# Patient Record
Sex: Male | Born: 1945
Health system: Southern US, Community
[De-identification: ages and names within clinical notes are randomized; demographics above are authoritative.]

## PROBLEM LIST (undated history)

## (undated) DIAGNOSIS — K589 Irritable bowel syndrome without diarrhea: Secondary | ICD-10-CM

## (undated) DIAGNOSIS — I10 Essential (primary) hypertension: Secondary | ICD-10-CM

## (undated) DIAGNOSIS — I2510 Atherosclerotic heart disease of native coronary artery without angina pectoris: Secondary | ICD-10-CM

## (undated) DIAGNOSIS — D509 Iron deficiency anemia, unspecified: Secondary | ICD-10-CM

## (undated) DIAGNOSIS — E079 Disorder of thyroid, unspecified: Secondary | ICD-10-CM

## (undated) DIAGNOSIS — K219 Gastro-esophageal reflux disease without esophagitis: Secondary | ICD-10-CM

## (undated) DIAGNOSIS — Z8612 Personal history of poliomyelitis: Secondary | ICD-10-CM

## (undated) DIAGNOSIS — M1990 Unspecified osteoarthritis, unspecified site: Secondary | ICD-10-CM

## (undated) DIAGNOSIS — E119 Type 2 diabetes mellitus without complications: Secondary | ICD-10-CM

## (undated) DIAGNOSIS — N289 Disorder of kidney and ureter, unspecified: Secondary | ICD-10-CM

## (undated) DIAGNOSIS — I219 Acute myocardial infarction, unspecified: Secondary | ICD-10-CM

## (undated) HISTORY — PX: HYDROCELE EXCISION / REPAIR: SUR1145

## (undated) HISTORY — PX: TONSILLECTOMY: SUR1361

## (undated) HISTORY — PX: CYSTOSCOPY WITH INSERTION OF UROLIFT: SHX6678

## (undated) HISTORY — DX: Iron deficiency anemia, unspecified: D50.9

## (undated) HISTORY — PX: JOINT REPLACEMENT: SHX530

## (undated) HISTORY — PX: BACK SURGERY: SHX140

## (undated) HISTORY — PX: HERNIA REPAIR: SHX51

---

## 1965-10-07 HISTORY — PX: BACK SURGERY: SHX140

## 2002-10-07 HISTORY — PX: OTHER SURGICAL HISTORY: SHX169

## 2005-10-04 HISTORY — PX: BACK SURGERY: SHX140

## 2006-10-07 HISTORY — PX: CORONARY ARTERY BYPASS GRAFT: SHX141

## 2007-11-12 ENCOUNTER — Encounter: Payer: Self-pay | Admitting: Cardiovascular Disease

## 2007-12-06 ENCOUNTER — Encounter: Payer: Self-pay | Admitting: Cardiovascular Disease

## 2008-01-06 ENCOUNTER — Encounter: Payer: Self-pay | Admitting: Cardiovascular Disease

## 2008-02-05 ENCOUNTER — Encounter: Payer: Self-pay | Admitting: Cardiovascular Disease

## 2010-04-07 ENCOUNTER — Ambulatory Visit: Payer: Self-pay | Admitting: Physician Assistant

## 2010-08-04 ENCOUNTER — Ambulatory Visit: Payer: Self-pay

## 2011-04-08 ENCOUNTER — Ambulatory Visit: Payer: Self-pay | Admitting: Family Medicine

## 2011-05-28 DIAGNOSIS — M545 Low back pain, unspecified: Secondary | ICD-10-CM | POA: Insufficient documentation

## 2011-05-28 DIAGNOSIS — E119 Type 2 diabetes mellitus without complications: Secondary | ICD-10-CM | POA: Insufficient documentation

## 2011-05-28 DIAGNOSIS — M1990 Unspecified osteoarthritis, unspecified site: Secondary | ICD-10-CM | POA: Insufficient documentation

## 2011-05-28 DIAGNOSIS — R918 Other nonspecific abnormal finding of lung field: Secondary | ICD-10-CM | POA: Insufficient documentation

## 2011-05-28 DIAGNOSIS — E1159 Type 2 diabetes mellitus with other circulatory complications: Secondary | ICD-10-CM | POA: Insufficient documentation

## 2011-05-28 DIAGNOSIS — E785 Hyperlipidemia, unspecified: Secondary | ICD-10-CM | POA: Insufficient documentation

## 2011-05-28 DIAGNOSIS — G5603 Carpal tunnel syndrome, bilateral upper limbs: Secondary | ICD-10-CM | POA: Insufficient documentation

## 2011-05-28 DIAGNOSIS — E039 Hypothyroidism, unspecified: Secondary | ICD-10-CM | POA: Insufficient documentation

## 2011-05-28 DIAGNOSIS — I10 Essential (primary) hypertension: Secondary | ICD-10-CM | POA: Insufficient documentation

## 2011-05-28 DIAGNOSIS — I2510 Atherosclerotic heart disease of native coronary artery without angina pectoris: Secondary | ICD-10-CM | POA: Insufficient documentation

## 2012-02-11 DIAGNOSIS — N529 Male erectile dysfunction, unspecified: Secondary | ICD-10-CM | POA: Insufficient documentation

## 2012-02-11 DIAGNOSIS — N400 Enlarged prostate without lower urinary tract symptoms: Secondary | ICD-10-CM | POA: Insufficient documentation

## 2012-02-24 DIAGNOSIS — M19049 Primary osteoarthritis, unspecified hand: Secondary | ICD-10-CM | POA: Insufficient documentation

## 2012-07-29 DIAGNOSIS — M47812 Spondylosis without myelopathy or radiculopathy, cervical region: Secondary | ICD-10-CM | POA: Insufficient documentation

## 2012-08-12 DIAGNOSIS — Z96659 Presence of unspecified artificial knee joint: Secondary | ICD-10-CM | POA: Insufficient documentation

## 2012-08-12 DIAGNOSIS — M1710 Unilateral primary osteoarthritis, unspecified knee: Secondary | ICD-10-CM | POA: Insufficient documentation

## 2013-01-29 ENCOUNTER — Ambulatory Visit: Payer: Self-pay | Admitting: Family Medicine

## 2013-04-30 ENCOUNTER — Ambulatory Visit: Payer: Self-pay | Admitting: Emergency Medicine

## 2013-06-16 DIAGNOSIS — K4090 Unilateral inguinal hernia, without obstruction or gangrene, not specified as recurrent: Secondary | ICD-10-CM | POA: Insufficient documentation

## 2013-07-16 DIAGNOSIS — E66811 Obesity, class 1: Secondary | ICD-10-CM | POA: Insufficient documentation

## 2013-07-16 DIAGNOSIS — E669 Obesity, unspecified: Secondary | ICD-10-CM | POA: Insufficient documentation

## 2013-07-16 DIAGNOSIS — E6609 Other obesity due to excess calories: Secondary | ICD-10-CM | POA: Insufficient documentation

## 2013-08-19 ENCOUNTER — Ambulatory Visit: Payer: Self-pay

## 2013-10-11 DIAGNOSIS — F4321 Adjustment disorder with depressed mood: Secondary | ICD-10-CM | POA: Diagnosis not present

## 2013-10-13 DIAGNOSIS — M79609 Pain in unspecified limb: Secondary | ICD-10-CM | POA: Diagnosis not present

## 2013-10-13 DIAGNOSIS — L03039 Cellulitis of unspecified toe: Secondary | ICD-10-CM | POA: Diagnosis not present

## 2013-10-18 DIAGNOSIS — F4321 Adjustment disorder with depressed mood: Secondary | ICD-10-CM | POA: Diagnosis not present

## 2013-10-27 DIAGNOSIS — L03039 Cellulitis of unspecified toe: Secondary | ICD-10-CM | POA: Diagnosis not present

## 2013-11-02 DIAGNOSIS — F4321 Adjustment disorder with depressed mood: Secondary | ICD-10-CM | POA: Diagnosis not present

## 2013-11-11 DIAGNOSIS — F4321 Adjustment disorder with depressed mood: Secondary | ICD-10-CM | POA: Diagnosis not present

## 2013-11-19 DIAGNOSIS — I10 Essential (primary) hypertension: Secondary | ICD-10-CM | POA: Diagnosis not present

## 2013-11-19 DIAGNOSIS — I2510 Atherosclerotic heart disease of native coronary artery without angina pectoris: Secondary | ICD-10-CM | POA: Diagnosis not present

## 2013-11-29 DIAGNOSIS — F4321 Adjustment disorder with depressed mood: Secondary | ICD-10-CM | POA: Diagnosis not present

## 2013-12-06 DIAGNOSIS — F4321 Adjustment disorder with depressed mood: Secondary | ICD-10-CM | POA: Diagnosis not present

## 2013-12-08 DIAGNOSIS — E119 Type 2 diabetes mellitus without complications: Secondary | ICD-10-CM | POA: Diagnosis not present

## 2013-12-08 DIAGNOSIS — K594 Anal spasm: Secondary | ICD-10-CM | POA: Diagnosis not present

## 2013-12-08 DIAGNOSIS — E039 Hypothyroidism, unspecified: Secondary | ICD-10-CM | POA: Diagnosis not present

## 2013-12-09 DIAGNOSIS — M7710 Lateral epicondylitis, unspecified elbow: Secondary | ICD-10-CM | POA: Diagnosis not present

## 2013-12-13 DIAGNOSIS — F4321 Adjustment disorder with depressed mood: Secondary | ICD-10-CM | POA: Diagnosis not present

## 2013-12-14 DIAGNOSIS — M25529 Pain in unspecified elbow: Secondary | ICD-10-CM | POA: Diagnosis not present

## 2013-12-16 DIAGNOSIS — F4321 Adjustment disorder with depressed mood: Secondary | ICD-10-CM | POA: Diagnosis not present

## 2013-12-20 DIAGNOSIS — M25529 Pain in unspecified elbow: Secondary | ICD-10-CM | POA: Diagnosis not present

## 2013-12-20 DIAGNOSIS — F4321 Adjustment disorder with depressed mood: Secondary | ICD-10-CM | POA: Diagnosis not present

## 2013-12-26 ENCOUNTER — Emergency Department: Payer: Self-pay | Admitting: Emergency Medicine

## 2013-12-26 ENCOUNTER — Ambulatory Visit: Payer: Self-pay

## 2013-12-26 DIAGNOSIS — E119 Type 2 diabetes mellitus without complications: Secondary | ICD-10-CM | POA: Diagnosis not present

## 2013-12-26 DIAGNOSIS — S0990XA Unspecified injury of head, initial encounter: Secondary | ICD-10-CM | POA: Diagnosis not present

## 2013-12-26 DIAGNOSIS — S06330A Contusion and laceration of cerebrum, unspecified, without loss of consciousness, initial encounter: Secondary | ICD-10-CM | POA: Diagnosis not present

## 2013-12-26 DIAGNOSIS — I10 Essential (primary) hypertension: Secondary | ICD-10-CM | POA: Diagnosis not present

## 2013-12-26 DIAGNOSIS — Z79899 Other long term (current) drug therapy: Secondary | ICD-10-CM | POA: Diagnosis not present

## 2013-12-26 DIAGNOSIS — J069 Acute upper respiratory infection, unspecified: Secondary | ICD-10-CM | POA: Diagnosis not present

## 2013-12-29 DIAGNOSIS — R141 Gas pain: Secondary | ICD-10-CM | POA: Diagnosis not present

## 2013-12-29 DIAGNOSIS — K594 Anal spasm: Secondary | ICD-10-CM | POA: Diagnosis not present

## 2013-12-29 DIAGNOSIS — R143 Flatulence: Secondary | ICD-10-CM | POA: Diagnosis not present

## 2013-12-29 DIAGNOSIS — F4321 Adjustment disorder with depressed mood: Secondary | ICD-10-CM | POA: Diagnosis not present

## 2014-01-03 DIAGNOSIS — F4321 Adjustment disorder with depressed mood: Secondary | ICD-10-CM | POA: Diagnosis not present

## 2014-01-06 DIAGNOSIS — R7309 Other abnormal glucose: Secondary | ICD-10-CM | POA: Diagnosis not present

## 2014-01-06 DIAGNOSIS — E039 Hypothyroidism, unspecified: Secondary | ICD-10-CM | POA: Diagnosis not present

## 2014-01-06 DIAGNOSIS — E119 Type 2 diabetes mellitus without complications: Secondary | ICD-10-CM | POA: Diagnosis not present

## 2014-01-13 DIAGNOSIS — F4321 Adjustment disorder with depressed mood: Secondary | ICD-10-CM | POA: Diagnosis not present

## 2014-01-17 DIAGNOSIS — F4321 Adjustment disorder with depressed mood: Secondary | ICD-10-CM | POA: Diagnosis not present

## 2014-01-24 DIAGNOSIS — F4321 Adjustment disorder with depressed mood: Secondary | ICD-10-CM | POA: Diagnosis not present

## 2014-01-28 DIAGNOSIS — R109 Unspecified abdominal pain: Secondary | ICD-10-CM | POA: Diagnosis not present

## 2014-01-28 DIAGNOSIS — B351 Tinea unguium: Secondary | ICD-10-CM | POA: Diagnosis not present

## 2014-01-31 DIAGNOSIS — F4321 Adjustment disorder with depressed mood: Secondary | ICD-10-CM | POA: Diagnosis not present

## 2014-02-08 DIAGNOSIS — R141 Gas pain: Secondary | ICD-10-CM | POA: Diagnosis not present

## 2014-02-08 DIAGNOSIS — R142 Eructation: Secondary | ICD-10-CM | POA: Diagnosis not present

## 2014-02-14 DIAGNOSIS — R141 Gas pain: Secondary | ICD-10-CM | POA: Diagnosis not present

## 2014-02-14 DIAGNOSIS — F4321 Adjustment disorder with depressed mood: Secondary | ICD-10-CM | POA: Diagnosis not present

## 2014-02-15 DIAGNOSIS — E119 Type 2 diabetes mellitus without complications: Secondary | ICD-10-CM | POA: Diagnosis not present

## 2014-02-15 DIAGNOSIS — H2510 Age-related nuclear cataract, unspecified eye: Secondary | ICD-10-CM | POA: Diagnosis not present

## 2014-02-21 DIAGNOSIS — F4321 Adjustment disorder with depressed mood: Secondary | ICD-10-CM | POA: Diagnosis not present

## 2014-02-23 DIAGNOSIS — A048 Other specified bacterial intestinal infections: Secondary | ICD-10-CM | POA: Diagnosis not present

## 2014-02-23 DIAGNOSIS — H0289 Other specified disorders of eyelid: Secondary | ICD-10-CM | POA: Diagnosis not present

## 2014-02-23 DIAGNOSIS — E119 Type 2 diabetes mellitus without complications: Secondary | ICD-10-CM | POA: Diagnosis not present

## 2014-03-07 DIAGNOSIS — F4321 Adjustment disorder with depressed mood: Secondary | ICD-10-CM | POA: Diagnosis not present

## 2014-03-14 DIAGNOSIS — F4321 Adjustment disorder with depressed mood: Secondary | ICD-10-CM | POA: Diagnosis not present

## 2014-03-18 DIAGNOSIS — B351 Tinea unguium: Secondary | ICD-10-CM | POA: Diagnosis not present

## 2014-03-18 DIAGNOSIS — L218 Other seborrheic dermatitis: Secondary | ICD-10-CM | POA: Diagnosis not present

## 2014-03-18 DIAGNOSIS — E119 Type 2 diabetes mellitus without complications: Secondary | ICD-10-CM | POA: Diagnosis not present

## 2014-03-24 DIAGNOSIS — F4321 Adjustment disorder with depressed mood: Secondary | ICD-10-CM | POA: Diagnosis not present

## 2014-03-28 DIAGNOSIS — F4321 Adjustment disorder with depressed mood: Secondary | ICD-10-CM | POA: Diagnosis not present

## 2014-04-04 DIAGNOSIS — F4321 Adjustment disorder with depressed mood: Secondary | ICD-10-CM | POA: Diagnosis not present

## 2014-04-11 DIAGNOSIS — F4321 Adjustment disorder with depressed mood: Secondary | ICD-10-CM | POA: Diagnosis not present

## 2014-04-19 DIAGNOSIS — M545 Low back pain, unspecified: Secondary | ICD-10-CM | POA: Diagnosis not present

## 2014-04-19 DIAGNOSIS — R52 Pain, unspecified: Secondary | ICD-10-CM | POA: Diagnosis not present

## 2014-04-19 DIAGNOSIS — M546 Pain in thoracic spine: Secondary | ICD-10-CM | POA: Diagnosis not present

## 2014-04-19 DIAGNOSIS — M4120 Other idiopathic scoliosis, site unspecified: Secondary | ICD-10-CM | POA: Diagnosis not present

## 2014-04-19 DIAGNOSIS — M47812 Spondylosis without myelopathy or radiculopathy, cervical region: Secondary | ICD-10-CM | POA: Diagnosis not present

## 2014-04-19 DIAGNOSIS — M542 Cervicalgia: Secondary | ICD-10-CM | POA: Diagnosis not present

## 2014-04-25 DIAGNOSIS — F4321 Adjustment disorder with depressed mood: Secondary | ICD-10-CM | POA: Diagnosis not present

## 2014-04-29 ENCOUNTER — Ambulatory Visit: Payer: Self-pay | Admitting: Physician Assistant

## 2014-04-29 DIAGNOSIS — M1990 Unspecified osteoarthritis, unspecified site: Secondary | ICD-10-CM | POA: Diagnosis not present

## 2014-04-29 DIAGNOSIS — I2510 Atherosclerotic heart disease of native coronary artery without angina pectoris: Secondary | ICD-10-CM | POA: Diagnosis not present

## 2014-04-29 DIAGNOSIS — M7989 Other specified soft tissue disorders: Secondary | ICD-10-CM | POA: Diagnosis not present

## 2014-04-29 DIAGNOSIS — E039 Hypothyroidism, unspecified: Secondary | ICD-10-CM | POA: Diagnosis not present

## 2014-04-29 DIAGNOSIS — E669 Obesity, unspecified: Secondary | ICD-10-CM | POA: Diagnosis not present

## 2014-04-29 DIAGNOSIS — Z79899 Other long term (current) drug therapy: Secondary | ICD-10-CM | POA: Diagnosis not present

## 2014-04-29 DIAGNOSIS — E785 Hyperlipidemia, unspecified: Secondary | ICD-10-CM | POA: Diagnosis not present

## 2014-04-29 DIAGNOSIS — R609 Edema, unspecified: Secondary | ICD-10-CM | POA: Diagnosis not present

## 2014-04-29 DIAGNOSIS — D649 Anemia, unspecified: Secondary | ICD-10-CM | POA: Diagnosis not present

## 2014-04-29 DIAGNOSIS — R0602 Shortness of breath: Secondary | ICD-10-CM | POA: Diagnosis not present

## 2014-04-29 LAB — CBC WITH DIFFERENTIAL/PLATELET
Basophil #: 0.1 10*3/uL (ref 0.0–0.1)
Basophil %: 1.1 %
Eosinophil #: 0.4 10*3/uL (ref 0.0–0.7)
Eosinophil %: 8.1 %
HCT: 34.2 % — ABNORMAL LOW (ref 40.0–52.0)
HGB: 11.2 g/dL — AB (ref 13.0–18.0)
Lymphocyte #: 1.3 10*3/uL (ref 1.0–3.6)
Lymphocyte %: 25.5 %
MCH: 26.9 pg (ref 26.0–34.0)
MCHC: 32.7 g/dL (ref 32.0–36.0)
MCV: 82 fL (ref 80–100)
MONOS PCT: 7.2 %
Monocyte #: 0.4 x10 3/mm (ref 0.2–1.0)
Neutrophil #: 3.1 10*3/uL (ref 1.4–6.5)
Neutrophil %: 58.1 %
Platelet: 209 10*3/uL (ref 150–440)
RBC: 4.15 10*6/uL — ABNORMAL LOW (ref 4.40–5.90)
RDW: 16.4 % — AB (ref 11.5–14.5)
WBC: 5.3 10*3/uL (ref 3.8–10.6)

## 2014-04-29 LAB — BASIC METABOLIC PANEL
ANION GAP: 9 (ref 7–16)
BUN: 16 mg/dL (ref 7–18)
CALCIUM: 9.2 mg/dL (ref 8.5–10.1)
CO2: 27 mmol/L (ref 21–32)
CREATININE: 1.14 mg/dL (ref 0.60–1.30)
Chloride: 103 mmol/L (ref 98–107)
EGFR (African American): >60
EGFR (Non-African Amer.): >60
Glucose: 111 mg/dL — ABNORMAL HIGH (ref 65–99)
Osmolality: 279 (ref 275–301)
Potassium: 4.2 mmol/L (ref 3.5–5.1)
SODIUM: 139 mmol/L (ref 136–145)

## 2014-04-29 LAB — URINALYSIS, COMPLETE
Bacteria: NEGATIVE
Bilirubin,UR: NEGATIVE
Blood: NEGATIVE
Glucose,UR: NEGATIVE mg/dL (ref 0–75)
KETONE: NEGATIVE
NITRITE: NEGATIVE
Ph: 8 (ref 4.5–8.0)
Protein: NEGATIVE
SPECIFIC GRAVITY: 1.015 (ref 1.003–1.030)

## 2014-04-29 LAB — T4, FREE: Free Thyroxine: 0.91 ng/dL (ref 0.76–1.46)

## 2014-04-29 LAB — PRO B NATRIURETIC PEPTIDE: B-TYPE NATIURETIC PEPTID: 159 pg/mL — AB (ref 0–125)

## 2014-04-29 LAB — TSH: Thyroid Stimulating Horm: 4.84 u[IU]/mL — ABNORMAL HIGH

## 2014-05-02 DIAGNOSIS — M549 Dorsalgia, unspecified: Secondary | ICD-10-CM | POA: Diagnosis not present

## 2014-05-02 DIAGNOSIS — F4321 Adjustment disorder with depressed mood: Secondary | ICD-10-CM | POA: Diagnosis not present

## 2014-05-02 DIAGNOSIS — M47812 Spondylosis without myelopathy or radiculopathy, cervical region: Secondary | ICD-10-CM | POA: Diagnosis not present

## 2014-05-04 DIAGNOSIS — Z23 Encounter for immunization: Secondary | ICD-10-CM | POA: Diagnosis not present

## 2014-05-04 DIAGNOSIS — E039 Hypothyroidism, unspecified: Secondary | ICD-10-CM | POA: Diagnosis not present

## 2014-05-04 DIAGNOSIS — R609 Edema, unspecified: Secondary | ICD-10-CM | POA: Diagnosis not present

## 2014-05-04 DIAGNOSIS — E119 Type 2 diabetes mellitus without complications: Secondary | ICD-10-CM | POA: Diagnosis not present

## 2014-05-04 DIAGNOSIS — T148XXA Other injury of unspecified body region, initial encounter: Secondary | ICD-10-CM | POA: Diagnosis not present

## 2014-05-09 DIAGNOSIS — F4321 Adjustment disorder with depressed mood: Secondary | ICD-10-CM | POA: Diagnosis not present

## 2014-05-11 DIAGNOSIS — M4712 Other spondylosis with myelopathy, cervical region: Secondary | ICD-10-CM | POA: Diagnosis not present

## 2014-05-16 DIAGNOSIS — F4321 Adjustment disorder with depressed mood: Secondary | ICD-10-CM | POA: Diagnosis not present

## 2014-05-23 DIAGNOSIS — I2510 Atherosclerotic heart disease of native coronary artery without angina pectoris: Secondary | ICD-10-CM | POA: Diagnosis not present

## 2014-05-23 DIAGNOSIS — I10 Essential (primary) hypertension: Secondary | ICD-10-CM | POA: Diagnosis not present

## 2014-05-23 DIAGNOSIS — F4321 Adjustment disorder with depressed mood: Secondary | ICD-10-CM | POA: Diagnosis not present

## 2014-05-30 DIAGNOSIS — F4321 Adjustment disorder with depressed mood: Secondary | ICD-10-CM | POA: Diagnosis not present

## 2014-06-06 DIAGNOSIS — F4321 Adjustment disorder with depressed mood: Secondary | ICD-10-CM | POA: Diagnosis not present

## 2014-06-20 DIAGNOSIS — F4321 Adjustment disorder with depressed mood: Secondary | ICD-10-CM | POA: Diagnosis not present

## 2014-06-22 DIAGNOSIS — M79609 Pain in unspecified limb: Secondary | ICD-10-CM | POA: Diagnosis not present

## 2014-06-22 DIAGNOSIS — E119 Type 2 diabetes mellitus without complications: Secondary | ICD-10-CM | POA: Diagnosis not present

## 2014-06-22 DIAGNOSIS — L03039 Cellulitis of unspecified toe: Secondary | ICD-10-CM | POA: Diagnosis not present

## 2014-06-22 DIAGNOSIS — B351 Tinea unguium: Secondary | ICD-10-CM | POA: Diagnosis not present

## 2014-06-29 DIAGNOSIS — K648 Other hemorrhoids: Secondary | ICD-10-CM | POA: Diagnosis not present

## 2014-06-29 DIAGNOSIS — Z1211 Encounter for screening for malignant neoplasm of colon: Secondary | ICD-10-CM | POA: Diagnosis not present

## 2014-06-29 DIAGNOSIS — Z79899 Other long term (current) drug therapy: Secondary | ICD-10-CM | POA: Diagnosis not present

## 2014-06-29 DIAGNOSIS — D126 Benign neoplasm of colon, unspecified: Secondary | ICD-10-CM | POA: Diagnosis not present

## 2014-06-29 DIAGNOSIS — K5730 Diverticulosis of large intestine without perforation or abscess without bleeding: Secondary | ICD-10-CM | POA: Diagnosis not present

## 2014-06-29 DIAGNOSIS — I10 Essential (primary) hypertension: Secondary | ICD-10-CM | POA: Diagnosis not present

## 2014-06-29 DIAGNOSIS — K6289 Other specified diseases of anus and rectum: Secondary | ICD-10-CM | POA: Diagnosis not present

## 2014-06-29 DIAGNOSIS — K594 Anal spasm: Secondary | ICD-10-CM | POA: Diagnosis not present

## 2014-06-29 DIAGNOSIS — E119 Type 2 diabetes mellitus without complications: Secondary | ICD-10-CM | POA: Diagnosis not present

## 2014-06-30 DIAGNOSIS — F4321 Adjustment disorder with depressed mood: Secondary | ICD-10-CM | POA: Diagnosis not present

## 2014-07-18 DIAGNOSIS — F4321 Adjustment disorder with depressed mood: Secondary | ICD-10-CM | POA: Diagnosis not present

## 2014-07-25 DIAGNOSIS — F4321 Adjustment disorder with depressed mood: Secondary | ICD-10-CM | POA: Diagnosis not present

## 2014-08-01 DIAGNOSIS — F4321 Adjustment disorder with depressed mood: Secondary | ICD-10-CM | POA: Diagnosis not present

## 2014-08-05 DIAGNOSIS — M542 Cervicalgia: Secondary | ICD-10-CM | POA: Diagnosis not present

## 2014-08-08 DIAGNOSIS — I10 Essential (primary) hypertension: Secondary | ICD-10-CM | POA: Diagnosis not present

## 2014-08-08 DIAGNOSIS — F4321 Adjustment disorder with depressed mood: Secondary | ICD-10-CM | POA: Diagnosis not present

## 2014-08-08 DIAGNOSIS — B009 Herpesviral infection, unspecified: Secondary | ICD-10-CM | POA: Diagnosis not present

## 2014-08-08 DIAGNOSIS — Z23 Encounter for immunization: Secondary | ICD-10-CM | POA: Diagnosis not present

## 2014-08-15 DIAGNOSIS — F4321 Adjustment disorder with depressed mood: Secondary | ICD-10-CM | POA: Diagnosis not present

## 2014-08-15 DIAGNOSIS — M47816 Spondylosis without myelopathy or radiculopathy, lumbar region: Secondary | ICD-10-CM | POA: Diagnosis not present

## 2014-08-15 DIAGNOSIS — M47812 Spondylosis without myelopathy or radiculopathy, cervical region: Secondary | ICD-10-CM | POA: Diagnosis not present

## 2014-08-17 DIAGNOSIS — E031 Congenital hypothyroidism without goiter: Secondary | ICD-10-CM | POA: Diagnosis not present

## 2014-08-17 DIAGNOSIS — E1165 Type 2 diabetes mellitus with hyperglycemia: Secondary | ICD-10-CM | POA: Diagnosis not present

## 2014-08-17 DIAGNOSIS — Z794 Long term (current) use of insulin: Secondary | ICD-10-CM | POA: Diagnosis not present

## 2014-08-17 DIAGNOSIS — Z79899 Other long term (current) drug therapy: Secondary | ICD-10-CM | POA: Diagnosis not present

## 2014-08-17 DIAGNOSIS — I10 Essential (primary) hypertension: Secondary | ICD-10-CM | POA: Diagnosis not present

## 2014-08-17 DIAGNOSIS — E119 Type 2 diabetes mellitus without complications: Secondary | ICD-10-CM | POA: Diagnosis not present

## 2014-08-17 DIAGNOSIS — E039 Hypothyroidism, unspecified: Secondary | ICD-10-CM | POA: Diagnosis not present

## 2014-08-17 DIAGNOSIS — E785 Hyperlipidemia, unspecified: Secondary | ICD-10-CM | POA: Diagnosis not present

## 2014-08-18 DIAGNOSIS — E7849 Other hyperlipidemia: Secondary | ICD-10-CM | POA: Insufficient documentation

## 2014-08-22 DIAGNOSIS — F4321 Adjustment disorder with depressed mood: Secondary | ICD-10-CM | POA: Diagnosis not present

## 2014-08-29 DIAGNOSIS — F4321 Adjustment disorder with depressed mood: Secondary | ICD-10-CM | POA: Diagnosis not present

## 2014-08-31 DIAGNOSIS — M542 Cervicalgia: Secondary | ICD-10-CM | POA: Diagnosis not present

## 2014-09-05 DIAGNOSIS — F4321 Adjustment disorder with depressed mood: Secondary | ICD-10-CM | POA: Diagnosis not present

## 2014-09-12 DIAGNOSIS — F4321 Adjustment disorder with depressed mood: Secondary | ICD-10-CM | POA: Diagnosis not present

## 2014-09-14 DIAGNOSIS — M542 Cervicalgia: Secondary | ICD-10-CM | POA: Diagnosis not present

## 2014-09-26 DIAGNOSIS — F4321 Adjustment disorder with depressed mood: Secondary | ICD-10-CM | POA: Diagnosis not present

## 2014-09-28 DIAGNOSIS — M542 Cervicalgia: Secondary | ICD-10-CM | POA: Diagnosis not present

## 2014-10-03 DIAGNOSIS — F4321 Adjustment disorder with depressed mood: Secondary | ICD-10-CM | POA: Diagnosis not present

## 2014-10-10 DIAGNOSIS — F4321 Adjustment disorder with depressed mood: Secondary | ICD-10-CM | POA: Diagnosis not present

## 2014-10-17 DIAGNOSIS — F4321 Adjustment disorder with depressed mood: Secondary | ICD-10-CM | POA: Diagnosis not present

## 2014-11-03 DIAGNOSIS — F4321 Adjustment disorder with depressed mood: Secondary | ICD-10-CM | POA: Diagnosis not present

## 2014-11-09 DIAGNOSIS — F4321 Adjustment disorder with depressed mood: Secondary | ICD-10-CM | POA: Diagnosis not present

## 2014-11-14 IMAGING — CT CT HEAD WITHOUT CONTRAST
2 series · 13 of 30 positions shown, 15 images · non-contrast
Comparison: None.

CLINICAL DATA: Fall

EXAM:
CT HEAD WITHOUT CONTRAST
TECHNIQUE: Contiguous axial images were obtained from the base of the skull
through the vertex without intravenous contrast.

[Series 2: head wo · axial · 0.43mm/px · z∈[-121,-27]mm · 5 of 33 slices shown, 7 images]
[im 6/33  brain]
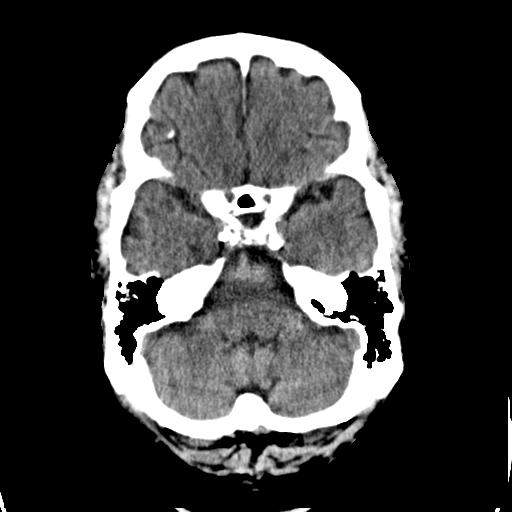
[im 6/33  bone]
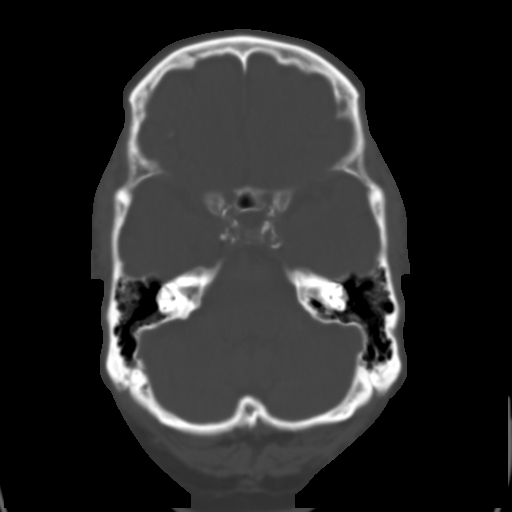
[im 11/33  brain]
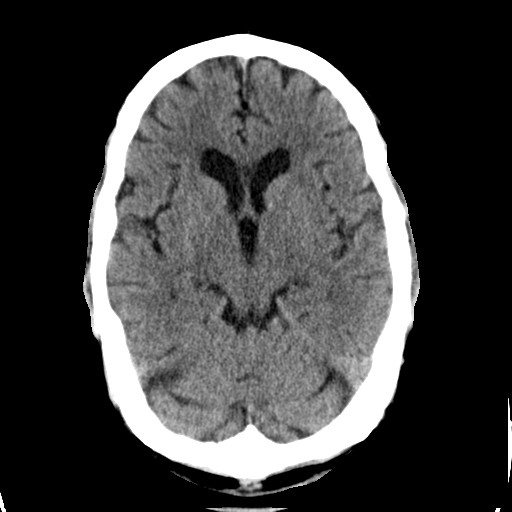
[im 17/33  brain]
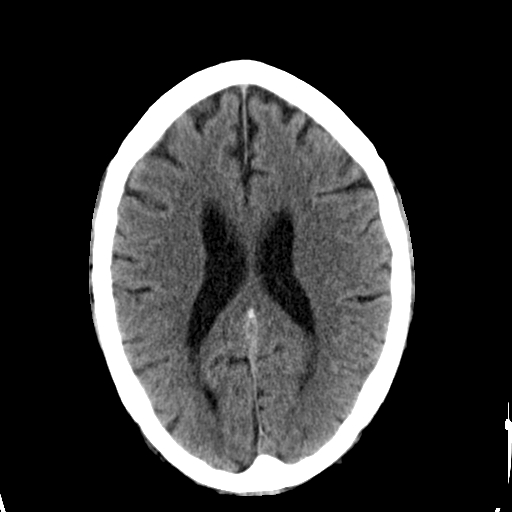
[im 22/33  brain]
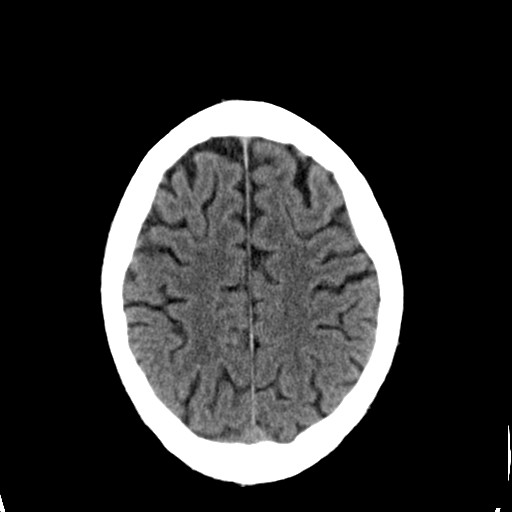
[im 27/33  brain]
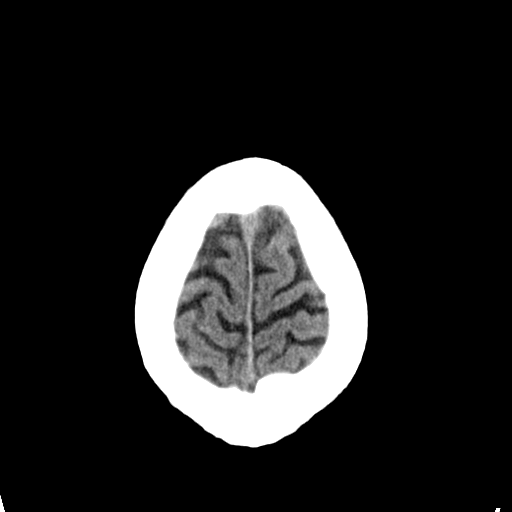
[im 27/33  bone]
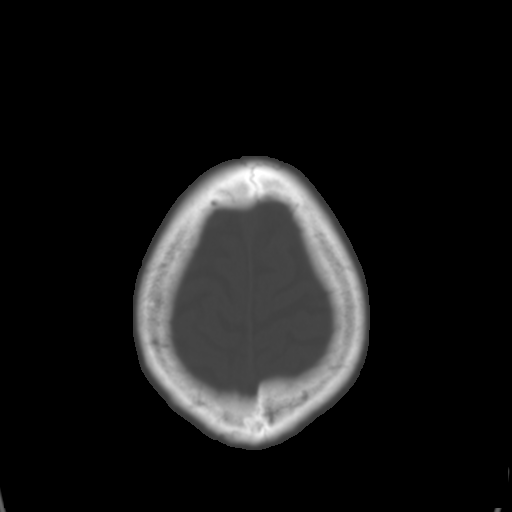

[Series 3: head bone · axial · 0.43mm/px · z∈[-131,-9]mm · 8 of 102 slices shown]
[im 10/102  bone]
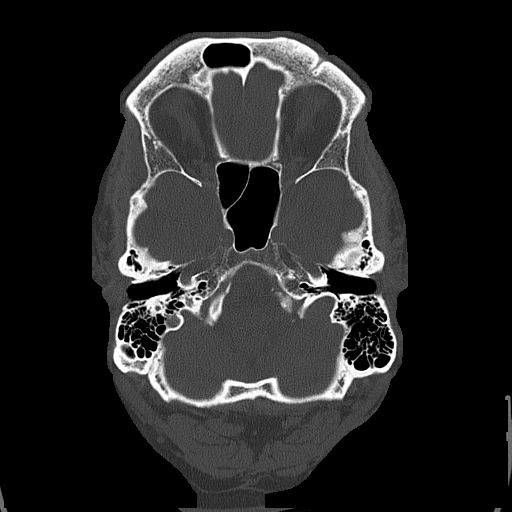
[im 19/102  bone]
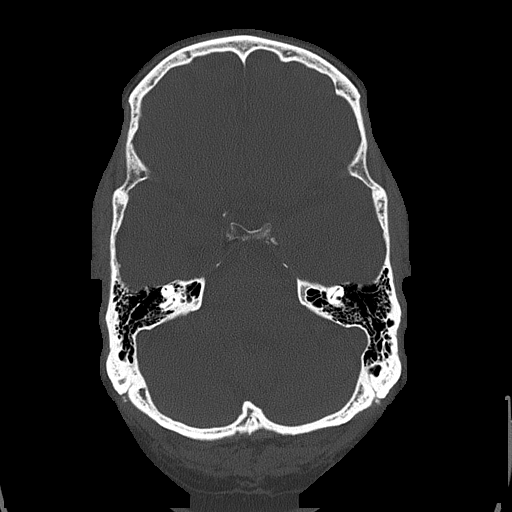
[im 33/102  bone]
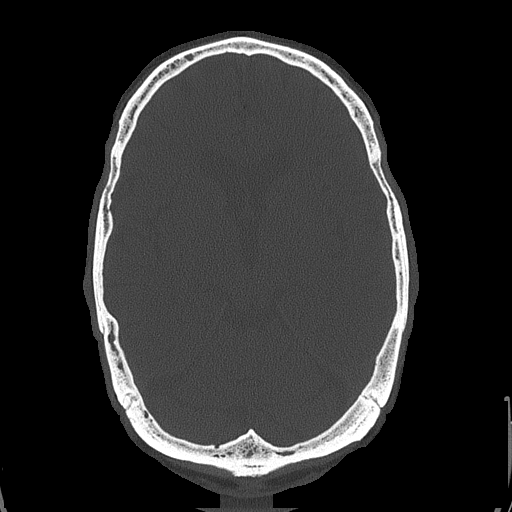
[im 46/102  bone]
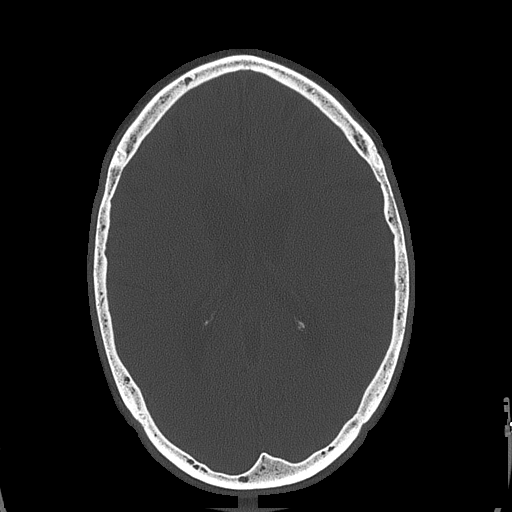
[im 56/102  bone]
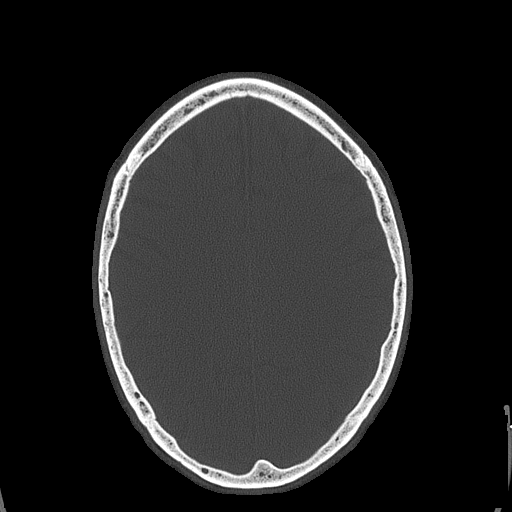
[im 69/102  bone]
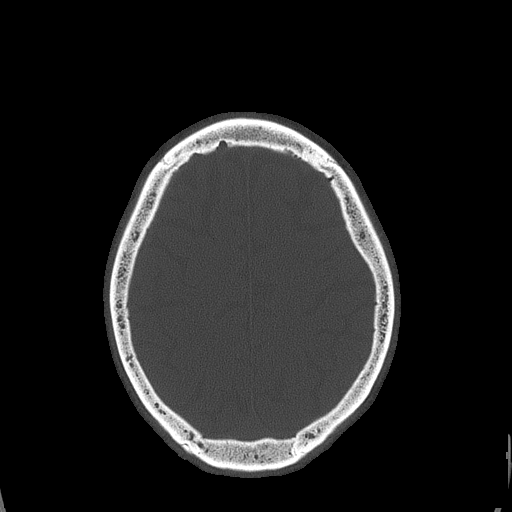
[im 83/102  bone]
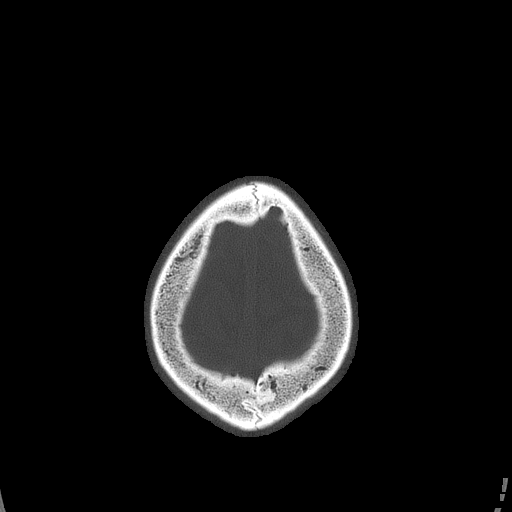
[im 92/102  bone]
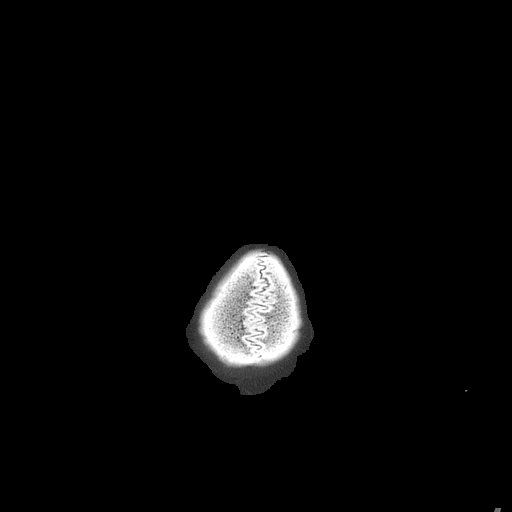

[13 of 30 positions shown; findings below may reference images not displayed]

FINDINGS: Global atrophy appropriate for age. Mild chronic ischemic changes in
the periventricular white matter and external capsules. No mass
effect, midline shift, or acute intracranial hemorrhage. Cranium is
intact. Mucosal thickening in the ethmoid air cells is noted.
IMPRESSION: No acute intracranial pathology.  Chronic changes are noted.

## 2014-11-16 DIAGNOSIS — F4321 Adjustment disorder with depressed mood: Secondary | ICD-10-CM | POA: Diagnosis not present

## 2014-11-21 DIAGNOSIS — Z87891 Personal history of nicotine dependence: Secondary | ICD-10-CM | POA: Diagnosis not present

## 2014-11-21 DIAGNOSIS — E1165 Type 2 diabetes mellitus with hyperglycemia: Secondary | ICD-10-CM | POA: Diagnosis not present

## 2014-11-21 DIAGNOSIS — Z79899 Other long term (current) drug therapy: Secondary | ICD-10-CM | POA: Diagnosis not present

## 2014-11-21 DIAGNOSIS — Z7982 Long term (current) use of aspirin: Secondary | ICD-10-CM | POA: Diagnosis not present

## 2014-11-23 DIAGNOSIS — E785 Hyperlipidemia, unspecified: Secondary | ICD-10-CM | POA: Diagnosis not present

## 2014-11-23 DIAGNOSIS — I2510 Atherosclerotic heart disease of native coronary artery without angina pectoris: Secondary | ICD-10-CM | POA: Diagnosis not present

## 2014-11-23 DIAGNOSIS — R5383 Other fatigue: Secondary | ICD-10-CM | POA: Diagnosis not present

## 2014-11-30 DIAGNOSIS — R05 Cough: Secondary | ICD-10-CM | POA: Diagnosis not present

## 2014-12-01 DIAGNOSIS — R1031 Right lower quadrant pain: Secondary | ICD-10-CM | POA: Diagnosis not present

## 2014-12-01 DIAGNOSIS — K6389 Other specified diseases of intestine: Secondary | ICD-10-CM | POA: Diagnosis not present

## 2014-12-01 DIAGNOSIS — R140 Abdominal distension (gaseous): Secondary | ICD-10-CM | POA: Diagnosis not present

## 2014-12-05 DIAGNOSIS — F4321 Adjustment disorder with depressed mood: Secondary | ICD-10-CM | POA: Diagnosis not present

## 2014-12-12 DIAGNOSIS — F4321 Adjustment disorder with depressed mood: Secondary | ICD-10-CM | POA: Diagnosis not present

## 2014-12-16 DIAGNOSIS — R140 Abdominal distension (gaseous): Secondary | ICD-10-CM | POA: Diagnosis not present

## 2014-12-16 DIAGNOSIS — K8020 Calculus of gallbladder without cholecystitis without obstruction: Secondary | ICD-10-CM | POA: Diagnosis not present

## 2014-12-16 DIAGNOSIS — R1031 Right lower quadrant pain: Secondary | ICD-10-CM | POA: Diagnosis not present

## 2014-12-19 DIAGNOSIS — F4321 Adjustment disorder with depressed mood: Secondary | ICD-10-CM | POA: Diagnosis not present

## 2014-12-30 DIAGNOSIS — F4321 Adjustment disorder with depressed mood: Secondary | ICD-10-CM | POA: Diagnosis not present

## 2015-01-02 DIAGNOSIS — F4321 Adjustment disorder with depressed mood: Secondary | ICD-10-CM | POA: Diagnosis not present

## 2015-01-05 ENCOUNTER — Ambulatory Visit
Admit: 2015-01-05 | Disposition: A | Discharge status: Home / Self Care | Payer: Self-pay | Attending: Family Medicine | Admitting: Family Medicine

## 2015-01-05 DIAGNOSIS — E780 Pure hypercholesterolemia: Secondary | ICD-10-CM | POA: Diagnosis not present

## 2015-01-05 DIAGNOSIS — M19042 Primary osteoarthritis, left hand: Secondary | ICD-10-CM | POA: Diagnosis not present

## 2015-01-05 DIAGNOSIS — E119 Type 2 diabetes mellitus without complications: Secondary | ICD-10-CM | POA: Diagnosis not present

## 2015-01-05 DIAGNOSIS — W208XXA Other cause of strike by thrown, projected or falling object, initial encounter: Secondary | ICD-10-CM | POA: Diagnosis not present

## 2015-01-05 DIAGNOSIS — Z951 Presence of aortocoronary bypass graft: Secondary | ICD-10-CM | POA: Diagnosis not present

## 2015-01-05 DIAGNOSIS — S3993XA Unspecified injury of pelvis, initial encounter: Secondary | ICD-10-CM | POA: Diagnosis not present

## 2015-01-05 DIAGNOSIS — Z87891 Personal history of nicotine dependence: Secondary | ICD-10-CM | POA: Diagnosis not present

## 2015-01-05 DIAGNOSIS — S300XXA Contusion of lower back and pelvis, initial encounter: Secondary | ICD-10-CM | POA: Diagnosis not present

## 2015-01-05 DIAGNOSIS — R079 Chest pain, unspecified: Secondary | ICD-10-CM | POA: Diagnosis not present

## 2015-01-05 DIAGNOSIS — Z794 Long term (current) use of insulin: Secondary | ICD-10-CM | POA: Diagnosis not present

## 2015-01-05 DIAGNOSIS — K219 Gastro-esophageal reflux disease without esophagitis: Secondary | ICD-10-CM | POA: Diagnosis not present

## 2015-01-05 DIAGNOSIS — M19041 Primary osteoarthritis, right hand: Secondary | ICD-10-CM | POA: Diagnosis not present

## 2015-01-05 DIAGNOSIS — Z981 Arthrodesis status: Secondary | ICD-10-CM | POA: Diagnosis not present

## 2015-01-05 DIAGNOSIS — G629 Polyneuropathy, unspecified: Secondary | ICD-10-CM | POA: Diagnosis not present

## 2015-01-05 DIAGNOSIS — S60929A Unspecified superficial injury of unspecified hand, initial encounter: Secondary | ICD-10-CM | POA: Diagnosis not present

## 2015-01-05 DIAGNOSIS — S20409A Unspecified superficial injuries of unspecified back wall of thorax, initial encounter: Secondary | ICD-10-CM | POA: Diagnosis not present

## 2015-01-05 DIAGNOSIS — Z79899 Other long term (current) drug therapy: Secondary | ICD-10-CM | POA: Diagnosis not present

## 2015-01-05 DIAGNOSIS — E079 Disorder of thyroid, unspecified: Secondary | ICD-10-CM | POA: Diagnosis not present

## 2015-01-05 DIAGNOSIS — S6992XA Unspecified injury of left wrist, hand and finger(s), initial encounter: Secondary | ICD-10-CM | POA: Diagnosis not present

## 2015-01-05 DIAGNOSIS — W19XXXA Unspecified fall, initial encounter: Secondary | ICD-10-CM | POA: Diagnosis not present

## 2015-01-05 DIAGNOSIS — S3992XA Unspecified injury of lower back, initial encounter: Secondary | ICD-10-CM | POA: Diagnosis not present

## 2015-01-05 DIAGNOSIS — M545 Low back pain: Secondary | ICD-10-CM | POA: Diagnosis not present

## 2015-01-05 DIAGNOSIS — S6991XA Unspecified injury of right wrist, hand and finger(s), initial encounter: Secondary | ICD-10-CM | POA: Diagnosis not present

## 2015-01-20 DIAGNOSIS — F4321 Adjustment disorder with depressed mood: Secondary | ICD-10-CM | POA: Diagnosis not present

## 2015-01-27 DIAGNOSIS — F4321 Adjustment disorder with depressed mood: Secondary | ICD-10-CM | POA: Diagnosis not present

## 2015-02-03 DIAGNOSIS — F4321 Adjustment disorder with depressed mood: Secondary | ICD-10-CM | POA: Diagnosis not present

## 2015-02-14 DIAGNOSIS — Z87448 Personal history of other diseases of urinary system: Secondary | ICD-10-CM | POA: Insufficient documentation

## 2015-02-14 DIAGNOSIS — N183 Chronic kidney disease, stage 3 (moderate): Secondary | ICD-10-CM | POA: Diagnosis not present

## 2015-02-14 DIAGNOSIS — F4321 Adjustment disorder with depressed mood: Secondary | ICD-10-CM | POA: Diagnosis not present

## 2015-02-14 DIAGNOSIS — I10 Essential (primary) hypertension: Secondary | ICD-10-CM | POA: Diagnosis not present

## 2015-02-14 DIAGNOSIS — M25552 Pain in left hip: Secondary | ICD-10-CM | POA: Diagnosis not present

## 2015-02-14 DIAGNOSIS — E039 Hypothyroidism, unspecified: Secondary | ICD-10-CM | POA: Diagnosis not present

## 2015-02-20 DIAGNOSIS — Z7982 Long term (current) use of aspirin: Secondary | ICD-10-CM | POA: Diagnosis not present

## 2015-02-20 DIAGNOSIS — E039 Hypothyroidism, unspecified: Secondary | ICD-10-CM | POA: Diagnosis not present

## 2015-02-20 DIAGNOSIS — E1165 Type 2 diabetes mellitus with hyperglycemia: Secondary | ICD-10-CM | POA: Diagnosis not present

## 2015-02-20 DIAGNOSIS — I252 Old myocardial infarction: Secondary | ICD-10-CM | POA: Diagnosis not present

## 2015-02-20 DIAGNOSIS — Z79899 Other long term (current) drug therapy: Secondary | ICD-10-CM | POA: Diagnosis not present

## 2015-02-20 DIAGNOSIS — E119 Type 2 diabetes mellitus without complications: Secondary | ICD-10-CM | POA: Diagnosis not present

## 2015-02-20 DIAGNOSIS — Z951 Presence of aortocoronary bypass graft: Secondary | ICD-10-CM | POA: Diagnosis not present

## 2015-02-20 DIAGNOSIS — Z87891 Personal history of nicotine dependence: Secondary | ICD-10-CM | POA: Diagnosis not present

## 2015-02-20 DIAGNOSIS — E785 Hyperlipidemia, unspecified: Secondary | ICD-10-CM | POA: Diagnosis not present

## 2015-02-20 DIAGNOSIS — I10 Essential (primary) hypertension: Secondary | ICD-10-CM | POA: Diagnosis not present

## 2015-02-20 DIAGNOSIS — Z794 Long term (current) use of insulin: Secondary | ICD-10-CM | POA: Diagnosis not present

## 2015-02-24 DIAGNOSIS — M25552 Pain in left hip: Secondary | ICD-10-CM | POA: Diagnosis not present

## 2015-02-24 DIAGNOSIS — M549 Dorsalgia, unspecified: Secondary | ICD-10-CM | POA: Diagnosis not present

## 2015-02-24 DIAGNOSIS — M47816 Spondylosis without myelopathy or radiculopathy, lumbar region: Secondary | ICD-10-CM | POA: Diagnosis not present

## 2015-02-24 DIAGNOSIS — Z981 Arthrodesis status: Secondary | ICD-10-CM | POA: Diagnosis not present

## 2015-02-24 DIAGNOSIS — M5136 Other intervertebral disc degeneration, lumbar region: Secondary | ICD-10-CM | POA: Diagnosis not present

## 2015-03-03 DIAGNOSIS — F4321 Adjustment disorder with depressed mood: Secondary | ICD-10-CM | POA: Diagnosis not present

## 2015-03-10 DIAGNOSIS — F4321 Adjustment disorder with depressed mood: Secondary | ICD-10-CM | POA: Diagnosis not present

## 2015-03-22 DIAGNOSIS — T1490 Injury, unspecified: Secondary | ICD-10-CM | POA: Diagnosis not present

## 2015-03-22 DIAGNOSIS — Z791 Long term (current) use of non-steroidal anti-inflammatories (NSAID): Secondary | ICD-10-CM | POA: Diagnosis not present

## 2015-03-22 DIAGNOSIS — N529 Male erectile dysfunction, unspecified: Secondary | ICD-10-CM | POA: Diagnosis not present

## 2015-03-22 DIAGNOSIS — M2550 Pain in unspecified joint: Secondary | ICD-10-CM | POA: Diagnosis not present

## 2015-03-22 DIAGNOSIS — Z87448 Personal history of other diseases of urinary system: Secondary | ICD-10-CM | POA: Diagnosis not present

## 2015-03-24 DIAGNOSIS — F4321 Adjustment disorder with depressed mood: Secondary | ICD-10-CM | POA: Diagnosis not present

## 2015-03-30 DIAGNOSIS — F4321 Adjustment disorder with depressed mood: Secondary | ICD-10-CM | POA: Diagnosis not present

## 2015-04-14 DIAGNOSIS — F4321 Adjustment disorder with depressed mood: Secondary | ICD-10-CM | POA: Diagnosis not present

## 2015-04-18 DIAGNOSIS — M79642 Pain in left hand: Secondary | ICD-10-CM | POA: Diagnosis not present

## 2015-04-18 DIAGNOSIS — M65331 Trigger finger, right middle finger: Secondary | ICD-10-CM | POA: Diagnosis not present

## 2015-04-18 DIAGNOSIS — M19042 Primary osteoarthritis, left hand: Secondary | ICD-10-CM | POA: Diagnosis not present

## 2015-04-18 DIAGNOSIS — M1812 Unilateral primary osteoarthritis of first carpometacarpal joint, left hand: Secondary | ICD-10-CM | POA: Diagnosis not present

## 2015-04-21 DIAGNOSIS — F4321 Adjustment disorder with depressed mood: Secondary | ICD-10-CM | POA: Diagnosis not present

## 2015-04-28 DIAGNOSIS — F4321 Adjustment disorder with depressed mood: Secondary | ICD-10-CM | POA: Diagnosis not present

## 2015-05-05 DIAGNOSIS — F4321 Adjustment disorder with depressed mood: Secondary | ICD-10-CM | POA: Diagnosis not present

## 2015-05-26 DIAGNOSIS — F4321 Adjustment disorder with depressed mood: Secondary | ICD-10-CM | POA: Diagnosis not present

## 2015-05-27 DIAGNOSIS — E119 Type 2 diabetes mellitus without complications: Secondary | ICD-10-CM | POA: Diagnosis not present

## 2015-05-27 DIAGNOSIS — T63441A Toxic effect of venom of bees, accidental (unintentional), initial encounter: Secondary | ICD-10-CM | POA: Diagnosis not present

## 2015-05-27 DIAGNOSIS — Z87891 Personal history of nicotine dependence: Secondary | ICD-10-CM | POA: Diagnosis not present

## 2015-05-27 DIAGNOSIS — L03113 Cellulitis of right upper limb: Secondary | ICD-10-CM | POA: Diagnosis not present

## 2015-05-27 DIAGNOSIS — Z981 Arthrodesis status: Secondary | ICD-10-CM | POA: Diagnosis not present

## 2015-05-27 DIAGNOSIS — K219 Gastro-esophageal reflux disease without esophagitis: Secondary | ICD-10-CM | POA: Diagnosis not present

## 2015-05-27 DIAGNOSIS — E780 Pure hypercholesterolemia: Secondary | ICD-10-CM | POA: Diagnosis not present

## 2015-06-02 DIAGNOSIS — F4321 Adjustment disorder with depressed mood: Secondary | ICD-10-CM | POA: Diagnosis not present

## 2015-06-07 DIAGNOSIS — R5383 Other fatigue: Secondary | ICD-10-CM | POA: Diagnosis not present

## 2015-06-07 DIAGNOSIS — E785 Hyperlipidemia, unspecified: Secondary | ICD-10-CM | POA: Diagnosis not present

## 2015-06-07 DIAGNOSIS — I2510 Atherosclerotic heart disease of native coronary artery without angina pectoris: Secondary | ICD-10-CM | POA: Diagnosis not present

## 2015-06-09 DIAGNOSIS — F4321 Adjustment disorder with depressed mood: Secondary | ICD-10-CM | POA: Diagnosis not present

## 2015-06-13 DIAGNOSIS — Z23 Encounter for immunization: Secondary | ICD-10-CM | POA: Diagnosis not present

## 2015-06-13 DIAGNOSIS — W19XXXA Unspecified fall, initial encounter: Secondary | ICD-10-CM | POA: Diagnosis not present

## 2015-06-13 DIAGNOSIS — M79645 Pain in left finger(s): Secondary | ICD-10-CM | POA: Diagnosis not present

## 2015-06-13 DIAGNOSIS — S62617A Displaced fracture of proximal phalanx of left little finger, initial encounter for closed fracture: Secondary | ICD-10-CM | POA: Diagnosis not present

## 2015-06-13 DIAGNOSIS — E039 Hypothyroidism, unspecified: Secondary | ICD-10-CM | POA: Diagnosis not present

## 2015-06-14 DIAGNOSIS — E031 Congenital hypothyroidism without goiter: Secondary | ICD-10-CM | POA: Diagnosis not present

## 2015-06-14 DIAGNOSIS — E1165 Type 2 diabetes mellitus with hyperglycemia: Secondary | ICD-10-CM | POA: Diagnosis not present

## 2015-06-16 DIAGNOSIS — F4321 Adjustment disorder with depressed mood: Secondary | ICD-10-CM | POA: Diagnosis not present

## 2015-06-20 DIAGNOSIS — S62657A Nondisplaced fracture of medial phalanx of left little finger, initial encounter for closed fracture: Secondary | ICD-10-CM | POA: Diagnosis not present

## 2015-06-21 ENCOUNTER — Ambulatory Visit: Payer: Medicare Other

## 2015-06-21 ENCOUNTER — Ambulatory Visit
Admission: EM | Admit: 2015-06-21 | Discharge: 2015-06-21 | Disposition: A | Discharge status: Home / Self Care | Payer: Medicare Other | Attending: Family Medicine | Admitting: Family Medicine

## 2015-06-21 DIAGNOSIS — X58XXXA Exposure to other specified factors, initial encounter: Secondary | ICD-10-CM | POA: Insufficient documentation

## 2015-06-21 DIAGNOSIS — S63501A Unspecified sprain of right wrist, initial encounter: Secondary | ICD-10-CM | POA: Diagnosis not present

## 2015-06-21 DIAGNOSIS — S6991XA Unspecified injury of right wrist, hand and finger(s), initial encounter: Secondary | ICD-10-CM | POA: Diagnosis not present

## 2015-06-21 DIAGNOSIS — M25531 Pain in right wrist: Secondary | ICD-10-CM | POA: Diagnosis present

## 2015-06-21 HISTORY — DX: Atherosclerotic heart disease of native coronary artery without angina pectoris: I25.10

## 2015-06-21 HISTORY — DX: Gastro-esophageal reflux disease without esophagitis: K21.9

## 2015-06-21 HISTORY — DX: Unspecified osteoarthritis, unspecified site: M19.90

## 2015-06-21 HISTORY — DX: Type 2 diabetes mellitus without complications: E11.9

## 2015-06-21 HISTORY — DX: Essential (primary) hypertension: I10

## 2015-06-21 HISTORY — DX: Disorder of thyroid, unspecified: E07.9

## 2015-06-21 NOTE — ED Provider Notes (Signed)
Patient presents today with history of right wrist pain with minimal swelling. Patient states that he was using a large drill for planting yesterday when it got stuck in the ground and twisted his wrist. Patient denies any other pain in the arm or shoulder. He denies falling on an outstretched hand. Patient denies any history of wrist fracture in the past. He did initially have some numbness in the fingers but that has resolved.  ROS: Negative except mentioned above. Vitals as per Epic.   GENERAL: NAD MSK: R Wrist-minimal swelling, general tenderness of wrist, no snuff box tenderness, ROM slightly limited with full extension and flexion, nv intact   NEURO: CN II-XII grossly intact   A/P: R Wrist Sprain, Osteoarthritis- Xrays show no acute fracture, there does appear to be changes related to osteoarthritis, I will treat patient for a wrist sprain at this time with a wrist splint to be used for a few days to a week, he is to ice the area when necessary, declines any prescription pain medicine, follow up with orthopedics if symptoms persist or worsen as discussed.   Paulina Fusi, MD 06/21/15 1302

## 2015-06-21 NOTE — ED Notes (Signed)
States using a drill yesterday and drill got stuck and torqued right wrist. C/o 8/10 wrist pain and numbness in fingers

## 2015-06-26 DIAGNOSIS — Z0389 Encounter for observation for other suspected diseases and conditions ruled out: Secondary | ICD-10-CM | POA: Diagnosis not present

## 2015-06-26 DIAGNOSIS — E1165 Type 2 diabetes mellitus with hyperglycemia: Secondary | ICD-10-CM | POA: Diagnosis not present

## 2015-06-26 DIAGNOSIS — H2513 Age-related nuclear cataract, bilateral: Secondary | ICD-10-CM | POA: Diagnosis not present

## 2015-06-30 DIAGNOSIS — F4321 Adjustment disorder with depressed mood: Secondary | ICD-10-CM | POA: Diagnosis not present

## 2015-07-07 DIAGNOSIS — F4321 Adjustment disorder with depressed mood: Secondary | ICD-10-CM | POA: Diagnosis not present

## 2015-07-14 DIAGNOSIS — F4321 Adjustment disorder with depressed mood: Secondary | ICD-10-CM | POA: Diagnosis not present

## 2015-07-20 DIAGNOSIS — F4321 Adjustment disorder with depressed mood: Secondary | ICD-10-CM | POA: Diagnosis not present

## 2015-07-28 DIAGNOSIS — F4321 Adjustment disorder with depressed mood: Secondary | ICD-10-CM | POA: Diagnosis not present

## 2015-08-04 DIAGNOSIS — F4321 Adjustment disorder with depressed mood: Secondary | ICD-10-CM | POA: Diagnosis not present

## 2015-08-11 DIAGNOSIS — F4321 Adjustment disorder with depressed mood: Secondary | ICD-10-CM | POA: Diagnosis not present

## 2015-08-14 DIAGNOSIS — E039 Hypothyroidism, unspecified: Secondary | ICD-10-CM | POA: Diagnosis not present

## 2015-08-14 DIAGNOSIS — I10 Essential (primary) hypertension: Secondary | ICD-10-CM | POA: Diagnosis not present

## 2015-08-25 DIAGNOSIS — F4321 Adjustment disorder with depressed mood: Secondary | ICD-10-CM | POA: Diagnosis not present

## 2015-09-08 DIAGNOSIS — F4321 Adjustment disorder with depressed mood: Secondary | ICD-10-CM | POA: Diagnosis not present

## 2015-09-15 DIAGNOSIS — F4321 Adjustment disorder with depressed mood: Secondary | ICD-10-CM | POA: Diagnosis not present

## 2015-09-20 DIAGNOSIS — N183 Chronic kidney disease, stage 3 (moderate): Secondary | ICD-10-CM | POA: Diagnosis not present

## 2015-09-20 DIAGNOSIS — E1122 Type 2 diabetes mellitus with diabetic chronic kidney disease: Secondary | ICD-10-CM | POA: Diagnosis not present

## 2015-09-20 DIAGNOSIS — Z791 Long term (current) use of non-steroidal anti-inflammatories (NSAID): Secondary | ICD-10-CM | POA: Diagnosis not present

## 2015-09-20 DIAGNOSIS — Z87448 Personal history of other diseases of urinary system: Secondary | ICD-10-CM | POA: Diagnosis not present

## 2015-09-20 DIAGNOSIS — E039 Hypothyroidism, unspecified: Secondary | ICD-10-CM | POA: Diagnosis not present

## 2015-09-20 DIAGNOSIS — I10 Essential (primary) hypertension: Secondary | ICD-10-CM | POA: Diagnosis not present

## 2015-09-20 DIAGNOSIS — M2550 Pain in unspecified joint: Secondary | ICD-10-CM | POA: Diagnosis not present

## 2015-09-22 DIAGNOSIS — F4321 Adjustment disorder with depressed mood: Secondary | ICD-10-CM | POA: Diagnosis not present

## 2015-10-02 ENCOUNTER — Encounter: Payer: Self-pay | Admitting: Emergency Medicine

## 2015-10-02 ENCOUNTER — Ambulatory Visit
Admission: EM | Admit: 2015-10-02 | Discharge: 2015-10-02 | Disposition: A | Discharge status: Home / Self Care | Payer: Medicare Other | Attending: Family Medicine | Admitting: Family Medicine

## 2015-10-02 DIAGNOSIS — Z79899 Other long term (current) drug therapy: Secondary | ICD-10-CM | POA: Diagnosis not present

## 2015-10-02 DIAGNOSIS — K219 Gastro-esophageal reflux disease without esophagitis: Secondary | ICD-10-CM | POA: Diagnosis not present

## 2015-10-02 DIAGNOSIS — Z7982 Long term (current) use of aspirin: Secondary | ICD-10-CM | POA: Insufficient documentation

## 2015-10-02 DIAGNOSIS — R079 Chest pain, unspecified: Secondary | ICD-10-CM | POA: Diagnosis not present

## 2015-10-02 DIAGNOSIS — I2510 Atherosclerotic heart disease of native coronary artery without angina pectoris: Secondary | ICD-10-CM | POA: Insufficient documentation

## 2015-10-02 DIAGNOSIS — Z87891 Personal history of nicotine dependence: Secondary | ICD-10-CM | POA: Diagnosis not present

## 2015-10-02 DIAGNOSIS — R112 Nausea with vomiting, unspecified: Secondary | ICD-10-CM | POA: Diagnosis not present

## 2015-10-02 DIAGNOSIS — Z7984 Long term (current) use of oral hypoglycemic drugs: Secondary | ICD-10-CM | POA: Insufficient documentation

## 2015-10-02 DIAGNOSIS — E079 Disorder of thyroid, unspecified: Secondary | ICD-10-CM | POA: Insufficient documentation

## 2015-10-02 DIAGNOSIS — E119 Type 2 diabetes mellitus without complications: Secondary | ICD-10-CM | POA: Insufficient documentation

## 2015-10-02 DIAGNOSIS — I10 Essential (primary) hypertension: Secondary | ICD-10-CM | POA: Insufficient documentation

## 2015-10-02 DIAGNOSIS — Z888 Allergy status to other drugs, medicaments and biological substances status: Secondary | ICD-10-CM | POA: Diagnosis not present

## 2015-10-02 DIAGNOSIS — M79602 Pain in left arm: Secondary | ICD-10-CM | POA: Diagnosis not present

## 2015-10-02 DIAGNOSIS — E7800 Pure hypercholesterolemia, unspecified: Secondary | ICD-10-CM | POA: Diagnosis not present

## 2015-10-02 DIAGNOSIS — Z981 Arthrodesis status: Secondary | ICD-10-CM | POA: Diagnosis not present

## 2015-10-02 DIAGNOSIS — E1140 Type 2 diabetes mellitus with diabetic neuropathy, unspecified: Secondary | ICD-10-CM | POA: Diagnosis not present

## 2015-10-02 DIAGNOSIS — M6281 Muscle weakness (generalized): Secondary | ICD-10-CM | POA: Diagnosis not present

## 2015-10-02 DIAGNOSIS — Z794 Long term (current) use of insulin: Secondary | ICD-10-CM | POA: Insufficient documentation

## 2015-10-02 DIAGNOSIS — R002 Palpitations: Secondary | ICD-10-CM | POA: Diagnosis not present

## 2015-10-02 DIAGNOSIS — Z791 Long term (current) use of non-steroidal anti-inflammatories (NSAID): Secondary | ICD-10-CM | POA: Diagnosis not present

## 2015-10-02 DIAGNOSIS — R0789 Other chest pain: Secondary | ICD-10-CM | POA: Diagnosis not present

## 2015-10-02 DIAGNOSIS — Z96653 Presence of artificial knee joint, bilateral: Secondary | ICD-10-CM | POA: Diagnosis not present

## 2015-10-02 DIAGNOSIS — D649 Anemia, unspecified: Secondary | ICD-10-CM | POA: Diagnosis not present

## 2015-10-02 DIAGNOSIS — Z951 Presence of aortocoronary bypass graft: Secondary | ICD-10-CM | POA: Diagnosis not present

## 2015-10-02 DIAGNOSIS — R197 Diarrhea, unspecified: Secondary | ICD-10-CM | POA: Diagnosis not present

## 2015-10-02 NOTE — ED Provider Notes (Signed)
CSN: [id number]     Arrival date & time 10/02/15  1024 History   First MD Initiated Contact with Patient 10/02/15 1036     Chief Complaint  Patient presents with  . Chest Pain   (Consider location/radiation/quality/duration/timing/severity/associated sxs/prior Treatment) HPI Comments: 69 yo male with h/o CAD, s/p CABG in 2008, and ongoing chronic medical problems (multipe cardiac risk factors) presents with a c/o left sided chest pain/discomfort and left arm "achy" discomfort that woke up him up from sleep at 5am and has been waxing and waning in intensity since then. Currently states only feeling it mildly, "barely". Vomited x1 when he woke up at 5am. Denies shortness of breath, fevers, chills. States has also been under a lot of emotional stress lately.   Patient is a 69 y.o. male presenting with chest pain. The history is provided by the patient.  Chest Pain Pain location:  L chest Pain quality: aching, radiating and throbbing   Pain radiates to:  L arm Pain radiates to the back: no   Pain severity:  Moderate Onset quality:  Sudden Duration:  5 hours Timing:  Constant Progression:  Waxing and waning Chronicity:  New Context: at rest   Context comment:  States chest and and left arm discomfort woke him up from sleep at around 5am Risk factors: coronary artery disease, diabetes mellitus, high cholesterol, hypertension, male sex and obesity     Past Medical History  Diagnosis Date  . Arthritis   . Coronary artery disease   . Diabetes mellitus without complication ([place])   . Hypertension   . Thyroid disease   . GERD (gastroesophageal reflux disease)    Past Surgical History  Procedure Laterality Date  . Back surgery    . Hernia repair    . Hydrocele excision / repair    . Tonsillectomy    . Joint replacement     Family History  Problem Relation Age of Onset  . Stroke Father    Social History  Substance Use Topics  . Smoking status: Former Research scientist (life sciences)  . Smokeless tobacco:  None  . Alcohol Use: Yes     Comment: rare    Review of Systems  Cardiovascular: Positive for chest pain.    Allergies  Niacin and related and Statins  Home Medications   Prior to Admission medications   Medication Sig Start Date End Date Taking? Authorizing Provider  alfuzosin (UROXATRAL) 10 MG 24 hr tablet Take 10 mg by mouth daily with breakfast.    Historical Provider, MD  aspirin EC 325 MG tablet Take 325 mg by mouth daily.    Historical Provider, MD  ezetimibe (ZETIA) 10 MG tablet Take 10 mg by mouth daily.    Historical Provider, MD  glipiZIDE (GLUCOTROL) 10 MG tablet Take 10 mg by mouth 2 (two) times daily before a meal.    Historical Provider, MD  hydrochlorothiazide (HYDRODIURIL) 25 MG tablet Take 25 mg by mouth daily.    Historical Provider, MD  insulin glargine (LANTUS) 100 UNIT/ML injection Inject 25 Units into the skin at bedtime.    Historical Provider, MD  levothyroxine (SYNTHROID, LEVOTHROID) 75 MCG tablet Take 75 mcg by mouth daily before breakfast.    Historical Provider, MD  lisinopril (PRINIVIL,ZESTRIL) 10 MG tablet Take 10 mg by mouth daily.    Historical Provider, MD  magnesium oxide (MAG-OX) 400 MG tablet Take 400 mg by mouth daily.    Historical Provider, MD  metFORMIN (GLUCOPHAGE) 1000 MG tablet Take 1,000 mg by mouth  2 (two) times daily with a meal.    Historical Provider, MD  metoprolol succinate (TOPROL-XL) 25 MG 24 hr tablet Take 25 mg by mouth 2 (two) times daily.    Historical Provider, MD  omeprazole (PRILOSEC) 40 MG capsule Take 40 mg by mouth daily.    Historical Provider, MD  rosuvastatin (CRESTOR) 5 MG tablet Take 5 mg by mouth daily.    Historical Provider, MD  tadalafil (CIALIS) 5 MG tablet Take 5 mg by mouth daily as needed for erectile dysfunction.    Historical Provider, MD  valACYclovir (VALTREX) 500 MG tablet Take 500 mg by mouth 2 (two) times daily.    Historical Provider, MD   Meds Ordered and Administered this Visit  Medications - No  data to display  BP 141/84 mmHg  Pulse 99  Temp(Src) 98.6 F (37 C) (Tympanic)  Resp 16  Ht 5\' 6"  (1.676 m)  Wt 186 lb (84.369 kg)  BMI 30.04 kg/m2  SpO2 99% No data found.   Physical Exam  Constitutional: He appears well-developed and well-nourished. No distress.  Cardiovascular: Normal rate, regular rhythm and normal heart sounds.   Pulmonary/Chest: Effort normal and breath sounds normal. No respiratory distress. He has no wheezes. He has no rales.  Neurological: He is alert.  Skin: He is not diaphoretic.  Nursing note and vitals reviewed.   ED Course  Procedures (including critical care time)  Labs Review Labs Reviewed - No data to display  Imaging Review No results found.   Visual Acuity Review  Right Eye Distance:   Left Eye Distance:   Bilateral Distance:    Right Eye Near:   Left Eye Near:    Bilateral Near:       EKG:"normal EKG, normal sinus rhythm",no previous tracing for comparison   MDM   1. Chest pain, unspecified chest pain type   2. Left arm pain   3. Diabetes with complications 4. CAD, s/p CABG in 2008  1. EKG (normal) reviewed with patient; however due to patient's h/o CAD and multiple ongoing chronic medical problems and cardiac risk factors, as well as symptoms of angina/unstable angina, recommend patient go to hospital ED by EMS for further evaluation and management. Patient verbalizes understanding and left urgent care facility in stable condition.   Norval Gable, MD 10/02/15 [phone]

## 2015-10-02 NOTE — ED Notes (Signed)
Chest pains since 5 am, nauseated, vomiting. Leg cramps diarrhea, Low back pain, left arm aching. Feels like heart want to pound out of his chest

## 2015-10-02 NOTE — ED Notes (Signed)
Via EMS to [facility]

## 2015-10-13 DIAGNOSIS — F4321 Adjustment disorder with depressed mood: Secondary | ICD-10-CM | POA: Diagnosis not present

## 2015-10-20 DIAGNOSIS — F4321 Adjustment disorder with depressed mood: Secondary | ICD-10-CM | POA: Diagnosis not present

## 2015-11-03 DIAGNOSIS — F4321 Adjustment disorder with depressed mood: Secondary | ICD-10-CM | POA: Diagnosis not present

## 2015-11-10 DIAGNOSIS — F4321 Adjustment disorder with depressed mood: Secondary | ICD-10-CM | POA: Diagnosis not present

## 2015-11-17 DIAGNOSIS — F4321 Adjustment disorder with depressed mood: Secondary | ICD-10-CM | POA: Diagnosis not present

## 2015-11-30 DIAGNOSIS — L304 Erythema intertrigo: Secondary | ICD-10-CM | POA: Diagnosis not present

## 2015-11-30 DIAGNOSIS — L218 Other seborrheic dermatitis: Secondary | ICD-10-CM | POA: Diagnosis not present

## 2015-12-04 DIAGNOSIS — N179 Acute kidney failure, unspecified: Secondary | ICD-10-CM | POA: Diagnosis not present

## 2015-12-05 DIAGNOSIS — N183 Chronic kidney disease, stage 3 (moderate): Secondary | ICD-10-CM | POA: Diagnosis not present

## 2015-12-05 DIAGNOSIS — G8929 Other chronic pain: Secondary | ICD-10-CM | POA: Diagnosis not present

## 2015-12-05 DIAGNOSIS — I2510 Atherosclerotic heart disease of native coronary artery without angina pectoris: Secondary | ICD-10-CM | POA: Diagnosis not present

## 2015-12-05 DIAGNOSIS — Z791 Long term (current) use of non-steroidal anti-inflammatories (NSAID): Secondary | ICD-10-CM | POA: Diagnosis not present

## 2015-12-05 DIAGNOSIS — I10 Essential (primary) hypertension: Secondary | ICD-10-CM | POA: Diagnosis not present

## 2015-12-05 DIAGNOSIS — E785 Hyperlipidemia, unspecified: Secondary | ICD-10-CM | POA: Diagnosis not present

## 2015-12-05 DIAGNOSIS — M545 Low back pain: Secondary | ICD-10-CM | POA: Diagnosis not present

## 2015-12-06 DIAGNOSIS — F4321 Adjustment disorder with depressed mood: Secondary | ICD-10-CM | POA: Diagnosis not present

## 2015-12-20 DIAGNOSIS — I10 Essential (primary) hypertension: Secondary | ICD-10-CM | POA: Diagnosis not present

## 2015-12-20 DIAGNOSIS — E119 Type 2 diabetes mellitus without complications: Secondary | ICD-10-CM | POA: Diagnosis not present

## 2015-12-20 DIAGNOSIS — N179 Acute kidney failure, unspecified: Secondary | ICD-10-CM | POA: Diagnosis not present

## 2015-12-22 DIAGNOSIS — F4321 Adjustment disorder with depressed mood: Secondary | ICD-10-CM | POA: Diagnosis not present

## 2015-12-29 ENCOUNTER — Ambulatory Visit
Admission: EM | Admit: 2015-12-29 | Discharge: 2015-12-29 | Disposition: A | Discharge status: Home / Self Care | Payer: Medicare Other | Attending: Family Medicine | Admitting: Family Medicine

## 2015-12-29 ENCOUNTER — Ambulatory Visit: Payer: Medicare Other

## 2015-12-29 DIAGNOSIS — E079 Disorder of thyroid, unspecified: Secondary | ICD-10-CM | POA: Insufficient documentation

## 2015-12-29 DIAGNOSIS — Z794 Long term (current) use of insulin: Secondary | ICD-10-CM | POA: Insufficient documentation

## 2015-12-29 DIAGNOSIS — E119 Type 2 diabetes mellitus without complications: Secondary | ICD-10-CM | POA: Insufficient documentation

## 2015-12-29 DIAGNOSIS — K219 Gastro-esophageal reflux disease without esophagitis: Secondary | ICD-10-CM | POA: Insufficient documentation

## 2015-12-29 DIAGNOSIS — B9789 Other viral agents as the cause of diseases classified elsewhere: Secondary | ICD-10-CM

## 2015-12-29 DIAGNOSIS — Z87891 Personal history of nicotine dependence: Secondary | ICD-10-CM | POA: Diagnosis not present

## 2015-12-29 DIAGNOSIS — Z79899 Other long term (current) drug therapy: Secondary | ICD-10-CM | POA: Diagnosis not present

## 2015-12-29 DIAGNOSIS — I10 Essential (primary) hypertension: Secondary | ICD-10-CM | POA: Insufficient documentation

## 2015-12-29 DIAGNOSIS — Z7982 Long term (current) use of aspirin: Secondary | ICD-10-CM | POA: Diagnosis not present

## 2015-12-29 DIAGNOSIS — I2510 Atherosclerotic heart disease of native coronary artery without angina pectoris: Secondary | ICD-10-CM | POA: Diagnosis not present

## 2015-12-29 DIAGNOSIS — R05 Cough: Secondary | ICD-10-CM | POA: Insufficient documentation

## 2015-12-29 DIAGNOSIS — J069 Acute upper respiratory infection, unspecified: Secondary | ICD-10-CM | POA: Insufficient documentation

## 2015-12-29 LAB — BASIC METABOLIC PANEL
ANION GAP: 7 (ref 5–15)
BUN: 38 mg/dL — ABNORMAL HIGH (ref 6–20)
CHLORIDE: 102 mmol/L (ref 101–111)
CO2: 24 mmol/L (ref 22–32)
Calcium: 8.4 mg/dL — ABNORMAL LOW (ref 8.9–10.3)
Creatinine, Ser: 1.41 mg/dL — ABNORMAL HIGH (ref 0.61–1.24)
GFR calc non Af Amer: 49 mL/min — ABNORMAL LOW (ref >60)
GFR, EST AFRICAN AMERICAN: 56 mL/min — AB (ref >60)
GLUCOSE: 95 mg/dL (ref 65–99)
POTASSIUM: 3.8 mmol/L (ref 3.5–5.1)
Sodium: 133 mmol/L — ABNORMAL LOW (ref 135–145)

## 2015-12-29 LAB — CBC WITH DIFFERENTIAL/PLATELET
BASOS ABS: 0 10*3/uL (ref 0–0.1)
Basophils Relative: 1 %
Eosinophils Absolute: 0.1 10*3/uL (ref 0–0.7)
Eosinophils Relative: 4 %
HEMATOCRIT: 36.4 % — AB (ref 40.0–52.0)
HEMOGLOBIN: 11.8 g/dL — AB (ref 13.0–18.0)
LYMPHS PCT: 29 %
Lymphs Abs: 1 10*3/uL (ref 1.0–3.6)
MCH: 26.4 pg (ref 26.0–34.0)
MCHC: 32.4 g/dL (ref 32.0–36.0)
MCV: 81.4 fL (ref 80.0–100.0)
MONO ABS: 0.5 10*3/uL (ref 0.2–1.0)
Monocytes Relative: 14 %
NEUTROS ABS: 1.7 10*3/uL (ref 1.4–6.5)
NEUTROS PCT: 52 %
Platelets: 175 10*3/uL (ref 150–440)
RBC: 4.47 MIL/uL (ref 4.40–5.90)
RDW: 16.8 % — ABNORMAL HIGH (ref 11.5–14.5)
WBC: 3.3 10*3/uL — AB (ref 3.8–10.6)

## 2015-12-29 LAB — RAPID INFLUENZA A&B ANTIGENS
Influenza A (ARMC): NEGATIVE
Influenza B (ARMC): NEGATIVE

## 2015-12-29 LAB — RAPID STREP SCREEN (MED CTR MEBANE ONLY): STREPTOCOCCUS, GROUP A SCREEN (DIRECT): NEGATIVE

## 2015-12-29 MED ORDER — GUAIFENESIN-CODEINE 100-10 MG/5ML PO SOLN: 10.0000 mL | Freq: Four times a day (QID) | ORAL | Status: DC | PRN

## 2015-12-29 NOTE — ED Notes (Signed)
Patient c/o cough, sore throat, and body aches which all started Monday afternoon.  Wife was diagnosed with Influenza on Saturday.

## 2015-12-29 NOTE — ED Provider Notes (Addendum)
CSN: [id number]     Arrival date & time 12/29/15  1341 History   First MD Initiated Contact with Patient 12/29/15 1500     Chief Complaint  Patient presents with  . URI   (Consider location/radiation/quality/duration/timing/severity/associated sxs/prior Treatment) Patient is a 70 y.o. male presenting with URI. The history is provided by the patient.  URI Presenting symptoms: congestion, cough and rhinorrhea   Severity:  Moderate Duration:  5 days Timing:  Constant Progression:  Unchanged Chronicity:  New Relieved by:  Nothing Ineffective treatments:  OTC medications (patient states has been taking Tamiflu prescribed by PCP) Associated symptoms: no arthralgias, no headaches, no myalgias, no neck pain, no sinus pain, no sneezing and no wheezing   Risk factors: sick contacts (wife diagnosed with flu one week ago)   Risk factors: not elderly, no chronic cardiac disease, no chronic kidney disease, no chronic respiratory disease, no diabetes mellitus, no immunosuppression, no recent illness and no recent travel     Past Medical History  Diagnosis Date  . Arthritis   . Coronary artery disease   . Diabetes mellitus without complication ([place])   . Hypertension   . Thyroid disease   . GERD (gastroesophageal reflux disease)    Past Surgical History  Procedure Laterality Date  . Back surgery    . Hernia repair    . Hydrocele excision / repair    . Tonsillectomy    . Joint replacement     Family History  Problem Relation Age of Onset  . Stroke Father    Social History  Substance Use Topics  . Smoking status: Former Research scientist (life sciences)  . Smokeless tobacco: None  . Alcohol Use: Yes     Comment: rare    Review of Systems  HENT: Positive for congestion and rhinorrhea. Negative for sneezing.   Respiratory: Positive for cough. Negative for wheezing.   Musculoskeletal: Negative for myalgias, arthralgias and neck pain.  Neurological: Negative for headaches.    Allergies  Niacin and related  and Statins  Home Medications   Prior to Admission medications   Medication Sig Start Date End Date Taking? Authorizing Provider  alfuzosin (UROXATRAL) 10 MG 24 hr tablet Take 10 mg by mouth daily with breakfast.    Historical Provider, MD  aspirin EC 325 MG tablet Take 325 mg by mouth daily.    Historical Provider, MD  ezetimibe (ZETIA) 10 MG tablet Take 10 mg by mouth daily.    Historical Provider, MD  glipiZIDE (GLUCOTROL) 10 MG tablet Take 10 mg by mouth 2 (two) times daily before a meal.    Historical Provider, MD  guaiFENesin-codeine 100-10 MG/5ML syrup Take 10 mLs by mouth every 6 (six) hours as needed for cough. 12/29/15   Norval Gable, MD  hydrochlorothiazide (HYDRODIURIL) 25 MG tablet Take 25 mg by mouth daily.    Historical Provider, MD  insulin glargine (LANTUS) 100 UNIT/ML injection Inject 25 Units into the skin at bedtime.    Historical Provider, MD  levothyroxine (SYNTHROID, LEVOTHROID) 75 MCG tablet Take 75 mcg by mouth daily before breakfast.    Historical Provider, MD  lisinopril (PRINIVIL,ZESTRIL) 10 MG tablet Take 10 mg by mouth daily.    Historical Provider, MD  magnesium oxide (MAG-OX) 400 MG tablet Take 400 mg by mouth daily.    Historical Provider, MD  metFORMIN (GLUCOPHAGE) 1000 MG tablet Take 1,000 mg by mouth 2 (two) times daily with a meal.    Historical Provider, MD  metoprolol succinate (TOPROL-XL) 25 MG 24 hr  tablet Take 25 mg by mouth 2 (two) times daily.    Historical Provider, MD  omeprazole (PRILOSEC) 40 MG capsule Take 40 mg by mouth daily.    Historical Provider, MD  rosuvastatin (CRESTOR) 5 MG tablet Take 5 mg by mouth daily.    Historical Provider, MD  tadalafil (CIALIS) 5 MG tablet Take 5 mg by mouth daily as needed for erectile dysfunction.    Historical Provider, MD  valACYclovir (VALTREX) 500 MG tablet Take 500 mg by mouth 2 (two) times daily.    Historical Provider, MD   Meds Ordered and Administered this Visit  Medications - No data to  display  BP 91/62 mmHg  Pulse 65  Temp(Src) 98 F (36.7 C) (Oral)  Resp 16  Ht 5\' 7"  (1.702 m)  Wt 190 lb (86.183 kg)  BMI 29.75 kg/m2  SpO2 100% No data found.   Physical Exam  Constitutional: He appears well-developed and well-nourished. No distress.  HENT:  Head: Normocephalic and atraumatic.  Right Ear: Tympanic membrane, external ear and ear canal normal.  Left Ear: Tympanic membrane, external ear and ear canal normal.  Nose: Nose normal.  Mouth/Throat: Uvula is midline, oropharynx is clear and moist and mucous membranes are normal. No oropharyngeal exudate or tonsillar abscesses.  Eyes: Conjunctivae and EOM are normal. Pupils are equal, round, and reactive to light. Right eye exhibits no discharge. Left eye exhibits no discharge. No scleral icterus.  Neck: Normal range of motion. Neck supple. No tracheal deviation present. No thyromegaly present.  Cardiovascular: Normal rate, regular rhythm and normal heart sounds.   Pulmonary/Chest: Effort normal and breath sounds normal. No stridor. No respiratory distress. He has no wheezes. He has no rales. He exhibits no tenderness.  Lymphadenopathy:    He has no cervical adenopathy.  Neurological: He is alert.  Skin: Skin is warm and dry. No rash noted. He is not diaphoretic.  Nursing note and vitals reviewed.   ED Course  Procedures (including critical care time)  Labs Review Labs Reviewed  CBC WITH DIFFERENTIAL/PLATELET - Abnormal; Notable for the following:    WBC 3.3 (*)    Hemoglobin 11.8 (*)    HCT 36.4 (*)    RDW 16.8 (*)    All other components within normal limits  BASIC METABOLIC PANEL - Abnormal; Notable for the following:    Sodium 133 (*)    BUN 38 (*)    Creatinine, Ser 1.41 (*)    Calcium 8.4 (*)    GFR calc non Af Amer 49 (*)    GFR calc Af Amer 56 (*)    All other components within normal limits  RAPID INFLUENZA A&B ANTIGENS ([facility] ONLY)  RAPID STREP SCREEN (NOT AT [facility])  CULTURE, GROUP A STREP [facility])     Imaging Review Dg Chest 2 View  12/29/2015  CLINICAL DATA:  History of open heart surgery, with cough for 1 week. EXAM: CHEST  2 VIEW COMPARISON:  04/29/2014. FINDINGS: The heart size and mediastinal contours are within normal limits. Both lungs are clear. The visualized skeletal structures are unremarkable. Prior CABG. Degenerative change thoracic spine. IMPRESSION: No active cardiopulmonary disease. Electronically Signed   By: Staci Righter M.D.   On: 12/29/2015 15:49     Visual Acuity Review  Right Eye Distance:   Left Eye Distance:   Bilateral Distance:    Right Eye Near:   Left Eye Near:    Bilateral Near:         MDM  1. Viral URI with cough    Discharge Medication List as of 12/29/2015  5:13 PM    START taking these medications   Details  guaiFENesin-codeine 100-10 MG/5ML syrup Take 10 mLs by mouth every 6 (six) hours as needed for cough., Starting 12/29/2015, Until Discontinued, Print       1. Labs/x-ray results and diagnosis reviewed with patient 2. rx as per orders above; reviewed possible side effects, interactions, risks and benefits  3. Recommend supportive treatment with increased fluids  4. F/u with pcp and nephrologist next week  5. Follow-up prn if symptoms worsen or don't improve    Norval Gable, MD 12/29/15 [place], MD 12/29/15 [phone]

## 2015-12-29 NOTE — ED Notes (Signed)
Pt given gingerale , crackers and peanut butter.

## 2015-12-30 DIAGNOSIS — R0789 Other chest pain: Secondary | ICD-10-CM | POA: Diagnosis not present

## 2015-12-30 DIAGNOSIS — E079 Disorder of thyroid, unspecified: Secondary | ICD-10-CM | POA: Diagnosis not present

## 2015-12-30 DIAGNOSIS — E119 Type 2 diabetes mellitus without complications: Secondary | ICD-10-CM | POA: Diagnosis not present

## 2015-12-30 DIAGNOSIS — R0981 Nasal congestion: Secondary | ICD-10-CM | POA: Diagnosis not present

## 2015-12-30 DIAGNOSIS — R222 Localized swelling, mass and lump, trunk: Secondary | ICD-10-CM | POA: Diagnosis not present

## 2015-12-30 DIAGNOSIS — R05 Cough: Secondary | ICD-10-CM | POA: Diagnosis not present

## 2015-12-30 DIAGNOSIS — B349 Viral infection, unspecified: Secondary | ICD-10-CM | POA: Diagnosis not present

## 2015-12-31 LAB — CULTURE, GROUP A STREP (THRC)

## 2016-01-04 DIAGNOSIS — F4321 Adjustment disorder with depressed mood: Secondary | ICD-10-CM | POA: Diagnosis not present

## 2016-01-12 DIAGNOSIS — F4321 Adjustment disorder with depressed mood: Secondary | ICD-10-CM | POA: Diagnosis not present

## 2016-01-19 DIAGNOSIS — F4321 Adjustment disorder with depressed mood: Secondary | ICD-10-CM | POA: Diagnosis not present

## 2016-01-23 DIAGNOSIS — Z823 Family history of stroke: Secondary | ICD-10-CM | POA: Diagnosis not present

## 2016-01-23 DIAGNOSIS — R0789 Other chest pain: Secondary | ICD-10-CM | POA: Diagnosis not present

## 2016-01-23 DIAGNOSIS — E039 Hypothyroidism, unspecified: Secondary | ICD-10-CM | POA: Diagnosis not present

## 2016-01-23 DIAGNOSIS — I10 Essential (primary) hypertension: Secondary | ICD-10-CM | POA: Diagnosis not present

## 2016-01-23 DIAGNOSIS — Z833 Family history of diabetes mellitus: Secondary | ICD-10-CM | POA: Diagnosis not present

## 2016-01-23 DIAGNOSIS — Z87891 Personal history of nicotine dependence: Secondary | ICD-10-CM | POA: Diagnosis not present

## 2016-01-23 DIAGNOSIS — J984 Other disorders of lung: Secondary | ICD-10-CM | POA: Diagnosis not present

## 2016-01-23 DIAGNOSIS — Z7982 Long term (current) use of aspirin: Secondary | ICD-10-CM | POA: Diagnosis not present

## 2016-01-23 DIAGNOSIS — Z888 Allergy status to other drugs, medicaments and biological substances status: Secondary | ICD-10-CM | POA: Diagnosis not present

## 2016-01-23 DIAGNOSIS — R918 Other nonspecific abnormal finding of lung field: Secondary | ICD-10-CM | POA: Diagnosis not present

## 2016-01-23 DIAGNOSIS — Z981 Arthrodesis status: Secondary | ICD-10-CM | POA: Diagnosis not present

## 2016-01-23 DIAGNOSIS — Z8619 Personal history of other infectious and parasitic diseases: Secondary | ICD-10-CM | POA: Diagnosis not present

## 2016-01-23 DIAGNOSIS — E1142 Type 2 diabetes mellitus with diabetic polyneuropathy: Secondary | ICD-10-CM | POA: Diagnosis not present

## 2016-01-23 DIAGNOSIS — Z794 Long term (current) use of insulin: Secondary | ICD-10-CM | POA: Diagnosis not present

## 2016-01-23 DIAGNOSIS — M954 Acquired deformity of chest and rib: Secondary | ICD-10-CM | POA: Diagnosis not present

## 2016-01-23 DIAGNOSIS — E7800 Pure hypercholesterolemia, unspecified: Secondary | ICD-10-CM | POA: Diagnosis not present

## 2016-01-23 DIAGNOSIS — Z951 Presence of aortocoronary bypass graft: Secondary | ICD-10-CM | POA: Diagnosis not present

## 2016-01-23 DIAGNOSIS — Z79899 Other long term (current) drug therapy: Secondary | ICD-10-CM | POA: Diagnosis not present

## 2016-01-23 DIAGNOSIS — J398 Other specified diseases of upper respiratory tract: Secondary | ICD-10-CM | POA: Diagnosis not present

## 2016-01-23 DIAGNOSIS — Z791 Long term (current) use of non-steroidal anti-inflammatories (NSAID): Secondary | ICD-10-CM | POA: Diagnosis not present

## 2016-01-23 DIAGNOSIS — K219 Gastro-esophageal reflux disease without esophagitis: Secondary | ICD-10-CM | POA: Diagnosis not present

## 2016-01-23 DIAGNOSIS — Z8249 Family history of ischemic heart disease and other diseases of the circulatory system: Secondary | ICD-10-CM | POA: Diagnosis not present

## 2016-01-26 DIAGNOSIS — F4321 Adjustment disorder with depressed mood: Secondary | ICD-10-CM | POA: Diagnosis not present

## 2016-01-31 DIAGNOSIS — F4321 Adjustment disorder with depressed mood: Secondary | ICD-10-CM | POA: Diagnosis not present

## 2016-02-07 DIAGNOSIS — L218 Other seborrheic dermatitis: Secondary | ICD-10-CM | POA: Diagnosis not present

## 2016-02-07 DIAGNOSIS — F4321 Adjustment disorder with depressed mood: Secondary | ICD-10-CM | POA: Diagnosis not present

## 2016-02-07 DIAGNOSIS — Z1283 Encounter for screening for malignant neoplasm of skin: Secondary | ICD-10-CM | POA: Diagnosis not present

## 2016-02-07 DIAGNOSIS — L728 Other follicular cysts of the skin and subcutaneous tissue: Secondary | ICD-10-CM | POA: Diagnosis not present

## 2016-02-07 DIAGNOSIS — B372 Candidiasis of skin and nail: Secondary | ICD-10-CM | POA: Diagnosis not present

## 2016-02-07 DIAGNOSIS — L570 Actinic keratosis: Secondary | ICD-10-CM | POA: Diagnosis not present

## 2016-02-12 ENCOUNTER — Other Ambulatory Visit: Payer: Self-pay

## 2016-02-14 DIAGNOSIS — F4321 Adjustment disorder with depressed mood: Secondary | ICD-10-CM | POA: Diagnosis not present

## 2016-02-21 DIAGNOSIS — F4321 Adjustment disorder with depressed mood: Secondary | ICD-10-CM | POA: Diagnosis not present

## 2016-02-27 ENCOUNTER — Ambulatory Visit
Admission: EM | Admit: 2016-02-27 | Discharge: 2016-02-27 | Disposition: A | Discharge status: Home / Self Care | Payer: Medicare Other | Attending: Family Medicine | Admitting: Family Medicine

## 2016-02-27 DIAGNOSIS — L03115 Cellulitis of right lower limb: Secondary | ICD-10-CM

## 2016-02-27 MED ORDER — SULFAMETHOXAZOLE-TRIMETHOPRIM 800-160 MG PO TABS: 1.0000 | ORAL_TABLET | Freq: Two times a day (BID) | ORAL | Status: DC

## 2016-02-27 NOTE — Discharge Instructions (Signed)

## 2016-02-28 DIAGNOSIS — F4321 Adjustment disorder with depressed mood: Secondary | ICD-10-CM | POA: Diagnosis not present

## 2016-03-06 DIAGNOSIS — F4321 Adjustment disorder with depressed mood: Secondary | ICD-10-CM | POA: Diagnosis not present

## 2016-03-12 DIAGNOSIS — F4321 Adjustment disorder with depressed mood: Secondary | ICD-10-CM | POA: Diagnosis not present

## 2016-03-13 DIAGNOSIS — Z125 Encounter for screening for malignant neoplasm of prostate: Secondary | ICD-10-CM | POA: Diagnosis not present

## 2016-03-15 DIAGNOSIS — G8929 Other chronic pain: Secondary | ICD-10-CM | POA: Diagnosis not present

## 2016-03-15 DIAGNOSIS — M1612 Unilateral primary osteoarthritis, left hip: Secondary | ICD-10-CM | POA: Diagnosis not present

## 2016-03-15 DIAGNOSIS — G5732 Lesion of lateral popliteal nerve, left lower limb: Secondary | ICD-10-CM | POA: Diagnosis not present

## 2016-03-15 DIAGNOSIS — Z96652 Presence of left artificial knee joint: Secondary | ICD-10-CM | POA: Diagnosis not present

## 2016-03-15 DIAGNOSIS — M1712 Unilateral primary osteoarthritis, left knee: Secondary | ICD-10-CM | POA: Diagnosis not present

## 2016-03-15 DIAGNOSIS — M25562 Pain in left knee: Secondary | ICD-10-CM | POA: Diagnosis not present

## 2016-03-18 DIAGNOSIS — L821 Other seborrheic keratosis: Secondary | ICD-10-CM | POA: Diagnosis not present

## 2016-03-18 DIAGNOSIS — Z1283 Encounter for screening for malignant neoplasm of skin: Secondary | ICD-10-CM | POA: Diagnosis not present

## 2016-03-18 DIAGNOSIS — D229 Melanocytic nevi, unspecified: Secondary | ICD-10-CM | POA: Diagnosis not present

## 2016-03-18 DIAGNOSIS — D485 Neoplasm of uncertain behavior of skin: Secondary | ICD-10-CM | POA: Diagnosis not present

## 2016-03-18 DIAGNOSIS — B372 Candidiasis of skin and nail: Secondary | ICD-10-CM | POA: Diagnosis not present

## 2016-03-18 DIAGNOSIS — L923 Foreign body granuloma of the skin and subcutaneous tissue: Secondary | ICD-10-CM | POA: Diagnosis not present

## 2016-03-18 DIAGNOSIS — L720 Epidermal cyst: Secondary | ICD-10-CM | POA: Diagnosis not present

## 2016-03-18 DIAGNOSIS — L728 Other follicular cysts of the skin and subcutaneous tissue: Secondary | ICD-10-CM | POA: Diagnosis not present

## 2016-03-20 DIAGNOSIS — F4321 Adjustment disorder with depressed mood: Secondary | ICD-10-CM | POA: Diagnosis not present

## 2016-03-20 DIAGNOSIS — I10 Essential (primary) hypertension: Secondary | ICD-10-CM | POA: Diagnosis not present

## 2016-03-27 DIAGNOSIS — F4321 Adjustment disorder with depressed mood: Secondary | ICD-10-CM | POA: Diagnosis not present

## 2016-04-03 DIAGNOSIS — F4321 Adjustment disorder with depressed mood: Secondary | ICD-10-CM | POA: Diagnosis not present

## 2016-04-04 DIAGNOSIS — E119 Type 2 diabetes mellitus without complications: Secondary | ICD-10-CM | POA: Diagnosis not present

## 2016-04-04 DIAGNOSIS — I10 Essential (primary) hypertension: Secondary | ICD-10-CM | POA: Diagnosis not present

## 2016-04-04 DIAGNOSIS — N179 Acute kidney failure, unspecified: Secondary | ICD-10-CM | POA: Diagnosis not present

## 2016-04-10 DIAGNOSIS — F4321 Adjustment disorder with depressed mood: Secondary | ICD-10-CM | POA: Diagnosis not present

## 2016-04-17 DIAGNOSIS — F4321 Adjustment disorder with depressed mood: Secondary | ICD-10-CM | POA: Diagnosis not present

## 2016-04-17 DIAGNOSIS — I10 Essential (primary) hypertension: Secondary | ICD-10-CM | POA: Diagnosis not present

## 2016-04-17 DIAGNOSIS — N179 Acute kidney failure, unspecified: Secondary | ICD-10-CM | POA: Diagnosis not present

## 2016-04-17 DIAGNOSIS — E119 Type 2 diabetes mellitus without complications: Secondary | ICD-10-CM | POA: Diagnosis not present

## 2016-04-24 DIAGNOSIS — F4321 Adjustment disorder with depressed mood: Secondary | ICD-10-CM | POA: Diagnosis not present

## 2016-04-24 NOTE — ED Provider Notes (Signed)
CSN: [id number]     Arrival date & time 02/27/16  1454 History   First MD Initiated Contact with Patient 02/27/16 1525     Chief Complaint  Patient presents with  . Tick Removal    Pt removed tick from his right hip on Saturday. Afterward area turned red and had some swelling. Today has had some joint pain. 5/10   (Consider location/radiation/quality/duration/timing/severity/associated sxs/prior Treatment) HPI Comments: 70 yo male with a c/o right hip tick bite 2 days ago with subsequent development of progressively worsening surrounding erythema. (not "bull's eye"). Denies any fevers, chills, drainage. States tick embedded less than 24 hours and was not engorged.  The history is provided by the patient.    Past Medical History  Diagnosis Date  . Arthritis   . Coronary artery disease   . Diabetes mellitus without complication ([place])   . Hypertension   . Thyroid disease   . GERD (gastroesophageal reflux disease)    Past Surgical History  Procedure Laterality Date  . Back surgery    . Hernia repair    . Hydrocele excision / repair    . Tonsillectomy    . Joint replacement     Family History  Problem Relation Age of Onset  . Stroke Father    Social History  Substance Use Topics  . Smoking status: Former Research scientist (life sciences)  . Smokeless tobacco: None  . Alcohol Use: Yes     Comment: rare    Review of Systems  Allergies  Niacin and related and Statins  Home Medications   Prior to Admission medications   Medication Sig Start Date End Date Taking? Authorizing Provider  alfuzosin (UROXATRAL) 10 MG 24 hr tablet Take 10 mg by mouth daily with breakfast.   Yes Historical Provider, MD  aspirin EC 325 MG tablet Take 325 mg by mouth daily.   Yes Historical Provider, MD  ezetimibe (ZETIA) 10 MG tablet Take 10 mg by mouth daily.   Yes Historical Provider, MD  glipiZIDE (GLUCOTROL) 10 MG tablet Take 10 mg by mouth 2 (two) times daily before a meal.   Yes Historical Provider, MD   hydrochlorothiazide (HYDRODIURIL) 25 MG tablet Take 25 mg by mouth daily.   Yes Historical Provider, MD  insulin detemir (LEVEMIR) 100 UNIT/ML injection Inject 21 Units into the skin at bedtime.   Yes Historical Provider, MD  levothyroxine (SYNTHROID, LEVOTHROID) 75 MCG tablet Take 75 mcg by mouth daily before breakfast.   Yes Historical Provider, MD  lisinopril (PRINIVIL,ZESTRIL) 10 MG tablet Take 10 mg by mouth daily.   Yes Historical Provider, MD  magnesium oxide (MAG-OX) 400 MG tablet Take 400 mg by mouth daily.   Yes Historical Provider, MD  metFORMIN (GLUCOPHAGE) 1000 MG tablet Take 1,000 mg by mouth 2 (two) times daily with a meal.   Yes Historical Provider, MD  metoprolol succinate (TOPROL-XL) 25 MG 24 hr tablet Take 25 mg by mouth 2 (two) times daily.   Yes Historical Provider, MD  omeprazole (PRILOSEC) 40 MG capsule Take 40 mg by mouth daily.   Yes Historical Provider, MD  rosuvastatin (CRESTOR) 5 MG tablet Take 5 mg by mouth daily.   Yes Historical Provider, MD  tadalafil (CIALIS) 5 MG tablet Take 5 mg by mouth daily as needed for erectile dysfunction.   Yes Historical Provider, MD  valACYclovir (VALTREX) 500 MG tablet Take 500 mg by mouth 2 (two) times daily.   Yes Historical Provider, MD  guaiFENesin-codeine 100-10 MG/5ML syrup Take 10 mLs by  mouth every 6 (six) hours as needed for cough. 12/29/15   Norval Gable, MD  insulin glargine (LANTUS) 100 UNIT/ML injection Inject 25 Units into the skin at bedtime.    Historical Provider, MD  sulfamethoxazole-trimethoprim (BACTRIM DS,SEPTRA DS) 800-160 MG tablet Take 1 tablet by mouth 2 (two) times daily. 02/27/16   Norval Gable, MD   Meds Ordered and Administered this Visit  Medications - No data to display  BP 103/64 mmHg  Pulse 56  Temp(Src) 97.7 F (36.5 C) (Oral)  Resp 20  Ht 5\' 7"  (1.702 m)  Wt 190 lb (86.183 kg)  BMI 29.75 kg/m2  SpO2 99% No data found.   Physical Exam  Constitutional: He appears well-developed and  well-nourished. No distress.  Skin: He is not diaphoretic. There is erythema.  Right hip skin with pinpoint puncture wound with mild surrounding blanchable erythema, warmth and tenderness to palpation  Nursing note and vitals reviewed.   ED Course  Procedures (including critical care time)  Labs Review Labs Reviewed - No data to display  Imaging Review No results found.   Visual Acuity Review  Right Eye Distance:   Left Eye Distance:   Bilateral Distance:    Right Eye Near:   Left Eye Near:    Bilateral Near:         MDM   1. Cellulitis of right lower extremity    Discharge Medication List as of 02/27/2016  3:53 PM    START taking these medications   Details  sulfamethoxazole-trimethoprim (BACTRIM DS,SEPTRA DS) 800-160 MG tablet Take 1 tablet by mouth 2 (two) times daily., Starting 02/27/2016, Until Discontinued, Normal      \ 1. diagnosis reviewed with patient/ 2. rx as per orders above; reviewed possible side effects, interactions, risks and benefits  3. Recommend supportive treatment with warm compresses 4. Follow-up prn if symptoms worsen or don't improve    Norval Gable, MD 04/24/16 1702

## 2016-05-01 DIAGNOSIS — M7632 Iliotibial band syndrome, left leg: Secondary | ICD-10-CM | POA: Diagnosis not present

## 2016-05-10 DIAGNOSIS — F4321 Adjustment disorder with depressed mood: Secondary | ICD-10-CM | POA: Diagnosis not present

## 2016-05-15 DIAGNOSIS — F4321 Adjustment disorder with depressed mood: Secondary | ICD-10-CM | POA: Diagnosis not present

## 2016-05-21 DIAGNOSIS — M79641 Pain in right hand: Secondary | ICD-10-CM | POA: Diagnosis not present

## 2016-05-21 DIAGNOSIS — M79642 Pain in left hand: Secondary | ICD-10-CM | POA: Diagnosis not present

## 2016-05-28 DIAGNOSIS — F4321 Adjustment disorder with depressed mood: Secondary | ICD-10-CM | POA: Diagnosis not present

## 2016-06-04 DIAGNOSIS — E6609 Other obesity due to excess calories: Secondary | ICD-10-CM | POA: Diagnosis not present

## 2016-06-04 DIAGNOSIS — I2510 Atherosclerotic heart disease of native coronary artery without angina pectoris: Secondary | ICD-10-CM | POA: Diagnosis not present

## 2016-06-04 DIAGNOSIS — I10 Essential (primary) hypertension: Secondary | ICD-10-CM | POA: Diagnosis not present

## 2016-06-04 DIAGNOSIS — M79641 Pain in right hand: Secondary | ICD-10-CM | POA: Diagnosis not present

## 2016-06-04 DIAGNOSIS — M79642 Pain in left hand: Secondary | ICD-10-CM | POA: Diagnosis not present

## 2016-06-04 DIAGNOSIS — E785 Hyperlipidemia, unspecified: Secondary | ICD-10-CM | POA: Diagnosis not present

## 2016-06-06 DIAGNOSIS — F4321 Adjustment disorder with depressed mood: Secondary | ICD-10-CM | POA: Diagnosis not present

## 2016-06-11 DIAGNOSIS — F4321 Adjustment disorder with depressed mood: Secondary | ICD-10-CM | POA: Diagnosis not present

## 2016-06-18 DIAGNOSIS — F4321 Adjustment disorder with depressed mood: Secondary | ICD-10-CM | POA: Diagnosis not present

## 2016-07-03 DIAGNOSIS — E119 Type 2 diabetes mellitus without complications: Secondary | ICD-10-CM | POA: Diagnosis not present

## 2016-07-03 DIAGNOSIS — H47233 Glaucomatous optic atrophy, bilateral: Secondary | ICD-10-CM | POA: Diagnosis not present

## 2016-07-03 DIAGNOSIS — H2513 Age-related nuclear cataract, bilateral: Secondary | ICD-10-CM | POA: Diagnosis not present

## 2016-07-04 DIAGNOSIS — L720 Epidermal cyst: Secondary | ICD-10-CM | POA: Diagnosis not present

## 2016-07-04 DIAGNOSIS — I788 Other diseases of capillaries: Secondary | ICD-10-CM | POA: Diagnosis not present

## 2016-07-04 DIAGNOSIS — L304 Erythema intertrigo: Secondary | ICD-10-CM | POA: Diagnosis not present

## 2016-07-04 DIAGNOSIS — L821 Other seborrheic keratosis: Secondary | ICD-10-CM | POA: Diagnosis not present

## 2016-07-04 DIAGNOSIS — L918 Other hypertrophic disorders of the skin: Secondary | ICD-10-CM | POA: Diagnosis not present

## 2016-07-04 DIAGNOSIS — L218 Other seborrheic dermatitis: Secondary | ICD-10-CM | POA: Diagnosis not present

## 2016-07-04 DIAGNOSIS — L820 Inflamed seborrheic keratosis: Secondary | ICD-10-CM | POA: Diagnosis not present

## 2016-07-04 DIAGNOSIS — L281 Prurigo nodularis: Secondary | ICD-10-CM | POA: Diagnosis not present

## 2016-07-04 DIAGNOSIS — D225 Melanocytic nevi of trunk: Secondary | ICD-10-CM | POA: Diagnosis not present

## 2016-07-04 DIAGNOSIS — L570 Actinic keratosis: Secondary | ICD-10-CM | POA: Diagnosis not present

## 2016-07-04 DIAGNOSIS — D485 Neoplasm of uncertain behavior of skin: Secondary | ICD-10-CM | POA: Diagnosis not present

## 2016-07-09 DIAGNOSIS — F4321 Adjustment disorder with depressed mood: Secondary | ICD-10-CM | POA: Diagnosis not present

## 2016-07-16 DIAGNOSIS — F4321 Adjustment disorder with depressed mood: Secondary | ICD-10-CM | POA: Diagnosis not present

## 2016-07-18 DIAGNOSIS — H25041 Posterior subcapsular polar age-related cataract, right eye: Secondary | ICD-10-CM | POA: Diagnosis not present

## 2016-07-18 DIAGNOSIS — E119 Type 2 diabetes mellitus without complications: Secondary | ICD-10-CM | POA: Diagnosis not present

## 2016-07-18 DIAGNOSIS — H2513 Age-related nuclear cataract, bilateral: Secondary | ICD-10-CM | POA: Diagnosis not present

## 2016-07-18 DIAGNOSIS — Z23 Encounter for immunization: Secondary | ICD-10-CM | POA: Diagnosis not present

## 2016-07-30 DIAGNOSIS — F4321 Adjustment disorder with depressed mood: Secondary | ICD-10-CM | POA: Diagnosis not present

## 2016-08-06 DIAGNOSIS — F4321 Adjustment disorder with depressed mood: Secondary | ICD-10-CM | POA: Diagnosis not present

## 2016-08-13 DIAGNOSIS — F4321 Adjustment disorder with depressed mood: Secondary | ICD-10-CM | POA: Diagnosis not present

## 2016-08-20 DIAGNOSIS — F4321 Adjustment disorder with depressed mood: Secondary | ICD-10-CM | POA: Diagnosis not present

## 2016-08-22 DIAGNOSIS — M4326 Fusion of spine, lumbar region: Secondary | ICD-10-CM | POA: Diagnosis not present

## 2016-08-22 DIAGNOSIS — M545 Low back pain: Secondary | ICD-10-CM | POA: Diagnosis not present

## 2016-08-22 DIAGNOSIS — M549 Dorsalgia, unspecified: Secondary | ICD-10-CM | POA: Diagnosis not present

## 2016-08-26 DIAGNOSIS — M545 Low back pain: Secondary | ICD-10-CM | POA: Diagnosis not present

## 2016-08-26 DIAGNOSIS — M6281 Muscle weakness (generalized): Secondary | ICD-10-CM | POA: Diagnosis not present

## 2016-09-03 DIAGNOSIS — F4321 Adjustment disorder with depressed mood: Secondary | ICD-10-CM | POA: Diagnosis not present

## 2016-09-05 DIAGNOSIS — M545 Low back pain: Secondary | ICD-10-CM | POA: Diagnosis not present

## 2016-09-05 DIAGNOSIS — M6281 Muscle weakness (generalized): Secondary | ICD-10-CM | POA: Diagnosis not present

## 2016-09-10 DIAGNOSIS — F4321 Adjustment disorder with depressed mood: Secondary | ICD-10-CM | POA: Diagnosis not present

## 2016-09-12 DIAGNOSIS — M545 Low back pain: Secondary | ICD-10-CM | POA: Diagnosis not present

## 2016-09-20 DIAGNOSIS — M545 Low back pain: Secondary | ICD-10-CM | POA: Diagnosis not present

## 2016-09-24 DIAGNOSIS — F4321 Adjustment disorder with depressed mood: Secondary | ICD-10-CM | POA: Diagnosis not present

## 2016-10-01 DIAGNOSIS — F4321 Adjustment disorder with depressed mood: Secondary | ICD-10-CM | POA: Diagnosis not present

## 2016-10-02 DIAGNOSIS — N179 Acute kidney failure, unspecified: Secondary | ICD-10-CM | POA: Diagnosis not present

## 2016-10-02 DIAGNOSIS — E119 Type 2 diabetes mellitus without complications: Secondary | ICD-10-CM | POA: Diagnosis not present

## 2016-10-02 DIAGNOSIS — I10 Essential (primary) hypertension: Secondary | ICD-10-CM | POA: Diagnosis not present

## 2016-10-04 DIAGNOSIS — M545 Low back pain: Secondary | ICD-10-CM | POA: Diagnosis not present

## 2016-10-08 DIAGNOSIS — F4321 Adjustment disorder with depressed mood: Secondary | ICD-10-CM | POA: Diagnosis not present

## 2016-10-11 DIAGNOSIS — M6281 Muscle weakness (generalized): Secondary | ICD-10-CM | POA: Diagnosis not present

## 2016-10-11 DIAGNOSIS — M545 Low back pain: Secondary | ICD-10-CM | POA: Diagnosis not present

## 2016-10-15 DIAGNOSIS — F4321 Adjustment disorder with depressed mood: Secondary | ICD-10-CM | POA: Diagnosis not present

## 2016-10-18 DIAGNOSIS — M6281 Muscle weakness (generalized): Secondary | ICD-10-CM | POA: Diagnosis not present

## 2016-10-18 DIAGNOSIS — M545 Low back pain: Secondary | ICD-10-CM | POA: Diagnosis not present

## 2016-10-22 DIAGNOSIS — F4321 Adjustment disorder with depressed mood: Secondary | ICD-10-CM | POA: Diagnosis not present

## 2016-10-29 DIAGNOSIS — F4321 Adjustment disorder with depressed mood: Secondary | ICD-10-CM | POA: Diagnosis not present

## 2016-11-04 DIAGNOSIS — M545 Low back pain: Secondary | ICD-10-CM | POA: Diagnosis not present

## 2016-11-04 DIAGNOSIS — M6281 Muscle weakness (generalized): Secondary | ICD-10-CM | POA: Diagnosis not present

## 2016-11-05 DIAGNOSIS — F4321 Adjustment disorder with depressed mood: Secondary | ICD-10-CM | POA: Diagnosis not present

## 2016-11-12 DIAGNOSIS — F4321 Adjustment disorder with depressed mood: Secondary | ICD-10-CM | POA: Diagnosis not present

## 2016-11-14 DIAGNOSIS — E039 Hypothyroidism, unspecified: Secondary | ICD-10-CM | POA: Diagnosis not present

## 2016-11-14 DIAGNOSIS — E785 Hyperlipidemia, unspecified: Secondary | ICD-10-CM | POA: Diagnosis not present

## 2016-11-14 DIAGNOSIS — I10 Essential (primary) hypertension: Secondary | ICD-10-CM | POA: Diagnosis not present

## 2016-11-14 DIAGNOSIS — E119 Type 2 diabetes mellitus without complications: Secondary | ICD-10-CM | POA: Diagnosis not present

## 2016-11-14 DIAGNOSIS — E612 Magnesium deficiency: Secondary | ICD-10-CM | POA: Diagnosis not present

## 2016-11-18 DIAGNOSIS — M533 Sacrococcygeal disorders, not elsewhere classified: Secondary | ICD-10-CM | POA: Diagnosis not present

## 2016-11-19 DIAGNOSIS — F4321 Adjustment disorder with depressed mood: Secondary | ICD-10-CM | POA: Diagnosis not present

## 2016-11-26 DIAGNOSIS — F4321 Adjustment disorder with depressed mood: Secondary | ICD-10-CM | POA: Diagnosis not present

## 2016-12-09 DIAGNOSIS — Z87448 Personal history of other diseases of urinary system: Secondary | ICD-10-CM | POA: Diagnosis not present

## 2016-12-09 DIAGNOSIS — E785 Hyperlipidemia, unspecified: Secondary | ICD-10-CM | POA: Diagnosis not present

## 2016-12-09 DIAGNOSIS — I2510 Atherosclerotic heart disease of native coronary artery without angina pectoris: Secondary | ICD-10-CM | POA: Diagnosis not present

## 2016-12-09 DIAGNOSIS — I10 Essential (primary) hypertension: Secondary | ICD-10-CM | POA: Diagnosis not present

## 2016-12-10 DIAGNOSIS — F4321 Adjustment disorder with depressed mood: Secondary | ICD-10-CM | POA: Diagnosis not present

## 2016-12-17 DIAGNOSIS — F4321 Adjustment disorder with depressed mood: Secondary | ICD-10-CM | POA: Diagnosis not present

## 2016-12-24 DIAGNOSIS — F4321 Adjustment disorder with depressed mood: Secondary | ICD-10-CM | POA: Diagnosis not present

## 2016-12-31 DIAGNOSIS — F4321 Adjustment disorder with depressed mood: Secondary | ICD-10-CM | POA: Diagnosis not present

## 2017-01-14 DIAGNOSIS — F4321 Adjustment disorder with depressed mood: Secondary | ICD-10-CM | POA: Diagnosis not present

## 2017-01-21 DIAGNOSIS — F4321 Adjustment disorder with depressed mood: Secondary | ICD-10-CM | POA: Diagnosis not present

## 2017-01-30 DIAGNOSIS — R350 Frequency of micturition: Secondary | ICD-10-CM | POA: Diagnosis not present

## 2017-01-30 DIAGNOSIS — N410 Acute prostatitis: Secondary | ICD-10-CM | POA: Diagnosis not present

## 2017-01-30 DIAGNOSIS — Z0289 Encounter for other administrative examinations: Secondary | ICD-10-CM | POA: Diagnosis not present

## 2017-01-30 DIAGNOSIS — K6289 Other specified diseases of anus and rectum: Secondary | ICD-10-CM | POA: Diagnosis not present

## 2017-01-30 DIAGNOSIS — Z87448 Personal history of other diseases of urinary system: Secondary | ICD-10-CM | POA: Diagnosis not present

## 2017-01-30 DIAGNOSIS — N401 Enlarged prostate with lower urinary tract symptoms: Secondary | ICD-10-CM | POA: Diagnosis not present

## 2017-02-06 DIAGNOSIS — I10 Essential (primary) hypertension: Secondary | ICD-10-CM | POA: Diagnosis not present

## 2017-02-06 DIAGNOSIS — N410 Acute prostatitis: Secondary | ICD-10-CM | POA: Diagnosis not present

## 2017-02-10 DIAGNOSIS — L218 Other seborrheic dermatitis: Secondary | ICD-10-CM | POA: Diagnosis not present

## 2017-02-10 DIAGNOSIS — D229 Melanocytic nevi, unspecified: Secondary | ICD-10-CM | POA: Diagnosis not present

## 2017-03-10 DIAGNOSIS — B351 Tinea unguium: Secondary | ICD-10-CM | POA: Diagnosis not present

## 2017-03-10 DIAGNOSIS — Z87891 Personal history of nicotine dependence: Secondary | ICD-10-CM | POA: Diagnosis not present

## 2017-03-10 DIAGNOSIS — M79674 Pain in right toe(s): Secondary | ICD-10-CM | POA: Diagnosis not present

## 2017-03-10 DIAGNOSIS — E1165 Type 2 diabetes mellitus with hyperglycemia: Secondary | ICD-10-CM | POA: Diagnosis not present

## 2017-03-10 DIAGNOSIS — E039 Hypothyroidism, unspecified: Secondary | ICD-10-CM | POA: Diagnosis not present

## 2017-03-10 DIAGNOSIS — R110 Nausea: Secondary | ICD-10-CM | POA: Diagnosis not present

## 2017-03-10 DIAGNOSIS — R1031 Right lower quadrant pain: Secondary | ICD-10-CM | POA: Diagnosis not present

## 2017-03-10 DIAGNOSIS — K219 Gastro-esophageal reflux disease without esophagitis: Secondary | ICD-10-CM | POA: Diagnosis not present

## 2017-03-10 DIAGNOSIS — R509 Fever, unspecified: Secondary | ICD-10-CM | POA: Diagnosis not present

## 2017-03-10 DIAGNOSIS — M79672 Pain in left foot: Secondary | ICD-10-CM | POA: Diagnosis not present

## 2017-03-10 DIAGNOSIS — I701 Atherosclerosis of renal artery: Secondary | ICD-10-CM | POA: Diagnosis not present

## 2017-03-10 DIAGNOSIS — I4430 Unspecified atrioventricular block: Secondary | ICD-10-CM | POA: Diagnosis not present

## 2017-03-10 DIAGNOSIS — K8020 Calculus of gallbladder without cholecystitis without obstruction: Secondary | ICD-10-CM | POA: Diagnosis not present

## 2017-03-10 DIAGNOSIS — I10 Essential (primary) hypertension: Secondary | ICD-10-CM | POA: Diagnosis not present

## 2017-03-10 DIAGNOSIS — Z981 Arthrodesis status: Secondary | ICD-10-CM | POA: Diagnosis not present

## 2017-03-10 DIAGNOSIS — Z79899 Other long term (current) drug therapy: Secondary | ICD-10-CM | POA: Diagnosis not present

## 2017-03-10 DIAGNOSIS — E785 Hyperlipidemia, unspecified: Secondary | ICD-10-CM | POA: Diagnosis not present

## 2017-03-10 DIAGNOSIS — B349 Viral infection, unspecified: Secondary | ICD-10-CM | POA: Diagnosis not present

## 2017-03-10 DIAGNOSIS — Z7982 Long term (current) use of aspirin: Secondary | ICD-10-CM | POA: Diagnosis not present

## 2017-03-10 DIAGNOSIS — Z951 Presence of aortocoronary bypass graft: Secondary | ICD-10-CM | POA: Diagnosis not present

## 2017-03-10 DIAGNOSIS — M79675 Pain in left toe(s): Secondary | ICD-10-CM | POA: Diagnosis not present

## 2017-03-10 DIAGNOSIS — Z794 Long term (current) use of insulin: Secondary | ICD-10-CM | POA: Diagnosis not present

## 2017-03-12 DIAGNOSIS — Z125 Encounter for screening for malignant neoplasm of prostate: Secondary | ICD-10-CM | POA: Diagnosis not present

## 2017-03-12 DIAGNOSIS — N400 Enlarged prostate without lower urinary tract symptoms: Secondary | ICD-10-CM | POA: Diagnosis not present

## 2017-03-26 DIAGNOSIS — E119 Type 2 diabetes mellitus without complications: Secondary | ICD-10-CM | POA: Diagnosis not present

## 2017-03-26 DIAGNOSIS — I10 Essential (primary) hypertension: Secondary | ICD-10-CM | POA: Diagnosis not present

## 2017-03-26 DIAGNOSIS — N183 Chronic kidney disease, stage 3 (moderate): Secondary | ICD-10-CM | POA: Diagnosis not present

## 2017-04-21 DIAGNOSIS — H5203 Hypermetropia, bilateral: Secondary | ICD-10-CM | POA: Insufficient documentation

## 2017-04-21 DIAGNOSIS — H524 Presbyopia: Secondary | ICD-10-CM | POA: Insufficient documentation

## 2017-04-21 DIAGNOSIS — E1165 Type 2 diabetes mellitus with hyperglycemia: Secondary | ICD-10-CM | POA: Diagnosis not present

## 2017-04-21 DIAGNOSIS — H52203 Unspecified astigmatism, bilateral: Secondary | ICD-10-CM

## 2017-04-21 DIAGNOSIS — E1159 Type 2 diabetes mellitus with other circulatory complications: Secondary | ICD-10-CM | POA: Insufficient documentation

## 2017-04-21 DIAGNOSIS — E119 Type 2 diabetes mellitus without complications: Secondary | ICD-10-CM | POA: Insufficient documentation

## 2017-04-21 DIAGNOSIS — E1169 Type 2 diabetes mellitus with other specified complication: Secondary | ICD-10-CM | POA: Insufficient documentation

## 2017-04-21 DIAGNOSIS — H2513 Age-related nuclear cataract, bilateral: Secondary | ICD-10-CM | POA: Diagnosis not present

## 2017-04-21 DIAGNOSIS — E11649 Type 2 diabetes mellitus with hypoglycemia without coma: Secondary | ICD-10-CM | POA: Insufficient documentation

## 2017-04-21 DIAGNOSIS — E089 Diabetes mellitus due to underlying condition without complications: Secondary | ICD-10-CM | POA: Insufficient documentation

## 2017-05-08 DIAGNOSIS — L218 Other seborrheic dermatitis: Secondary | ICD-10-CM | POA: Diagnosis not present

## 2017-05-08 DIAGNOSIS — L281 Prurigo nodularis: Secondary | ICD-10-CM | POA: Diagnosis not present

## 2017-05-12 DIAGNOSIS — Z888 Allergy status to other drugs, medicaments and biological substances status: Secondary | ICD-10-CM | POA: Diagnosis not present

## 2017-05-12 DIAGNOSIS — E039 Hypothyroidism, unspecified: Secondary | ICD-10-CM | POA: Diagnosis not present

## 2017-05-12 DIAGNOSIS — Z87891 Personal history of nicotine dependence: Secondary | ICD-10-CM | POA: Diagnosis not present

## 2017-05-12 DIAGNOSIS — Z951 Presence of aortocoronary bypass graft: Secondary | ICD-10-CM | POA: Diagnosis not present

## 2017-05-12 DIAGNOSIS — E1165 Type 2 diabetes mellitus with hyperglycemia: Secondary | ICD-10-CM | POA: Diagnosis not present

## 2017-05-12 DIAGNOSIS — Z833 Family history of diabetes mellitus: Secondary | ICD-10-CM | POA: Diagnosis not present

## 2017-05-12 DIAGNOSIS — Z79899 Other long term (current) drug therapy: Secondary | ICD-10-CM | POA: Diagnosis not present

## 2017-05-12 DIAGNOSIS — Z7982 Long term (current) use of aspirin: Secondary | ICD-10-CM | POA: Diagnosis not present

## 2017-05-12 DIAGNOSIS — I252 Old myocardial infarction: Secondary | ICD-10-CM | POA: Diagnosis not present

## 2017-05-12 DIAGNOSIS — Z794 Long term (current) use of insulin: Secondary | ICD-10-CM | POA: Diagnosis not present

## 2017-05-12 DIAGNOSIS — E119 Type 2 diabetes mellitus without complications: Secondary | ICD-10-CM | POA: Diagnosis not present

## 2017-05-12 DIAGNOSIS — E785 Hyperlipidemia, unspecified: Secondary | ICD-10-CM | POA: Diagnosis not present

## 2017-06-11 DIAGNOSIS — Z23 Encounter for immunization: Secondary | ICD-10-CM | POA: Diagnosis not present

## 2017-06-11 DIAGNOSIS — E039 Hypothyroidism, unspecified: Secondary | ICD-10-CM | POA: Diagnosis not present

## 2017-07-03 DIAGNOSIS — L448 Other specified papulosquamous disorders: Secondary | ICD-10-CM | POA: Diagnosis not present

## 2017-07-03 DIAGNOSIS — L218 Other seborrheic dermatitis: Secondary | ICD-10-CM | POA: Diagnosis not present

## 2017-07-03 DIAGNOSIS — D485 Neoplasm of uncertain behavior of skin: Secondary | ICD-10-CM | POA: Diagnosis not present

## 2017-07-03 DIAGNOSIS — L814 Other melanin hyperpigmentation: Secondary | ICD-10-CM | POA: Diagnosis not present

## 2017-07-03 DIAGNOSIS — L821 Other seborrheic keratosis: Secondary | ICD-10-CM | POA: Diagnosis not present

## 2017-07-03 DIAGNOSIS — L568 Other specified acute skin changes due to ultraviolet radiation: Secondary | ICD-10-CM | POA: Diagnosis not present

## 2017-07-03 DIAGNOSIS — S80861A Insect bite (nonvenomous), right lower leg, initial encounter: Secondary | ICD-10-CM | POA: Diagnosis not present

## 2017-07-03 DIAGNOSIS — S80862A Insect bite (nonvenomous), left lower leg, initial encounter: Secondary | ICD-10-CM | POA: Diagnosis not present

## 2017-07-03 DIAGNOSIS — X32XXXA Exposure to sunlight, initial encounter: Secondary | ICD-10-CM | POA: Diagnosis not present

## 2017-07-03 DIAGNOSIS — D225 Melanocytic nevi of trunk: Secondary | ICD-10-CM | POA: Diagnosis not present

## 2017-07-31 DIAGNOSIS — Z951 Presence of aortocoronary bypass graft: Secondary | ICD-10-CM | POA: Diagnosis not present

## 2017-07-31 DIAGNOSIS — R079 Chest pain, unspecified: Secondary | ICD-10-CM | POA: Diagnosis not present

## 2017-07-31 DIAGNOSIS — R918 Other nonspecific abnormal finding of lung field: Secondary | ICD-10-CM | POA: Diagnosis not present

## 2017-07-31 DIAGNOSIS — K8020 Calculus of gallbladder without cholecystitis without obstruction: Secondary | ICD-10-CM | POA: Diagnosis not present

## 2017-07-31 DIAGNOSIS — I25119 Atherosclerotic heart disease of native coronary artery with unspecified angina pectoris: Secondary | ICD-10-CM | POA: Diagnosis not present

## 2017-07-31 DIAGNOSIS — E1140 Type 2 diabetes mellitus with diabetic neuropathy, unspecified: Secondary | ICD-10-CM | POA: Diagnosis not present

## 2017-07-31 DIAGNOSIS — I517 Cardiomegaly: Secondary | ICD-10-CM | POA: Diagnosis not present

## 2017-07-31 DIAGNOSIS — K219 Gastro-esophageal reflux disease without esophagitis: Secondary | ICD-10-CM | POA: Diagnosis not present

## 2017-07-31 DIAGNOSIS — Z87891 Personal history of nicotine dependence: Secondary | ICD-10-CM | POA: Diagnosis not present

## 2017-07-31 DIAGNOSIS — R0789 Other chest pain: Secondary | ICD-10-CM | POA: Diagnosis not present

## 2017-07-31 DIAGNOSIS — R0989 Other specified symptoms and signs involving the circulatory and respiratory systems: Secondary | ICD-10-CM | POA: Diagnosis not present

## 2017-08-01 DIAGNOSIS — R079 Chest pain, unspecified: Secondary | ICD-10-CM | POA: Diagnosis not present

## 2017-09-11 DIAGNOSIS — E039 Hypothyroidism, unspecified: Secondary | ICD-10-CM | POA: Diagnosis not present

## 2017-09-24 DIAGNOSIS — N183 Chronic kidney disease, stage 3 (moderate): Secondary | ICD-10-CM | POA: Diagnosis not present

## 2017-09-24 DIAGNOSIS — I10 Essential (primary) hypertension: Secondary | ICD-10-CM | POA: Diagnosis not present

## 2017-09-24 DIAGNOSIS — E119 Type 2 diabetes mellitus without complications: Secondary | ICD-10-CM | POA: Diagnosis not present

## 2017-09-25 DIAGNOSIS — M6240 Contracture of muscle, unspecified site: Secondary | ICD-10-CM | POA: Diagnosis not present

## 2017-09-25 DIAGNOSIS — J069 Acute upper respiratory infection, unspecified: Secondary | ICD-10-CM | POA: Diagnosis not present

## 2017-09-25 DIAGNOSIS — N489 Disorder of penis, unspecified: Secondary | ICD-10-CM | POA: Diagnosis not present

## 2017-10-01 ENCOUNTER — Ambulatory Visit
Admission: EM | Admit: 2017-10-01 | Discharge: 2017-10-01 | Disposition: A | Discharge status: Home / Self Care | Payer: Medicare Other | Attending: Emergency Medicine | Admitting: Emergency Medicine

## 2017-10-01 ENCOUNTER — Other Ambulatory Visit: Payer: Self-pay

## 2017-10-01 DIAGNOSIS — R059 Cough, unspecified: Secondary | ICD-10-CM

## 2017-10-01 DIAGNOSIS — J0100 Acute maxillary sinusitis, unspecified: Secondary | ICD-10-CM | POA: Diagnosis not present

## 2017-10-01 DIAGNOSIS — R05 Cough: Secondary | ICD-10-CM | POA: Diagnosis not present

## 2017-10-01 MED ORDER — DOXYCYCLINE HYCLATE 100 MG PO CAPS: 100.0000 mg | ORAL_CAPSULE | Freq: Two times a day (BID) | ORAL | 0 refills | Status: DC

## 2017-10-01 MED ORDER — BENZONATATE 100 MG PO CAPS: 100.0000 mg | ORAL_CAPSULE | Freq: Three times a day (TID) | ORAL | 0 refills | Status: DC | PRN

## 2017-10-01 NOTE — ED Triage Notes (Signed)
Patient complains of cough, headache, sinus and pressure x 2 weeks.

## 2017-10-01 NOTE — Discharge Instructions (Signed)
Take medication as prescribed. Rest. Drink plenty of fluids.   Continue to monitor blood pressure closely as discussed and follow up.  Follow up with your primary care physician this week as discussed. Return to Urgent care for new or worsening concerns.

## 2017-10-01 NOTE — ED Provider Notes (Signed)
[facility] ____________________________________________  Time seen: Approximately 3:35 PM  I have reviewed the triage vital signs and the nursing notes.   HISTORY  Chief Complaint Cough   HPI Ryan Herrera is a 71 y.o. male presenting with wife at bedside for evaluation of 2 weeks of cough, nasal congestion, sinus pain and sinus pressure.  Patient reports he initially started out with more of a cough and sore throat, but reports the last few days the sinus pressure has increased.  States nasal drainage has decreased and he feels like sinuses are more clogged with associated pressure around his cheeks.  States the cheek pressure even causes his teeth to hurt.  Denies any known fevers.  Reports he does have some soreness from coughing.   states has not eaten as much due to decreased appetite and food not tasting as good, reports continues to drink fluids well.  States symptoms unresolved with some over-the-counter Coricidin as well as Cheratussin that he had had leftover.  Patient again states that the cough is improved.  Denies other aggravating or alleviating factors. Denies chest pain, shortness of breath, abdominal pain, syncope, near syncope, dizziness, vision changes, unilateral weakness or rash. Denies recent sickness. Denies recent antibiotic use.  Denies home sick contacts.  Gayland Curry, MD: PCP   Past Medical History:  Diagnosis Date  . Arthritis   . Coronary artery disease   . Diabetes mellitus without complication ([place])   . GERD (gastroesophageal reflux disease)   . Hypertension   . Thyroid disease     There are no active problems to display for this patient.   Past Surgical History:  Procedure Laterality Date  . BACK SURGERY    . HERNIA REPAIR    . HYDROCELE EXCISION / REPAIR    . JOINT REPLACEMENT    . TONSILLECTOMY       No current facility-administered medications for this encounter.   Current Outpatient Medications:  .   alfuzosin (UROXATRAL) 10 MG 24 hr tablet, Take 10 mg by mouth daily with breakfast., Disp: , Rfl:  .  aspirin EC 325 MG tablet, Take 325 mg by mouth daily., Disp: , Rfl:  .  ezetimibe (ZETIA) 10 MG tablet, Take 10 mg by mouth daily., Disp: , Rfl:  .  glipiZIDE (GLUCOTROL) 10 MG tablet, Take 10 mg by mouth 2 (two) times daily before a meal., Disp: , Rfl:  .  hydrochlorothiazide (HYDRODIURIL) 25 MG tablet, Take 25 mg by mouth daily., Disp: , Rfl:  .  insulin detemir (LEVEMIR) 100 UNIT/ML injection, Inject 21 Units into the skin at bedtime., Disp: , Rfl:  .  levothyroxine (SYNTHROID, LEVOTHROID) 75 MCG tablet, Take 88 mcg by mouth daily before breakfast. , Disp: , Rfl:  .  lisinopril (PRINIVIL,ZESTRIL) 10 MG tablet, Take 10 mg by mouth daily., Disp: , Rfl:  .  magnesium oxide (MAG-OX) 400 MG tablet, Take 400 mg by mouth daily., Disp: , Rfl:  .  metFORMIN (GLUCOPHAGE) 1000 MG tablet, Take 1,000 mg by mouth 2 (two) times daily with a meal., Disp: , Rfl:  .  metoprolol succinate (TOPROL-XL) 25 MG 24 hr tablet, Take 25 mg by mouth 2 (two) times daily., Disp: , Rfl:  .  omeprazole (PRILOSEC) 40 MG capsule, Take 40 mg by mouth daily., Disp: , Rfl:  .  rosuvastatin (CRESTOR) 5 MG tablet, Take 5 mg by mouth daily., Disp: , Rfl:  .  tadalafil (CIALIS) 5 MG tablet, Take 5 mg by mouth daily as  needed for erectile dysfunction., Disp: , Rfl:  .  valACYclovir (VALTREX) 500 MG tablet, Take 500 mg by mouth 2 (two) times daily., Disp: , Rfl:  .  benzonatate (TESSALON PERLES) 100 MG capsule, Take 1 capsule (100 mg total) by mouth 3 (three) times daily as needed for cough., Disp: 15 capsule, Rfl: 0 .  doxycycline (VIBRAMYCIN) 100 MG capsule, Take 1 capsule (100 mg total) by mouth 2 (two) times daily., Disp: 20 capsule, Rfl: 0  Allergies Niacin and related and Statins  Family History  Problem Relation Age of Onset  . Stroke Father     Social History Social History   Tobacco Use  . Smoking status: Former  Research scientist (life sciences)  . Smokeless tobacco: Never Used  Substance Use Topics  . Alcohol use: Yes    Comment: rare  . Drug use: No    Review of Systems Constitutional: No fever/chills Eyes: No visual changes. ENT: States initially had a sore throat, states now sore throat is only irritated from coughing. Cardiovascular: Denies chest pain. Respiratory: Denies shortness of breath. Gastrointestinal: No abdominal pain.  No nausea, no vomiting.   Musculoskeletal: Negative for back pain. Skin: Negative for rash. Neurological: Negative for focal weakness or numbness.   ____________________________________________   PHYSICAL EXAM:  VITAL SIGNS: ED Triage Vitals  Enc Vitals Group     BP 10/01/17 1424 (!) 90/59     Pulse Rate 10/01/17 1535 70     Resp 10/01/17 1424 18     Temp 10/01/17 1424 97.8 F (36.6 C)     Temp Source 10/01/17 1424 Oral     SpO2 10/01/17 1424 96 %     Weight 10/01/17 1420 195 lb (88.5 kg)     Height 10/01/17 1420 5' 6.75" (1.695 m)     Head Circumference --      Peak Flow --      Pain Score 10/01/17 1420 2     Pain Loc --      Pain Edu? --      Excl. in [place]? --     Constitutional: Alert and oriented. Well appearing and in no acute distress. Eyes: Conjunctivae are normal. Head: Atraumatic.Mild to moderate tenderness to palpation bilateral frontal and maxillary sinuses. No swelling. No erythema.   Ears: no erythema, normal TMs bilaterally.   Nose: nasal congestion with bilateral nasal turbinate erythema and edema.   Mouth/Throat: Mucous membranes are moist.  Oropharynx non-erythematous.No tonsillar swelling or exudate.  Neck: No stridor.  No cervical spine tenderness to palpation. Hematological/Lymphatic/Immunilogical: No cervical lymphadenopathy. Cardiovascular: Normal rate, regular rhythm. Grossly normal heart sounds.  Good peripheral circulation. Respiratory: Normal respiratory effort.  No retractions. No wheezes, rales or rhonchi. Good air movement.  Occasional dry  cough noted in room. Musculoskeletal: Ambulatory with steady gait.  Bilateral lower extremities nontender no edema noted.  No cervical, thoracic or lumbar tenderness to palpation.  Neurologic:  Normal speech and language. No gross focal neurologic deficits are appreciated. No gait instability. Skin:  Skin is warm, dry and intact. No rash noted. Psychiatric: Mood and affect are normal. Speech and behavior are normal. __________________________________________   LABS (all labs ordered are listed, but only abnormal results are displayed)  Labs Reviewed - No data to display ____________________________________________  PROCEDURES Procedures    INITIAL IMPRESSION / ASSESSMENT AND PLAN / ED COURSE  Pertinent labs & imaging results that were available during my care of the patient were reviewed by me and considered in my medical decision making (see  chart for details).  Well-appearing patient.  No acute distress.  Wife at bedside.  Suspect recent viral upper respiratory infection with cough, concern now for secondary maxillary sinusitis.  Also discussed in detail with patient reviewing patient medications as well as current blood pressure, patient states that the above blood pressure is similar to his normal.  States that he is normally right around 100/60.  Discussed in detail regarding blood pressure with multiple blood pressure medications and potentially leading to consequences such as syncope or near syncope, patient again states that this is similar to his normal blood pressure and feels fine other than his congestion symptoms, and states that he understands our discussion and declines evaluation including blood work of current blood pressure.  Patient states that he has a cardiology appointment in the next few days and will discuss and have this evaluated.  States that he monitors his blood pressure at home and will continue to do so.  Urged prompt reevaluation for any worsening concerns.  Will  treat patient with oral doxycycline and as needed Tessalon Perles.  Encourage rest, fluids, supportive care.  Again discussed very strict follow-up and return parameters.Discussed indication, risks and benefits of medications with patient.  Discussed follow up with Primary care physician this week. Discussed follow up and return parameters including no resolution or any worsening concerns. Patient verbalized understanding and agreed to plan.   ____________________________________________   FINAL CLINICAL IMPRESSION(S) / ED DIAGNOSES  Final diagnoses:  Acute maxillary sinusitis, recurrence not specified  Cough     ED Discharge Orders        Ordered    doxycycline (VIBRAMYCIN) 100 MG capsule  2 times daily     10/01/17 1559    benzonatate (TESSALON PERLES) 100 MG capsule  3 times daily PRN     10/01/17 1559       Note: This dictation was prepared with Dragon dictation along with smaller phrase technology. Any transcriptional errors that result from this process are unintentional.         Marylene Land, NP 10/01/17 [phone]

## 2017-10-02 ENCOUNTER — Ambulatory Visit
Admission: EM | Admit: 2017-10-02 | Discharge: 2017-10-02 | Disposition: A | Discharge status: Home / Self Care | Payer: Medicare Other | Attending: Family Medicine | Admitting: Family Medicine

## 2017-10-02 ENCOUNTER — Other Ambulatory Visit: Payer: Self-pay

## 2017-10-02 DIAGNOSIS — K6289 Other specified diseases of anus and rectum: Secondary | ICD-10-CM | POA: Diagnosis not present

## 2017-10-02 HISTORY — DX: Irritable bowel syndrome, unspecified: K58.9

## 2017-10-02 MED ORDER — POLYETHYLENE GLYCOL 3350 17 GM/SCOOP PO POWD: ORAL | 0 refills | Status: DC

## 2017-10-02 MED ORDER — HYDROCORTISONE ACE-PRAMOXINE 2.5-1 % RE CREA: 1.0000 "application " | TOPICAL_CREAM | Freq: Three times a day (TID) | RECTAL | 0 refills | Status: DC

## 2017-10-02 NOTE — Discharge Instructions (Signed)
Miralax daily.  Continue colace.  Use the cream as prescribed.  Take care  Dr. Lacinda Axon

## 2017-10-02 NOTE — ED Provider Notes (Signed)
[facility]   CSN: [id number] Arrival date & time: 10/02/17  1509  History   Chief Complaint Chief Complaint  Patient presents with  . Rectal Pain   HPI  71 year old male presents with rectal pain.  Patient suffers from IBS.  He states that he has intermittent bouts of constipation and diarrhea.  He reports a recent 5-day history of constipation.  He states that last night he had a extremely large bowel movement.  He states that it was nearly 2 foot in length.  He states that it was hard, long, and large particularly in diameter.  He states that he had difficulty passing the stool as it was extremely hard.  He states that while doing so and afterwards, he has had significant rectal pain.  His pain is currently mild, 2/10 in severity.  He has been using a leftover topical hemorrhoid cream without resolution.  He denies rectal bleeding.  No reports of hemorrhoid.  No known inciting factor for his constipation other than his IBS.  No other associated symptoms.  No other complaints at this time.  Past Medical History:  Diagnosis Date  . Arthritis   . Coronary artery disease   . Diabetes mellitus without complication ([place])   . GERD (gastroesophageal reflux disease)   . Hypertension   . IBS (irritable bowel syndrome)   . Thyroid disease    Past Surgical History:  Procedure Laterality Date  . BACK SURGERY    . HERNIA REPAIR    . HYDROCELE EXCISION / REPAIR    . JOINT REPLACEMENT    . TONSILLECTOMY     Home Medications    Prior to Admission medications   Medication Sig Start Date End Date Taking? Authorizing Provider  alfuzosin (UROXATRAL) 10 MG 24 hr tablet Take 10 mg by mouth daily with breakfast.   Yes [provider]  aspirin EC 325 MG tablet Take 325 mg by mouth daily.   Yes [provider]  ezetimibe (ZETIA) 10 MG tablet Take 10 mg by mouth daily.   Yes [provider]  glipiZIDE (GLUCOTROL) 10 MG tablet Take 10 mg by mouth 2 (two) times  daily before a meal.   Yes [provider]  hydrochlorothiazide (HYDRODIURIL) 25 MG tablet Take 25 mg by mouth daily.   Yes [provider]  insulin detemir (LEVEMIR) 100 UNIT/ML injection Inject 21 Units into the skin at bedtime.   Yes [provider]  levothyroxine (SYNTHROID, LEVOTHROID) 75 MCG tablet Take 88 mcg by mouth daily before breakfast.    Yes [provider]  lisinopril (PRINIVIL,ZESTRIL) 10 MG tablet Take 10 mg by mouth daily.   Yes [provider]  magnesium oxide (MAG-OX) 400 MG tablet Take 400 mg by mouth daily.   Yes [provider]  metFORMIN (GLUCOPHAGE) 1000 MG tablet Take 1,000 mg by mouth 2 (two) times daily with a meal.   Yes [provider]  metoprolol succinate (TOPROL-XL) 25 MG 24 hr tablet Take 25 mg by mouth 2 (two) times daily.   Yes [provider]  omeprazole (PRILOSEC) 40 MG capsule Take 40 mg by mouth daily.   Yes [provider]  rosuvastatin (CRESTOR) 5 MG tablet Take 5 mg by mouth daily.   Yes [provider]  tadalafil (CIALIS) 5 MG tablet Take 5 mg by mouth daily as needed for erectile dysfunction.   Yes [provider]  valACYclovir (VALTREX) 500 MG tablet Take 500 mg by mouth 2 (two) times  daily.   Yes [provider]  hydrocortisone-pramoxine [facility]) 2.5-1 % rectal cream Place 1 application rectally 3 (three) times daily. 10/02/17   Coral Spikes, DO  polyethylene glycol powder (GLYCOLAX/MIRALAX) powder 17 g daily as needed for constipation. 10/02/17   Coral Spikes, DO   Family History Family History  Problem Relation Age of Onset  . Hypertension Mother   . Heart attack Mother   . Osteoarthritis Mother   . Stroke Father   . Hypertension Father     Social History Social History   Tobacco Use  . Smoking status: Former Research scientist (life sciences)  . Smokeless tobacco: Never Used  Substance Use Topics  . Alcohol use: Yes    Comment: rare  . Drug use: No    Allergies   Niacin and related and Statins   Review of Systems Review of Systems  Constitutional: Negative.   Gastrointestinal: Positive for abdominal pain and rectal pain. Negative for blood in stool.   Physical Exam Triage Vital Signs ED Triage Vitals  Enc Vitals Group     BP 10/02/17 1530 (!) 105/59     Pulse Rate 10/02/17 1530 69     Resp 10/02/17 1530 16     Temp 10/02/17 1530 97.8 F (36.6 C)     Temp Source 10/02/17 1530 Oral     SpO2 10/02/17 1530 98 %     Weight 10/02/17 1530 190 lb (86.2 kg)     Height 10/02/17 1530 5' 6.75" (1.695 m)     Head Circumference --      Peak Flow --      Pain Score 10/02/17 1531 6     Pain Loc --      Pain Edu? --      Excl. in [place]? --    Updated Vital Signs BP (!) 105/59 (BP Location: Right Arm)   Pulse 69   Temp 97.8 F (36.6 C) (Oral)   Resp 16   Ht 5' 6.75" (1.695 m)   Wt 190 lb (86.2 kg)   SpO2 98%   BMI 29.98 kg/m     Physical Exam  Constitutional: He is oriented to person, place, and time. He appears well-developed. No distress.  HENT:  Head: Normocephalic and atraumatic.  Cardiovascular: Normal rate and regular rhythm.  Pulmonary/Chest: Effort normal and breath sounds normal. He has no wheezes. He has no rales.  Abdominal: Soft. He exhibits no distension. There is no tenderness.  Genitourinary:  Genitourinary Comments: Rectal exam - erythema noted.  Skin tags from prior hemorrhoids noted.  No apparent fissures.  Tender to palpation  Neurological: He is alert and oriented to person, place, and time.  Psychiatric: He has a normal mood and affect. His behavior is normal.  Nursing note and vitals reviewed.  UC Treatments / Results  Labs (all labs ordered are listed, but only abnormal results are displayed) Labs Reviewed - No data to display  EKG  EKG Interpretation None       Radiology No results found.  Procedures Procedures (including critical care time)  Medications Ordered in UC Medications - No  data to display   Initial Impression / Assessment and Plan / UC Course  I have reviewed the triage vital signs and the nursing notes.  Pertinent labs & imaging results that were available during my care of the patient were reviewed by me and considered in my medical decision making (see chart for details).     71 year old male presents with rectal pain.  Treating with hydrocortisone/pramoxine and Miralax.  Final Clinical Impressions(s) / UC Diagnoses   Final diagnoses:  Rectal pain    ED Discharge Orders        Ordered    hydrocortisone-pramoxine [facility]) 2.5-1 % rectal cream  3 times daily     10/02/17 1550    polyethylene glycol powder (GLYCOLAX/MIRALAX) powder     10/02/17 1558     Controlled Substance Prescriptions [place] Controlled Substance Registry consulted? Not Applicable   Coral Spikes, DO 10/02/17 1619

## 2017-10-02 NOTE — ED Triage Notes (Signed)
Pt reports two weeks of issues with bowels. Constipated x 5 days and last night he had a bowel movement last night that was hard, long and large in diameter. Patient having rectal pain.

## 2017-10-13 DIAGNOSIS — I440 Atrioventricular block, first degree: Secondary | ICD-10-CM | POA: Diagnosis not present

## 2017-10-13 DIAGNOSIS — I10 Essential (primary) hypertension: Secondary | ICD-10-CM | POA: Diagnosis not present

## 2017-10-13 DIAGNOSIS — I459 Conduction disorder, unspecified: Secondary | ICD-10-CM | POA: Diagnosis not present

## 2017-10-13 DIAGNOSIS — E782 Mixed hyperlipidemia: Secondary | ICD-10-CM | POA: Diagnosis not present

## 2017-10-13 DIAGNOSIS — I2510 Atherosclerotic heart disease of native coronary artery without angina pectoris: Secondary | ICD-10-CM | POA: Diagnosis not present

## 2017-10-15 DIAGNOSIS — N489 Disorder of penis, unspecified: Secondary | ICD-10-CM | POA: Insufficient documentation

## 2017-10-22 DIAGNOSIS — M65342 Trigger finger, left ring finger: Secondary | ICD-10-CM | POA: Diagnosis not present

## 2017-10-22 DIAGNOSIS — M79642 Pain in left hand: Secondary | ICD-10-CM | POA: Diagnosis not present

## 2017-10-22 DIAGNOSIS — M25542 Pain in joints of left hand: Secondary | ICD-10-CM | POA: Diagnosis not present

## 2017-10-22 DIAGNOSIS — M65332 Trigger finger, left middle finger: Secondary | ICD-10-CM | POA: Diagnosis not present

## 2017-12-10 DIAGNOSIS — Z8612 Personal history of poliomyelitis: Secondary | ICD-10-CM | POA: Insufficient documentation

## 2017-12-10 DIAGNOSIS — E785 Hyperlipidemia, unspecified: Secondary | ICD-10-CM | POA: Diagnosis not present

## 2017-12-10 DIAGNOSIS — E119 Type 2 diabetes mellitus without complications: Secondary | ICD-10-CM | POA: Diagnosis not present

## 2017-12-10 DIAGNOSIS — E039 Hypothyroidism, unspecified: Secondary | ICD-10-CM | POA: Diagnosis not present

## 2017-12-10 DIAGNOSIS — I10 Essential (primary) hypertension: Secondary | ICD-10-CM | POA: Diagnosis not present

## 2018-02-16 DIAGNOSIS — L720 Epidermal cyst: Secondary | ICD-10-CM | POA: Diagnosis not present

## 2018-02-16 DIAGNOSIS — L821 Other seborrheic keratosis: Secondary | ICD-10-CM | POA: Diagnosis not present

## 2018-02-16 DIAGNOSIS — L570 Actinic keratosis: Secondary | ICD-10-CM | POA: Diagnosis not present

## 2018-02-16 DIAGNOSIS — L578 Other skin changes due to chronic exposure to nonionizing radiation: Secondary | ICD-10-CM | POA: Diagnosis not present

## 2018-02-17 DIAGNOSIS — F4321 Adjustment disorder with depressed mood: Secondary | ICD-10-CM | POA: Diagnosis not present

## 2018-03-11 DIAGNOSIS — Z125 Encounter for screening for malignant neoplasm of prostate: Secondary | ICD-10-CM | POA: Diagnosis not present

## 2018-04-01 DIAGNOSIS — I10 Essential (primary) hypertension: Secondary | ICD-10-CM | POA: Diagnosis not present

## 2018-04-01 DIAGNOSIS — D631 Anemia in chronic kidney disease: Secondary | ICD-10-CM | POA: Diagnosis not present

## 2018-04-01 DIAGNOSIS — N183 Chronic kidney disease, stage 3 (moderate): Secondary | ICD-10-CM | POA: Diagnosis not present

## 2018-04-01 DIAGNOSIS — N2581 Secondary hyperparathyroidism of renal origin: Secondary | ICD-10-CM | POA: Diagnosis not present

## 2018-04-03 DIAGNOSIS — N183 Chronic kidney disease, stage 3 (moderate): Secondary | ICD-10-CM | POA: Diagnosis not present

## 2018-04-03 DIAGNOSIS — I10 Essential (primary) hypertension: Secondary | ICD-10-CM | POA: Diagnosis not present

## 2018-04-03 DIAGNOSIS — N2581 Secondary hyperparathyroidism of renal origin: Secondary | ICD-10-CM | POA: Diagnosis not present

## 2018-04-03 DIAGNOSIS — D631 Anemia in chronic kidney disease: Secondary | ICD-10-CM | POA: Diagnosis not present

## 2018-04-08 DIAGNOSIS — M25562 Pain in left knee: Secondary | ICD-10-CM | POA: Diagnosis not present

## 2018-04-08 DIAGNOSIS — M25561 Pain in right knee: Secondary | ICD-10-CM | POA: Diagnosis not present

## 2018-04-13 DIAGNOSIS — M533 Sacrococcygeal disorders, not elsewhere classified: Secondary | ICD-10-CM | POA: Diagnosis not present

## 2018-04-17 ENCOUNTER — Other Ambulatory Visit: Payer: Self-pay

## 2018-04-17 ENCOUNTER — Ambulatory Visit
Admission: EM | Admit: 2018-04-17 | Discharge: 2018-04-17 | Disposition: A | Discharge status: Home / Self Care | Payer: Medicare Other | Attending: Family Medicine | Admitting: Family Medicine

## 2018-04-17 ENCOUNTER — Encounter: Payer: Self-pay | Admitting: Emergency Medicine

## 2018-04-17 DIAGNOSIS — I950 Idiopathic hypotension: Secondary | ICD-10-CM | POA: Diagnosis not present

## 2018-04-17 DIAGNOSIS — W57XXXA Bitten or stung by nonvenomous insect and other nonvenomous arthropods, initial encounter: Secondary | ICD-10-CM | POA: Insufficient documentation

## 2018-04-17 DIAGNOSIS — R5383 Other fatigue: Secondary | ICD-10-CM

## 2018-04-17 DIAGNOSIS — T148XXA Other injury of unspecified body region, initial encounter: Secondary | ICD-10-CM | POA: Insufficient documentation

## 2018-04-17 DIAGNOSIS — T07XXXA Unspecified multiple injuries, initial encounter: Secondary | ICD-10-CM

## 2018-04-17 LAB — CBC WITH DIFFERENTIAL/PLATELET
Basophils Absolute: 0 10*3/uL (ref 0–0.1)
Basophils Relative: 1 %
Eosinophils Absolute: 0.2 10*3/uL (ref 0–0.7)
Eosinophils Relative: 5 %
HEMATOCRIT: 39.2 % — AB (ref 40.0–52.0)
HEMOGLOBIN: 12.8 g/dL — AB (ref 13.0–18.0)
LYMPHS ABS: 0.8 10*3/uL — AB (ref 1.0–3.6)
LYMPHS PCT: 25 %
MCH: 27.8 pg (ref 26.0–34.0)
MCHC: 32.5 g/dL (ref 32.0–36.0)
MCV: 85.5 fL (ref 80.0–100.0)
Monocytes Absolute: 0.4 10*3/uL (ref 0.2–1.0)
Monocytes Relative: 13 %
NEUTROS PCT: 56 %
Neutro Abs: 1.8 10*3/uL (ref 1.4–6.5)
Platelets: 182 10*3/uL (ref 150–440)
RBC: 4.59 MIL/uL (ref 4.40–5.90)
RDW: 16.3 % — ABNORMAL HIGH (ref 11.5–14.5)
WBC: 3.2 10*3/uL — ABNORMAL LOW (ref 3.8–10.6)

## 2018-04-17 LAB — COMPREHENSIVE METABOLIC PANEL
ALT: 20 U/L (ref 0–44)
AST: 27 U/L (ref 15–41)
Albumin: 3.7 g/dL (ref 3.5–5.0)
Alkaline Phosphatase: 51 U/L (ref 38–126)
Anion gap: 11 (ref 5–15)
BUN: 23 mg/dL (ref 8–23)
CHLORIDE: 102 mmol/L (ref 98–111)
CO2: 23 mmol/L (ref 22–32)
Calcium: 8.8 mg/dL — ABNORMAL LOW (ref 8.9–10.3)
Creatinine, Ser: 1.27 mg/dL — ABNORMAL HIGH (ref 0.61–1.24)
GFR, EST NON AFRICAN AMERICAN: 54 mL/min — AB (ref >60)
Glucose, Bld: 233 mg/dL — ABNORMAL HIGH (ref 70–99)
POTASSIUM: 4.3 mmol/L (ref 3.5–5.1)
Sodium: 136 mmol/L (ref 135–145)
Total Bilirubin: 0.6 mg/dL (ref 0.3–1.2)
Total Protein: 6.9 g/dL (ref 6.5–8.1)

## 2018-04-17 MED ORDER — DOXYCYCLINE HYCLATE 100 MG PO TABS: 100.0000 mg | ORAL_TABLET | Freq: Two times a day (BID) | ORAL | 0 refills | Status: DC

## 2018-04-17 NOTE — ED Triage Notes (Signed)
Pt here today for multiple tick bite. He had them over the last couple of weeks. He was hiking and looking for a lost dog. He woke up with what felt like a fever. He had chills and then joint aches this morning when he got up. He is unusually tired. He told his nephrologist about the tick bites and he told him he needed to be tested for lymes disease. BP low today. Pt reports that his BP was low when he was at [facility]. Denies any dizziness, lightheadedness.

## 2018-04-17 NOTE — ED Provider Notes (Signed)
[facility]    CSN: [id number] Arrival date & time: 04/17/18  1350     History   Chief Complaint Chief Complaint  Patient presents with  . Insect Bite  . Fatigue    HPI Maurice Fotheringham is a 72 y.o. male.   72 yo male with a c/o fatigue for the past 2 weeks. States that otherwise he feels "fine". Denies any fevers, chest pains, shortness of breath, vomiting, diarrhea. States several weeks ago he had multiple tick bites when he was hiking, however he removed all ticks, none of which was embedded for more than 24 hours. States last week when he was seen at [facility] his blood pressure was low. Denies any dizziness, lightheadedness, fainting.   The history is provided by the patient.    Past Medical History:  Diagnosis Date  . Arthritis   . Coronary artery disease   . Diabetes mellitus without complication ([place])   . GERD (gastroesophageal reflux disease)   . Hypertension   . IBS (irritable bowel syndrome)   . Thyroid disease     There are no active problems to display for this patient.   Past Surgical History:  Procedure Laterality Date  . BACK SURGERY    . HERNIA REPAIR    . HYDROCELE EXCISION / REPAIR    . JOINT REPLACEMENT    . TONSILLECTOMY         Home Medications    Prior to Admission medications   Medication Sig Start Date End Date Taking? Authorizing Provider  alfuzosin (UROXATRAL) 10 MG 24 hr tablet Take 10 mg by mouth daily with breakfast.   Yes [provider]  aspirin EC 325 MG tablet Take 325 mg by mouth daily.   Yes [provider]  ezetimibe (ZETIA) 10 MG tablet Take 10 mg by mouth daily.   Yes [provider]  glipiZIDE (GLUCOTROL) 10 MG tablet Take 10 mg by mouth 2 (two) times daily before a meal.   Yes [provider]  hydrochlorothiazide (HYDRODIURIL) 25 MG tablet Take 25 mg by mouth daily.   Yes [provider]  hydrocortisone-pramoxine [facility]) 2.5-1 % rectal cream Place 1  application rectally 3 (three) times daily. 10/02/17  Yes Cook, Jayce G, DO  insulin detemir (LEVEMIR) 100 UNIT/ML injection Inject 21 Units into the skin at bedtime.   Yes [provider]  levothyroxine (SYNTHROID, LEVOTHROID) 75 MCG tablet Take 88 mcg by mouth daily before breakfast.    Yes [provider]  lisinopril (PRINIVIL,ZESTRIL) 10 MG tablet Take 10 mg by mouth daily.   Yes [provider]  magnesium oxide (MAG-OX) 400 MG tablet Take 400 mg by mouth daily.   Yes [provider]  metFORMIN (GLUCOPHAGE) 1000 MG tablet Take 1,000 mg by mouth 2 (two) times daily with a meal.   Yes [provider]  metoprolol succinate (TOPROL-XL) 25 MG 24 hr tablet Take 25 mg by mouth 2 (two) times daily.   Yes [provider]  omeprazole (PRILOSEC) 40 MG capsule Take 40 mg by mouth daily.   Yes [provider]  polyethylene glycol powder (GLYCOLAX/MIRALAX) powder 17 g daily as needed for constipation. 10/02/17  Yes Cook, Jayce G, DO  rosuvastatin (CRESTOR) 5 MG tablet Take 5 mg by mouth daily.   Yes [provider]  tadalafil (CIALIS) 5 MG tablet Take 5 mg by mouth daily as needed for erectile dysfunction.   Yes [provider]  valACYclovir (VALTREX) 500 MG tablet  Take 500 mg by mouth 2 (two) times daily.   Yes [provider]  doxycycline (VIBRA-TABS) 100 MG tablet Take 1 tablet (100 mg total) by mouth 2 (two) times daily. 04/17/18   Norval Gable, MD    Family History Family History  Problem Relation Age of Onset  . Hypertension Mother   . Heart attack Mother   . Osteoarthritis Mother   . Stroke Father   . Hypertension Father     Social History Social History   Tobacco Use  . Smoking status: Former Research scientist (life sciences)  . Smokeless tobacco: Never Used  Substance Use Topics  . Alcohol use: Yes    Comment: rare  . Drug use: No     Allergies   Niacin and related and Statins   Review of Systems Review of  Systems   Physical Exam Triage Vital Signs ED Triage Vitals  Enc Vitals Group     BP 04/17/18 1401 (!) 86/56     Pulse Rate 04/17/18 1401 70     Resp 04/17/18 1401 14     Temp 04/17/18 1401 98 F (36.7 C)     Temp Source 04/17/18 1401 Oral     SpO2 04/17/18 1401 99 %     Weight 04/17/18 1405 191 lb (86.6 kg)     Height 04/17/18 1405 5\' 6"  (1.676 m)     Head Circumference --      Peak Flow --      Pain Score 04/17/18 1405 5     Pain Loc --      Pain Edu? --      Excl. in [place]? --    No data found.  Updated Vital Signs BP (!) 86/56 (BP Location: Left Arm)   Pulse 70   Temp 98 F (36.7 C) (Oral)   Resp 14   Ht 5\' 6"  (1.676 m)   Wt 191 lb (86.6 kg)   SpO2 99%   BMI 30.83 kg/m   Visual Acuity Right Eye Distance:   Left Eye Distance:   Bilateral Distance:    Right Eye Near:   Left Eye Near:    Bilateral Near:     Physical Exam  Constitutional: He appears well-developed and well-nourished. No distress.  HENT:  Head: Normocephalic and atraumatic.  Mouth/Throat: Uvula is midline and mucous membranes are normal. No tonsillar abscesses.  Neck: Normal range of motion. Neck supple. No tracheal deviation present. No thyromegaly present.  Cardiovascular: Normal rate, regular rhythm and normal heart sounds.  Pulmonary/Chest: Effort normal and breath sounds normal. No stridor. No respiratory distress. He has no wheezes. He has no rales. He exhibits no tenderness.  Musculoskeletal: He exhibits no edema.  Lymphadenopathy:    He has no cervical adenopathy.  Neurological: He is alert.  Skin: Skin is warm and dry. No rash noted. He is not diaphoretic.  Nursing note and vitals reviewed.    UC Treatments / Results  Labs (all labs ordered are listed, but only abnormal results are displayed) Labs Reviewed  COMPREHENSIVE METABOLIC PANEL - Abnormal; Notable for the following components:      Result Value   Glucose, Bld 233 (*)    Creatinine, Ser 1.27 (*)    Calcium 8.8 (*)      GFR calc non Af Amer 54 (*)    All other components within normal limits  CBC WITH DIFFERENTIAL/PLATELET - Abnormal; Notable for the following components:   WBC 3.2 (*)    Hemoglobin 12.8 (*)  HCT 39.2 (*)    RDW 16.3 (*)    Lymphs Abs 0.8 (*)    All other components within normal limits  ROCKY MTN SPOTTED FVR ABS PNL(IGG+IGM)  B. BURGDORFI ANTIBODIES    EKG None  Radiology No results found.  Procedures Procedures (including critical care time)  Medications Ordered in UC Medications - No data to display  Initial Impression / Assessment and Plan / UC Course  I have reviewed the triage vital signs and the nursing notes.  Pertinent labs & imaging results that were available during my care of the patient were reviewed by me and considered in my medical decision making (see chart for details).      Final Clinical Impressions(s) / UC Diagnoses   Final diagnoses:  Tick bite, initial encounter  Idiopathic hypotension  Fatigue, unspecified type     Discharge Instructions     Stop hydrochlorothiazide for now Follow up with Primary care provider on Monday    ED Prescriptions    Medication Sig Dispense Auth. Provider   doxycycline (VIBRA-TABS) 100 MG tablet Take 1 tablet (100 mg total) by mouth 2 (two) times daily. 20 tablet Norval Gable, MD      1. Lab results and diagnosis reviewed with patient 2. rx as per orders above; reviewed possible side effects, interactions, risks and benefits  3. Recommend supportive treatment as above; increase fluids; hold HCTZ 4. Follow-up with PCP on Monday; go to Emergency Department over the weekend if symptoms worsen   Controlled Substance Prescriptions [place] Controlled Substance Registry consulted? Not Applicable   Norval Gable, MD 04/17/18 1859

## 2018-04-17 NOTE — Discharge Instructions (Signed)
Stop hydrochlorothiazide for now Follow up with Primary care provider on Monday

## 2018-04-18 LAB — B. BURGDORFI ANTIBODIES: B burgdorferi Ab IgG+IgM: <0.91 {ISR} (ref 0.00–0.90)

## 2018-04-21 LAB — ROCKY MTN SPOTTED FVR ABS PNL(IGG+IGM)
RMSF IGG: NEGATIVE
RMSF IgM: 1.78 index — ABNORMAL HIGH (ref 0.00–0.89)

## 2018-04-22 DIAGNOSIS — M533 Sacrococcygeal disorders, not elsewhere classified: Secondary | ICD-10-CM | POA: Diagnosis not present

## 2018-04-26 ENCOUNTER — Telehealth (HOSPITAL_COMMUNITY): Payer: Self-pay

## 2018-04-26 NOTE — Telephone Encounter (Signed)
RMSF positive pt was treated with Doxycyline per Dr. Meda Coffee. Pt called and made aware. Reports that he hasn't started his medication yet but he will today.

## 2018-05-05 DIAGNOSIS — I2510 Atherosclerotic heart disease of native coronary artery without angina pectoris: Secondary | ICD-10-CM | POA: Diagnosis not present

## 2018-05-05 DIAGNOSIS — I10 Essential (primary) hypertension: Secondary | ICD-10-CM | POA: Diagnosis not present

## 2018-05-05 DIAGNOSIS — Z96651 Presence of right artificial knee joint: Secondary | ICD-10-CM | POA: Diagnosis not present

## 2018-05-05 DIAGNOSIS — G588 Other specified mononeuropathies: Secondary | ICD-10-CM | POA: Diagnosis not present

## 2018-05-05 DIAGNOSIS — M25561 Pain in right knee: Secondary | ICD-10-CM | POA: Diagnosis not present

## 2018-05-05 DIAGNOSIS — M25562 Pain in left knee: Secondary | ICD-10-CM | POA: Diagnosis not present

## 2018-05-05 DIAGNOSIS — G8929 Other chronic pain: Secondary | ICD-10-CM | POA: Diagnosis not present

## 2018-05-05 DIAGNOSIS — M25511 Pain in right shoulder: Secondary | ICD-10-CM | POA: Diagnosis not present

## 2018-05-05 DIAGNOSIS — D508 Other iron deficiency anemias: Secondary | ICD-10-CM | POA: Diagnosis not present

## 2018-05-05 DIAGNOSIS — M25512 Pain in left shoulder: Secondary | ICD-10-CM | POA: Diagnosis not present

## 2018-05-05 DIAGNOSIS — E1100 Type 2 diabetes mellitus with hyperosmolarity without nonketotic hyperglycemic-hyperosmolar coma (NKHHC): Secondary | ICD-10-CM | POA: Diagnosis not present

## 2018-05-05 DIAGNOSIS — Z96652 Presence of left artificial knee joint: Secondary | ICD-10-CM | POA: Diagnosis not present

## 2018-05-12 DIAGNOSIS — F4321 Adjustment disorder with depressed mood: Secondary | ICD-10-CM | POA: Diagnosis not present

## 2018-05-14 ENCOUNTER — Ambulatory Visit
Admission: EM | Admit: 2018-05-14 | Discharge: 2018-05-14 | Disposition: A | Discharge status: Home / Self Care | Payer: Medicare Other | Attending: Family Medicine | Admitting: Family Medicine

## 2018-05-14 DIAGNOSIS — W57XXXD Bitten or stung by nonvenomous insect and other nonvenomous arthropods, subsequent encounter: Secondary | ICD-10-CM

## 2018-05-14 DIAGNOSIS — Z5321 Procedure and treatment not carried out due to patient leaving prior to being seen by health care provider: Secondary | ICD-10-CM

## 2018-05-14 DIAGNOSIS — A770 Spotted fever due to Rickettsia rickettsii: Secondary | ICD-10-CM | POA: Insufficient documentation

## 2018-05-14 DIAGNOSIS — Z09 Encounter for follow-up examination after completed treatment for conditions other than malignant neoplasm: Secondary | ICD-10-CM | POA: Insufficient documentation

## 2018-05-14 NOTE — ED Provider Notes (Signed)
Informed nursing that he wanted follow up RMSF testing. Advised nursing staff that this is not necessary. Patient was treated with doxy. Patient did not want to be seen by a provider.  [facility]    Coral Spikes, [place] 05/14/18 1727

## 2018-06-01 DIAGNOSIS — M19012 Primary osteoarthritis, left shoulder: Secondary | ICD-10-CM | POA: Diagnosis not present

## 2018-06-02 DIAGNOSIS — F4321 Adjustment disorder with depressed mood: Secondary | ICD-10-CM | POA: Diagnosis not present

## 2018-06-04 DIAGNOSIS — R233 Spontaneous ecchymoses: Secondary | ICD-10-CM | POA: Diagnosis not present

## 2018-06-04 DIAGNOSIS — L918 Other hypertrophic disorders of the skin: Secondary | ICD-10-CM | POA: Diagnosis not present

## 2018-06-04 DIAGNOSIS — S80862A Insect bite (nonvenomous), left lower leg, initial encounter: Secondary | ICD-10-CM | POA: Diagnosis not present

## 2018-06-04 DIAGNOSIS — L720 Epidermal cyst: Secondary | ICD-10-CM | POA: Diagnosis not present

## 2018-06-04 DIAGNOSIS — L821 Other seborrheic keratosis: Secondary | ICD-10-CM | POA: Diagnosis not present

## 2018-06-04 DIAGNOSIS — L570 Actinic keratosis: Secondary | ICD-10-CM | POA: Diagnosis not present

## 2018-06-04 DIAGNOSIS — X32XXXA Exposure to sunlight, initial encounter: Secondary | ICD-10-CM | POA: Diagnosis not present

## 2018-06-04 DIAGNOSIS — S80861A Insect bite (nonvenomous), right lower leg, initial encounter: Secondary | ICD-10-CM | POA: Diagnosis not present

## 2018-06-04 DIAGNOSIS — L814 Other melanin hyperpigmentation: Secondary | ICD-10-CM | POA: Diagnosis not present

## 2018-06-04 DIAGNOSIS — D2372 Other benign neoplasm of skin of left lower limb, including hip: Secondary | ICD-10-CM | POA: Diagnosis not present

## 2018-06-04 DIAGNOSIS — D485 Neoplasm of uncertain behavior of skin: Secondary | ICD-10-CM | POA: Diagnosis not present

## 2018-06-04 DIAGNOSIS — L218 Other seborrheic dermatitis: Secondary | ICD-10-CM | POA: Diagnosis not present

## 2018-06-12 DIAGNOSIS — M25512 Pain in left shoulder: Secondary | ICD-10-CM | POA: Diagnosis not present

## 2018-06-12 DIAGNOSIS — G8929 Other chronic pain: Secondary | ICD-10-CM | POA: Diagnosis not present

## 2018-06-16 DIAGNOSIS — F4321 Adjustment disorder with depressed mood: Secondary | ICD-10-CM | POA: Diagnosis not present

## 2018-06-30 DIAGNOSIS — Z794 Long term (current) use of insulin: Secondary | ICD-10-CM | POA: Diagnosis not present

## 2018-06-30 DIAGNOSIS — Z79899 Other long term (current) drug therapy: Secondary | ICD-10-CM | POA: Diagnosis not present

## 2018-06-30 DIAGNOSIS — Z951 Presence of aortocoronary bypass graft: Secondary | ICD-10-CM | POA: Diagnosis not present

## 2018-06-30 DIAGNOSIS — E039 Hypothyroidism, unspecified: Secondary | ICD-10-CM | POA: Diagnosis not present

## 2018-06-30 DIAGNOSIS — Z6831 Body mass index (BMI) 31.0-31.9, adult: Secondary | ICD-10-CM | POA: Diagnosis not present

## 2018-06-30 DIAGNOSIS — Z833 Family history of diabetes mellitus: Secondary | ICD-10-CM | POA: Diagnosis not present

## 2018-06-30 DIAGNOSIS — F4321 Adjustment disorder with depressed mood: Secondary | ICD-10-CM | POA: Diagnosis not present

## 2018-06-30 DIAGNOSIS — I10 Essential (primary) hypertension: Secondary | ICD-10-CM | POA: Diagnosis not present

## 2018-06-30 DIAGNOSIS — E1165 Type 2 diabetes mellitus with hyperglycemia: Secondary | ICD-10-CM | POA: Diagnosis not present

## 2018-06-30 DIAGNOSIS — E119 Type 2 diabetes mellitus without complications: Secondary | ICD-10-CM | POA: Diagnosis not present

## 2018-06-30 DIAGNOSIS — Z8249 Family history of ischemic heart disease and other diseases of the circulatory system: Secondary | ICD-10-CM | POA: Diagnosis not present

## 2018-06-30 DIAGNOSIS — Z87891 Personal history of nicotine dependence: Secondary | ICD-10-CM | POA: Diagnosis not present

## 2018-06-30 DIAGNOSIS — E785 Hyperlipidemia, unspecified: Secondary | ICD-10-CM | POA: Diagnosis not present

## 2018-06-30 DIAGNOSIS — I252 Old myocardial infarction: Secondary | ICD-10-CM | POA: Diagnosis not present

## 2018-07-08 DIAGNOSIS — R252 Cramp and spasm: Secondary | ICD-10-CM | POA: Diagnosis not present

## 2018-07-08 DIAGNOSIS — R61 Generalized hyperhidrosis: Secondary | ICD-10-CM | POA: Diagnosis not present

## 2018-07-08 DIAGNOSIS — R8290 Unspecified abnormal findings in urine: Secondary | ICD-10-CM | POA: Diagnosis not present

## 2018-07-08 DIAGNOSIS — Z23 Encounter for immunization: Secondary | ICD-10-CM | POA: Diagnosis not present

## 2018-07-09 DIAGNOSIS — D485 Neoplasm of uncertain behavior of skin: Secondary | ICD-10-CM | POA: Diagnosis not present

## 2018-07-09 DIAGNOSIS — L281 Prurigo nodularis: Secondary | ICD-10-CM | POA: Diagnosis not present

## 2018-07-09 DIAGNOSIS — M25512 Pain in left shoulder: Secondary | ICD-10-CM | POA: Diagnosis not present

## 2018-07-09 DIAGNOSIS — G8929 Other chronic pain: Secondary | ICD-10-CM | POA: Diagnosis not present

## 2018-07-13 DIAGNOSIS — M791 Myalgia, unspecified site: Secondary | ICD-10-CM | POA: Diagnosis not present

## 2018-07-13 DIAGNOSIS — T466X5A Adverse effect of antihyperlipidemic and antiarteriosclerotic drugs, initial encounter: Secondary | ICD-10-CM | POA: Diagnosis not present

## 2018-07-13 DIAGNOSIS — I10 Essential (primary) hypertension: Secondary | ICD-10-CM | POA: Diagnosis not present

## 2018-07-13 DIAGNOSIS — E782 Mixed hyperlipidemia: Secondary | ICD-10-CM | POA: Diagnosis not present

## 2018-07-13 DIAGNOSIS — I952 Hypotension due to drugs: Secondary | ICD-10-CM | POA: Diagnosis not present

## 2018-07-13 DIAGNOSIS — I2510 Atherosclerotic heart disease of native coronary artery without angina pectoris: Secondary | ICD-10-CM | POA: Diagnosis not present

## 2018-07-16 ENCOUNTER — Other Ambulatory Visit: Payer: Self-pay | Admitting: Nephrology

## 2018-07-16 DIAGNOSIS — E1122 Type 2 diabetes mellitus with diabetic chronic kidney disease: Secondary | ICD-10-CM | POA: Diagnosis not present

## 2018-07-16 DIAGNOSIS — N179 Acute kidney failure, unspecified: Secondary | ICD-10-CM

## 2018-07-16 DIAGNOSIS — N2581 Secondary hyperparathyroidism of renal origin: Secondary | ICD-10-CM | POA: Diagnosis not present

## 2018-07-16 DIAGNOSIS — D631 Anemia in chronic kidney disease: Secondary | ICD-10-CM | POA: Diagnosis not present

## 2018-07-16 DIAGNOSIS — N183 Chronic kidney disease, stage 3 (moderate): Secondary | ICD-10-CM | POA: Diagnosis not present

## 2018-07-16 DIAGNOSIS — I10 Essential (primary) hypertension: Secondary | ICD-10-CM | POA: Diagnosis not present

## 2018-07-17 ENCOUNTER — Other Ambulatory Visit: Payer: Self-pay | Admitting: Nephrology

## 2018-07-17 DIAGNOSIS — N179 Acute kidney failure, unspecified: Secondary | ICD-10-CM

## 2018-07-21 DIAGNOSIS — F4321 Adjustment disorder with depressed mood: Secondary | ICD-10-CM | POA: Diagnosis not present

## 2018-07-22 ENCOUNTER — Ambulatory Visit
Admission: RE | Admit: 2018-07-22 | Discharge: 2018-07-22 | Disposition: A | Discharge status: Home / Self Care | Payer: Medicare Other | Source: Ambulatory Visit | Attending: Nephrology | Admitting: Nephrology

## 2018-07-22 DIAGNOSIS — N178 Other acute kidney failure: Secondary | ICD-10-CM | POA: Insufficient documentation

## 2018-07-22 DIAGNOSIS — N183 Chronic kidney disease, stage 3 (moderate): Secondary | ICD-10-CM | POA: Insufficient documentation

## 2018-07-22 DIAGNOSIS — N179 Acute kidney failure, unspecified: Secondary | ICD-10-CM

## 2018-08-04 DIAGNOSIS — R350 Frequency of micturition: Secondary | ICD-10-CM | POA: Diagnosis not present

## 2018-08-04 DIAGNOSIS — N401 Enlarged prostate with lower urinary tract symptoms: Secondary | ICD-10-CM | POA: Diagnosis not present

## 2018-08-04 DIAGNOSIS — F4321 Adjustment disorder with depressed mood: Secondary | ICD-10-CM | POA: Diagnosis not present

## 2018-08-06 ENCOUNTER — Encounter: Payer: Self-pay | Admitting: Emergency Medicine

## 2018-08-06 ENCOUNTER — Ambulatory Visit
Admission: EM | Admit: 2018-08-06 | Discharge: 2018-08-06 | Disposition: A | Discharge status: Home / Self Care | Payer: Medicare Other | Attending: Family Medicine | Admitting: Family Medicine

## 2018-08-06 ENCOUNTER — Other Ambulatory Visit: Payer: Self-pay

## 2018-08-06 DIAGNOSIS — D649 Anemia, unspecified: Secondary | ICD-10-CM | POA: Insufficient documentation

## 2018-08-06 DIAGNOSIS — E119 Type 2 diabetes mellitus without complications: Secondary | ICD-10-CM | POA: Diagnosis not present

## 2018-08-06 DIAGNOSIS — N400 Enlarged prostate without lower urinary tract symptoms: Secondary | ICD-10-CM | POA: Insufficient documentation

## 2018-08-06 DIAGNOSIS — E785 Hyperlipidemia, unspecified: Secondary | ICD-10-CM | POA: Insufficient documentation

## 2018-08-06 DIAGNOSIS — M7989 Other specified soft tissue disorders: Secondary | ICD-10-CM | POA: Diagnosis not present

## 2018-08-06 DIAGNOSIS — I10 Essential (primary) hypertension: Secondary | ICD-10-CM | POA: Diagnosis not present

## 2018-08-06 DIAGNOSIS — Z87891 Personal history of nicotine dependence: Secondary | ICD-10-CM | POA: Diagnosis not present

## 2018-08-06 DIAGNOSIS — Z794 Long term (current) use of insulin: Secondary | ICD-10-CM | POA: Diagnosis not present

## 2018-08-06 DIAGNOSIS — Z8249 Family history of ischemic heart disease and other diseases of the circulatory system: Secondary | ICD-10-CM | POA: Insufficient documentation

## 2018-08-06 DIAGNOSIS — Z85828 Personal history of other malignant neoplasm of skin: Secondary | ICD-10-CM | POA: Insufficient documentation

## 2018-08-06 DIAGNOSIS — Z96653 Presence of artificial knee joint, bilateral: Secondary | ICD-10-CM | POA: Insufficient documentation

## 2018-08-06 DIAGNOSIS — N529 Male erectile dysfunction, unspecified: Secondary | ICD-10-CM | POA: Insufficient documentation

## 2018-08-06 DIAGNOSIS — M1990 Unspecified osteoarthritis, unspecified site: Secondary | ICD-10-CM | POA: Insufficient documentation

## 2018-08-06 DIAGNOSIS — Z7982 Long term (current) use of aspirin: Secondary | ICD-10-CM | POA: Insufficient documentation

## 2018-08-06 DIAGNOSIS — Z951 Presence of aortocoronary bypass graft: Secondary | ICD-10-CM | POA: Insufficient documentation

## 2018-08-06 DIAGNOSIS — Z79899 Other long term (current) drug therapy: Secondary | ICD-10-CM | POA: Insufficient documentation

## 2018-08-06 DIAGNOSIS — I2510 Atherosclerotic heart disease of native coronary artery without angina pectoris: Secondary | ICD-10-CM | POA: Insufficient documentation

## 2018-08-06 DIAGNOSIS — Z79890 Hormone replacement therapy (postmenopausal): Secondary | ICD-10-CM | POA: Insufficient documentation

## 2018-08-06 DIAGNOSIS — E039 Hypothyroidism, unspecified: Secondary | ICD-10-CM | POA: Diagnosis not present

## 2018-08-06 DIAGNOSIS — F329 Major depressive disorder, single episode, unspecified: Secondary | ICD-10-CM | POA: Diagnosis not present

## 2018-08-06 DIAGNOSIS — G5603 Carpal tunnel syndrome, bilateral upper limbs: Secondary | ICD-10-CM | POA: Insufficient documentation

## 2018-08-06 LAB — URINALYSIS, COMPLETE (UACMP) WITH MICROSCOPIC
BACTERIA UA: NONE SEEN
Bilirubin Urine: NEGATIVE
Glucose, UA: >1000 mg/dL — AB
Hgb urine dipstick: NEGATIVE
Ketones, ur: NEGATIVE mg/dL
LEUKOCYTES UA: NEGATIVE
Nitrite: NEGATIVE
PROTEIN: NEGATIVE mg/dL
RBC / HPF: NONE SEEN RBC/hpf (ref 0–5)
Specific Gravity, Urine: 1.01 (ref 1.005–1.030)
WBC, UA: NONE SEEN WBC/hpf (ref 0–5)
pH: 5 (ref 5.0–8.0)

## 2018-08-06 LAB — CBC WITH DIFFERENTIAL/PLATELET
Abs Immature Granulocytes: 0.04 10*3/uL (ref 0.00–0.07)
BASOS ABS: 0 10*3/uL (ref 0.0–0.1)
BASOS PCT: 1 %
Eosinophils Absolute: 0.4 10*3/uL (ref 0.0–0.5)
Eosinophils Relative: 7 %
HCT: 38.4 % — ABNORMAL LOW (ref 39.0–52.0)
Hemoglobin: 12.3 g/dL — ABNORMAL LOW (ref 13.0–17.0)
IMMATURE GRANULOCYTES: 1 %
Lymphocytes Relative: 27 %
Lymphs Abs: 1.5 10*3/uL (ref 0.7–4.0)
MCH: 28.2 pg (ref 26.0–34.0)
MCHC: 32 g/dL (ref 30.0–36.0)
MCV: 88.1 fL (ref 80.0–100.0)
Monocytes Absolute: 0.5 10*3/uL (ref 0.1–1.0)
Monocytes Relative: 9 %
NEUTROS PCT: 55 %
NRBC: 0 % (ref 0.0–0.2)
Neutro Abs: 3.2 10*3/uL (ref 1.7–7.7)
PLATELETS: 230 10*3/uL (ref 150–400)
RBC: 4.36 MIL/uL (ref 4.22–5.81)
RDW: 15.5 % (ref 11.5–15.5)
WBC: 5.6 10*3/uL (ref 4.0–10.5)

## 2018-08-06 LAB — COMPREHENSIVE METABOLIC PANEL
ALT: 22 U/L (ref 0–44)
ANION GAP: 11 (ref 5–15)
AST: 25 U/L (ref 15–41)
Albumin: 3.9 g/dL (ref 3.5–5.0)
Alkaline Phosphatase: 56 U/L (ref 38–126)
BUN: 21 mg/dL (ref 8–23)
CHLORIDE: 102 mmol/L (ref 98–111)
CO2: 25 mmol/L (ref 22–32)
Calcium: 9.3 mg/dL (ref 8.9–10.3)
Creatinine, Ser: 1.18 mg/dL (ref 0.61–1.24)
GFR calc Af Amer: >60 mL/min (ref >60)
GFR, EST NON AFRICAN AMERICAN: 59 mL/min — AB (ref >60)
Glucose, Bld: 221 mg/dL — ABNORMAL HIGH (ref 70–99)
POTASSIUM: 4.1 mmol/L (ref 3.5–5.1)
Sodium: 138 mmol/L (ref 135–145)
Total Bilirubin: 0.6 mg/dL (ref 0.3–1.2)
Total Protein: 7.2 g/dL (ref 6.5–8.1)

## 2018-08-06 LAB — BRAIN NATRIURETIC PEPTIDE: B NATRIURETIC PEPTIDE 5: 96 pg/mL (ref 0.0–100.0)

## 2018-08-06 NOTE — ED Provider Notes (Signed)
[facility]    CSN: [id number] Arrival date & time: 08/06/18  1832  History   Chief Complaint Chief Complaint  Patient presents with  . Leg Swelling   HPI   72 year old male with complicated past medical history presents with multiple complaints.  Patient reports that he has had a variety of symptoms over the past few weeks.  Patient reports headache, malodorous urine, dark urine stomachache, night sweats, joint pain, weight gain, halitosis, back pain.  Denies shortness of breath.  He states that over the past few days he has developed bilateral lower extremity swelling.  He has recently been taken off of his diuretic due to hypotension.  He has seen his primary care doctor as well as his cardiologist and nephrologist within the past month.  Additionally, he is seeing his urologist as well.  He has had a recent laboratory studies this month.  Elevated creatinine 1.4 on 10/2.  Diabetes is uncontrolled.  Recent A1c 8.7.  Mild anemia.  Normal thyroid studies.  Urinalysis at urology 2 days ago was normal.  Patient is very perplexed by his symptoms.  Patient states that he is unsure what is going on but something is wrong.  He states that his urologist mentioned Lyme disease.  He was tested earlier this year and was negative.  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  No known exacerbating or relieving factors.  PMH: Diabetes mellitus, type 2 (CMS-[name])    CAD (coronary artery disease)  s/p CABG 3 vessel (2008)  Hypertension    Hyperlipidemia    Low back pain    Arthritis    Lung nodules    Hypothyroid, unspecified    Carpal tunnel syndrome on both sides    ED (erectile dysfunction) 02/11/2012   BPH (benign prostatic hyperplasia) 02/11/2012   Anemia  hx iron deficiency, cannot tolerate iron supplements.  Peyronie's disease    DJD (degenerative joint disease)    Pancreatic insufficiency    Carpal tunnel syndrome     Anesthesia complication  throat and lip injury with intubation  Cancer (CMS-[name])  skin cancer  Encounter for blood transfusion    Depression    Heart disease    Difficult airway    Polio 1951 Affected his abdominal musculature; did not affect his breathing or extremities   Surgical Hx: CORONARY ARTERY BYPASS GRAFT 10/07/2006 - 10/07/2007  3 vessel    LAMINECTOMY 10/07/2004 - 10/06/2005  1.    Anterior lumbar interbody fusion via a perisacral retroperitoneal minimally invasive approach (AXLIF) L5-S1.    REPLACEMENT TOTAL KNEE BILATERAL 10/08/2007 - 10/06/2008  left and right   laminectomy and fusion 10/07/1965 - 10/06/1966     LAMINECTOMY LUMBAR SPINE 09/06/2005 - 10/06/2005     left hydrocele repair 07/23/2011     REPAIR INGUINAL HERNIA 07/20/2013 Groin/Right Procedure: PR RPR INGUINL HERNIA AGE5+ [place]; Surgeon: Maryjo Rochester, MD; Location: [facility]; Service: General Surgery; Laterality: Right;  Medical devices from this surgery are in the Implants section.   JOINT REPLACEMENT      TONSILLECTOMY      BACK SURGERY   x 2   INGUINAL HERNIA REPAIR 10/07/2009 - 10/06/2010 Left x 2   COLONOSCOPY W/REMOVAL LESIONS BY SNARE 06/29/2014 N/A Procedure: Colonoscopy; Surgeon: Johnnette Litter, MD; Location: [facility]; Service: Gastroenterology; Laterality: N/A;     Home Medications    Prior to Admission medications   Medication Sig Start Date End Date Taking? Authorizing  Provider  allopurinol (ZYLOPRIM) 100 MG tablet Take by mouth. 07/20/18  Yes [provider]  aspirin EC 325 MG tablet Take 325 mg by mouth daily.   Yes [provider]  B-12 MICROLOZENGE 500 MCG SUBL DISSOLVE 2 TABLETS UNDER THE TONGUE ONCE DAILY 07/14/18  Yes [provider]  ezetimibe (ZETIA) 10 MG tablet Take 10 mg by mouth daily.   Yes [provider]  glipiZIDE (GLUCOTROL) 10 MG tablet Take 10 mg by mouth 2 (two) times daily before a meal.    Yes [provider]  hydrochlorothiazide (HYDRODIURIL) 25 MG tablet Take 25 mg by mouth daily.   Yes [provider]  hydrocortisone-pramoxine [facility]) 2.5-1 % rectal cream Place 1 application rectally 3 (three) times daily. 10/02/17  Yes Shalena Ezzell G, DO  insulin detemir (LEVEMIR) 100 UNIT/ML injection Inject 21 Units into the skin at bedtime.   Yes [provider]  levothyroxine (SYNTHROID, LEVOTHROID) 75 MCG tablet Take 88 mcg by mouth daily before breakfast.    Yes [provider]  lisinopril (PRINIVIL,ZESTRIL) 10 MG tablet Take 10 mg by mouth daily.   Yes [provider]  magnesium oxide (MAG-OX) 400 MG tablet Take 400 mg by mouth daily.   Yes [provider]  metFORMIN (GLUCOPHAGE) 1000 MG tablet Take 1,000 mg by mouth 2 (two) times daily with a meal.   Yes [provider]  metoprolol succinate (TOPROL-XL) 25 MG 24 hr tablet Take 25 mg by mouth 2 (two) times daily.   Yes [provider]  mirabegron ER (MYRBETRIQ) 25 MG TB24 tablet Take 1 tablet by mouth daily. 08/04/18  Yes [provider]  omeprazole (PRILOSEC) 40 MG capsule Take 40 mg by mouth daily.   Yes [provider]  polyethylene glycol powder (GLYCOLAX/MIRALAX) powder 17 g daily as needed for constipation. 10/02/17  Yes Rudransh Bellanca G, DO  rosuvastatin (CRESTOR) 5 MG tablet Take 5 mg by mouth daily.   Yes [provider]  tadalafil (CIALIS) 5 MG tablet Take 5 mg by mouth daily as needed for erectile dysfunction.   Yes [provider]  valACYclovir (VALTREX) 500 MG tablet Take 500 mg by mouth 2 (two) times daily.   Yes [provider]  Vitamin D, Ergocalciferol, (DRISDOL) 50000 units CAPS capsule Take by mouth. 07/14/18 08/13/18 Yes [provider]    Family History Family History  Problem Relation Age of Onset  . Hypertension Mother   . Heart attack Mother   . Osteoarthritis Mother   . Stroke Father    . Hypertension Father     Social History Social History   Tobacco Use  . Smoking status: Former Smoker    Last attempt to quit: 12/1996    Years since quitting: 21.6  . Smokeless tobacco: Never Used  Substance Use Topics  . Alcohol use: Yes    Comment: rare  . Drug use: No     Allergies   Niacin and related and Statins   Review of Systems Review of Systems Per HPI  Physical Exam Triage Vital Signs ED Triage Vitals  Enc Vitals Group     BP 08/06/18 1842 117/65     Pulse Rate 08/06/18 1842 62     Resp 08/06/18 1842 16     Temp 08/06/18 1842 97.8 F (36.6 C)     Temp Source 08/06/18 1842 Oral     SpO2 08/06/18 1842 98 %     Weight 08/06/18 1843 198 lb (89.8 kg)  Height 08/06/18 1843 5' 6.75" (1.695 m)     Head Circumference --      Peak Flow --      Pain Score 08/06/18 1843 2     Pain Loc --      Pain Edu? --      Excl. in [place]? --    Updated Vital Signs BP 117/65 (BP Location: Left Arm)   Pulse 62   Temp 97.8 F (36.6 C) (Oral)   Resp 16   Ht 5' 6.75" (1.695 m)   Wt 89.8 kg   SpO2 98%   BMI 31.24 kg/m   Visual Acuity Right Eye Distance:   Left Eye Distance:   Bilateral Distance:    Right Eye Near:   Left Eye Near:    Bilateral Near:     Physical Exam  Constitutional: He is oriented to person, place, and time. He appears well-developed. No distress.  HENT:  Head: Normocephalic and atraumatic.  Eyes: Conjunctivae are normal. Right eye exhibits no discharge. Left eye exhibits no discharge.  Cardiovascular: Normal rate and regular rhythm.  2+ pitting lower extremity edema Bilaterally.  Pulmonary/Chest: Effort normal and breath sounds normal. He has no wheezes. He has no rales.  Abdominal: Soft. He exhibits no distension. There is no tenderness.  Neurological: He is alert and oriented to person, place, and time.  Psychiatric: He has a normal mood and affect. His behavior is normal.  Nursing note and vitals reviewed.  UC Treatments / Results    Labs (all labs ordered are listed, but only abnormal results are displayed) Labs Reviewed  URINALYSIS, COMPLETE (UACMP) WITH MICROSCOPIC - Abnormal; Notable for the following components:      Result Value   Glucose, UA >1000 (*)    All other components within normal limits  CBC WITH DIFFERENTIAL/PLATELET - Abnormal; Notable for the following components:   Hemoglobin 12.3 (*)    HCT 38.4 (*)    All other components within normal limits  COMPREHENSIVE METABOLIC PANEL - Abnormal; Notable for the following components:   Glucose, Bld 221 (*)    GFR calc non Af Amer 59 (*)    All other components within normal limits  BRAIN NATRIURETIC PEPTIDE    EKG None  Radiology No results found.  Procedures Procedures (including critical care time)  Medications Ordered in UC Medications - No data to display  Initial Impression / Assessment and Plan / UC Course  I have reviewed the triage vital signs and the nursing notes.  Pertinent labs & imaging results that were available during my care of the patient were reviewed by me and considered in my medical decision making (see chart for details).    72 year old male presents with multiple somatic complaints.  His exam is remarkable for lower extremity edema.  Otherwise negative.  I suspect that this is secondary to recent diuretic cessation due to hypertension.  I advised him to consider restarting at half a pill of HCTZ.  Patient to consider.  Awaiting BNP results.  Advised to follow-up with primary care physician as I am not sure what is causing his myriad of complaints.  Final Clinical Impressions(s) / UC Diagnoses   Final diagnoses:  Leg swelling     Discharge Instructions     Labs unrevealing. Awaiting the BNP result.  Consider restarting the HCTZ (1/2 pill).  Elevate legs.  I am unsure what is causing all of your symptoms. Please call your primary and follow up.  Take care  Dr.  [facility]       ED Prescriptions    None      Controlled Substance Prescriptions [place] Controlled Substance Registry consulted? Not Applicable   Coral Spikes, DO 08/06/18 2010

## 2018-08-06 NOTE — Discharge Instructions (Addendum)
Labs unrevealing. Awaiting the BNP result.  Consider restarting the HCTZ (1/2 pill).  Elevate legs.  I am unsure what is causing all of your symptoms. Please call your primary and follow up.  Take care  Dr. Lacinda Axon

## 2018-08-06 NOTE — ED Triage Notes (Signed)
Patient in today c/o LE Edema, fatigue, night sweats, urinary frequency and abdominal pain. Patient was diagnosed with RMSF 05/14/18. Patient states his symptoms never completely resolved. Patient saw urologist yesterday and medication was changed due to urinary frequency.

## 2018-08-10 ENCOUNTER — Ambulatory Visit
Admission: RE | Admit: 2018-08-10 | Discharge: 2018-08-10 | Disposition: A | Discharge status: Home / Self Care | Payer: Medicare Other | Source: Ambulatory Visit | Attending: Family Medicine | Admitting: Family Medicine

## 2018-08-10 ENCOUNTER — Other Ambulatory Visit: Payer: Self-pay | Admitting: Family Medicine

## 2018-08-10 ENCOUNTER — Telehealth (HOSPITAL_COMMUNITY): Payer: Self-pay

## 2018-08-10 DIAGNOSIS — N138 Other obstructive and reflux uropathy: Secondary | ICD-10-CM | POA: Diagnosis not present

## 2018-08-10 DIAGNOSIS — N400 Enlarged prostate without lower urinary tract symptoms: Secondary | ICD-10-CM | POA: Insufficient documentation

## 2018-08-10 DIAGNOSIS — R109 Unspecified abdominal pain: Secondary | ICD-10-CM

## 2018-08-10 DIAGNOSIS — Z981 Arthrodesis status: Secondary | ICD-10-CM | POA: Diagnosis not present

## 2018-08-10 DIAGNOSIS — I700 Atherosclerosis of aorta: Secondary | ICD-10-CM | POA: Diagnosis not present

## 2018-08-10 DIAGNOSIS — K8020 Calculus of gallbladder without cholecystitis without obstruction: Secondary | ICD-10-CM | POA: Diagnosis not present

## 2018-08-10 DIAGNOSIS — N401 Enlarged prostate with lower urinary tract symptoms: Secondary | ICD-10-CM | POA: Diagnosis not present

## 2018-08-10 DIAGNOSIS — G8929 Other chronic pain: Secondary | ICD-10-CM | POA: Diagnosis not present

## 2018-08-10 DIAGNOSIS — M545 Low back pain: Secondary | ICD-10-CM | POA: Diagnosis not present

## 2018-08-10 DIAGNOSIS — R1031 Right lower quadrant pain: Secondary | ICD-10-CM | POA: Diagnosis not present

## 2018-08-10 DIAGNOSIS — K37 Unspecified appendicitis: Secondary | ICD-10-CM

## 2018-08-10 DIAGNOSIS — R61 Generalized hyperhidrosis: Secondary | ICD-10-CM | POA: Diagnosis not present

## 2018-08-10 MED ORDER — IOHEXOL 300 MG/ML  SOLN
100.0000 mL | Freq: Once | INTRAMUSCULAR | Status: AC | PRN
  Administered 2018-08-10: 100 mL via INTRAVENOUS

## 2018-08-10 NOTE — Telephone Encounter (Signed)
Attempted to reach patient regarding results. No answer at this time. 

## 2018-08-11 DIAGNOSIS — R195 Other fecal abnormalities: Secondary | ICD-10-CM | POA: Diagnosis not present

## 2018-08-11 DIAGNOSIS — R1011 Right upper quadrant pain: Secondary | ICD-10-CM | POA: Diagnosis not present

## 2018-08-11 DIAGNOSIS — I700 Atherosclerosis of aorta: Secondary | ICD-10-CM | POA: Diagnosis not present

## 2018-08-11 DIAGNOSIS — D508 Other iron deficiency anemias: Secondary | ICD-10-CM | POA: Diagnosis not present

## 2018-08-11 DIAGNOSIS — K8020 Calculus of gallbladder without cholecystitis without obstruction: Secondary | ICD-10-CM | POA: Diagnosis not present

## 2018-08-11 NOTE — Telephone Encounter (Signed)
Pt advised.

## 2018-08-18 DIAGNOSIS — Z6831 Body mass index (BMI) 31.0-31.9, adult: Secondary | ICD-10-CM | POA: Diagnosis not present

## 2018-08-18 DIAGNOSIS — Z87891 Personal history of nicotine dependence: Secondary | ICD-10-CM | POA: Diagnosis not present

## 2018-08-18 DIAGNOSIS — R1011 Right upper quadrant pain: Secondary | ICD-10-CM | POA: Diagnosis not present

## 2018-08-18 DIAGNOSIS — E669 Obesity, unspecified: Secondary | ICD-10-CM | POA: Diagnosis not present

## 2018-08-18 DIAGNOSIS — R1031 Right lower quadrant pain: Secondary | ICD-10-CM | POA: Diagnosis not present

## 2018-08-18 DIAGNOSIS — R1012 Left upper quadrant pain: Secondary | ICD-10-CM | POA: Diagnosis not present

## 2018-08-18 DIAGNOSIS — F4321 Adjustment disorder with depressed mood: Secondary | ICD-10-CM | POA: Diagnosis not present

## 2018-08-18 DIAGNOSIS — K8020 Calculus of gallbladder without cholecystitis without obstruction: Secondary | ICD-10-CM | POA: Diagnosis not present

## 2018-09-08 DIAGNOSIS — F4321 Adjustment disorder with depressed mood: Secondary | ICD-10-CM | POA: Diagnosis not present

## 2018-09-21 DIAGNOSIS — E1165 Type 2 diabetes mellitus with hyperglycemia: Secondary | ICD-10-CM | POA: Diagnosis not present

## 2018-09-21 DIAGNOSIS — Z794 Long term (current) use of insulin: Secondary | ICD-10-CM | POA: Diagnosis not present

## 2018-09-22 ENCOUNTER — Encounter: Payer: Self-pay | Admitting: Oncology

## 2018-09-22 ENCOUNTER — Other Ambulatory Visit: Payer: Self-pay

## 2018-09-22 ENCOUNTER — Inpatient Hospital Stay: Payer: Medicare Other

## 2018-09-22 ENCOUNTER — Inpatient Hospital Stay: Payer: Medicare Other | Attending: Oncology | Admitting: Oncology

## 2018-09-22 VITALS — BP 87/53 | HR 71 | Temp 96.5°F | Ht 66.0 in | Wt 193.8 lb

## 2018-09-22 DIAGNOSIS — R0602 Shortness of breath: Secondary | ICD-10-CM

## 2018-09-22 DIAGNOSIS — D509 Iron deficiency anemia, unspecified: Secondary | ICD-10-CM

## 2018-09-22 DIAGNOSIS — Z8639 Personal history of other endocrine, nutritional and metabolic disease: Secondary | ICD-10-CM

## 2018-09-22 DIAGNOSIS — R61 Generalized hyperhidrosis: Secondary | ICD-10-CM | POA: Diagnosis not present

## 2018-09-22 DIAGNOSIS — Z87891 Personal history of nicotine dependence: Secondary | ICD-10-CM

## 2018-09-22 DIAGNOSIS — F4321 Adjustment disorder with depressed mood: Secondary | ICD-10-CM | POA: Diagnosis not present

## 2018-09-22 DIAGNOSIS — D649 Anemia, unspecified: Secondary | ICD-10-CM

## 2018-09-22 DIAGNOSIS — E039 Hypothyroidism, unspecified: Secondary | ICD-10-CM | POA: Diagnosis not present

## 2018-09-22 DIAGNOSIS — D6489 Other specified anemias: Secondary | ICD-10-CM | POA: Diagnosis not present

## 2018-09-22 DIAGNOSIS — N189 Chronic kidney disease, unspecified: Secondary | ICD-10-CM | POA: Diagnosis not present

## 2018-09-22 DIAGNOSIS — Z79899 Other long term (current) drug therapy: Secondary | ICD-10-CM

## 2018-09-22 DIAGNOSIS — I959 Hypotension, unspecified: Secondary | ICD-10-CM

## 2018-09-22 DIAGNOSIS — E538 Deficiency of other specified B group vitamins: Secondary | ICD-10-CM

## 2018-09-22 HISTORY — DX: Iron deficiency anemia, unspecified: D50.9

## 2018-09-22 LAB — CBC WITH DIFFERENTIAL/PLATELET
Abs Immature Granulocytes: 0.02 10*3/uL (ref 0.00–0.07)
BASOS PCT: 1 %
Basophils Absolute: 0 10*3/uL (ref 0.0–0.1)
EOS ABS: 0.3 10*3/uL (ref 0.0–0.5)
EOS PCT: 5 %
HCT: 40.6 % (ref 39.0–52.0)
Hemoglobin: 12.8 g/dL — ABNORMAL LOW (ref 13.0–17.0)
IMMATURE GRANULOCYTES: 0 %
LYMPHS ABS: 1.7 10*3/uL (ref 0.7–4.0)
Lymphocytes Relative: 33 %
MCH: 27.1 pg (ref 26.0–34.0)
MCHC: 31.5 g/dL (ref 30.0–36.0)
MCV: 85.8 fL (ref 80.0–100.0)
MONO ABS: 0.4 10*3/uL (ref 0.1–1.0)
MONOS PCT: 7 %
Neutro Abs: 2.8 10*3/uL (ref 1.7–7.7)
Neutrophils Relative %: 54 %
PLATELETS: 209 10*3/uL (ref 150–400)
RBC: 4.73 MIL/uL (ref 4.22–5.81)
RDW: 14 % (ref 11.5–15.5)
WBC: 5.2 10*3/uL (ref 4.0–10.5)
nRBC: 0 % (ref 0.0–0.2)

## 2018-09-22 LAB — RETIC PANEL
IMMATURE RETIC FRACT: 15.5 % (ref 2.3–15.9)
RBC.: 4.73 MIL/uL (ref 4.22–5.81)
RETIC COUNT ABSOLUTE: 75.7 10*3/uL (ref 19.0–186.0)
RETIC CT PCT: 1.6 % (ref 0.4–3.1)
RETICULOCYTE HEMOGLOBIN: 30.6 pg (ref >27.9)

## 2018-09-22 LAB — FOLATE: Folate: 16.6 ng/mL (ref >5.9)

## 2018-09-22 LAB — TSH: TSH: 5.545 u[IU]/mL — ABNORMAL HIGH (ref 0.350–4.500)

## 2018-09-22 LAB — IRON AND TIBC
Iron: 37 ug/dL — ABNORMAL LOW (ref 45–182)
SATURATION RATIOS: 9 % — AB (ref 17.9–39.5)
TIBC: 395 ug/dL (ref 250–450)
UIBC: 358 ug/dL

## 2018-09-22 LAB — TECHNOLOGIST SMEAR REVIEW: Tech Review: NORMAL

## 2018-09-22 LAB — FERRITIN: Ferritin: 12 ng/mL — ABNORMAL LOW (ref 24–336)

## 2018-09-22 NOTE — Addendum Note (Signed)
Addended by: Earlie Server on: 09/22/2018 10:30 PM   Modules accepted: Orders

## 2018-09-22 NOTE — Progress Notes (Addendum)
Hematology/Oncology Consult note [facility] Telephone:([phone] [phone]   Patient Care Team: Gayland Curry, MD as PCP - General (Family Medicine)  REFERRING PROVIDER: Gayland Curry, MD CHIEF COMPLAINTS/REASON FOR VISIT:  Evaluation of anemia  HISTORY OF PRESENTING ILLNESS:  Ryan Herrera is a  72 y.o.  male with PMH listed below who was referred to me for evaluation of anemia and hyperhidrosis.  Reviewed patient's recent labs that was done at [facility] office. Labs revealed anemia with hemoglobin of 12.8  Normal wbc and platelet count.   Reviewed patient's previous labs ordered by primary care physician's office, anemia is chronic onset , duration is since at least 2017.No aggravating or improving factors.  Associated signs and symptoms: Patient reports fatigue. Also has SOB with exertion.  Denies weight loss, easy bruising, hematochezia, hemoptysis, hematuria. Context: History of GI bleeding: denies               History of Chronic kidney disease: he has chronic kidney disease, follows up with Dr.Lateef               Last colonoscopy: not done.                History of iron deficiency, he reports not able to tolerate iron tablets.  Also reports sweating a lot, has to change pajama at night.  Blood pressure in the clinic is low at 87/53. Denies any dizziness, light headed.   Review of Systems  Constitutional: Positive for malaise/fatigue. Negative for chills, fever and weight loss.       Night sweating  HENT: Negative for sore throat.   Eyes: Negative for redness.  Respiratory: Negative for cough, shortness of breath and wheezing.   Cardiovascular: Negative for chest pain, palpitations and leg swelling.  Gastrointestinal: Negative for abdominal pain, blood in stool, nausea and vomiting.  Genitourinary: Negative for dysuria.  Musculoskeletal: Negative for myalgias.  Skin: Negative for rash.  Neurological: Negative for  dizziness, tingling and tremors.  Endo/Heme/Allergies: Does not bruise/bleed easily.  Psychiatric/Behavioral: Negative for hallucinations.    MEDICAL HISTORY:  Past Medical History:  Diagnosis Date  . Arthritis   . Coronary artery disease   . Diabetes mellitus without complication ([place])   . GERD (gastroesophageal reflux disease)   . Hypertension   . IBS (irritable bowel syndrome)   . Thyroid disease     SURGICAL HISTORY: Past Surgical History:  Procedure Laterality Date  . BACK SURGERY    . HERNIA REPAIR    . HYDROCELE EXCISION / REPAIR    . JOINT REPLACEMENT    . TONSILLECTOMY      SOCIAL HISTORY: Social History   Socioeconomic History  . Marital status: Single    Spouse name: Not on file  . Number of children: Not on file  . Years of education: Not on file  . Highest education level: Not on file  Occupational History  . Occupation: retired  Scientific laboratory technician  . Financial resource strain: Not on file  . Food insecurity:    Worry: Not on file    Inability: Not on file  . Transportation needs:    Medical: Not on file    Non-medical: Not on file  Tobacco Use  . Smoking status: Former Smoker    Packs/day: 1.00    Years: 38.00    Pack years: 38.00    Last attempt to quit: 12/1996    Years since quitting: 21.8  . Smokeless tobacco: Never Used  Substance  and Sexual Activity  . Alcohol use: Yes    Comment: rarely  . Drug use: No  . Sexual activity: Not on file  Lifestyle  . Physical activity:    Days per week: Not on file    Minutes per session: Not on file  . Stress: Not on file  Relationships  . Social connections:    Talks on phone: Not on file    Gets together: Not on file    Attends religious service: Not on file    Active member of club or organization: Not on file    Attends meetings of clubs or organizations: Not on file    Relationship status: Not on file  . Intimate partner violence:    Fear of current or ex partner: Not on file    Emotionally  abused: Not on file    Physically abused: Not on file    Forced sexual activity: Not on file  Other Topics Concern  . Not on file  Social History Narrative  . Not on file    FAMILY HISTORY: Family History  Problem Relation Age of Onset  . Hypertension Mother   . Heart attack Mother   . Osteoarthritis Mother   . Stroke Father   . Hypertension Father   . Stroke Sister   . Stroke Sister     ALLERGIES:  is allergic to niacin and related and statins.  MEDICATIONS:  Current Outpatient Medications  Medication Sig Dispense Refill  . allopurinol (ZYLOPRIM) 100 MG tablet Take by mouth.    Marland Kitchen aspirin EC 325 MG tablet Take 325 mg by mouth daily.    . B-12 MICROLOZENGE 500 MCG SUBL DISSOLVE 2 TABLETS UNDER THE TONGUE ONCE DAILY  11  . ezetimibe (ZETIA) 10 MG tablet Take 10 mg by mouth daily.    Marland Kitchen glipiZIDE (GLUCOTROL) 10 MG tablet Take 10 mg by mouth 2 (two) times daily before a meal.    . hydrochlorothiazide (HYDRODIURIL) 25 MG tablet Take 25 mg by mouth daily.    . hydrocortisone-pramoxine (ANALPRAM-HC) 2.5-1 % rectal cream Place 1 application rectally 3 (three) times daily. 30 g 0  . insulin degludec (TRESIBA) 100 UNIT/ML SOPN FlexTouch Pen Inject into the skin.    Marland Kitchen levothyroxine (SYNTHROID, LEVOTHROID) 75 MCG tablet Take 88 mcg by mouth daily before breakfast.     . lisinopril (PRINIVIL,ZESTRIL) 10 MG tablet Take 10 mg by mouth daily.    . magnesium oxide (MAG-OX) 400 MG tablet Take 400 mg by mouth daily.    . metFORMIN (GLUCOPHAGE) 1000 MG tablet Take 1,000 mg by mouth 2 (two) times daily with a meal.    . metoprolol succinate (TOPROL-XL) 25 MG 24 hr tablet Take 25 mg by mouth 2 (two) times daily.    . mirabegron ER (MYRBETRIQ) 25 MG TB24 tablet Take 1 tablet by mouth daily.    Marland Kitchen omeprazole (PRILOSEC) 40 MG capsule Take 40 mg by mouth daily.    . polyethylene glycol powder (GLYCOLAX/MIRALAX) powder 17 g daily as needed for constipation. 500 g 0  . rosuvastatin (CRESTOR) 5 MG tablet  Take 5 mg by mouth daily.    . tadalafil (CIALIS) 5 MG tablet Take 5 mg by mouth daily as needed for erectile dysfunction.    . valACYclovir (VALTREX) 500 MG tablet Take 500 mg by mouth 2 (two) times daily.    . insulin detemir (LEVEMIR) 100 UNIT/ML injection Inject 21 Units into the skin at bedtime.    Marland Kitchen JARDIANCE 10  MG TABS tablet Take 10 mg by mouth every morning.  3   No current facility-administered medications for this visit.      PHYSICAL EXAMINATION: ECOG PERFORMANCE STATUS: 1 - Symptomatic but completely ambulatory Vitals:   09/22/18 1347  BP: (!) 87/53  Pulse: 71  Temp: (!) 96.5 F (35.8 C)   Filed Weights   09/22/18 1347  Weight: 193 lb 12.8 oz (87.9 kg)    Physical Exam Constitutional:      General: He is not in acute distress. HENT:     Head: Normocephalic and atraumatic.  Eyes:     General: No scleral icterus.    Pupils: Pupils are equal, round, and reactive to light.  Neck:     Musculoskeletal: Normal range of motion and neck supple.  Cardiovascular:     Rate and Rhythm: Normal rate and regular rhythm.     Heart sounds: Normal heart sounds.  Pulmonary:     Effort: Pulmonary effort is normal. No respiratory distress.     Breath sounds: No wheezing.  Abdominal:     General: Bowel sounds are normal. There is no distension.     Palpations: Abdomen is soft. There is no mass.     Tenderness: There is no abdominal tenderness.  Musculoskeletal: Normal range of motion.        General: No deformity.  Skin:    General: Skin is warm and dry.     Findings: No erythema or rash.  Neurological:     Mental Status: He is alert and oriented to person, place, and time.     Cranial Nerves: No cranial nerve deficit.     Coordination: Coordination normal.  Psychiatric:        Behavior: Behavior normal.        Thought Content: Thought content normal.      LABORATORY DATA:  I have reviewed the data as listed Lab Results  Component Value Date   WBC 5.2 09/22/2018     HGB 12.8 (L) 09/22/2018   HCT 40.6 09/22/2018   MCV 85.8 09/22/2018   PLT 209 09/22/2018   Recent Labs    04/17/18 1439 08/06/18 1925  NA 136 138  K 4.3 4.1  CL 102 102  CO2 23 25  GLUCOSE 233* 221*  BUN 23 21  CREATININE 1.27* 1.18  CALCIUM 8.8* 9.3  GFRNONAA 54* 59*  GFRAA >60 >60  PROT 6.9 7.2  ALBUMIN 3.7 3.9  AST 27 25  ALT 20 22  ALKPHOS 51 56  BILITOT 0.6 0.6   Iron/TIBC/Ferritin/ %Sat    Component Value Date/Time   IRON 37 (L) 09/22/2018 1437   TIBC 395 09/22/2018 1437   FERRITIN 12 (L) 09/22/2018 1437   IRONPCTSAT 9 (L) 09/22/2018 1437        ASSESSMENT & PLAN:  1. Iron deficiency anemia, unspecified iron deficiency anemia type   2. History of non anemic vitamin B12 deficiency   3. Night sweat   4. Hypotension, unspecified hypotension type    Reviewed patient's labs. He is mild anemia with hemoglobin 12.8 Anemia: multifactorial with possible causes including chronic blood loss, hyper/hypothyroidism, nutritional deficiency, infection/chronic inflammation, hemolysis, underlying bone marrow disorders. Will check CBC w differential, CMP, vitamin B12, Folate, iron/TIBC, ferritin, reticulocytes, fecal occult, blood smear, TSH,  LDH,  monoclonal gammopathy evaluation.    Night sweating, unknown etiology. Check TSH.  He has history of hypothyroidism, on Synthroid.  Hypotension, he is on anti hypertensive advise patient to monitor blood pressure  at home. Need to hold BP meds as BP is low..   Labs reviewed. Iron panel showed iron deficiency. He cannot tolerate oral iron supplements.  Plan IV iron with Feraheme 549m weekly x 2. Allergy reactions/infusion reaction including anaphylactic reaction discussed with patient. Other side effects include but not limited to high blood pressure, skin rash, weight gain, leg swelling, etc. Patient voices understanding and willing to proceed. Patient prefers to get IV iron infusion after October 07, 2018. We will  arrange.  #Hypotension, BP is 87/53.  Discussed with patient that I am concerned due to his low blood pressure and his blood pressure regimen.  He is asymptomatic. He tells me that that has been a chronic issue for him.  I recommend him to have a discussion with primary care physician and his cardiologist regarding adjusting blood pressure medication.   Orders Placed This Encounter  Procedures  . CBC with Differential/Platelet    Standing Status:   Future    Number of Occurrences:   1    Standing Expiration Date:   09/23/2019  . Technologist smear review    Standing Status:   Future    Number of Occurrences:   1    Standing Expiration Date:   09/23/2019  . TSH    Standing Status:   Future    Number of Occurrences:   1    Standing Expiration Date:   09/23/2019  . Retic Panel    Standing Status:   Future    Number of Occurrences:   1    Standing Expiration Date:   09/23/2019  . Multiple Myeloma Panel (SPEP&IFE w/QIG)    Standing Status:   Future    Number of Occurrences:   1    Standing Expiration Date:   09/22/2019  . Kappa/lambda light chains    Standing Status:   Future    Number of Occurrences:   1    Standing Expiration Date:   09/22/2019  . Vitamin B12    Standing Status:   Future    Number of Occurrences:   1    Standing Expiration Date:   09/23/2019  . Folate    Standing Status:   Future    Number of Occurrences:   1    Standing Expiration Date:   09/23/2019  . Iron and TIBC    Standing Status:   Future    Number of Occurrences:   1    Standing Expiration Date:   09/23/2019  . Ferritin    Standing Status:   Future    Number of Occurrences:   1    Standing Expiration Date:   09/23/2019    All questions were answered. The patient knows to call the clinic with any problems questions or concerns.  Return of visit: 4 weeks.  Thank you for this kind referral and the opportunity to participate in the care of this patient. A copy of today's note is routed to referring  provider  Total face to face encounter time for this patient visit was 462m. >50% of the time was  spent in counseling and coordination of care.    ZhEarlie ServerMD, PhD Hematology Oncology [facility]- [phone]/17/2019

## 2018-09-22 NOTE — Progress Notes (Signed)
Patient here for initial visit. Blood pressure is low. Pt states this is normal for him.

## 2018-09-23 LAB — VITAMIN B12: Vitamin B-12: 573 pg/mL (ref 180–914)

## 2018-09-23 LAB — KAPPA/LAMBDA LIGHT CHAINS
Kappa free light chain: 34.5 mg/L — ABNORMAL HIGH (ref 3.3–19.4)
Kappa, lambda light chain ratio: 1.51 (ref 0.26–1.65)
Lambda free light chains: 22.9 mg/L (ref 5.7–26.3)

## 2018-09-29 LAB — MULTIPLE MYELOMA PANEL, SERUM
ALBUMIN SERPL ELPH-MCNC: 3.8 g/dL (ref 2.9–4.4)
ALPHA 1: 0.2 g/dL (ref 0.0–0.4)
Albumin/Glob SerPl: 1.4 (ref 0.7–1.7)
Alpha2 Glob SerPl Elph-Mcnc: 0.7 g/dL (ref 0.4–1.0)
B-Globulin SerPl Elph-Mcnc: 1 g/dL (ref 0.7–1.3)
GAMMA GLOB SERPL ELPH-MCNC: 0.9 g/dL (ref 0.4–1.8)
GLOBULIN, TOTAL: 2.9 g/dL (ref 2.2–3.9)
IGA: 313 mg/dL (ref 61–437)
IgG (Immunoglobin G), Serum: 1028 mg/dL (ref 700–1600)
IgM (Immunoglobulin M), Srm: 68 mg/dL (ref 15–143)
Total Protein ELP: 6.7 g/dL (ref 6.0–8.5)

## 2018-10-06 DIAGNOSIS — F4321 Adjustment disorder with depressed mood: Secondary | ICD-10-CM | POA: Diagnosis not present

## 2018-10-08 ENCOUNTER — Encounter: Payer: Self-pay | Admitting: *Deleted

## 2018-10-08 ENCOUNTER — Inpatient Hospital Stay: Payer: Medicare Other | Attending: Oncology | Admitting: Oncology

## 2018-10-08 ENCOUNTER — Telehealth: Payer: Self-pay | Admitting: Oncology

## 2018-10-08 ENCOUNTER — Encounter: Payer: Self-pay | Admitting: Oncology

## 2018-10-08 ENCOUNTER — Other Ambulatory Visit: Payer: Self-pay

## 2018-10-08 VITALS — BP 104/67 | HR 72 | Temp 96.9°F | Resp 18 | Wt 196.3 lb

## 2018-10-08 DIAGNOSIS — Z7982 Long term (current) use of aspirin: Secondary | ICD-10-CM | POA: Diagnosis not present

## 2018-10-08 DIAGNOSIS — K219 Gastro-esophageal reflux disease without esophagitis: Secondary | ICD-10-CM | POA: Insufficient documentation

## 2018-10-08 DIAGNOSIS — R61 Generalized hyperhidrosis: Secondary | ICD-10-CM | POA: Diagnosis not present

## 2018-10-08 DIAGNOSIS — I10 Essential (primary) hypertension: Secondary | ICD-10-CM | POA: Insufficient documentation

## 2018-10-08 DIAGNOSIS — D509 Iron deficiency anemia, unspecified: Secondary | ICD-10-CM

## 2018-10-08 DIAGNOSIS — E079 Disorder of thyroid, unspecified: Secondary | ICD-10-CM | POA: Insufficient documentation

## 2018-10-08 DIAGNOSIS — Z794 Long term (current) use of insulin: Secondary | ICD-10-CM | POA: Diagnosis not present

## 2018-10-08 DIAGNOSIS — K589 Irritable bowel syndrome without diarrhea: Secondary | ICD-10-CM | POA: Diagnosis not present

## 2018-10-08 DIAGNOSIS — E119 Type 2 diabetes mellitus without complications: Secondary | ICD-10-CM | POA: Diagnosis not present

## 2018-10-08 DIAGNOSIS — Z87891 Personal history of nicotine dependence: Secondary | ICD-10-CM | POA: Diagnosis not present

## 2018-10-08 DIAGNOSIS — Z79899 Other long term (current) drug therapy: Secondary | ICD-10-CM | POA: Diagnosis not present

## 2018-10-08 DIAGNOSIS — M1990 Unspecified osteoarthritis, unspecified site: Secondary | ICD-10-CM | POA: Diagnosis not present

## 2018-10-08 DIAGNOSIS — I2510 Atherosclerotic heart disease of native coronary artery without angina pectoris: Secondary | ICD-10-CM | POA: Insufficient documentation

## 2018-10-08 NOTE — Progress Notes (Signed)
Patient here for follow up. Pt continues to have night sweats. He concerned that he gets tired very fast.

## 2018-10-08 NOTE — Telephone Encounter (Signed)
Almyra Free will call patient to advise of changes. Updated appt schd also mailed to patient.

## 2018-10-08 NOTE — Progress Notes (Signed)
Hematology/Oncology Consult note [facility] Telephone:([phone] [phone]   Patient Care Team: Gayland Curry, MD as PCP - General (Family Medicine)  REFERRING PROVIDER: Gayland Curry, MD CHIEF COMPLAINTS/REASON FOR VISIT:  Evaluation of anemia  HISTORY OF PRESENTING ILLNESS:  Ryan Herrera is a  73 y.o.  male with PMH listed below who was referred to me for evaluation of anemia and hyperhidrosis.  Reviewed patient's recent labs that was done at [facility] office. Labs revealed anemia with hemoglobin of 12.8  Normal wbc and platelet count.   Reviewed patient's previous labs ordered by primary care physician's office, anemia is chronic onset , duration is since at least 2017.No aggravating or improving factors.  Associated signs and symptoms: Patient reports fatigue. Also has SOB with exertion.  Denies weight loss, easy bruising, hematochezia, hemoptysis, hematuria. Context: History of GI bleeding: denies               History of Chronic kidney disease: he has chronic kidney disease, follows up with Dr.Lateef               Last colonoscopy: not done.                History of iron deficiency, he reports not able to tolerate iron tablets.  Also reports sweating a lot, has to change pajama at night.  Blood pressure in the clinic is low at 87/53. Denies any dizziness, light headed.   INTERVAL HISTORY [name] is a 73 y.o. male who has above history reviewed by me today presents for follow up visit for management of iron deficiency anemia. During the interval, patient has had lab work-up done which indicates iron deficiency.  I have called the patient and recommend IV iron treatments. Patient prefers to start IV iron treatments in January. Patient presented to further discuss his lab work-up results and further management. Continue to feel tired and lack of energy.  Denies any chest pain, shortness of breath, abdominal  pain, blood in the stool or urine. Blood pressure has improved to 104/67 today.  Denies any dizziness.   Review of Systems  Constitutional: Positive for malaise/fatigue. Negative for chills, fever and weight loss.       Night sweating  HENT: Negative for sore throat.   Eyes: Negative for redness.  Respiratory: Negative for cough, shortness of breath and wheezing.   Cardiovascular: Negative for chest pain, palpitations and leg swelling.  Gastrointestinal: Negative for abdominal pain, blood in stool, nausea and vomiting.  Genitourinary: Negative for dysuria.  Musculoskeletal: Negative for myalgias.  Skin: Negative for rash.  Neurological: Negative for dizziness, tingling and tremors.  Endo/Heme/Allergies: Does not bruise/bleed easily.  Psychiatric/Behavioral: Negative for hallucinations.    MEDICAL HISTORY:  Past Medical History:  Diagnosis Date  . Arthritis   . Coronary artery disease   . Diabetes mellitus without complication ([place])   . GERD (gastroesophageal reflux disease)   . Hypertension   . IBS (irritable bowel syndrome)   . Iron deficiency anemia 09/22/2018  . Thyroid disease     SURGICAL HISTORY: Past Surgical History:  Procedure Laterality Date  . BACK SURGERY    . HERNIA REPAIR    . HYDROCELE EXCISION / REPAIR    . JOINT REPLACEMENT    . TONSILLECTOMY      SOCIAL HISTORY: Social History   Socioeconomic History  . Marital status: Single    Spouse name: Not on file  . Number of children: Not on file  .  Years of education: Not on file  . Highest education level: Not on file  Occupational History  . Occupation: retired  Scientific laboratory technician  . Financial resource strain: Not on file  . Food insecurity:    Worry: Not on file    Inability: Not on file  . Transportation needs:    Medical: Not on file    Non-medical: Not on file  Tobacco Use  . Smoking status: Former Smoker    Packs/day: 1.00    Years: 38.00    Pack years: 38.00    Last attempt to quit:  12/1996    Years since quitting: 21.8  . Smokeless tobacco: Never Used  Substance and Sexual Activity  . Alcohol use: Yes    Comment: rarely  . Drug use: No  . Sexual activity: Not on file  Lifestyle  . Physical activity:    Days per week: Not on file    Minutes per session: Not on file  . Stress: Not on file  Relationships  . Social connections:    Talks on phone: Not on file    Gets together: Not on file    Attends religious service: Not on file    Active member of club or organization: Not on file    Attends meetings of clubs or organizations: Not on file    Relationship status: Not on file  . Intimate partner violence:    Fear of current or ex partner: Not on file    Emotionally abused: Not on file    Physically abused: Not on file    Forced sexual activity: Not on file  Other Topics Concern  . Not on file  Social History Narrative  . Not on file    FAMILY HISTORY: Family History  Problem Relation Age of Onset  . Hypertension Mother   . Heart attack Mother   . Osteoarthritis Mother   . Stroke Father   . Hypertension Father   . Stroke Sister   . Stroke Sister     ALLERGIES:  is allergic to niacin and related and statins.  MEDICATIONS:  Current Outpatient Medications  Medication Sig Dispense Refill  . allopurinol (ZYLOPRIM) 100 MG tablet Take by mouth.    Marland Kitchen aspirin EC 325 MG tablet Take 81 mg by mouth daily.     . B-12 MICROLOZENGE 500 MCG SUBL DISSOLVE 2 TABLETS UNDER THE TONGUE ONCE DAILY  11  . ezetimibe (ZETIA) 10 MG tablet Take 10 mg by mouth daily.    Marland Kitchen glipiZIDE (GLUCOTROL) 10 MG tablet Take 10 mg by mouth 2 (two) times daily before a meal.    . hydrochlorothiazide (HYDRODIURIL) 25 MG tablet Take 25 mg by mouth daily.    . hydrocortisone-pramoxine (ANALPRAM-HC) 2.5-1 % rectal cream Place 1 application rectally 3 (three) times daily. 30 g 0  . insulin degludec (TRESIBA) 100 UNIT/ML SOPN FlexTouch Pen Inject into the skin.    Marland Kitchen insulin detemir (LEVEMIR)  100 UNIT/ML injection Inject 21 Units into the skin at bedtime.    Marland Kitchen JARDIANCE 10 MG TABS tablet Take 10 mg by mouth every morning.  3  . levothyroxine (SYNTHROID, LEVOTHROID) 75 MCG tablet Take 88 mcg by mouth daily before breakfast.     . lisinopril (PRINIVIL,ZESTRIL) 10 MG tablet Take 10 mg by mouth daily.    . magnesium oxide (MAG-OX) 400 MG tablet Take 400 mg by mouth daily.    . metFORMIN (GLUCOPHAGE) 1000 MG tablet Take 1,000 mg by mouth 2 (two) times  daily with a meal.    . metoprolol succinate (TOPROL-XL) 25 MG 24 hr tablet Take 25 mg by mouth 2 (two) times daily.    . mirabegron ER (MYRBETRIQ) 25 MG TB24 tablet Take 1 tablet by mouth daily.    Marland Kitchen omeprazole (PRILOSEC) 40 MG capsule Take 40 mg by mouth daily.    . polyethylene glycol powder (GLYCOLAX/MIRALAX) powder 17 g daily as needed for constipation. 500 g 0  . tadalafil (CIALIS) 5 MG tablet Take 5 mg by mouth daily as needed for erectile dysfunction.    . valACYclovir (VALTREX) 500 MG tablet Take 500 mg by mouth 2 (two) times daily.    . rosuvastatin (CRESTOR) 5 MG tablet Take 5 mg by mouth daily.     No current facility-administered medications for this visit.      PHYSICAL EXAMINATION: ECOG PERFORMANCE STATUS: 1 - Symptomatic but completely ambulatory Vitals:   10/08/18 1348  BP: 104/67  Pulse: 72  Resp: 18  Temp: (!) 96.9 F (36.1 C)   Filed Weights   10/08/18 1348  Weight: 196 lb 4.8 oz (89 kg)    Physical Exam Constitutional:      General: He is not in acute distress. HENT:     Head: Normocephalic and atraumatic.  Eyes:     General: No scleral icterus.    Pupils: Pupils are equal, round, and reactive to light.  Neck:     Musculoskeletal: Normal range of motion and neck supple.  Cardiovascular:     Rate and Rhythm: Normal rate and regular rhythm.     Heart sounds: Normal heart sounds.  Pulmonary:     Effort: Pulmonary effort is normal. No respiratory distress.     Breath sounds: No wheezing.    Abdominal:     General: Bowel sounds are normal. There is no distension.     Palpations: Abdomen is soft. There is no mass.     Tenderness: There is no abdominal tenderness.  Musculoskeletal: Normal range of motion.        General: No deformity.  Skin:    General: Skin is warm and dry.     Findings: No erythema or rash.  Neurological:     Mental Status: He is alert and oriented to person, place, and time.     Cranial Nerves: No cranial nerve deficit.     Coordination: Coordination normal.  Psychiatric:        Behavior: Behavior normal.        Thought Content: Thought content normal.      LABORATORY DATA:  I have reviewed the data as listed Lab Results  Component Value Date   WBC 5.2 09/22/2018   HGB 12.8 (L) 09/22/2018   HCT 40.6 09/22/2018   MCV 85.8 09/22/2018   PLT 209 09/22/2018   Recent Labs    04/17/18 1439 08/06/18 1925  NA 136 138  K 4.3 4.1  CL 102 102  CO2 23 25  GLUCOSE 233* 221*  BUN 23 21  CREATININE 1.27* 1.18  CALCIUM 8.8* 9.3  GFRNONAA 54* 59*  GFRAA >60 >60  PROT 6.9 7.2  ALBUMIN 3.7 3.9  AST 27 25  ALT 20 22  ALKPHOS 51 56  BILITOT 0.6 0.6   Iron/TIBC/Ferritin/ %Sat    Component Value Date/Time   IRON 37 (L) 09/22/2018 1437   TIBC 395 09/22/2018 1437   FERRITIN 12 (L) 09/22/2018 1437   IRONPCTSAT 9 (L) 09/22/2018 1437        ASSESSMENT &  PLAN:  1. Iron deficiency anemia, unspecified iron deficiency anemia type   2. Night sweat    #Labs are reviewed and discussed with patient. Mild anemia with hemoglobin 12.8, iron panel indicates iron deficiency. Plan IV iron with Feraheme 586m weekly x 2. Allergy reactions/infusion reaction including anaphylactic reaction discussed with patient. Other side effects include but not limited to high blood pressure, skin rash, weight gain, leg swelling, etc. Patient voices understanding and willing to proceed.  Multiple myeloma panel negative, normal B12, folate, TSH. We will see how he does  after IV iron treatments.  Orders Placed This Encounter  Procedures  . CBC with Differential/Platelet    Standing Status:   Future    Standing Expiration Date:   10/09/2019  . Iron and TIBC    Standing Status:   Future    Standing Expiration Date:   10/09/2019  . Ferritin    Standing Status:   Future    Standing Expiration Date:   10/09/2019    All questions were answered. The patient knows to call the clinic with any problems questions or concerns.  Return of visit: 8 to 9 weeks for reevaluation.  Check CBC, iron, ferritin prior to the visit.  Possible additional Feraheme.   ZEarlie Server MD, PhD Hematology Oncology CSouthwest Lincoln [facility] LLCat ACopper Hills Youth CenterPager- [phone]/11/2018

## 2018-10-08 NOTE — Telephone Encounter (Signed)
Cancel last 2 doses of FERAHEME, per Dr. Noralee Stain. Will call patient to notify that last 2 doses are not needed.

## 2018-10-15 ENCOUNTER — Encounter: Payer: Self-pay | Admitting: Oncology

## 2018-10-15 ENCOUNTER — Inpatient Hospital Stay: Payer: Medicare Other

## 2018-10-15 VITALS — BP 98/59 | HR 77 | Temp 97.9°F | Resp 18

## 2018-10-15 DIAGNOSIS — D509 Iron deficiency anemia, unspecified: Secondary | ICD-10-CM

## 2018-10-15 MED ORDER — SODIUM CHLORIDE 0.9 % IV SOLN
510.0000 mg | Freq: Once | INTRAVENOUS | Status: AC
  Administered 2018-10-15: 510 mg via INTRAVENOUS
  Filled 2018-10-15: qty 17

## 2018-10-15 MED ORDER — SODIUM CHLORIDE 0.9 % IV SOLN
Freq: Once | INTRAVENOUS | Status: AC
  Administered 2018-10-15: 12:00:00 via INTRAVENOUS
  Filled 2018-10-15: qty 250

## 2018-10-15 MED ORDER — DIPHENHYDRAMINE HCL 25 MG PO TABS
25.0000 mg | ORAL_TABLET | Freq: Once | ORAL | Status: AC
  Administered 2018-10-15: 25 mg via ORAL
  Filled 2018-10-15: qty 1

## 2018-10-15 NOTE — Progress Notes (Signed)
Infusion Reaction Progress Note  Date:10/15/18  Time:12:00  Pre-medications given: None   Medication given: 510 mg IV Feraheme  Reaction Type: GI upset; non-specific and tingeling sensation in bilateral fingers and hands  Hx of reaction: No. First dose today.   Reaction medication: 25 mg PO benadryl  Re-challenge: Reaction occurred at the end of infusion.  Assessment  Physical Exam  Constitutional: He is oriented to person, place, and time and well-developed, well-nourished, and in no distress. Vital signs are normal.  HENT:  Head: Normocephalic and atraumatic.  Eyes: Pupils are equal, round, and reactive to light.  Neck: Normal range of motion.  Cardiovascular: Normal rate, regular rhythm and normal heart sounds.  No murmur heard. Pulmonary/Chest: Effort normal and breath sounds normal. He has no wheezes.  Abdominal: Soft. Normal appearance and bowel sounds are normal. He exhibits no distension. There is no abdominal tenderness.  Musculoskeletal: Normal range of motion.        General: No edema.  Neurological: He is alert and oriented to person, place, and time. Gait normal.  Skin: Skin is warm and dry. No rash noted.  Psychiatric: Mood, memory, affect and judgment normal.   All vital signs are stable.  Soft blood pressure.  Patient states this is normal for him.  Heart rate stable.  Lungs are clear.  Plan: Spoke with Dr. Tasia Catchings, patient's hematologist.  Will premedicate with 25 mg Benadryl IV and 10 mg dexamethasone next week for second infusion.  Patient's treatment plan updated.  Faythe Casa, NP 10/15/2018 12:28 PM

## 2018-10-15 NOTE — Progress Notes (Signed)
25 mg of Benadryl ordered per Sonia Baller NP at the end of feraheme infusion due to complaints of GI upset and tingling in his hands and fingers.  No signs of shortness of breath. Vital signs are stable.

## 2018-10-16 DIAGNOSIS — H2513 Age-related nuclear cataract, bilateral: Secondary | ICD-10-CM | POA: Insufficient documentation

## 2018-10-22 ENCOUNTER — Inpatient Hospital Stay: Payer: Medicare Other

## 2018-10-22 VITALS — BP 122/75 | HR 72 | Temp 98.1°F | Resp 20

## 2018-10-22 DIAGNOSIS — D509 Iron deficiency anemia, unspecified: Secondary | ICD-10-CM

## 2018-10-22 MED ORDER — SODIUM CHLORIDE 0.9 % IV SOLN: 10.0000 mg | Freq: Once | INTRAVENOUS | Status: DC

## 2018-10-22 MED ORDER — DEXAMETHASONE SODIUM PHOSPHATE 10 MG/ML IJ SOLN
10.0000 mg | Freq: Once | INTRAMUSCULAR | Status: AC
  Administered 2018-10-22: 10 mg via INTRAVENOUS
  Filled 2018-10-22: qty 1

## 2018-10-22 MED ORDER — DIPHENHYDRAMINE HCL 50 MG/ML IJ SOLN
25.0000 mg | Freq: Once | INTRAMUSCULAR | Status: AC
  Administered 2018-10-22: 25 mg via INTRAVENOUS
  Filled 2018-10-22: qty 1

## 2018-10-22 MED ORDER — SODIUM CHLORIDE 0.9 % IV SOLN
Freq: Once | INTRAVENOUS | Status: AC
  Administered 2018-10-22: 14:00:00 via INTRAVENOUS
  Filled 2018-10-22: qty 250

## 2018-10-22 MED ORDER — SODIUM CHLORIDE 0.9 % IV SOLN
510.0000 mg | Freq: Once | INTRAVENOUS | Status: AC
  Administered 2018-10-22: 510 mg via INTRAVENOUS
  Filled 2018-10-22: qty 17

## 2018-10-29 ENCOUNTER — Ambulatory Visit: Payer: Medicare Other

## 2018-11-04 ENCOUNTER — Ambulatory Visit: Payer: Medicare Other | Admitting: Gastroenterology

## 2018-11-05 ENCOUNTER — Ambulatory Visit: Payer: Medicare Other

## 2018-11-12 ENCOUNTER — Other Ambulatory Visit: Payer: Self-pay

## 2018-11-12 ENCOUNTER — Ambulatory Visit
Admission: EM | Admit: 2018-11-12 | Discharge: 2018-11-12 | Disposition: A | Discharge status: Home / Self Care | Payer: Medicare Other | Attending: Family Medicine | Admitting: Family Medicine

## 2018-11-12 ENCOUNTER — Ambulatory Visit: Payer: Medicare Other

## 2018-11-12 DIAGNOSIS — J069 Acute upper respiratory infection, unspecified: Secondary | ICD-10-CM | POA: Diagnosis present

## 2018-11-12 MED ORDER — BENZONATATE 200 MG PO CAPS: ORAL_CAPSULE | ORAL | 0 refills | Status: DC

## 2018-11-12 MED ORDER — HYDROCOD POLST-CPM POLST ER 10-8 MG/5ML PO SUER: 2.5000 mL | Freq: Two times a day (BID) | ORAL | 0 refills | Status: DC

## 2018-11-12 NOTE — ED Triage Notes (Signed)
Patient complains of cough, congestion and achiness all over that started 3 days ago. Patient states that he has been unable to sleep due to cough. States that he has a history of pneumonia and is concerned this could happen again.

## 2018-11-12 NOTE — ED Provider Notes (Addendum)
[facility]    CSN: [id number] Arrival date & time: 11/12/18  1505     History   Chief Complaint Chief Complaint  Patient presents with  . Cough    HPI Ryan Herrera is a 73 y.o. male.   HPI  -year-old male presents with complaints of cough congestion and body aches that started about 3 days ago.  His night was miserable because he was unable to sleep due to the cough.  He is also been coughing all day.  His illness today was preceded by a GII illness where he had nausea vomiting and diarrhea.  Seemingly stopped  for short period of time and now he has this.  He is afebrile; rate of 80 O2 sats 96% on room air.  He is concerned that he may be developing a pneumonia.         Past Medical History:  Diagnosis Date  . Arthritis   . Coronary artery disease   . Diabetes mellitus without complication ([place])   . GERD (gastroesophageal reflux disease)   . Hypertension   . IBS (irritable bowel syndrome)   . Iron deficiency anemia 09/22/2018  . Thyroid disease     Patient Active Problem List   Diagnosis Date Noted  . Iron deficiency anemia 09/22/2018    Past Surgical History:  Procedure Laterality Date  . BACK SURGERY    . HERNIA REPAIR    . HYDROCELE EXCISION / REPAIR    . JOINT REPLACEMENT    . TONSILLECTOMY         Home Medications    Prior to Admission medications   Medication Sig Start Date End Date Taking? Authorizing Provider  allopurinol (ZYLOPRIM) 100 MG tablet Take by mouth. 07/20/18  Yes [provider]  aspirin EC 81 MG tablet Take 81 mg by mouth daily.   Yes [provider]  B-12 MICROLOZENGE 500 MCG SUBL DISSOLVE 2 TABLETS UNDER THE TONGUE ONCE DAILY 07/14/18  Yes [provider]  ezetimibe (ZETIA) 10 MG tablet Take 10 mg by mouth daily.   Yes [provider]  glipiZIDE (GLUCOTROL) 10 MG tablet Take 10 mg by mouth 2 (two) times daily before a meal.   Yes [provider]    hydrochlorothiazide (HYDRODIURIL) 25 MG tablet Take 25 mg by mouth daily.   Yes [provider]  hydrocortisone-pramoxine [facility]) 2.5-1 % rectal cream Place 1 application rectally 3 (three) times daily. 10/02/17  Yes Cook, Jayce G, DO  insulin degludec (TRESIBA) 100 UNIT/ML SOPN FlexTouch Pen Inject into the skin. 09/21/18 09/21/19 Yes [provider]  JARDIANCE 10 MG TABS tablet Take 10 mg by mouth every morning. 06/30/18  Yes [provider]  levothyroxine (SYNTHROID, LEVOTHROID) 75 MCG tablet Take 88 mcg by mouth daily before breakfast.    Yes [provider]  lisinopril (PRINIVIL,ZESTRIL) 10 MG tablet Take 10 mg by mouth daily.   Yes [provider]  magnesium oxide (MAG-OX) 400 MG tablet Take 400 mg by mouth daily.   Yes [provider]  metFORMIN (GLUCOPHAGE) 1000 MG tablet Take 1,000 mg by mouth 2 (two) times daily with a meal.   Yes [provider]  metoprolol succinate (TOPROL-XL) 25 MG 24 hr tablet Take 25 mg by mouth 2 (two) times daily.   Yes [provider]  omeprazole (PRILOSEC) 40 MG capsule Take 40 mg by mouth daily.   Yes [provider]  polyethylene glycol powder (GLYCOLAX/MIRALAX) powder 17 g daily as  needed for constipation. 10/02/17  Yes Cook, Jayce G, DO  rosuvastatin (CRESTOR) 5 MG tablet Take 5 mg by mouth daily.   Yes [provider]  tadalafil (CIALIS) 5 MG tablet Take 5 mg by mouth daily as needed for erectile dysfunction.   Yes [provider]  valACYclovir (VALTREX) 500 MG tablet Take 500 mg by mouth 2 (two) times daily.   Yes [provider]  benzonatate (TESSALON) 200 MG capsule Take one cap TID PRN cough 11/12/18   Lorin Picket, PA-C  chlorpheniramine-HYDROcodone [facility] ER) 10-8 MG/5ML SUER Take 2.5 mLs by mouth 2 (two) times daily. 11/12/18   Lorin Picket, PA-C    Family History Family History  Problem Relation Age of Onset  .  Hypertension Mother   . Heart attack Mother   . Osteoarthritis Mother   . Stroke Father   . Hypertension Father   . Stroke Sister   . Stroke Sister     Social History Social History   Tobacco Use  . Smoking status: Former Smoker    Packs/day: 1.00    Years: 38.00    Pack years: 38.00    Last attempt to quit: 12/1996    Years since quitting: 21.9  . Smokeless tobacco: Never Used  Substance Use Topics  . Alcohol use: Yes    Comment: rarely  . Drug use: No     Allergies   Niacin and related and Statins   Review of Systems Review of Systems  Constitutional: Positive for activity change. Negative for chills, fatigue and fever.  HENT: Positive for congestion and postnasal drip.   Respiratory: Positive for cough, chest tightness and shortness of breath.   All other systems reviewed and are negative.    Physical Exam Triage Vital Signs ED Triage Vitals  Enc Vitals Group     BP 11/12/18 1522 102/75     Pulse Rate 11/12/18 1522 80     Resp 11/12/18 1522 19     Temp 11/12/18 1522 98.3 F (36.8 C)     Temp Source 11/12/18 1522 Oral     SpO2 11/12/18 1522 96 %     Weight 11/12/18 1518 191 lb (86.6 kg)     Height 11/12/18 1518 5' 6.5" (1.689 m)     Head Circumference --      Peak Flow --      Pain Score 11/12/18 1518 8     Pain Loc --      Pain Edu? --      Excl. in [place]? --    No data found.  Updated Vital Signs BP 102/75 (BP Location: Right Arm)   Pulse 80   Temp 98.3 F (36.8 C) (Oral)   Resp 19   Ht 5' 6.5" (1.689 m)   Wt 191 lb (86.6 kg)   SpO2 96%   BMI 30.37 kg/m   Visual Acuity Right Eye Distance:   Left Eye Distance:   Bilateral Distance:    Right Eye Near:   Left Eye Near:    Bilateral Near:     Physical Exam Vitals signs and nursing note reviewed.  Constitutional:      General: He is not in acute distress.    Appearance: Normal appearance. He is not ill-appearing, toxic-appearing or diaphoretic.  HENT:     Head: Normocephalic.      Right Ear: Tympanic membrane and ear canal normal.     Left Ear: Tympanic membrane and ear canal normal.  Nose: Nose normal.     Mouth/Throat:     Mouth: Mucous membranes are moist.     Pharynx: No oropharyngeal exudate or posterior oropharyngeal erythema.  Eyes:     General:        Right eye: No discharge.        Left eye: No discharge.     Conjunctiva/sclera: Conjunctivae normal.  Neck:     Musculoskeletal: Normal range of motion and neck supple.  Pulmonary:     Effort: Pulmonary effort is normal.     Breath sounds: Normal breath sounds.  Musculoskeletal: Normal range of motion.  Skin:    General: Skin is warm and dry.  Neurological:     General: No focal deficit present.     Mental Status: He is alert and oriented to person, place, and time.  Psychiatric:        Mood and Affect: Mood normal.        Behavior: Behavior normal.        Thought Content: Thought content normal.        Judgment: Judgment normal.      UC Treatments / Results  Labs (all labs ordered are listed, but only abnormal results are displayed) Labs Reviewed - No data to display  EKG None  Radiology Dg Chest 2 View  Result Date: 11/12/2018 CLINICAL DATA:  Pt states he has had prod cough with fever, chest soreness, some SOB x 3 days now. Hx pneumonia, bronchitis, exsmoker EXAM: CHEST - 2 VIEW COMPARISON:  12/29/2015 FINDINGS: Stable changes from prior CABG surgery. Cardiac silhouette is normal in size and configuration. No mediastinal or hilar masses or evidence of adenopathy. Clear lungs.  No pleural effusion or pneumothorax. Skeletal structures are intact. IMPRESSION: No active cardiopulmonary disease. Electronically Signed   By: Lajean Manes M.D.   On: 11/12/2018 16:22    Procedures Procedures (including critical care time)  Medications Ordered in UC Medications - No data to display  Initial Impression / Assessment and Plan / UC Course  I have reviewed the triage vital signs and the nursing  notes.  Pertinent labs & imaging results that were available during my care of the patient were reviewed by me and considered in my medical decision making (see chart for details).   I reviewed the x-ray with the patient and his girlfriend.  No evidence of pneumonia is present.  He has a upper respiratory infection mostly viral.  Place him on Tussionex for nighttime use and Tessalon Perles for the daytime.  If he continues to have his symptoms or worsens over the next week or 2 he should return for further evaluation.   Final Clinical Impressions(s) / UC Diagnoses   Final diagnoses:  Upper respiratory tract infection, unspecified type   Discharge Instructions   None    ED Prescriptions    Medication Sig Dispense Auth. Provider   chlorpheniramine-HYDROcodone (TUSSIONEX PENNKINETIC ER) 10-8 MG/5ML SUER Take 2.5 mLs by mouth 2 (two) times daily. 60 mL Crecencio Mc P, PA-C   benzonatate (TESSALON) 200 MG capsule Take one cap TID PRN cough 30 capsule Lorin Picket, PA-C     Controlled Substance Prescriptions [place] Controlled Substance Registry consulted? Not Applicable   Zorawar, [place], PA-C 11/12/18 1649    Lorin Picket, PA-C 11/12/18 1652

## 2018-11-18 ENCOUNTER — Other Ambulatory Visit: Payer: Self-pay

## 2018-11-18 ENCOUNTER — Encounter

## 2018-11-18 ENCOUNTER — Ambulatory Visit (INDEPENDENT_AMBULATORY_CARE_PROVIDER_SITE_OTHER): Payer: Medicare Other | Admitting: Gastroenterology

## 2018-11-18 ENCOUNTER — Encounter: Payer: Self-pay | Admitting: Gastroenterology

## 2018-11-18 VITALS — BP 118/78 | HR 92 | Resp 17 | Ht 66.0 in | Wt 185.6 lb

## 2018-11-18 DIAGNOSIS — D500 Iron deficiency anemia secondary to blood loss (chronic): Secondary | ICD-10-CM | POA: Diagnosis not present

## 2018-11-18 NOTE — Progress Notes (Signed)
Ryan Darby, MD [address]  [place], [place] [address]  Main: [phone]  Fax: [phone]    Gastroenterology Consultation  Referring Provider:     Earlie Server, MD Primary Care Physician:  Gayland Curry, MD Primary Gastroenterologist:  Dr. Cephas Herrera Reason for Consultation:     Iron deficiency anemia        HPI:   Ryan Herrera is a 73 y.o. male referred by Dr. Gayland Curry, MD  for consultation & management of iron deficiency anemia.  Patient has history of coronary disease, underwent cardiac bypass in 2008, has history of chronic mild normocytic anemia, dating back to 04/2014.  Work-up revealed that he has severe iron deficiency.  He is referred to GI by Dr. Tasia Catchings.  Patient has received iron infusions.  He denies melena, hematochezia, rectal bleeding.  He denies any GI symptoms.  He has history of diabetes, hypothyroidism, currently in a clinical trial with antidiabetic medication.  His last hemoglobin A1c was 7.2.  He reports having had a tick bite in August 2019, was diagnosed with [facility] spotted fever.  He lost about 6 to 8 pounds recently due to stomach flu and URI which he has completely recovered at this time. He does not smoke or drink alcohol Patient is followed by [facility] cardiologist every 6 months, has been doing well from cardiac standpoint NSAIDs: None  Antiplts/Anticoagulants/Anti thrombotics: Aspirin 81  GI Procedures: Colonoscopy at [facility] in 2015 Tubular adenoma  Past Medical History:  Diagnosis Date  . Arthritis   . Coronary artery disease   . Diabetes mellitus without complication ([place])   . GERD (gastroesophageal reflux disease)   . Hypertension   . IBS (irritable bowel syndrome)   . Iron deficiency anemia 09/22/2018  . Thyroid disease     Past Surgical History:  Procedure Laterality Date  . BACK SURGERY    . HERNIA REPAIR    . HYDROCELE EXCISION / REPAIR    . JOINT REPLACEMENT    . TONSILLECTOMY       Current Outpatient Medications:  .  alfuzosin (UROXATRAL) 10 MG 24 hr tablet, Take by mouth., Disp: , Rfl:  .  alfuzosin (UROXATRAL) 10 MG 24 hr tablet, , Disp: , Rfl:  .  allopurinol (ZYLOPRIM) 100 MG tablet, Take by mouth., Disp: , Rfl:  .  aspirin EC 81 MG tablet, Take 81 mg by mouth daily., Disp: , Rfl:  .  B-12 MICROLOZENGE 500 MCG SUBL, DISSOLVE 2 TABLETS UNDER THE TONGUE ONCE DAILY, Disp: , Rfl: 11 .  BD PEN NEEDLE NANO U/F 32G X 4 MM MISC, , Disp: , Rfl:  .  ezetimibe (ZETIA) 10 MG tablet, Take 10 mg by mouth daily., Disp: , Rfl:  .  glipiZIDE (GLUCOTROL) 10 MG tablet, Take 10 mg by mouth 2 (two) times daily before a meal., Disp: , Rfl:  .  hydrochlorothiazide (HYDRODIURIL) 25 MG tablet, Take 25 mg by mouth daily., Disp: , Rfl:  .  hydrocortisone-pramoxine (ANALPRAM-HC) 2.5-1 % rectal cream, Place 1 application rectally 3 (three) times daily., Disp: 30 g, Rfl: 0 .  insulin degludec (TRESIBA) 100 UNIT/ML SOPN FlexTouch Pen, Inject into the skin., Disp: , Rfl:  .  JARDIANCE 10 MG TABS tablet, Take 10 mg by mouth every morning., Disp: , Rfl: 3 .  ketoconazole (NIZORAL) 2 % shampoo, , Disp: , Rfl:  .  levothyroxine (SYNTHROID, LEVOTHROID) 88 MCG tablet, , Disp: , Rfl:  .  lisinopril (PRINIVIL,ZESTRIL)  10 MG tablet, Take 10 mg by mouth daily., Disp: , Rfl:  .  MAG64 64 MG TBEC, Take 1 tablet by mouth daily., Disp: , Rfl:  .  magnesium oxide (MAG-OX) 400 MG tablet, Take 400 mg by mouth daily., Disp: , Rfl:  .  metFORMIN (GLUCOPHAGE) 1000 MG tablet, Take 1,000 mg by mouth 2 (two) times daily with a meal., Disp: , Rfl:  .  metoprolol succinate (TOPROL-XL) 25 MG 24 hr tablet, Take 25 mg by mouth 2 (two) times daily., Disp: , Rfl:  .  metoprolol tartrate (LOPRESSOR) 25 MG tablet, , Disp: , Rfl:  .  omeprazole (PRILOSEC) 20 MG capsule, , Disp: , Rfl:  .  ONETOUCH VERIO test strip, USE AS DIRECTED 4 TIMES A DAY, Disp: , Rfl:  .  polyethylene glycol powder (GLYCOLAX/MIRALAX) powder, 17 g  daily as needed for constipation., Disp: 500 g, Rfl: 0 .  tadalafil (CIALIS) 5 MG tablet, Take 5 mg by mouth daily as needed for erectile dysfunction., Disp: , Rfl:  .  valACYclovir (VALTREX) 500 MG tablet, Take 500 mg by mouth 2 (two) times daily., Disp: , Rfl:  .  amoxicillin-clavulanate (AUGMENTIN) 500-125 MG tablet, , Disp: , Rfl:  .  benzonatate (TESSALON) 200 MG capsule, Take one cap TID PRN cough (Patient not taking: Reported on 11/18/2018), Disp: 30 capsule, Rfl: 0 .  chlorpheniramine-HYDROcodone (TUSSIONEX PENNKINETIC ER) 10-8 MG/5ML SUER, Take 2.5 mLs by mouth 2 (two) times daily. (Patient not taking: Reported on 11/18/2018), Disp: 60 mL, Rfl: 0 .  fluorouracil (EFUDEX) 5 % cream, APPLY A THIN LAYER TO TOPS OF EARS TWICE DAILY FOR 2 WEEKS. ENSURE SUN PROTECTION., Disp: , Rfl: 1 .  LEVEMIR FLEXTOUCH 100 UNIT/ML Pen, , Disp: , Rfl:  .  levothyroxine (SYNTHROID, LEVOTHROID) 75 MCG tablet, Take 88 mcg by mouth daily before breakfast. , Disp: , Rfl:  .  mirabegron ER (MYRBETRIQ) 25 MG TB24 tablet, Take by mouth., Disp: , Rfl:  .  omeprazole (PRILOSEC) 40 MG capsule, Take 40 mg by mouth daily., Disp: , Rfl:  .  rosuvastatin (CRESTOR) 5 MG tablet, Take 5 mg by mouth daily., Disp: , Rfl:    Family History  Problem Relation Age of Onset  . Hypertension Mother   . Heart attack Mother   . Osteoarthritis Mother   . Stroke Father   . Hypertension Father   . Stroke Sister   . Stroke Sister      Social History   Tobacco Use  . Smoking status: Former Smoker    Packs/day: 1.00    Years: 38.00    Pack years: 38.00    Last attempt to quit: 12/1996    Years since quitting: 21.9  . Smokeless tobacco: Never Used  Substance Use Topics  . Alcohol use: Yes    Comment: rarely  . Drug use: No    Allergies as of 11/18/2018 - Review Complete 11/18/2018  Allergen Reaction Noted  . Niacin and related Itching 06/21/2015  . Statins Itching 06/21/2015  . Codeine Nausea And Vomiting and Other (See  Comments) 10/16/2018    Review of Systems:    All systems reviewed and negative except where noted in HPI.   Physical Exam:  BP 118/78 (BP Location: Left Arm, Patient Position: Sitting, Cuff Size: Normal)   Pulse 92   Resp 17   Ht 5\' 6"  (1.676 m)   Wt 185 lb 9.6 oz (84.2 kg)   BMI 29.96 kg/m  No LMP for male patient.  General:   Alert,  Well-developed, well-nourished, pleasant and cooperative in NAD Head:  Normocephalic and atraumatic. Eyes:  Sclera clear, no icterus.   Conjunctiva pink. Ears:  Normal auditory acuity. Nose:  No deformity, discharge, or lesions. Mouth:  No deformity or lesions,oropharynx pink & moist. Neck:  Supple; no masses or thyromegaly. Lungs:  Respirations even and unlabored.  Clear throughout to auscultation.   No wheezes, crackles, or rhonchi. No acute distress. Heart:  Regular rate and rhythm; no murmurs, clicks, rubs, or gallops. Abdomen:  Normal bowel sounds. Soft, non-tender and non-distended without masses, hepatosplenomegaly or hernias noted.  No guarding or rebound tenderness.   Rectal: Not performed Msk:  Symmetrical without gross deformities. Good, equal movement & strength bilaterally. Pulses:  Normal pulses noted. Extremities:  No clubbing or edema.  No cyanosis. Neurologic:  Alert and oriented x3;  grossly normal neurologically. Skin:  Intact without significant lesions or rashes. No jaundice. Psych:  Alert and cooperative. Normal mood and affect.  Imaging Studies: Reviewed  Assessment and Plan:   Tashawn Laswell is a pleasant Caucasian 73 y.o. male with history of metabolic syndrome, chronic kidney disease, coronary artery disease status post cardiac bypass in 2008 seen in consultation for iron deficiency anemia.  Currently, he does not have any GI symptoms.  History of tubular adenoma of the colon  Recommend EGD and colonoscopy +/- VCE Follow-up with hematology for iron replacement therapy   Follow up based on the above  work-up   Ryan Darby, MD

## 2018-12-09 ENCOUNTER — Inpatient Hospital Stay: Payer: Medicare Other | Attending: Oncology

## 2018-12-09 DIAGNOSIS — R61 Generalized hyperhidrosis: Secondary | ICD-10-CM | POA: Diagnosis not present

## 2018-12-09 DIAGNOSIS — Z79899 Other long term (current) drug therapy: Secondary | ICD-10-CM | POA: Insufficient documentation

## 2018-12-09 DIAGNOSIS — I9589 Other hypotension: Secondary | ICD-10-CM | POA: Insufficient documentation

## 2018-12-09 DIAGNOSIS — Z87891 Personal history of nicotine dependence: Secondary | ICD-10-CM | POA: Insufficient documentation

## 2018-12-09 DIAGNOSIS — E119 Type 2 diabetes mellitus without complications: Secondary | ICD-10-CM | POA: Diagnosis not present

## 2018-12-09 DIAGNOSIS — D509 Iron deficiency anemia, unspecified: Secondary | ICD-10-CM | POA: Diagnosis present

## 2018-12-09 DIAGNOSIS — Z7982 Long term (current) use of aspirin: Secondary | ICD-10-CM | POA: Insufficient documentation

## 2018-12-09 DIAGNOSIS — Z794 Long term (current) use of insulin: Secondary | ICD-10-CM | POA: Diagnosis not present

## 2018-12-09 LAB — IRON AND TIBC
IRON: 60 ug/dL (ref 45–182)
Saturation Ratios: 24 % (ref 17.9–39.5)
TIBC: 248 ug/dL — ABNORMAL LOW (ref 250–450)
UIBC: 188 ug/dL

## 2018-12-09 LAB — CBC WITH DIFFERENTIAL/PLATELET
Abs Immature Granulocytes: 0.01 10*3/uL (ref 0.00–0.07)
Basophils Absolute: 0 10*3/uL (ref 0.0–0.1)
Basophils Relative: 0 %
Eosinophils Absolute: 0.6 10*3/uL — ABNORMAL HIGH (ref 0.0–0.5)
Eosinophils Relative: 12 %
HCT: 41.8 % (ref 39.0–52.0)
Hemoglobin: 13.9 g/dL (ref 13.0–17.0)
Immature Granulocytes: 0 %
LYMPHS PCT: 26 %
Lymphs Abs: 1.3 10*3/uL (ref 0.7–4.0)
MCH: 29.8 pg (ref 26.0–34.0)
MCHC: 33.3 g/dL (ref 30.0–36.0)
MCV: 89.7 fL (ref 80.0–100.0)
Monocytes Absolute: 0.3 10*3/uL (ref 0.1–1.0)
Monocytes Relative: 6 %
Neutro Abs: 2.8 10*3/uL (ref 1.7–7.7)
Neutrophils Relative %: 56 %
Platelets: 179 10*3/uL (ref 150–400)
RBC: 4.66 MIL/uL (ref 4.22–5.81)
RDW: 16.4 % — ABNORMAL HIGH (ref 11.5–15.5)
WBC: 5.1 10*3/uL (ref 4.0–10.5)
nRBC: 0 % (ref 0.0–0.2)

## 2018-12-09 LAB — FERRITIN: Ferritin: 191 ng/mL (ref 24–336)

## 2018-12-10 ENCOUNTER — Ambulatory Visit: Payer: Medicare Other

## 2018-12-10 ENCOUNTER — Ambulatory Visit: Payer: Medicare Other | Admitting: Oncology

## 2018-12-11 ENCOUNTER — Inpatient Hospital Stay: Payer: Medicare Other

## 2018-12-11 ENCOUNTER — Inpatient Hospital Stay (HOSPITAL_BASED_OUTPATIENT_CLINIC_OR_DEPARTMENT_OTHER): Payer: Medicare Other | Admitting: Oncology

## 2018-12-11 ENCOUNTER — Other Ambulatory Visit: Payer: Self-pay

## 2018-12-11 ENCOUNTER — Encounter: Payer: Self-pay | Admitting: Oncology

## 2018-12-11 VITALS — BP 85/58 | HR 74 | Temp 96.2°F | Resp 18 | Wt 184.3 lb

## 2018-12-11 DIAGNOSIS — Z7982 Long term (current) use of aspirin: Secondary | ICD-10-CM

## 2018-12-11 DIAGNOSIS — R5383 Other fatigue: Secondary | ICD-10-CM

## 2018-12-11 DIAGNOSIS — I9589 Other hypotension: Secondary | ICD-10-CM

## 2018-12-11 DIAGNOSIS — Z79899 Other long term (current) drug therapy: Secondary | ICD-10-CM

## 2018-12-11 DIAGNOSIS — R61 Generalized hyperhidrosis: Secondary | ICD-10-CM | POA: Diagnosis not present

## 2018-12-11 DIAGNOSIS — D509 Iron deficiency anemia, unspecified: Secondary | ICD-10-CM | POA: Diagnosis not present

## 2018-12-11 DIAGNOSIS — Z794 Long term (current) use of insulin: Secondary | ICD-10-CM

## 2018-12-11 DIAGNOSIS — I959 Hypotension, unspecified: Secondary | ICD-10-CM

## 2018-12-11 DIAGNOSIS — E119 Type 2 diabetes mellitus without complications: Secondary | ICD-10-CM

## 2018-12-11 DIAGNOSIS — Z87891 Personal history of nicotine dependence: Secondary | ICD-10-CM

## 2018-12-11 NOTE — Progress Notes (Signed)
Hematology/Oncology follow up note [facility] [phone] [phone]   Patient Care Team: Gayland Curry, MD as PCP - General (Family Medicine)  REFERRING PROVIDER: Gayland Curry, MD CHIEF COMPLAINTS/REASON FOR VISIT:  Evaluation of anemia  HISTORY OF PRESENTING ILLNESS:  Ryan Herrera is a  73 y.o.  male with PMH listed below who was referred to me for evaluation of anemia and hyperhidrosis.  Reviewed patient's recent labs that was done at [facility] office. Labs revealed anemia with hemoglobin of 12.8  Normal wbc and platelet count.   Reviewed patient's previous labs ordered by primary care physician's office, anemia is chronic onset , duration is since at least 2017.No aggravating or improving factors.  Associated signs and symptoms: Patient reports fatigue. Also has SOB with exertion.  Denies weight loss, easy bruising, hematochezia, hemoptysis, hematuria. Context: History of GI bleeding: denies               History of Chronic kidney disease: he has chronic kidney disease, follows up with Dr.Lateef               Last colonoscopy: not done.                History of iron deficiency, he reports not able to tolerate iron tablets.  Also reports sweating a lot, has to change pajama at night.  Blood pressure in the clinic is low at 87/53. Denies any dizziness, light headed.   INTERVAL HISTORY Ryan Herrera is a 73 y.o. male who has above history reviewed by me today presents for follow up visit for management of iron deficiency anemia. Patient has received weekly Feraheme 535m x 2.  Reports feeling better,  He has had chronic low blood pressure. Denies any dizziness.  [facility] for evaluation of hypotension. He feels better after IV fluid and was discharged.  His blood pressure has been chronically low.  Reports to have episodes of vomiting due to being a drug trial for diabetes.  Currently being taken off the drug.  Still feel fatigued.  He has upcoming EGD and colonoscopy scheduled for work up of GI symptoms and Iron deficiency anemia.   Review of Systems  Constitutional: Positive for malaise/fatigue. Negative for chills, fever and weight loss.  HENT: Negative for sore throat.   Eyes: Negative for redness.  Respiratory: Negative for cough, shortness of breath and wheezing.   Cardiovascular: Negative for chest pain, palpitations and leg swelling.  Gastrointestinal: Negative for abdominal pain, blood in stool, nausea and vomiting.  Genitourinary: Negative for dysuria.  Musculoskeletal: Negative for myalgias.  Skin: Negative for rash.  Neurological: Negative for dizziness, tingling and tremors.  Endo/Heme/Allergies: Does not bruise/bleed easily.  Psychiatric/Behavioral: Negative for hallucinations.    MEDICAL HISTORY:  Past Medical History:  Diagnosis Date  . Arthritis   . Coronary artery disease   . [place]   . GERD (gastroesophageal reflux disease)   . Hypertension   . IBS (irritable bowel syndrome)   . Iron deficiency anemia 09/22/2018  . Thyroid disease     SURGICAL HISTORY: Past Surgical History:  Procedure Laterality Date  . BACK SURGERY    . HERNIA REPAIR    . HYDROCELE EXCISION / REPAIR    . JOINT REPLACEMENT    . TONSILLECTOMY      SOCIAL HISTORY: Social History   Socioeconomic History  . Marital status: Single    Spouse name: Not on  file  . Number of children: Not on file  . Years of education: Not on file  . Highest education level: Not on file  Occupational History  . Occupation: retired  Scientific laboratory technician  . Financial resource strain: Not on file  . Food insecurity:    Worry: Not on file    Inability: Not on file  . Transportation needs:    Medical: Not on file    Non-medical: Not on file  Tobacco Use  . Smoking status: Former Smoker    Packs/day: 1.00    Years: 38.00    Pack years:  38.00    Last attempt to quit: 12/1996    Years since quitting: 22.0  . Smokeless tobacco: Never Used  Substance and Sexual Activity  . Alcohol use: Yes    Comment: rarely  . Drug use: No  . Sexual activity: Not on file  Lifestyle  . Physical activity:    Days per week: Not on file    Minutes per session: Not on file  . Stress: Not on file  Relationships  . Social connections:    Talks on phone: Not on file    Gets together: Not on file    Attends religious service: Not on file    Active member of club or organization: Not on file    Attends meetings of clubs or organizations: Not on file    Relationship status: Not on file  . Intimate partner violence:    Fear of current or ex partner: Not on file    Emotionally abused: Not on file    Physically abused: Not on file    Forced sexual activity: Not on file  Other Topics Concern  . Not on file  Social History Narrative  . Not on file    FAMILY HISTORY: Family History  Problem Relation Age of Onset  . Hypertension Mother   . Heart attack Mother   . Osteoarthritis Mother   . Stroke Father   . Hypertension Father   . Stroke Sister   . Stroke Sister     ALLERGIES:  is allergic to niacin and related; statins; and codeine.  MEDICATIONS:  Current Outpatient Medications  Medication Sig Dispense Refill  . alfuzosin (UROXATRAL) 10 MG 24 hr tablet Take by mouth.    Marland Kitchen alfuzosin (UROXATRAL) 10 MG 24 hr tablet     . allopurinol (ZYLOPRIM) 100 MG tablet Take by mouth.    Marland Kitchen amoxicillin-clavulanate (AUGMENTIN) 500-125 MG tablet     . aspirin EC 81 MG tablet Take 81 mg by mouth daily.    . B-12 MICROLOZENGE 500 MCG SUBL DISSOLVE 2 TABLETS UNDER THE TONGUE ONCE DAILY  11  . BD PEN NEEDLE NANO U/F 32G X 4 MM MISC     . benzonatate (TESSALON) 200 MG capsule Take one cap TID PRN cough (Patient not taking: Reported on 11/18/2018) 30 capsule 0  . chlorpheniramine-HYDROcodone (TUSSIONEX PENNKINETIC ER) 10-8 MG/5ML SUER Take 2.5 mLs by  mouth 2 (two) times daily. (Patient not taking: Reported on 11/18/2018) 60 mL 0  . ezetimibe (ZETIA) 10 MG tablet Take 10 mg by mouth daily.    . fluorouracil (EFUDEX) 5 % cream APPLY A THIN LAYER TO TOPS OF EARS TWICE DAILY FOR 2 WEEKS. ENSURE SUN PROTECTION.  1  . glipiZIDE (GLUCOTROL) 10 MG tablet Take 10 mg by mouth 2 (two) times daily before a meal.    . hydrochlorothiazide (HYDRODIURIL) 25 MG tablet Take 25 mg by mouth daily.    Marland Kitchen  hydrocortisone-pramoxine (ANALPRAM-HC) 2.5-1 % rectal cream Place 1 application rectally 3 (three) times daily. 30 g 0  . insulin degludec (TRESIBA) 100 UNIT/ML SOPN FlexTouch Pen Inject into the skin.    Marland Kitchen JARDIANCE 10 MG TABS tablet Take 10 mg by mouth every morning.  3  . ketoconazole (NIZORAL) 2 % shampoo     . LEVEMIR FLEXTOUCH 100 UNIT/ML Pen     . levothyroxine (SYNTHROID, LEVOTHROID) 75 MCG tablet Take 88 mcg by mouth daily before breakfast.     . levothyroxine (SYNTHROID, LEVOTHROID) 88 MCG tablet     . lisinopril (PRINIVIL,ZESTRIL) 10 MG tablet Take 10 mg by mouth daily.    Marland Kitchen MAG64 64 MG TBEC Take 1 tablet by mouth daily.    . magnesium oxide (MAG-OX) 400 MG tablet Take 400 mg by mouth daily.    . metFORMIN (GLUCOPHAGE) 1000 MG tablet Take 1,000 mg by mouth 2 (two) times daily with a meal.    . metoprolol succinate (TOPROL-XL) 25 MG 24 hr tablet Take 25 mg by mouth 2 (two) times daily.    . metoprolol tartrate (LOPRESSOR) 25 MG tablet     . mirabegron ER (MYRBETRIQ) 25 MG TB24 tablet Take by mouth.    Marland Kitchen omeprazole (PRILOSEC) 20 MG capsule     . omeprazole (PRILOSEC) 40 MG capsule Take 40 mg by mouth daily.    Glory Rosebush VERIO test strip USE AS DIRECTED 4 TIMES A DAY    . polyethylene glycol powder (GLYCOLAX/MIRALAX) powder 17 g daily as needed for constipation. 500 g 0  . rosuvastatin (CRESTOR) 5 MG tablet Take 5 mg by mouth daily.    . tadalafil (CIALIS) 5 MG tablet Take 5 mg by mouth daily as needed for erectile dysfunction.    . valACYclovir  (VALTREX) 500 MG tablet Take 500 mg by mouth 2 (two) times daily.     No current facility-administered medications for this visit.      PHYSICAL EXAMINATION: ECOG PERFORMANCE STATUS: 1 - Symptomatic but completely ambulatory Vitals:   12/11/18 1408  BP: (!) 85/58  Pulse: 74  Resp: 18  Temp: (!) 96.2 F (35.7 C)   Filed Weights   12/11/18 1408  Weight: 184 lb 4.8 oz (83.6 kg)    Physical Exam Constitutional:      General: He is not in acute distress. HENT:     Head: Normocephalic and atraumatic.  Eyes:     General: No scleral icterus.    Pupils: Pupils are equal, round, and reactive to light.  Neck:     Musculoskeletal: Normal range of motion and neck supple.  Cardiovascular:     Rate and Rhythm: Normal rate and regular rhythm.     Heart sounds: Normal heart sounds.  Pulmonary:     Effort: Pulmonary effort is normal. No respiratory distress.     Breath sounds: No wheezing.  Abdominal:     General: Bowel sounds are normal. There is no distension.     Palpations: Abdomen is soft. There is no mass.     Tenderness: There is no abdominal tenderness.  Musculoskeletal: Normal range of motion.        General: No deformity.  Skin:    General: Skin is warm and dry.     Findings: No erythema or rash.  Neurological:     Mental Status: He is alert and oriented to person, place, and time.     Cranial Nerves: No cranial nerve deficit.     Coordination: Coordination  normal.  Psychiatric:        Behavior: Behavior normal.        Thought Content: Thought content normal.      LABORATORY DATA:  I have reviewed the data as listed Lab Results  Component Value Date   WBC 5.1 12/09/2018   HGB 13.9 12/09/2018   HCT 41.8 12/09/2018   MCV 89.7 12/09/2018   PLT 179 12/09/2018   Recent Labs    04/17/18 1439 08/06/18 1925  NA 136 138  K 4.3 4.1  CL 102 102  CO2 23 25  GLUCOSE 233* 221*  BUN 23 21  CREATININE 1.27* 1.18  CALCIUM 8.8* 9.3  GFRNONAA 54* 59*  GFRAA >60  >60  PROT 6.9 7.2  ALBUMIN 3.7 3.9  AST 27 25  ALT 20 22  ALKPHOS 51 56  BILITOT 0.6 0.6   Iron/TIBC/Ferritin/ %Sat    Component Value Date/Time   IRON 60 12/09/2018 1457   TIBC 248 (L) 12/09/2018 1457   FERRITIN 191 12/09/2018 1457   IRONPCTSAT 24 12/09/2018 1457    Multiple myeloma panel negative, normal B12, folate, TSH.    ASSESSMENT & PLAN:  1. Iron deficiency anemia, unspecified iron deficiency anemia type   2. Hypotension, unspecified hypotension type   3. Fatigue, unspecified type    #Labs are reviewed and discussed with patient. Iron panel and hemoglobin have both improved.  Hold Feraheme today.   Fatigue, Chronic hypotension, etiology unknown, possibly due to his chronic GI symptoms, or other etiology. Gastroparesis, adrenal insufficiency CT abdomen pelvis w contrast on 08/10/2018 showed no acute process.  I recommend starting checking morning cortisol, TSH, free T4   Orders Placed This Encounter  Procedures  . CBC with Differential/Platelet    Standing Status:   Future    Standing Expiration Date:   12/11/2019  . Ferritin    Standing Status:   Future    Standing Expiration Date:   12/11/2019  . Iron and TIBC    Standing Status:   Future    Standing Expiration Date:   12/11/2019  . Cortisol    Standing Status:   Future    Standing Expiration Date:   12/11/2019  . TSH    Standing Status:   Future    Standing Expiration Date:   12/11/2019  . T4, free    Standing Status:   Future    Standing Expiration Date:   12/11/2019    All questions were answered. The patient knows to call the clinic with any problems questions or concerns.  Return of visit: 6 months unless labs show abnormality.    Check CBC, iron, ferritin prior to the visit.   Earlie Server, MD, PhD Hematology Oncology [facility] at [facility] Pager- [phone] 12/11/2018

## 2018-12-11 NOTE — Progress Notes (Signed)
Patient here for follow up. No medical concerns voiced. Patietn's blood pressure is low, he states that this is his normal blood pressure since he has "lost weight". No headache or dizziness

## 2018-12-14 ENCOUNTER — Encounter: Payer: Self-pay | Admitting: *Deleted

## 2018-12-14 ENCOUNTER — Telehealth: Payer: Self-pay | Admitting: *Deleted

## 2018-12-14 ENCOUNTER — Other Ambulatory Visit: Payer: Self-pay

## 2018-12-14 NOTE — Telephone Encounter (Signed)
-----   Message from Earlie Server, MD sent at 12/11/2018 11:56 PM EST ----- Please contact patient and let him know that I recommend doing additional testing for his blood pressure. Ordered. Cortisol level needs to be done in the morning. Thank you.

## 2018-12-14 NOTE — Telephone Encounter (Signed)
Called pt to notify him of MD request.    Patient states that the appt would have to be after 10am because he does not wake up early.  Told pt that it would have to be done around 8am or the results would not be accurate.  Patient states that his BP has always been high and says that he is on the way to see Endocrinology this morning and he will talk to that MD to see why this lab has never been done prior to now.  Patient states he will call our office back to schedule appt, as this month is not a good month for him.

## 2018-12-15 ENCOUNTER — Encounter: Payer: Self-pay | Admitting: Anesthesiology

## 2018-12-15 ENCOUNTER — Telehealth: Payer: Self-pay

## 2018-12-15 ENCOUNTER — Telehealth: Payer: Self-pay | Admitting: *Deleted

## 2018-12-15 NOTE — Telephone Encounter (Signed)
Ryan Herrera, please call pt to schedule Lab appt

## 2018-12-15 NOTE — Telephone Encounter (Signed)
Patient called very upset that eh received a call that he has to have a procedure done before they will do the procedure Dr Tasia Catchings wants him to have done. He is upset that a Network engineer or nurse is calling him telling him he has to have this done and not why it is to be done. He is requesting that Dr Tasia Catchings call him to explain what is to be done and why after months of no change it needs to be done. HE does not want to hear it from a nurse, he wants the DOCTOR to call him. Please return his call after 1 PM [phone]

## 2018-12-15 NOTE — Telephone Encounter (Signed)
Called patient and spoke with him. He informed me that his colonoscopy/EGD was cancelled because anaesthesiologist wants patient to do the labs first. He says" they are using you as an excuse of not letting me to do the procedure".   He is upset. He says: I have been having low blood pressure for years and never was a problem for me.  I explained to patient that I reviewed his chart and he has slightly elevated TSH, morning cortisol has never been done.  These are the tests I recommend to complete work up his low blood pressure.  It is anesthesiologist's discretion whether they are comfortable with his blood pressure or not.  Finally he is agreeable getting labs done "one day next week".  Team, please schedule him a lab encounter.  Cc Dr.Vanga.

## 2018-12-15 NOTE — Telephone Encounter (Signed)
See separate note.

## 2018-12-15 NOTE — Telephone Encounter (Signed)
Dr.Vanga and Dr. Tasia Catchings,  Received a telephone call from [facility] at [facility] regarding patient.  He needs to have labs completed before the anesthesiologist will consider sedating him for his colon/egd.  This is due to his recent 12/09/18 ER visit.   Procedures are scheduled for 12/23/18.  However when notified of this he said that he was not going to have the labs done until he hears directly from Dr. Tasia Catchings- NOT A NURSE.  He would like to be called back after 1pm today in regards to this (see telephone encounter 12/15/18 10:55am).  Last office visit with Vanga 11/18/18 Last office visit with Dr. Tasia Catchings 12/11/18  Message sent to both of you to make you both aware.  Thank you,  Sharyn Lull

## 2018-12-16 ENCOUNTER — Telehealth: Payer: Self-pay | Admitting: Nurse Practitioner

## 2018-12-16 ENCOUNTER — Telehealth: Payer: Self-pay | Admitting: Gastroenterology

## 2018-12-16 ENCOUNTER — Telehealth: Payer: Self-pay

## 2018-12-16 NOTE — Telephone Encounter (Signed)
Attempted to call the patient on the number listed on his chart.  He did not pick up the phone, the phone was continuously ringing and could not even take the voicemail  Cephas Darby, MD [address]  [place]  [place], [place] [address]  Main: [phone]  Fax: [phone] Pager: [phone]

## 2018-12-16 NOTE — Telephone Encounter (Signed)
-----   Message from Verlon Au, NP sent at 12/16/2018  9:51 AM EDT ----- Juluis Rainier

## 2018-12-16 NOTE — Telephone Encounter (Signed)
Patient called back.  He is frustrated about the blood test that needed to be done for undergoing procedures even though he thinks everything is normal.  He is scheduled to undergo tests on Monday. I tried to explain to him that sometimes anesthesiologists are extra careful given his age and concern for low blood pressure.   He appreciated talking to him  He clearly does not want to schedule any procedures without confirming with him.  Dr. Tasia Catchings will follow-up on the lab results and notify me.  His iron deficiency anemia has currently resolved with iron replacement therapy.  I still recommend that he should undergo EGD and colonoscopy for evaluation of his iron deficiency anemia.  Patient expressed understanding of the plan   Cephas Darby, MD [address]  [place]  [place], [place] [address]  Main: [phone]  Fax: [phone] Pager: [phone]

## 2018-12-16 NOTE — Telephone Encounter (Signed)
Patient LVM with front office asking to cancel his procedures.  His procedures have been canceled with Maudie Mercury at [facility].  Gina at [facility] has been informed as well.  Thanks [employer]

## 2018-12-16 NOTE — Telephone Encounter (Signed)
Patient called clinic attempting to cancel procedure scheduled for next week. I provided him with the number of Dr. Verlin Grills clinic for him to do so. Patient continued to express his frustration regarding the need for the testing and the delay in ordering the testing.   Despite attempting to explain the rationale for the test and the importance of having this evaluated prior to anesthesia, patient did not seem to grasp the gravity of the situation. He expresses that nursing staff should not be calling him but only his doctors. Many options for management discussed.  Over 15 minutes spent on the phone with the patient.   Patient wishes to to put a note in his chart that no provider/office/doctor should ever order a test without his approval.

## 2018-12-16 NOTE — Telephone Encounter (Signed)
PT left vm to cancel his procedure for 12/23/18 until further noticed

## 2018-12-21 ENCOUNTER — Other Ambulatory Visit: Payer: Self-pay

## 2018-12-21 ENCOUNTER — Inpatient Hospital Stay: Payer: Medicare Other

## 2018-12-21 DIAGNOSIS — R5383 Other fatigue: Secondary | ICD-10-CM

## 2018-12-21 DIAGNOSIS — D509 Iron deficiency anemia, unspecified: Secondary | ICD-10-CM

## 2018-12-21 DIAGNOSIS — I959 Hypotension, unspecified: Secondary | ICD-10-CM

## 2018-12-21 LAB — CORTISOL: Cortisol, Plasma: 11.9 ug/dL

## 2018-12-21 LAB — TSH: TSH: 4.124 u[IU]/mL (ref 0.350–4.500)

## 2018-12-21 LAB — T4, FREE: Free T4: 1.01 ng/dL (ref 0.82–1.77)

## 2018-12-23 ENCOUNTER — Ambulatory Visit: Admission: RE | Admit: 2018-12-23 | Payer: Medicare Other | Source: Home / Self Care | Admitting: Gastroenterology

## 2018-12-23 HISTORY — DX: Personal history of poliomyelitis: Z86.12

## 2018-12-23 SURGERY — ESOPHAGOGASTRODUODENOSCOPY (EGD) WITH PROPOFOL
Anesthesia: Choice

## 2019-03-15 ENCOUNTER — Ambulatory Visit: Payer: Medicare Other | Admitting: Oncology

## 2019-03-15 ENCOUNTER — Other Ambulatory Visit: Payer: Medicare Other

## 2019-06-11 NOTE — Progress Notes (Deleted)
Called patient no answer left message  

## 2019-06-15 ENCOUNTER — Inpatient Hospital Stay: Payer: Medicare Other

## 2019-06-15 ENCOUNTER — Inpatient Hospital Stay: Payer: Medicare Other | Admitting: Oncology

## 2019-10-01 IMAGING — CR DG CHEST 2V
2 series · 3 of 3 positions shown · non-contrast
Comparison: 12/29/2015

CLINICAL DATA: Pt states he has had [place] cough with fever, chest
soreness, some SOB x 3 days now. Hx pneumonia, bronchitis, exsmoker

EXAM:
CHEST - 2 VIEW

[Series 1: chest pa · 0.14mm/px · 2 of 2 slices shown]
[im 1/2]
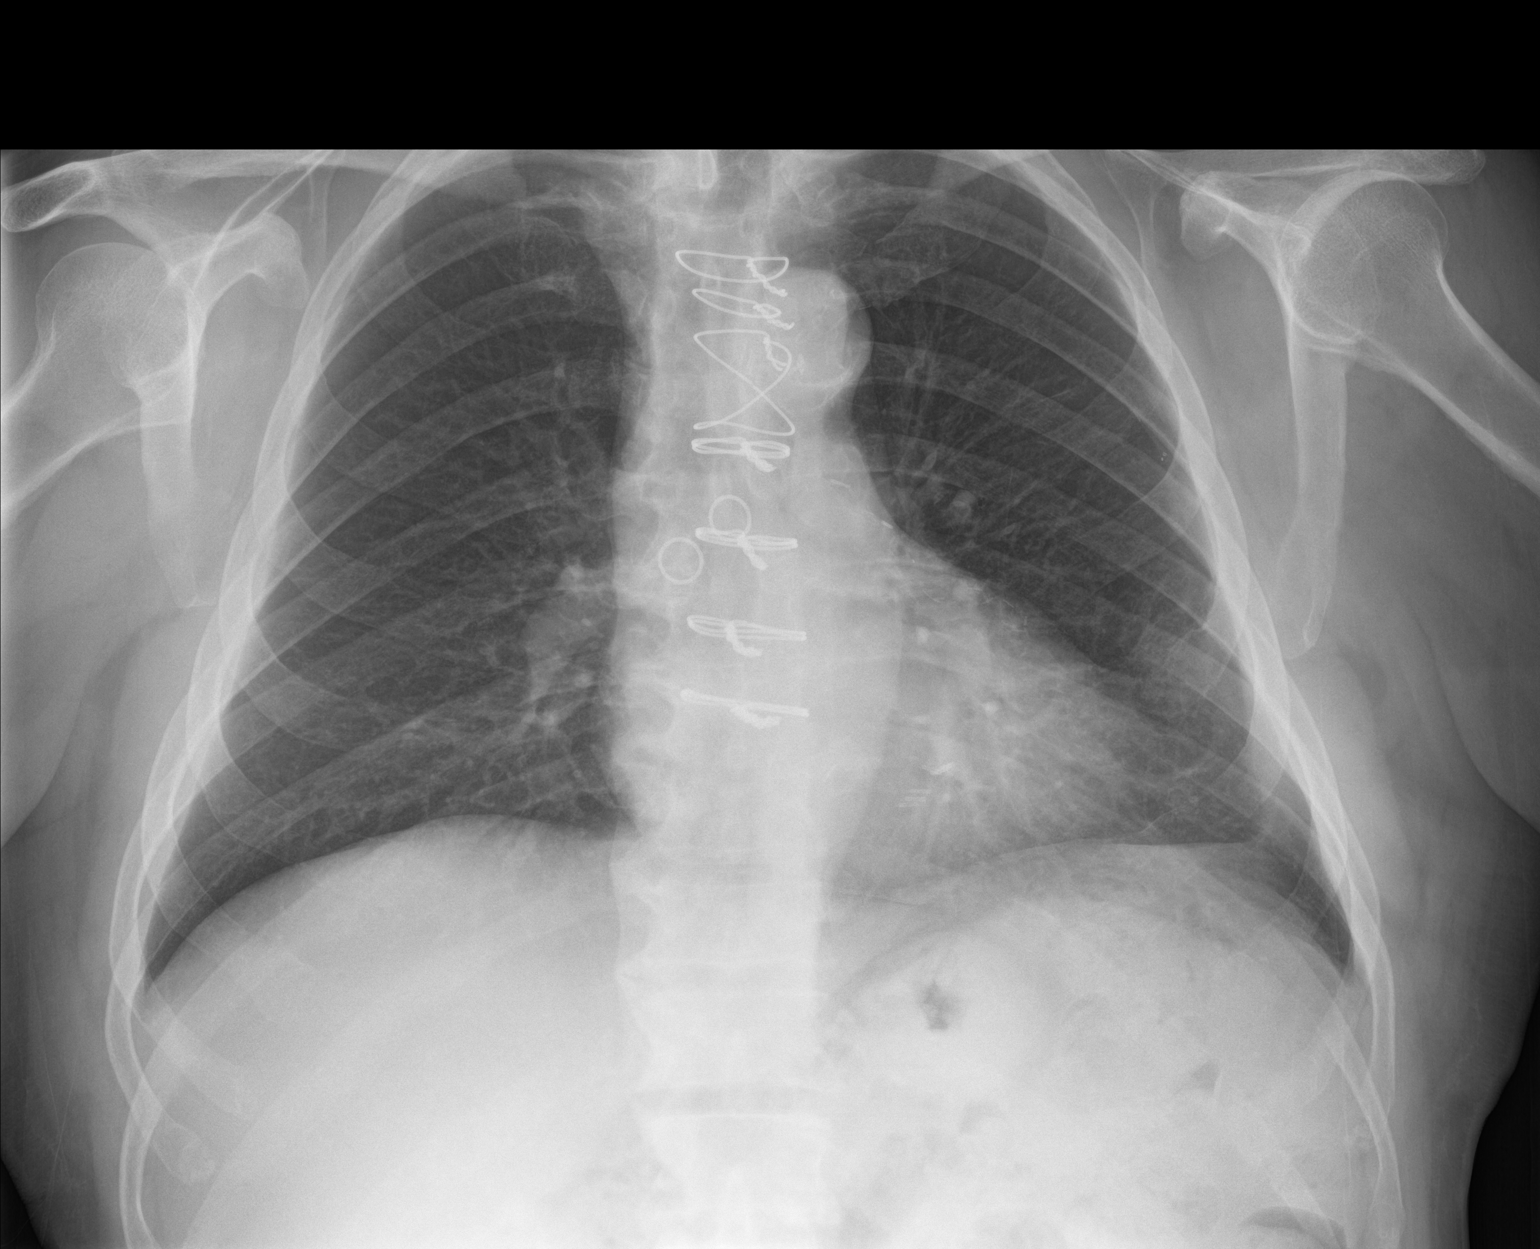
[im 2/2]
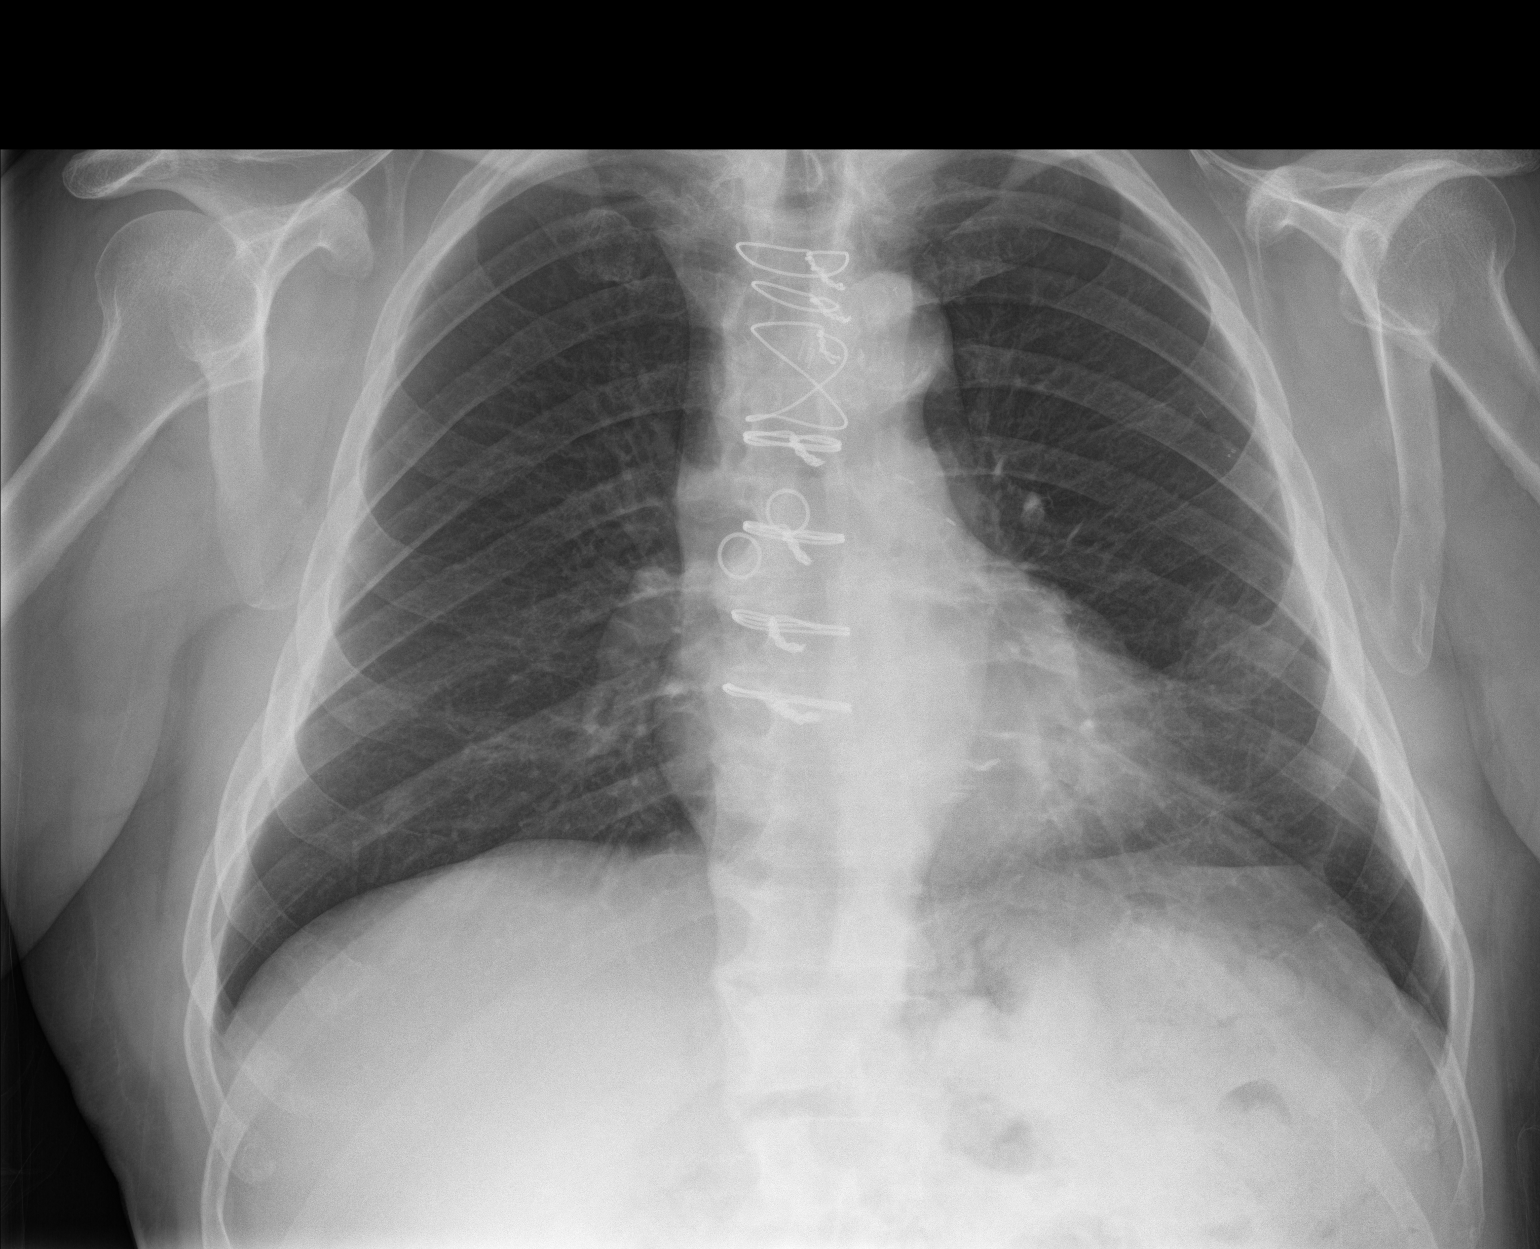

[chest lat]
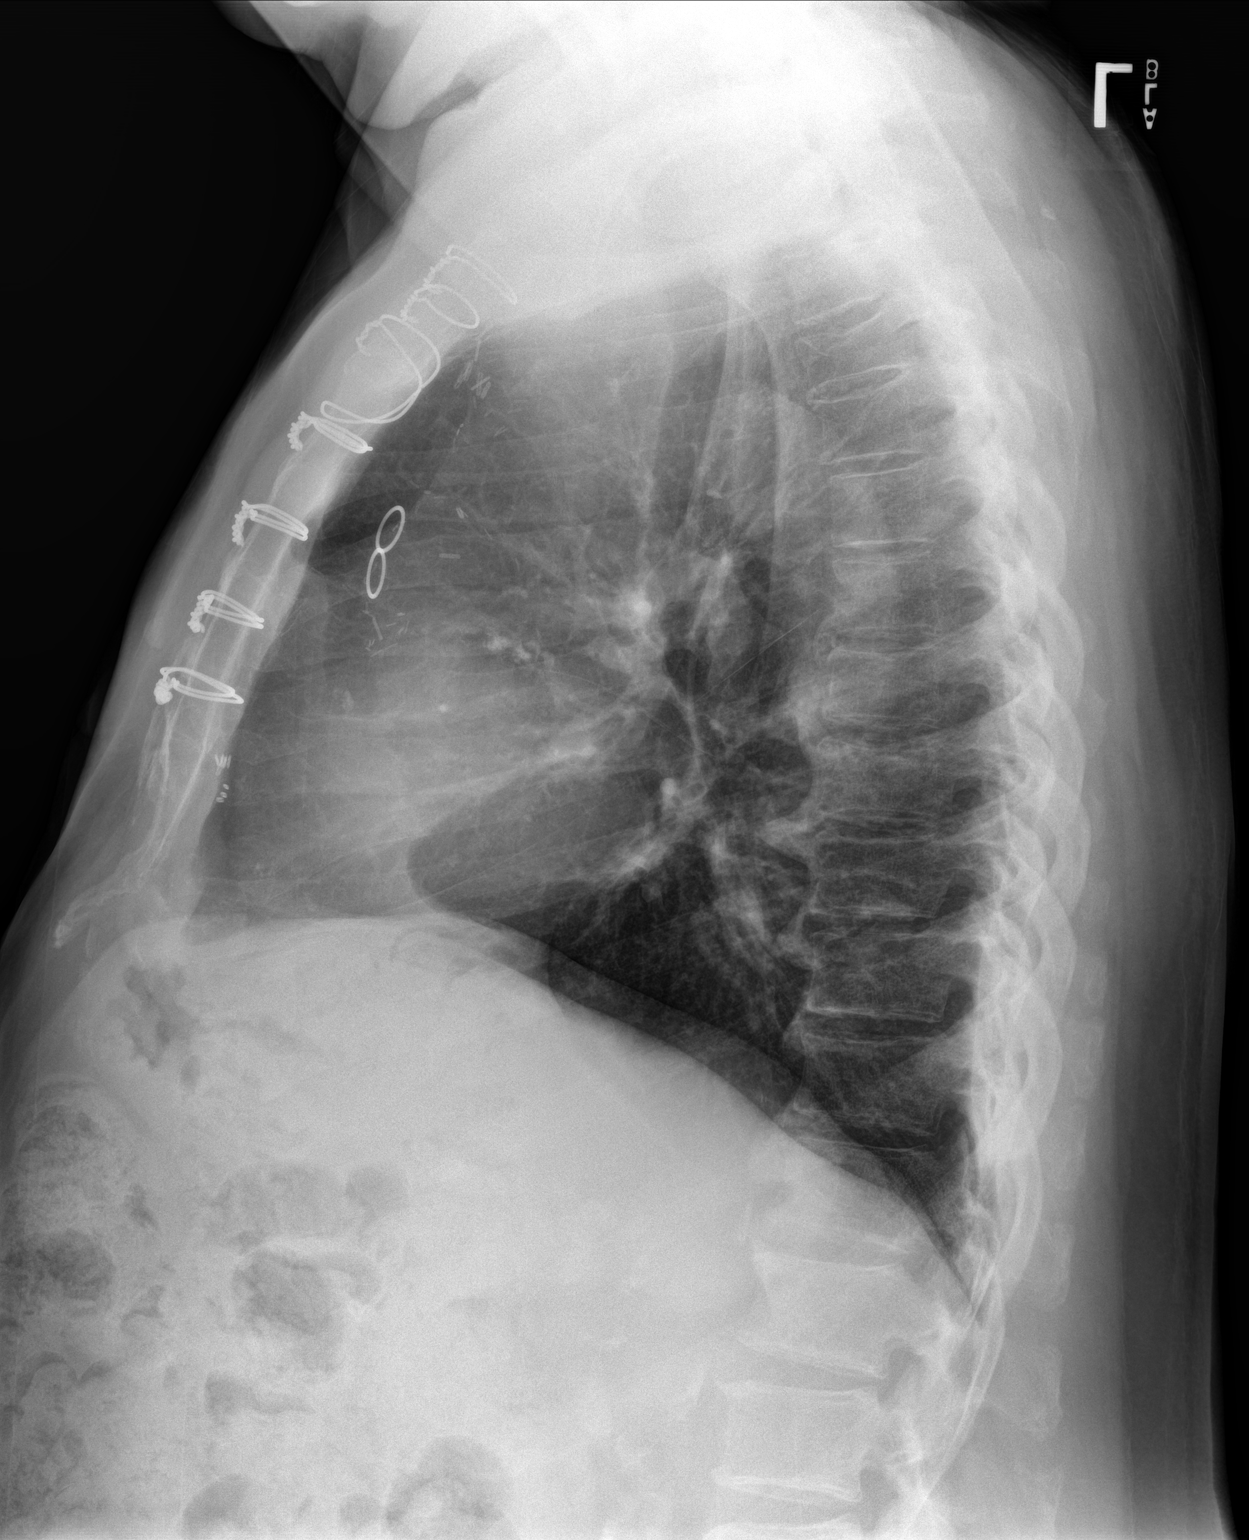

[3 of 3 positions shown; findings below may reference images not displayed]

FINDINGS: Stable changes from prior CABG surgery. Cardiac silhouette is normal
in size and configuration. No mediastinal or hilar masses or
evidence of adenopathy.

Clear lungs.  No pleural effusion or pneumothorax.

Skeletal structures are intact.
IMPRESSION: No active cardiopulmonary disease.

## 2020-01-24 ENCOUNTER — Telehealth: Payer: Self-pay | Admitting: *Deleted

## 2020-01-24 DIAGNOSIS — D509 Iron deficiency anemia, unspecified: Secondary | ICD-10-CM

## 2020-01-24 NOTE — Telephone Encounter (Signed)
Patient called reporting that he was being seen in late 2019 and everything stopped due to [place], He is asking if he needs to start coming back in again and if he still needs to have a colonoscopy done. Please Ryan Herrera

## 2020-01-24 NOTE — Telephone Encounter (Signed)
I am happy to see her for follow up. Please schedule him to have lab - cbc cmp iron tibc ferritin, prior to his visit with me. Thanks.

## 2020-01-25 ENCOUNTER — Telehealth: Payer: Self-pay | Admitting: Oncology

## 2020-01-25 NOTE — Telephone Encounter (Addendum)
Schedule message sent, orders entered in computer

## 2020-01-25 NOTE — Telephone Encounter (Signed)
01/25/20 Left VM for pt, informing him of the appts that have been scheduled per Dr. Tasia Catchings, and if he has any questions, concerns, or needs to r/s, to call back.  SRW

## 2020-01-25 NOTE — Addendum Note (Signed)
Addended by: Betti Cruz on: 01/25/2020 09:08 AM   Modules accepted: Orders

## 2020-01-27 ENCOUNTER — Encounter: Payer: Self-pay | Admitting: *Deleted

## 2020-01-28 ENCOUNTER — Other Ambulatory Visit: Payer: Self-pay

## 2020-01-28 ENCOUNTER — Inpatient Hospital Stay: Payer: Medicare Other | Attending: Oncology

## 2020-01-28 DIAGNOSIS — Z7984 Long term (current) use of oral hypoglycemic drugs: Secondary | ICD-10-CM | POA: Insufficient documentation

## 2020-01-28 DIAGNOSIS — Z79899 Other long term (current) drug therapy: Secondary | ICD-10-CM | POA: Insufficient documentation

## 2020-01-28 DIAGNOSIS — I2510 Atherosclerotic heart disease of native coronary artery without angina pectoris: Secondary | ICD-10-CM | POA: Diagnosis not present

## 2020-01-28 DIAGNOSIS — Z7982 Long term (current) use of aspirin: Secondary | ICD-10-CM | POA: Diagnosis not present

## 2020-01-28 DIAGNOSIS — Z87891 Personal history of nicotine dependence: Secondary | ICD-10-CM | POA: Insufficient documentation

## 2020-01-28 DIAGNOSIS — K589 Irritable bowel syndrome without diarrhea: Secondary | ICD-10-CM | POA: Diagnosis not present

## 2020-01-28 DIAGNOSIS — E1165 Type 2 diabetes mellitus with hyperglycemia: Secondary | ICD-10-CM | POA: Insufficient documentation

## 2020-01-28 DIAGNOSIS — K219 Gastro-esophageal reflux disease without esophagitis: Secondary | ICD-10-CM | POA: Diagnosis not present

## 2020-01-28 DIAGNOSIS — E079 Disorder of thyroid, unspecified: Secondary | ICD-10-CM | POA: Diagnosis not present

## 2020-01-28 DIAGNOSIS — Z8249 Family history of ischemic heart disease and other diseases of the circulatory system: Secondary | ICD-10-CM | POA: Insufficient documentation

## 2020-01-28 DIAGNOSIS — I10 Essential (primary) hypertension: Secondary | ICD-10-CM | POA: Insufficient documentation

## 2020-01-28 DIAGNOSIS — D509 Iron deficiency anemia, unspecified: Secondary | ICD-10-CM | POA: Insufficient documentation

## 2020-01-28 LAB — CBC WITH DIFFERENTIAL/PLATELET
Abs Immature Granulocytes: 0.02 10*3/uL (ref 0.00–0.07)
Basophils Absolute: 0 10*3/uL (ref 0.0–0.1)
Basophils Relative: 1 %
Eosinophils Absolute: 0.2 10*3/uL (ref 0.0–0.5)
Eosinophils Relative: 4 %
HCT: 44.3 % (ref 39.0–52.0)
Hemoglobin: 14.9 g/dL (ref 13.0–17.0)
Immature Granulocytes: 0 %
Lymphocytes Relative: 28 %
Lymphs Abs: 1.4 10*3/uL (ref 0.7–4.0)
MCH: 31.2 pg (ref 26.0–34.0)
MCHC: 33.6 g/dL (ref 30.0–36.0)
MCV: 92.7 fL (ref 80.0–100.0)
Monocytes Absolute: 0.3 10*3/uL (ref 0.1–1.0)
Monocytes Relative: 7 %
Neutro Abs: 3.1 10*3/uL (ref 1.7–7.7)
Neutrophils Relative %: 60 %
Platelets: 177 10*3/uL (ref 150–400)
RBC: 4.78 MIL/uL (ref 4.22–5.81)
RDW: 13.1 % (ref 11.5–15.5)
WBC: 5.1 10*3/uL (ref 4.0–10.5)
nRBC: 0 % (ref 0.0–0.2)

## 2020-01-28 LAB — COMPREHENSIVE METABOLIC PANEL
ALT: 19 U/L (ref 0–44)
AST: 14 U/L — ABNORMAL LOW (ref 15–41)
Albumin: 4 g/dL (ref 3.5–5.0)
Alkaline Phosphatase: 61 U/L (ref 38–126)
Anion gap: 9 (ref 5–15)
BUN: 22 mg/dL (ref 8–23)
CO2: 24 mmol/L (ref 22–32)
Calcium: 9 mg/dL (ref 8.9–10.3)
Chloride: 101 mmol/L (ref 98–111)
Creatinine, Ser: 1.01 mg/dL (ref 0.61–1.24)
GFR calc Af Amer: >60 mL/min (ref >60)
GFR calc non Af Amer: >60 mL/min (ref >60)
Glucose, Bld: 318 mg/dL — ABNORMAL HIGH (ref 70–99)
Potassium: 4.3 mmol/L (ref 3.5–5.1)
Sodium: 134 mmol/L — ABNORMAL LOW (ref 135–145)
Total Bilirubin: 0.6 mg/dL (ref 0.3–1.2)
Total Protein: 7 g/dL (ref 6.5–8.1)

## 2020-01-28 LAB — IRON AND TIBC
Iron: 75 ug/dL (ref 45–182)
Saturation Ratios: 25 % (ref 17.9–39.5)
TIBC: 295 ug/dL (ref 250–450)
UIBC: 220 ug/dL

## 2020-01-28 LAB — FERRITIN: Ferritin: 47 ng/mL (ref 24–336)

## 2020-02-01 ENCOUNTER — Inpatient Hospital Stay (HOSPITAL_BASED_OUTPATIENT_CLINIC_OR_DEPARTMENT_OTHER): Payer: Medicare Other | Admitting: Oncology

## 2020-02-01 ENCOUNTER — Other Ambulatory Visit: Payer: Self-pay

## 2020-02-01 ENCOUNTER — Encounter: Payer: Self-pay | Admitting: Oncology

## 2020-02-01 VITALS — BP 105/69 | HR 85 | Temp 97.6°F | Resp 20 | Wt 200.8 lb

## 2020-02-01 DIAGNOSIS — D509 Iron deficiency anemia, unspecified: Secondary | ICD-10-CM

## 2020-02-01 NOTE — Progress Notes (Signed)
Patient does not offer any problems today.  

## 2020-02-02 NOTE — Progress Notes (Signed)
Hematology/Oncology follow up note [facility] Telephone:([phone] Fax:(336) [phone]   Patient Care Team: Gayland Curry, MD as PCP - General (Family Medicine)  REFERRING PROVIDER: Gayland Curry, MD CHIEF COMPLAINTS/REASON FOR VISIT:  Evaluation of anemia  HISTORY OF PRESENTING ILLNESS:  Ryan Herrera is a  74 y.o.  male with PMH listed below who was referred to me for evaluation of anemia and hyperhidrosis. Patient was found to have iron deficiency anemia andPatient has received weekly Feraheme 522m x 2.     INTERVAL HISTORY Ryan Herrera a 74y.o. male who has above history reviewed by me today presents for follow up visit for management of iron deficiency anemia. Patient feels well.  Denies hematochezia, hematuria, hematemesis, epistaxis, black tarry stool or easy bruising.  He was previously scheduled for EGD and colonoscopy.  Due to COVID-19 pandemic, procedure was delayed.  He has not rescheduled yet. Denies any unintentional weight loss, night sweats, fever or chills.  Review of Systems  Constitutional: Negative for chills, fever, malaise/fatigue and weight loss.  HENT: Negative for sore throat.   Eyes: Negative for redness.  Respiratory: Negative for cough, shortness of breath and wheezing.   Cardiovascular: Negative for chest pain, palpitations and leg swelling.  Gastrointestinal: Negative for abdominal pain, blood in stool, nausea and vomiting.       Occasionally he has stomach discomfort and diarrhea.  Genitourinary: Negative for dysuria.  Musculoskeletal: Negative for myalgias.  Skin: Negative for rash.  Neurological: Negative for dizziness, tingling and tremors.  Endo/Heme/Allergies: Does not bruise/bleed easily.  Psychiatric/Behavioral: Negative for hallucinations.    MEDICAL HISTORY:  Past Medical History:  Diagnosis Date  . Arthritis    hands  . Coronary artery disease   . Diabetes mellitus  without complication ([place]   . GERD (gastroesophageal reflux disease)   . H/O poliomyelitis    age 74  now has "weak stomach muscles"  . Hypertension   . IBS (irritable bowel syndrome)   . Iron deficiency anemia 09/22/2018  . Thyroid disease     SURGICAL HISTORY: Past Surgical History:  Procedure Laterality Date  . BACK SURGERY    . CORONARY ARTERY BYPASS GRAFT  2008   [facility] - 3 vessel  . HERNIA REPAIR    . HYDROCELE EXCISION / REPAIR    . JOINT REPLACEMENT    . TONSILLECTOMY      SOCIAL HISTORY: Social History   Socioeconomic History  . Marital status: Single    Spouse name: Not on file  . Number of children: Not on file  . Years of education: Not on file  . Highest education level: Not on file  Occupational History  . Occupation: retired  Tobacco Use  . Smoking status: Former Smoker    Packs/day: 1.00    Years: 38.00    Pack years: 38.00    Quit date: 12/1996    Years since quitting: 23.1  . Smokeless tobacco: Never Used  Substance and Sexual Activity  . Alcohol use: Yes    Comment: rarely  . Drug use: No  . Sexual activity: Not on file  Other Topics Concern  . Not on file  Social History Narrative  . Not on file   Social Determinants of Health   Financial Resource Strain:   . Difficulty of Paying Living Expenses:   Food Insecurity:   . Worried About RCharity fundraiserin the Last Year:   . [place] the Last Year:  Transportation Needs:   . Film/video editor (Medical):   Marland Kitchen Lack of Transportation (Non-Medical):   Physical Activity:   . Days of Exercise per Week:   . Minutes of Exercise per Session:   Stress:   . Feeling of Stress :   Social Connections:   . Frequency of Communication with Friends and Family:   . Frequency of Social Gatherings with Friends and Family:   . Attends Religious Services:   . Active Member of Clubs or Organizations:   . Attends Archivist Meetings:   Marland Kitchen Marital Status:   Intimate Partner  Violence:   . Fear of Current or Ex-Partner:   . Emotionally Abused:   Marland Kitchen Physically Abused:   . Sexually Abused:     FAMILY HISTORY: Family History  Problem Relation Age of Onset  . Hypertension Mother   . Heart attack Mother   . Osteoarthritis Mother   . Stroke Father   . Hypertension Father   . Stroke Sister   . Stroke Sister     ALLERGIES:  is allergic to mirabegron; niacin and related; statins; and codeine.  MEDICATIONS:  Current Outpatient Medications  Medication Sig Dispense Refill  . allopurinol (ZYLOPRIM) 100 MG tablet Take by mouth.    Marland Kitchen aspirin EC 81 MG tablet Take 81 mg by mouth daily.    . B-12 MICROLOZENGE 500 MCG SUBL DISSOLVE 2 TABLETS UNDER THE TONGUE ONCE DAILY  11  . BD PEN NEEDLE NANO U/F 32G X 4 MM MISC     . Evolocumab (REPATHA SURECLICK) 774 MG/ML SOAJ     . ezetimibe (ZETIA) 10 MG tablet Take 10 mg by mouth daily.    . fluorouracil (EFUDEX) 5 % cream APPLY A THIN LAYER TO TOPS OF EARS TWICE DAILY FOR 2 WEEKS. ENSURE SUN PROTECTION.  1  . glipiZIDE (GLUCOTROL) 10 MG tablet Take 10 mg by mouth 2 (two) times daily before a meal.    . hydrochlorothiazide (HYDRODIURIL) 25 MG tablet Take 25 mg by mouth daily.    . hydrocortisone-pramoxine (ANALPRAM-HC) 2.5-1 % rectal cream Place 1 application rectally 3 (three) times daily. 30 g 0  . JARDIANCE 10 MG TABS tablet Take 10 mg by mouth every morning.  3  . ketoconazole (NIZORAL) 2 % shampoo     . levothyroxine (SYNTHROID, LEVOTHROID) 88 MCG tablet     . lisinopril (PRINIVIL,ZESTRIL) 10 MG tablet Take 10 mg by mouth daily.    Marland Kitchen MAG64 64 MG TBEC Take 1 tablet by mouth daily.    . metFORMIN (GLUCOPHAGE) 1000 MG tablet Take 1,000 mg by mouth 2 (two) times daily with a meal.    . metoprolol succinate (TOPROL-XL) 25 MG 24 hr tablet Take 25 mg by mouth 2 (two) times daily.    Glory Rosebush VERIO test strip USE AS DIRECTED 4 TIMES A DAY    . polyethylene glycol powder (GLYCOLAX/MIRALAX) powder 17 g daily as needed for  constipation. 500 g 0  . tadalafil (CIALIS) 5 MG tablet Take 5 mg by mouth daily as needed for erectile dysfunction.    . valACYclovir (VALTREX) 500 MG tablet Take 500 mg by mouth 2 (two) times daily.    Marland Kitchen alfuzosin (UROXATRAL) 10 MG 24 hr tablet Take by mouth.    Marland Kitchen omeprazole (PRILOSEC) 20 MG capsule     . omeprazole (PRILOSEC) 40 MG capsule Take 40 mg by mouth daily.    . rosuvastatin (CRESTOR) 5 MG tablet Take 5 mg by mouth daily.  No current facility-administered medications for this visit.     PHYSICAL EXAMINATION: ECOG PERFORMANCE STATUS: 1 - Symptomatic but completely ambulatory Vitals:   02/01/20 1506  BP: 105/69  Pulse: 85  Resp: 20  Temp: 97.6 F (36.4 C)   Filed Weights   02/01/20 1506  Weight: 200 lb 12.8 oz (91.1 kg)    Physical Exam Constitutional:      General: He is not in acute distress. HENT:     Head: Normocephalic and atraumatic.  Eyes:     General: No scleral icterus.    Pupils: Pupils are equal, round, and reactive to light.  Cardiovascular:     Rate and Rhythm: Normal rate and regular rhythm.     Heart sounds: Normal heart sounds.  Pulmonary:     Effort: Pulmonary effort is normal. No respiratory distress.     Breath sounds: No wheezing.  Abdominal:     General: Bowel sounds are normal. There is no distension.     Palpations: Abdomen is soft. There is no mass.     Tenderness: There is no abdominal tenderness.  Musculoskeletal:        General: No deformity. Normal range of motion.     Cervical back: Normal range of motion and neck supple.  Skin:    General: Skin is warm and dry.     Findings: No erythema or rash.  Neurological:     Mental Status: He is alert and oriented to person, place, and time. Mental status is at baseline.     Cranial Nerves: No cranial nerve deficit.     Coordination: Coordination normal.  Psychiatric:        Mood and Affect: Mood normal.        Behavior: Behavior normal.        Thought Content: Thought content  normal.      LABORATORY DATA:  I have reviewed the data as listed Lab Results  Component Value Date   WBC 5.1 01/28/2020   HGB 14.9 01/28/2020   HCT 44.3 01/28/2020   MCV 92.7 01/28/2020   PLT 177 01/28/2020   Recent Labs    01/28/20 1532  NA 134*  K 4.3  CL 101  CO2 24  GLUCOSE 318*  BUN 22  CREATININE 1.01  CALCIUM 9.0  GFRNONAA >60  GFRAA >60  PROT 7.0  ALBUMIN 4.0  AST 14*  ALT 19  ALKPHOS 61  BILITOT 0.6   Iron/TIBC/Ferritin/ %Sat    Component Value Date/Time   IRON 75 01/28/2020 1532   TIBC 295 01/28/2020 1532   FERRITIN 47 01/28/2020 1532   IRONPCTSAT 25 01/28/2020 1532    Multiple myeloma panel negative, normal B12, folate, TSH.    ASSESSMENT & PLAN:  1. Iron deficiency anemia, unspecified iron deficiency anemia type    #Labs reviewed and discussed with patient.Iron deficiency has resolved. Ferritin 47, iron saturation 25.  Hemoglobin 14.9. His GI work-up was delayed due to Covid 19 pandemic.  Iron deficiency has resolved and hemoglobin has been stable and a normal.   I encourage patient to discuss with primary care provider regarding GI referral regarding his ongoing chronic abdomen discomfort.  Hyperglycemia, glucose 318.  Suspect some of his abdominal discomfort may be secondary to gastroparesis. Recommend patient to continue follow-up with primary care provider for tightening of glycemic control. Diabetic diet Patient prefers to make another appointment in a year for follow-up his history of iron deficiency anemia.  Orders Placed This Encounter  Procedures  .  CBC with Differential/Platelet    Standing Status:   Future    Standing Expiration Date:   01/31/2021  . Comprehensive metabolic panel    Standing Status:   Future    Standing Expiration Date:   01/31/2021  . Iron and TIBC    Standing Status:   Future    Standing Expiration Date:   01/31/2021  . Ferritin    Standing Status:   Future    Standing Expiration Date:   01/31/2021     All questions were answered. The patient knows to call the clinic with any problems questions or concerns.  Return of visit: 1 year check CBC, CMP iron, ferritin prior to the visit.   Earlie Server, MD, PhD Hematology Oncology [facility] at [facility] Pager- [phone] 02/02/2020

## 2020-05-12 ENCOUNTER — Encounter: Payer: Self-pay | Admitting: Emergency Medicine

## 2020-05-12 ENCOUNTER — Other Ambulatory Visit: Payer: Self-pay

## 2020-05-12 ENCOUNTER — Ambulatory Visit
Admission: EM | Admit: 2020-05-12 | Discharge: 2020-05-12 | Disposition: A | Discharge status: Home / Self Care | Payer: Medicare Other | Attending: Family Medicine | Admitting: Family Medicine

## 2020-05-12 DIAGNOSIS — G5603 Carpal tunnel syndrome, bilateral upper limbs: Secondary | ICD-10-CM | POA: Insufficient documentation

## 2020-05-12 DIAGNOSIS — Z87891 Personal history of nicotine dependence: Secondary | ICD-10-CM | POA: Diagnosis not present

## 2020-05-12 DIAGNOSIS — Z7982 Long term (current) use of aspirin: Secondary | ICD-10-CM | POA: Diagnosis not present

## 2020-05-12 DIAGNOSIS — I2510 Atherosclerotic heart disease of native coronary artery without angina pectoris: Secondary | ICD-10-CM | POA: Insufficient documentation

## 2020-05-12 DIAGNOSIS — Z7984 Long term (current) use of oral hypoglycemic drugs: Secondary | ICD-10-CM | POA: Diagnosis not present

## 2020-05-12 DIAGNOSIS — N529 Male erectile dysfunction, unspecified: Secondary | ICD-10-CM | POA: Diagnosis not present

## 2020-05-12 DIAGNOSIS — Z20822 Contact with and (suspected) exposure to covid-19: Secondary | ICD-10-CM | POA: Diagnosis not present

## 2020-05-12 DIAGNOSIS — E669 Obesity, unspecified: Secondary | ICD-10-CM | POA: Diagnosis not present

## 2020-05-12 DIAGNOSIS — E7849 Other hyperlipidemia: Secondary | ICD-10-CM | POA: Insufficient documentation

## 2020-05-12 DIAGNOSIS — Z79899 Other long term (current) drug therapy: Secondary | ICD-10-CM | POA: Diagnosis not present

## 2020-05-12 DIAGNOSIS — R109 Unspecified abdominal pain: Secondary | ICD-10-CM | POA: Insufficient documentation

## 2020-05-12 DIAGNOSIS — E039 Hypothyroidism, unspecified: Secondary | ICD-10-CM | POA: Insufficient documentation

## 2020-05-12 DIAGNOSIS — Z7901 Long term (current) use of anticoagulants: Secondary | ICD-10-CM | POA: Diagnosis not present

## 2020-05-12 DIAGNOSIS — I129 Hypertensive chronic kidney disease with stage 1 through stage 4 chronic kidney disease, or unspecified chronic kidney disease: Secondary | ICD-10-CM | POA: Insufficient documentation

## 2020-05-12 DIAGNOSIS — K219 Gastro-esophageal reflux disease without esophagitis: Secondary | ICD-10-CM | POA: Diagnosis not present

## 2020-05-12 DIAGNOSIS — Z6832 Body mass index (BMI) 32.0-32.9, adult: Secondary | ICD-10-CM | POA: Insufficient documentation

## 2020-05-12 DIAGNOSIS — E1122 Type 2 diabetes mellitus with diabetic chronic kidney disease: Secondary | ICD-10-CM | POA: Diagnosis not present

## 2020-05-12 DIAGNOSIS — E1136 Type 2 diabetes mellitus with diabetic cataract: Secondary | ICD-10-CM | POA: Insufficient documentation

## 2020-05-12 DIAGNOSIS — Z951 Presence of aortocoronary bypass graft: Secondary | ICD-10-CM | POA: Insufficient documentation

## 2020-05-12 DIAGNOSIS — K529 Noninfective gastroenteritis and colitis, unspecified: Secondary | ICD-10-CM | POA: Diagnosis not present

## 2020-05-12 LAB — COMPREHENSIVE METABOLIC PANEL
ALT: 20 U/L (ref 0–44)
AST: 19 U/L (ref 15–41)
Albumin: 3.7 g/dL (ref 3.5–5.0)
Alkaline Phosphatase: 54 U/L (ref 38–126)
Anion gap: 9 (ref 5–15)
BUN: 21 mg/dL (ref 8–23)
CO2: 24 mmol/L (ref 22–32)
Calcium: 8.6 mg/dL — ABNORMAL LOW (ref 8.9–10.3)
Chloride: 102 mmol/L (ref 98–111)
Creatinine, Ser: 1.14 mg/dL (ref 0.61–1.24)
GFR calc Af Amer: >60 mL/min (ref >60)
GFR calc non Af Amer: >60 mL/min (ref >60)
Glucose, Bld: 308 mg/dL — ABNORMAL HIGH (ref 70–99)
Potassium: 4.5 mmol/L (ref 3.5–5.1)
Sodium: 135 mmol/L (ref 135–145)
Total Bilirubin: 0.7 mg/dL (ref 0.3–1.2)
Total Protein: 6.9 g/dL (ref 6.5–8.1)

## 2020-05-12 LAB — CBC WITH DIFFERENTIAL/PLATELET
Abs Immature Granulocytes: 0.05 10*3/uL (ref 0.00–0.07)
Basophils Absolute: 0 10*3/uL (ref 0.0–0.1)
Basophils Relative: 0 %
Eosinophils Absolute: 0.2 10*3/uL (ref 0.0–0.5)
Eosinophils Relative: 2 %
HCT: 44.4 % (ref 39.0–52.0)
Hemoglobin: 14.6 g/dL (ref 13.0–17.0)
Immature Granulocytes: 1 %
Lymphocytes Relative: 10 %
Lymphs Abs: 0.9 10*3/uL (ref 0.7–4.0)
MCH: 30.5 pg (ref 26.0–34.0)
MCHC: 32.9 g/dL (ref 30.0–36.0)
MCV: 92.7 fL (ref 80.0–100.0)
Monocytes Absolute: 0.4 10*3/uL (ref 0.1–1.0)
Monocytes Relative: 5 %
Neutro Abs: 7.2 10*3/uL (ref 1.7–7.7)
Neutrophils Relative %: 82 %
Platelets: 210 10*3/uL (ref 150–400)
RBC: 4.79 MIL/uL (ref 4.22–5.81)
RDW: 13.8 % (ref 11.5–15.5)
WBC: 8.8 10*3/uL (ref 4.0–10.5)
nRBC: 0 % (ref 0.0–0.2)

## 2020-05-12 LAB — LIPASE, BLOOD: Lipase: 29 U/L (ref 11–51)

## 2020-05-12 MED ORDER — ONDANSETRON 4 MG PO TBDP
4.0000 mg | ORAL_TABLET | Freq: Three times a day (TID) | ORAL | 0 refills | Status: DC | PRN
Start: 2020-05-12 — End: 2021-09-10

## 2020-05-12 NOTE — ED Provider Notes (Signed)
[facility]    CSN: [id number] Arrival date & time: 05/12/20  1804      History   Chief Complaint Chief Complaint  Patient presents with  . Abdominal Pain  . Diarrhea   HPI  74 year old male presents with abdominal pain, nausea, vomiting, diarrhea.  Started abruptly this morning.  He reports abdominal pain and feeling bloated.  Subsequently developed diarrhea, nausea, vomiting.  Vomiting has now subsided.  He has been able to keep down some ginger ale and some crackers this afternoon.  No fever.  No reported sick contacts.  Possibly related to [place] food last night although no one else is sick.  No relieving factors.   Past Medical History:  Diagnosis Date  . Arthritis    hands  . Coronary artery disease   . Diabetes mellitus without complication ([place])   . GERD (gastroesophageal reflux disease)   . H/O poliomyelitis    age 63.  now has "weak stomach muscles"  . Hypertension   . IBS (irritable bowel syndrome)   . Iron deficiency anemia 09/22/2018  . Thyroid disease     Patient Active Problem List   Diagnosis Date Noted  . Age-related nuclear cataract of both eyes 10/16/2018  . Iron deficiency anemia 09/22/2018  . History of poliomyelitis 12/10/2017  . Penile lesion 10/15/2017  . Hyperopia with astigmatism and presbyopia, bilateral 04/21/2017  . Diabetes mellitus due to underlying condition without complication, without long-term current use of insulin ([place]) 04/21/2017  . History of chronic kidney disease 02/14/2015  . Familial multiple lipoprotein-type hyperlipidemia 08/18/2014  . Obesity 07/16/2013  . Right inguinal hernia 06/16/2013  . Arthritis of knee 08/12/2012  . Knee joint replaced by other means 08/12/2012  . Cervical spondylosis without myelopathy 07/29/2012  . Osteoarthritis, hand 02/24/2012  . BPH (benign prostatic hyperplasia) 02/11/2012  . ED (erectile dysfunction) 02/11/2012  . CAD (coronary artery disease), native coronary artery  05/28/2011  . Carpal tunnel syndrome, bilateral 05/28/2011  . Chronic low back pain 05/28/2011  . Diabetes mellitus type 2 without retinopathy ([place]) 05/28/2011  . DJD (degenerative joint disease) 05/28/2011  . Dyslipidemia 05/28/2011  . Essential hypertension 05/28/2011  . Lung nodules 05/28/2011  . Hypothyroidism 05/28/2011    Past Surgical History:  Procedure Laterality Date  . BACK SURGERY    . CORONARY ARTERY BYPASS GRAFT  2008   [facility] - 3 vessel  . HERNIA REPAIR    . HYDROCELE EXCISION / REPAIR    . JOINT REPLACEMENT    . TONSILLECTOMY         Home Medications    Prior to Admission medications   Medication Sig Start Date End Date Taking? Authorizing Provider  allopurinol (ZYLOPRIM) 100 MG tablet Take by mouth. 07/20/18  Yes [provider]  aspirin EC 81 MG tablet Take 81 mg by mouth daily.   Yes [provider]  B-12 MICROLOZENGE 500 MCG SUBL DISSOLVE 2 TABLETS UNDER THE TONGUE ONCE DAILY 07/14/18  Yes [provider]  Evolocumab (REPATHA SURECLICK) 419 MG/ML SOAJ  04/20/19  Yes [provider]  ezetimibe (ZETIA) 10 MG tablet Take 10 mg by mouth daily.   Yes [provider]  glipiZIDE (GLUCOTROL) 10 MG tablet Take 10 mg by mouth 2 (two) times daily before a meal.   Yes [provider]  hydrochlorothiazide (HYDRODIURIL) 25 MG tablet Take 25 mg by mouth daily.   Yes [provider]  JARDIANCE 10 MG TABS tablet Take 10 mg by mouth every  morning. 06/30/18  Yes [provider]  levothyroxine (SYNTHROID, LEVOTHROID) 88 MCG tablet  11/10/18  Yes [provider]  lisinopril (PRINIVIL,ZESTRIL) 10 MG tablet Take 10 mg by mouth daily.   Yes [provider]  metFORMIN (GLUCOPHAGE) 1000 MG tablet Take 1,000 mg by mouth 2 (two) times daily with a meal.   Yes [provider]  metoprolol succinate (TOPROL-XL) 25 MG 24 hr tablet Take 25 mg by mouth 2 (two) times daily.   Yes [provider]  omeprazole (PRILOSEC) 20 MG capsule  08/30/18  Yes [provider]  alfuzosin (UROXATRAL) 10 MG 24 hr tablet Take by mouth. 11/03/18   [provider]  BD PEN NEEDLE NANO U/F 32G X 4 MM MISC  08/26/18   [provider]  fluorouracil (EFUDEX) 5 % cream APPLY A THIN LAYER TO TOPS OF EARS TWICE DAILY FOR 2 WEEKS. ENSURE SUN PROTECTION. 06/04/18   [provider]  hydrocortisone-pramoxine [employer]) 2.5-1 % rectal cream Place 1 application rectally 3 (three) times daily. 10/02/17   Coral Spikes, DO  ketoconazole (NIZORAL) 2 % shampoo  11/01/18   [provider]  MAG64 64 MG TBEC Take 1 tablet by mouth daily. 09/11/18   [provider]  omeprazole (PRILOSEC) 40 MG capsule Take 40 mg by mouth daily.    [provider]  ondansetron (ZOFRAN ODT) 4 MG disintegrating tablet Take 1 tablet (4 mg total) by mouth every 8 (eight) hours as needed for nausea or vomiting. 05/12/20   Coral Spikes, DO  ONETOUCH VERIO test strip USE AS DIRECTED 4 TIMES A DAY 09/22/18   [provider]  polyethylene glycol powder (GLYCOLAX/MIRALAX) powder 17 g daily as needed for constipation. 10/02/17   Coral Spikes, DO  tadalafil (CIALIS) 5 MG tablet Take 5 mg by mouth daily as needed for erectile dysfunction.    [provider]  valACYclovir (VALTREX) 500 MG tablet Take 500 mg by mouth 2 (two) times daily.    [provider]  rosuvastatin (CRESTOR) 5 MG tablet Take 5 mg by mouth daily.  05/12/20  [provider]    Family History Family History  Problem Relation Age of Onset  . Hypertension Mother   . Heart attack Mother   . Osteoarthritis Mother   . Stroke Father   . Hypertension Father   . Stroke Sister   . Stroke Sister     Social History Social History   Tobacco Use  . Smoking status: Former Smoker    Packs/day: 1.00    Years: 38.00    Pack years: 38.00    Quit date: 12/1996    Years since quitting: 23.4  .  Smokeless tobacco: Never Used  Vaping Use  . Vaping Use: Never used  Substance Use Topics  . Alcohol use: Yes    Comment: rarely  . Drug use: No     Allergies   Mirabegron, Niacin and related, Statins, and Codeine   Review of Systems Review of Systems  Constitutional: Positive for appetite change. Negative for fever.  Gastrointestinal: Positive for abdominal pain, diarrhea, nausea and vomiting.   Physical Exam Triage Vital Signs ED Triage Vitals [05/12/20 1821]  Enc Vitals Group     BP 136/83     Pulse Rate 84     Resp 16     Temp 98.5 F (36.9 C)     Temp Source Oral     SpO2 98 %  Weight 200 lb (90.7 kg)     Height 5\' 6"  (1.676 m)     Head Circumference      Peak Flow      Pain Score 4     Pain Loc      Pain Edu?      Excl. in [place]?    Updated Vital Signs BP 136/83 (BP Location: Left Arm)   Pulse 84   Temp 98.5 F (36.9 C) (Oral)   Resp 16   Ht 5\' 6"  (1.676 m)   Wt 90.7 kg   SpO2 98%   BMI 32.28 kg/m   Visual Acuity Right Eye Distance:   Left Eye Distance:   Bilateral Distance:    Right Eye Near:   Left Eye Near:    Bilateral Near:     Physical Exam Vitals and nursing note reviewed.  Constitutional:      General: He is not in acute distress.    Comments: Appears fatigued.  HENT:     Head: Normocephalic and atraumatic.     Mouth/Throat:     Mouth: Mucous membranes are moist.     Pharynx: Oropharynx is clear.  Eyes:     General:        Right eye: No discharge.        Left eye: No discharge.     Conjunctiva/sclera: Conjunctivae normal.  Cardiovascular:     Rate and Rhythm: Normal rate and regular rhythm.  Pulmonary:     Effort: Pulmonary effort is normal.     Breath sounds: Normal breath sounds. No wheezing, rhonchi or rales.  Abdominal:     Palpations: Abdomen is soft.     Tenderness: There is no abdominal tenderness.  Neurological:     Mental Status: He is alert.  Psychiatric:        Mood and Affect: Mood normal.         Behavior: Behavior normal.    UC Treatments / Results  Labs (all labs ordered are listed, but only abnormal results are displayed) Labs Reviewed  COMPREHENSIVE METABOLIC PANEL - Abnormal; Notable for the following components:      Result Value   Glucose, Bld 308 (*)    Calcium 8.6 (*)    All other components within normal limits  SARS CORONAVIRUS 2 (TAT 6-24 HRS)  CBC WITH DIFFERENTIAL/PLATELET  LIPASE, BLOOD    EKG   Radiology No results found.  Procedures Procedures (including critical care time)  Medications Ordered in UC Medications - No data to display  Initial Impression / Assessment and Plan / UC Course  I have reviewed the triage vital signs and the nursing notes.  Pertinent labs & imaging results that were available during my care of the patient were reviewed by me and considered in my medical decision making (see chart for details).    74 year old male presents with suspected gastroenteritis.  Labs notable for elevated glucose.  Creatinine normal at 1.14.  No leukocytosis.  Patient did well in the clinic.  No additional bouts of vomiting or diarrhea.  Sending home on Zofran.  Push fluids.  Supportive care.  Final Clinical Impressions(s) / UC Diagnoses   Final diagnoses:  Gastroenteritis     Discharge Instructions     Lots of fluids.  Medication as directed.  Take care  Dr. Lacinda Axon    ED Prescriptions    Medication Sig Dispense Auth. Provider   ondansetron (ZOFRAN ODT) 4 MG disintegrating tablet Take 1 tablet (4 mg total) by  mouth every 8 (eight) hours as needed for nausea or vomiting. 20 tablet Coral Spikes, DO     PDMP not reviewed this encounter.   Coral Spikes, [place] 05/12/20 2046

## 2020-05-12 NOTE — ED Triage Notes (Signed)
Patient c/o abdominal pain, diarrhea and vomiting that started this morning.  Patient states that he was able to keep down ginger ale and soda crackers around 3pm today.

## 2020-05-12 NOTE — Discharge Instructions (Signed)
Lots of fluids.  Medication as directed.  Take care  Dr. Lacinda Axon

## 2020-05-13 LAB — SARS CORONAVIRUS 2 (TAT 6-24 HRS): SARS Coronavirus 2: NEGATIVE

## 2020-09-26 ENCOUNTER — Ambulatory Visit
Admission: EM | Admit: 2020-09-26 | Discharge: 2020-09-26 | Discharge status: Absent without leave | Payer: Medicare Other

## 2020-10-02 ENCOUNTER — Other Ambulatory Visit: Payer: Self-pay

## 2020-10-02 ENCOUNTER — Ambulatory Visit
Admission: RE | Admit: 2020-10-02 | Discharge: 2020-10-02 | Disposition: A | Discharge status: Home / Self Care | Payer: Medicare Other | Attending: Urology | Admitting: Urology

## 2020-10-02 ENCOUNTER — Ambulatory Visit
Admission: RE | Admit: 2020-10-02 | Discharge: 2020-10-02 | Disposition: A | Discharge status: Home / Self Care | Payer: Medicare Other | Source: Ambulatory Visit | Attending: Family Medicine | Admitting: Family Medicine

## 2020-10-02 ENCOUNTER — Other Ambulatory Visit: Payer: Self-pay | Admitting: Urology

## 2020-10-02 DIAGNOSIS — Z01818 Encounter for other preprocedural examination: Secondary | ICD-10-CM | POA: Diagnosis not present

## 2020-10-03 ENCOUNTER — Other Ambulatory Visit: Payer: Self-pay | Admitting: Urology

## 2020-10-03 ENCOUNTER — Ambulatory Visit
Admission: RE | Admit: 2020-10-03 | Discharge: 2020-10-03 | Disposition: A | Discharge status: Home / Self Care | Payer: Medicare Other | Source: Ambulatory Visit | Attending: Urology | Admitting: Urology

## 2020-10-03 DIAGNOSIS — Z01818 Encounter for other preprocedural examination: Secondary | ICD-10-CM

## 2020-12-21 ENCOUNTER — Other Ambulatory Visit: Payer: Self-pay

## 2020-12-21 ENCOUNTER — Encounter: Payer: Self-pay | Admitting: Emergency Medicine

## 2020-12-21 ENCOUNTER — Ambulatory Visit
Admission: EM | Admit: 2020-12-21 | Discharge: 2020-12-21 | Disposition: A | Discharge status: Home / Self Care | Payer: Medicare Other | Attending: Family Medicine | Admitting: Family Medicine

## 2020-12-21 DIAGNOSIS — R52 Pain, unspecified: Secondary | ICD-10-CM

## 2020-12-21 DIAGNOSIS — S70362A Insect bite (nonvenomous), left thigh, initial encounter: Secondary | ICD-10-CM

## 2020-12-21 DIAGNOSIS — W57XXXA Bitten or stung by nonvenomous insect and other nonvenomous arthropods, initial encounter: Secondary | ICD-10-CM

## 2020-12-21 DIAGNOSIS — R5383 Other fatigue: Secondary | ICD-10-CM

## 2020-12-21 MED ORDER — DOXYCYCLINE HYCLATE 100 MG PO CAPS
100.0000 mg | ORAL_CAPSULE | Freq: Two times a day (BID) | ORAL | 0 refills | Status: DC
Start: 2020-12-21 — End: 2021-09-10

## 2020-12-21 NOTE — ED Provider Notes (Signed)
[facility]   [id number] 12/21/20 Arrival Time: 1761  ASSESSMENT & PLAN:  1. Fatigue, unspecified type   2. Body aches   3. Tick bite of left thigh, initial encounter     No sign of localized skin infection or abscess. Reports redness at site of bite but this is stable and not worsening; discussed this is likely inflammatory secondary to tick bite.  Given symptoms, will empirically treat with: Meds ordered this encounter  Medications  . doxycycline (VIBRAMYCIN) 100 MG capsule    Sig: Take 1 capsule (100 mg total) by mouth 2 (two) times daily.    Dispense:  28 capsule    Refill:  0     Follow-up Information    Gayland Curry, MD.   Specialty: Family Medicine Why: If worsening or failing to improve as anticipated. Contact information: [place] [address] [phone]               Reviewed expectations re: course of current medical issues. Questions answered. Outlined signs and symptoms indicating need for more acute intervention. Patient verbalized understanding. After Visit Summary given.   SUBJECTIVE: History from: patient. Ryan Herrera is a 75 y.o. male who reports finding and removing a engorged tick from his L inner thigh 2 w ago. Feeling fatigued with body aches and joint aches. Afebrile. No rashes. Does report mild redness at site of bite. No headaches, n/v, visual changes, extremity edema reported. Ambulatory without difficulty. No OTC treatment.   OBJECTIVE:  Vitals:   12/21/20 1349  BP: 111/66  Pulse: 82  Resp: 18  Temp: 98.4 F (36.9 C)  TempSrc: Oral  SpO2: 94%    General appearance: alert; no distress Eyes: PERRLA; EOMI; conjunctiva normal HENT: normocephalic; atraumatic Lungs: unlabored Heart: regular rate and rhythm Extremities: no edema; symmetrical with no gross deformities Skin: warm and dry; very slight approx 0.5 cm induration of L inner thigh; without wheals; normal skin temperature;  no visible foreign bodies or parts of tick appreciated; no sign of infection Neurologic: normal gait; normal symmetric reflexes Psychological: alert and cooperative; normal mood and affect  Investigations Pending: Labs Reviewed  CBC WITH DIFFERENTIAL/PLATELET    Allergies  Allergen Reactions  . Mirabegron Other (See Comments)  . Niacin And Related Itching  . Other     PT does not want OPIOIDS    . Statins Itching    Past Medical History:  Diagnosis Date  . Arthritis    hands  . Coronary artery disease   . Diabetes mellitus without complication ([place])   . GERD (gastroesophageal reflux disease)   . H/O poliomyelitis    age 45.  now has "weak stomach muscles"  . Hypertension   . IBS (irritable bowel syndrome)   . Iron deficiency anemia 09/22/2018  . Thyroid disease    Social History   Socioeconomic History  . Marital status: Single    Spouse name: Not on file  . Number of children: Not on file  . Years of education: Not on file  . Highest education level: Not on file  Occupational History  . Occupation: retired  Tobacco Use  . Smoking status: Former Smoker    Packs/day: 1.00    Years: 38.00    Pack years: 38.00    Quit date: 12/1996    Years since quitting: 24.0  . Smokeless tobacco: Never Used  Vaping Use  . Vaping Use: Never used  Substance and Sexual Activity  . Alcohol use: Yes  Comment: rarely  . Drug use: No  . Sexual activity: Not on file  Other Topics Concern  . Not on file  Social History Narrative  . Not on file   Social Determinants of Health   Financial Resource Strain: Not on file  Food Insecurity: Not on file  Transportation Needs: Not on file  Physical Activity: Not on file  Stress: Not on file  Social Connections: Not on file  Intimate Partner Violence: Not on file   Family History  Problem Relation Age of Onset  . Hypertension Mother   . Heart attack Mother   . Osteoarthritis Mother   . Stroke Father   . Hypertension Father    . Stroke Sister   . Stroke Sister    Past Surgical History:  Procedure Laterality Date  . BACK SURGERY    . CORONARY ARTERY BYPASS GRAFT  2008   [facility] - 3 vessel  . HERNIA REPAIR    . HYDROCELE EXCISION / REPAIR    . JOINT REPLACEMENT    . Evonnie Dawes, MD 12/21/20 [phone]

## 2020-12-21 NOTE — Discharge Instructions (Addendum)
You have had labs (blood work) drawn today. We will call you with any significant abnormalities or if there is need to begin or change treatment or pursue further follow up.  You may also review your test results online through MyChart. If you do not have a MyChart account, instructions to sign up should be on your discharge paperwork.  

## 2020-12-21 NOTE — ED Triage Notes (Signed)
Body aches, joint aches and fatigue x 3 or more weeks.  Reports 3 tick bites around the time he started feeling bad.  2  On upper  LT leg and 1 to LT waist area.

## 2020-12-22 LAB — CBC WITH DIFFERENTIAL/PLATELET
Basophils Absolute: 0.1 10*3/uL (ref 0.0–0.2)
Basos: 1 %
EOS (ABSOLUTE): 0.2 10*3/uL (ref 0.0–0.4)
Eos: 4 %
Hematocrit: 44.2 % (ref 37.5–51.0)
Hemoglobin: 14.6 g/dL (ref 13.0–17.7)
Immature Grans (Abs): 0 10*3/uL (ref 0.0–0.1)
Immature Granulocytes: 0 %
Lymphocytes Absolute: 1.5 10*3/uL (ref 0.7–3.1)
Lymphs: 31 %
MCH: 30.3 pg (ref 26.6–33.0)
MCHC: 33 g/dL (ref 31.5–35.7)
MCV: 92 fL (ref 79–97)
Monocytes Absolute: 0.3 10*3/uL (ref 0.1–0.9)
Monocytes: 6 %
Neutrophils Absolute: 2.9 10*3/uL (ref 1.4–7.0)
Neutrophils: 58 %
Platelets: 203 10*3/uL (ref 150–450)
RBC: 4.82 x10E6/uL (ref 4.14–5.80)
RDW: 13.2 % (ref 11.6–15.4)
WBC: 5 10*3/uL (ref 3.4–10.8)

## 2021-01-05 HISTORY — PX: PROSTATE SURGERY: SHX751

## 2021-01-15 ENCOUNTER — Ambulatory Visit
Admission: EM | Admit: 2021-01-15 | Discharge: 2021-01-15 | Disposition: A | Discharge status: Home / Self Care | Payer: Medicare Other | Attending: Family Medicine | Admitting: Family Medicine

## 2021-01-15 ENCOUNTER — Encounter: Payer: Self-pay | Admitting: Emergency Medicine

## 2021-01-15 ENCOUNTER — Other Ambulatory Visit: Payer: Self-pay

## 2021-01-15 DIAGNOSIS — K649 Unspecified hemorrhoids: Secondary | ICD-10-CM

## 2021-01-15 DIAGNOSIS — M25542 Pain in joints of left hand: Secondary | ICD-10-CM

## 2021-01-15 MED ORDER — HYDROCORTISONE (PERIANAL) 2.5 % EX CREA: 1.0000 "application " | TOPICAL_CREAM | Freq: Two times a day (BID) | CUTANEOUS | 0 refills | Status: DC | PRN

## 2021-01-15 NOTE — ED Provider Notes (Signed)
[facility]    CSN: [id number] Arrival date & time: 01/15/21  1059      History   Chief Complaint Chief Complaint  Patient presents with  . Hand Pain    HPI Ryan Herrera is a 75 y.o. male.   HPI  Patient presents today with recurrent hemorrhoids and constipation since completing doxycycline over one month ago.  Reports hemorrhoids flared over the last few days. Denies rectal bleeding., patient has a PCP but is requesting a referral for chronic hand joint pain as he had a provider in [place] but desires to have a closer provider. Patient was advised prior visit we only perform emergent referrals for cardiac and pulmonary conditions, however we can provide with a list of referrals that likely will require a referral from PCP. He has been obtaining joint injections for chronic arthritis. He is having joint pain swelling of left 4th finger and desires further workup. Past Medical History:  Diagnosis Date  . Arthritis    hands  . Coronary artery disease   . Diabetes mellitus without complication ([place])   . GERD (gastroesophageal reflux disease)   . H/O poliomyelitis    age 82.  now has "weak stomach muscles"  . Hypertension   . IBS (irritable bowel syndrome)   . Iron deficiency anemia 09/22/2018  . Thyroid disease     Patient Active Problem List   Diagnosis Date Noted  . Age-related nuclear cataract of both eyes 10/16/2018  . Iron deficiency anemia 09/22/2018  . History of poliomyelitis 12/10/2017  . Penile lesion 10/15/2017  . Hyperopia with astigmatism and presbyopia, bilateral 04/21/2017  . Diabetes mellitus due to underlying condition without complication, without long-term current use of insulin ([place]) 04/21/2017  . History of chronic kidney disease 02/14/2015  . Familial multiple lipoprotein-type hyperlipidemia 08/18/2014  . Obesity 07/16/2013  . Right inguinal hernia 06/16/2013  . Arthritis of knee 08/12/2012  . Knee joint replaced by other  means 08/12/2012  . Cervical spondylosis without myelopathy 07/29/2012  . Osteoarthritis, hand 02/24/2012  . BPH (benign prostatic hyperplasia) 02/11/2012  . ED (erectile dysfunction) 02/11/2012  . CAD (coronary artery disease), native coronary artery 05/28/2011  . Carpal tunnel syndrome, bilateral 05/28/2011  . Chronic low back pain 05/28/2011  . Diabetes mellitus type 2 without retinopathy ([place]) 05/28/2011  . DJD (degenerative joint disease) 05/28/2011  . Dyslipidemia 05/28/2011  . Essential hypertension 05/28/2011  . Lung nodules 05/28/2011  . Hypothyroidism 05/28/2011    Past Surgical History:  Procedure Laterality Date  . BACK SURGERY    . CORONARY ARTERY BYPASS GRAFT  2008   [facility] - 3 vessel  . HERNIA REPAIR    . HYDROCELE EXCISION / REPAIR    . JOINT REPLACEMENT    . TONSILLECTOMY         Home Medications    Prior to Admission medications   Medication Sig Start Date End Date Taking? Authorizing Provider  hydrocortisone (ANUSOL-HC) 2.5 % rectal cream Place 1 application rectally 2 (two) times daily as needed for hemorrhoids or anal itching. 01/15/21  Yes Scot Jun, FNP  alfuzosin (UROXATRAL) 10 MG 24 hr tablet Take by mouth. 11/03/18   [provider]  allopurinol (ZYLOPRIM) 100 MG tablet Take by mouth. 07/20/18   [provider]  aspirin EC 81 MG tablet Take 81 mg by mouth daily.    [provider]  B-12 MICROLOZENGE 500 MCG SUBL DISSOLVE 2 TABLETS UNDER THE TONGUE ONCE DAILY 07/14/18   [provider]  BD PEN NEEDLE NANO U/F 32G X 4 MM MISC  08/26/18   [provider]  doxycycline (VIBRAMYCIN) 100 MG capsule Take 1 capsule (100 mg total) by mouth 2 (two) times daily. 12/21/20   Vanessa Kick, MD  Evolocumab (REPATHA SURECLICK) 998 MG/ML SOAJ  04/20/19   [provider]  ezetimibe (ZETIA) 10 MG tablet Take 10 mg by mouth daily.    [provider]  fluorouracil (EFUDEX) 5 % cream APPLY A THIN LAYER TO  TOPS OF EARS TWICE DAILY FOR 2 WEEKS. ENSURE SUN PROTECTION. 06/04/18   [provider]  glipiZIDE (GLUCOTROL) 10 MG tablet Take 10 mg by mouth 2 (two) times daily before a meal.    [provider]  hydrochlorothiazide (HYDRODIURIL) 25 MG tablet Take 25 mg by mouth daily.    [provider]  hydrocortisone-pramoxine [facility]) 2.5-1 % rectal cream Place 1 application rectally 3 (three) times daily. 10/02/17   Thersa Salt G, DO  JARDIANCE 10 MG TABS tablet Take 10 mg by mouth every morning. 06/30/18   [provider]  ketoconazole (NIZORAL) 2 % shampoo  11/01/18   [provider]  levothyroxine (SYNTHROID, LEVOTHROID) 88 MCG tablet  11/10/18   [provider]  lisinopril (PRINIVIL,ZESTRIL) 10 MG tablet Take 10 mg by mouth daily.    [provider]  MAG64 64 MG TBEC Take 1 tablet by mouth daily. 09/11/18   [provider]  metFORMIN (GLUCOPHAGE) 1000 MG tablet Take 1,000 mg by mouth 2 (two) times daily with a meal.    [provider]  metoprolol succinate (TOPROL-XL) 25 MG 24 hr tablet Take 25 mg by mouth 2 (two) times daily.    [provider]  omeprazole (PRILOSEC) 20 MG capsule  08/30/18   [provider]  omeprazole (PRILOSEC) 40 MG capsule Take 40 mg by mouth daily.    [provider]  ondansetron (ZOFRAN ODT) 4 MG disintegrating tablet Take 1 tablet (4 mg total) by mouth every 8 (eight) hours as needed for nausea or vomiting. 05/12/20   Coral Spikes, DO  ONETOUCH VERIO test strip USE AS DIRECTED 4 TIMES A DAY 09/22/18   [provider]  polyethylene glycol powder (GLYCOLAX/MIRALAX) powder 17 g daily as needed for constipation. 10/02/17   Coral Spikes, DO  tadalafil (CIALIS) 5 MG tablet Take 5 mg by mouth daily as needed for erectile dysfunction.    [provider]  valACYclovir (VALTREX) 500 MG tablet Take 500 mg by mouth 2 (two) times daily.    [provider]   rosuvastatin (CRESTOR) 5 MG tablet Take 5 mg by mouth daily.  05/12/20  [provider]    Family History Family History  Problem Relation Age of Onset  . Hypertension Mother   . Heart attack Mother   . Osteoarthritis Mother   . Stroke Father   . Hypertension Father   . Stroke Sister   . Stroke Sister     Social History Social History   Tobacco Use  . Smoking status: Former Smoker    Packs/day: 1.00    Years: 38.00    Pack years: 38.00    Quit date: 12/1996    Years since quitting: 24.1  . Smokeless tobacco: Never Used  Vaping Use  . Vaping Use: Never used  Substance Use Topics  . Alcohol use: Yes    Comment: rarely  . Drug use: No     Allergies   Mirabegron,  Niacin and related, Other, and Statins   Review of Systems Review of Systems   Physical Exam Triage Vital Signs ED Triage Vitals  Enc Vitals Group     BP 01/15/21 1149 128/79     Pulse Rate 01/15/21 1149 83     Resp 01/15/21 1149 17     Temp 01/15/21 1149 97.8 F (36.6 C)     Temp Source 01/15/21 1149 Tympanic     SpO2 01/15/21 1149 96 %     Weight --      Height --      Head Circumference --      Peak Flow --      Pain Score 01/15/21 1154 0     Pain Loc --      Pain Edu? --      Excl. in [place]? --    No data found.  Updated Vital Signs BP 128/79 (BP Location: Right Arm)   Pulse 83   Temp 97.8 F (36.6 C) (Tympanic)   Resp 17   SpO2 96%   Visual Acuity Right Eye Distance:   Left Eye Distance:   Bilateral Distance:    Right Eye Near:   Left Eye Near:    Bilateral Near:     Physical Exam General appearance: Alert, Cooperative, no distress Head: Normocephalic, without obvious abnormality, atraumatic Respiratory: Respirations even and unlabored, normal respiratory rate Heart: rate and rhythm normal. No gallop or murmurs noted on exam  Abdomen: BS +, no distention, no rebound tenderness, or no mass Extremities: No gross deformities Skin: Skin color, texture, turgor normal.  No rashes seen  Psych: Appropriate mood and affect. Neurologic: GCS 15,normal coordination, normal gait UC Treatments / Results  Labs (all labs ordered are listed, but only abnormal results are displayed) Labs Reviewed - No data to display  EKG   Radiology No results found.  Procedures Procedures (including critical care time)  Medications Ordered in UC Medications - No data to display  Initial Impression / Assessment and Plan / UC Course  I have reviewed the triage vital signs and the nursing notes.  Pertinent labs & imaging results that were available during my care of the patient were reviewed by me and considered in my medical decision making (see chart for details).    Refilled Anusol for management of hemorrhoids. Encouraged daily fiber supplements and increase Miralax twice per day to thin consistently of stool which is likely worsening hemorrhoidal  tissue inflammation/ Patient given information to call [facility] to request assistance with transferring hand management care. Advised that he may require a referral initiated by PcP for treatment at [facility]. Patient verbalized understanding Final Clinical Impressions(s) / UC Diagnoses   Final diagnoses:  Hemorrhoids, unspecified hemorrhoid type  Arthralgia of left hand     Discharge Instructions     I have included the phone number for you to follow-up [facility] regarding your hand pain. They may require a referral and you will need to obtain any referral from your primary care provider in [place].   For hemorrhoids, apply Anusol cream and recommend increasing Miralax twice per day over the next 3 days and taking a fiber supplement to loosen the consistently of your stools until your hemorrhoids resolve   ED Prescriptions    Medication Sig Dispense Auth. Provider   hydrocortisone (ANUSOL-HC) 2.5 % rectal cream Place 1 application rectally 2 (two) times daily as needed for hemorrhoids or anal itching. 90 g  Scot Jun, FNP  PDMP not reviewed this encounter.   Scot Jun, FNP 01/15/21 [phone]

## 2021-01-15 NOTE — Discharge Instructions (Signed)
I have included the phone number for you to follow-up [facility] regarding your hand pain. They may require a referral and you will need to obtain any referral from your primary care provider in [place].   For hemorrhoids, apply Anusol cream and recommend increasing Miralax twice per day over the next 3 days and taking a fiber supplement to loosen the consistently of your stools until your hemorrhoids resolve

## 2021-01-15 NOTE — ED Triage Notes (Signed)
4th finger on LT hand infection is not any better.  Given abx on previous visit.  Also reports constipation and hemorrhoid.

## 2021-01-29 ENCOUNTER — Inpatient Hospital Stay: Payer: Medicare Other

## 2021-01-31 ENCOUNTER — Inpatient Hospital Stay: Payer: Medicare Other | Admitting: Oncology

## 2021-02-26 ENCOUNTER — Other Ambulatory Visit: Payer: Self-pay

## 2021-02-26 DIAGNOSIS — D509 Iron deficiency anemia, unspecified: Secondary | ICD-10-CM

## 2021-02-27 ENCOUNTER — Inpatient Hospital Stay: Payer: Medicare Other | Attending: Oncology

## 2021-02-27 ENCOUNTER — Inpatient Hospital Stay (HOSPITAL_BASED_OUTPATIENT_CLINIC_OR_DEPARTMENT_OTHER): Payer: Medicare Other | Admitting: Oncology

## 2021-02-27 ENCOUNTER — Encounter: Payer: Self-pay | Admitting: Oncology

## 2021-02-27 DIAGNOSIS — D509 Iron deficiency anemia, unspecified: Secondary | ICD-10-CM | POA: Diagnosis present

## 2021-02-27 DIAGNOSIS — Z79899 Other long term (current) drug therapy: Secondary | ICD-10-CM | POA: Diagnosis not present

## 2021-02-27 LAB — CBC WITH DIFFERENTIAL/PLATELET
Abs Immature Granulocytes: 0.02 10*3/uL (ref 0.00–0.07)
Basophils Absolute: 0 10*3/uL (ref 0.0–0.1)
Basophils Relative: 1 %
Eosinophils Absolute: 0.4 10*3/uL (ref 0.0–0.5)
Eosinophils Relative: 8 %
HCT: 40.2 % (ref 39.0–52.0)
Hemoglobin: 13.7 g/dL (ref 13.0–17.0)
Immature Granulocytes: 0 %
Lymphocytes Relative: 32 %
Lymphs Abs: 1.4 10*3/uL (ref 0.7–4.0)
MCH: 31.1 pg (ref 26.0–34.0)
MCHC: 34.1 g/dL (ref 30.0–36.0)
MCV: 91.2 fL (ref 80.0–100.0)
Monocytes Absolute: 0.4 10*3/uL (ref 0.1–1.0)
Monocytes Relative: 8 %
Neutro Abs: 2.3 10*3/uL (ref 1.7–7.7)
Neutrophils Relative %: 51 %
Platelets: 183 10*3/uL (ref 150–400)
RBC: 4.41 MIL/uL (ref 4.22–5.81)
RDW: 12.8 % (ref 11.5–15.5)
WBC: 4.5 10*3/uL (ref 4.0–10.5)
nRBC: 0 % (ref 0.0–0.2)

## 2021-02-27 LAB — COMPREHENSIVE METABOLIC PANEL
ALT: 19 U/L (ref 0–44)
AST: 18 U/L (ref 15–41)
Albumin: 3.7 g/dL (ref 3.5–5.0)
Alkaline Phosphatase: 55 U/L (ref 38–126)
Anion gap: 11 (ref 5–15)
BUN: 17 mg/dL (ref 8–23)
CO2: 25 mmol/L (ref 22–32)
Calcium: 9 mg/dL (ref 8.9–10.3)
Chloride: 100 mmol/L (ref 98–111)
Creatinine, Ser: 0.97 mg/dL (ref 0.61–1.24)
GFR, Estimated: >60 mL/min (ref >60)
Glucose, Bld: 177 mg/dL — ABNORMAL HIGH (ref 70–99)
Potassium: 3.8 mmol/L (ref 3.5–5.1)
Sodium: 136 mmol/L (ref 135–145)
Total Bilirubin: 0.8 mg/dL (ref 0.3–1.2)
Total Protein: 6.7 g/dL (ref 6.5–8.1)

## 2021-02-27 LAB — FERRITIN: Ferritin: 73 ng/mL (ref 24–336)

## 2021-02-27 LAB — IRON AND TIBC
Iron: 71 ug/dL (ref 45–182)
Saturation Ratios: 24 % (ref 17.9–39.5)
TIBC: 298 ug/dL (ref 250–450)
UIBC: 227 ug/dL

## 2021-02-27 NOTE — Progress Notes (Signed)
Hematology/Oncology follow up note [facility] [phone] [phone]   Patient Care Team: Gayland Curry, MD as PCP - General (Family Medicine)  REFERRING PROVIDER: Gayland Curry, MD CHIEF COMPLAINTS/REASON FOR VISIT:  Iron deficiency anemia  HISTORY OF PRESENTING ILLNESS:  Ryan Herrera is a  75 y.o.  male with PMH listed below who was referred to me for evaluation of anemia and hyperhidrosis. Patient was found to have iron deficiency anemia andPatient has received weekly Feraheme 5101m x 2.   INTERVAL HISTORY WDravon Nottis a 75y.o. male who has above history reviewed by me today presents for follow up visit for management of iron deficiency anemia. Patient feels well.  Denies hematochezia, hematuria, hematemesis, epistaxis, black tarry stool or easy bruising.  01/29/2021 patient [facility]. Intermittent chronic diarrhea patient attributes to IBS.  No new complaints.  Review of Systems  Constitutional: Negative for chills, fever, malaise/fatigue and weight loss.  HENT: Negative for sore throat.   Eyes: Negative for redness.  Respiratory: Negative for cough, shortness of breath and wheezing.   Cardiovascular: Negative for chest pain, palpitations and leg swelling.  Gastrointestinal: Negative for abdominal pain, blood in stool, nausea and vomiting.       Occasionally he has stomach discomfort and diarrhea.  Genitourinary: Negative for dysuria.  Musculoskeletal: Negative for myalgias.  Skin: Negative for rash.  Neurological: Negative for dizziness, tingling and tremors.  Endo/Heme/Allergies: Does not bruise/bleed easily.  Psychiatric/Behavioral: Negative for hallucinations.    MEDICAL HISTORY:  Past Medical History:  Diagnosis Date  . Arthritis    hands  . Coronary artery disease   . [place]   . GERD  (gastroesophageal reflux disease)   . H/O poliomyelitis    age 75  now has "weak stomach muscles"  . Hypertension   . IBS (irritable bowel syndrome)   . Iron deficiency anemia 09/22/2018  . Thyroid disease     SURGICAL HISTORY: Past Surgical History:  Procedure Laterality Date  . BACK SURGERY    . CORONARY ARTERY BYPASS GRAFT  2008   [facility] - 3 vessel  . CYSTOSCOPY WITH INSERTION OF UROLIFT    . HERNIA REPAIR    . HYDROCELE EXCISION / REPAIR    . JOINT REPLACEMENT    . TONSILLECTOMY      SOCIAL HISTORY: Social History   Socioeconomic History  . Marital status: Single    Spouse name: Not on file  . Number of children: Not on file  . Years of education: Not on file  . Highest education level: Not on file  Occupational History  . Occupation: retired  Tobacco Use  . Smoking status: Former Smoker    Packs/day: 1.00    Years: 38.00    Pack years: 38.00    Quit date: 12/1996    Years since quitting: 24.2  . Smokeless tobacco: Never Used  Vaping Use  . Vaping Use: Never used  Substance and Sexual Activity  . Alcohol use: Yes    Comment: rarely  . Drug use: No  . Sexual activity: Not on file  Other Topics Concern  . Not on file  Social History Narrative  . Not on file   Social Determinants of Health   Financial Resource Strain: Not on file  Food Insecurity: Not on file  Transportation Needs: Not on file  Physical Activity: Not on file  Stress: Not on file  Social  Connections: Not on file  Intimate Partner Violence: Not on file    FAMILY HISTORY: Family History  Problem Relation Age of Onset  . Hypertension Mother   . Heart attack Mother   . Osteoarthritis Mother   . Stroke Father   . Hypertension Father   . Stroke Sister   . Stroke Sister     ALLERGIES:  is allergic to mirabegron, niacin and related, other, and statins.  MEDICATIONS:  Current Outpatient Medications  Medication Sig Dispense Refill  . alfuzosin (UROXATRAL) 10 MG 24 hr tablet Take by  mouth.    Marland Kitchen allopurinol (ZYLOPRIM) 100 MG tablet Take by mouth.    Marland Kitchen aspirin EC 81 MG tablet Take 81 mg by mouth daily.    . B-12 MICROLOZENGE 500 MCG SUBL DISSOLVE 2 TABLETS UNDER THE TONGUE ONCE DAILY  11  . BD PEN NEEDLE NANO U/F 32G X 4 MM MISC     . Cholecalciferol 50 MCG (2000 UT) CAPS Take by mouth.    . cycloSPORINE (RESTASIS) 0.05 % ophthalmic emulsion Administer 1 drop to both eyes Two (2) times a day.    . Evolocumab (REPATHA SURECLICK) 601 MG/ML SOAJ     . ezetimibe (ZETIA) 10 MG tablet Take 10 mg by mouth daily.    . fluorouracil (EFUDEX) 5 % cream APPLY A THIN LAYER TO TOPS OF EARS TWICE DAILY FOR 2 WEEKS. ENSURE SUN PROTECTION.  1  . glipiZIDE (GLUCOTROL) 10 MG tablet Take 10 mg by mouth 2 (two) times daily before a meal.    . hydrochlorothiazide (HYDRODIURIL) 25 MG tablet Take 25 mg by mouth daily.    . hydrocortisone (ANUSOL-HC) 2.5 % rectal cream Place 1 application rectally 2 (two) times daily as needed for hemorrhoids or anal itching. 90 g 0  . hydrocortisone-pramoxine (ANALPRAM-HC) 2.5-1 % rectal cream Place 1 application rectally 3 (three) times daily. 30 g 0  . insulin degludec (TRESIBA FLEXTOUCH) 100 UNIT/ML FlexTouch Pen Inject into the skin.    Marland Kitchen JARDIANCE 10 MG TABS tablet Take 10 mg by mouth every morning.  3  . ketoconazole (NIZORAL) 2 % shampoo     . levothyroxine (SYNTHROID, LEVOTHROID) 88 MCG tablet     . losartan (COZAAR) 25 MG tablet Take by mouth.    Marland Kitchen MAG64 64 MG TBEC Take 1 tablet by mouth daily.    . metFORMIN (GLUCOPHAGE) 1000 MG tablet Take 1,000 mg by mouth 2 (two) times daily with a meal.    . metoprolol succinate (TOPROL-XL) 25 MG 24 hr tablet Take 25 mg by mouth 2 (two) times daily.    Marland Kitchen omeprazole (PRILOSEC) 40 MG capsule Take 40 mg by mouth daily.    Glory Rosebush VERIO test strip USE AS DIRECTED 4 TIMES A DAY    . polyethylene glycol powder (GLYCOLAX/MIRALAX) powder 17 g daily as needed for constipation. 500 g 0  . tadalafil (CIALIS) 5 MG tablet  Take 5 mg by mouth daily as needed for erectile dysfunction.    . valACYclovir (VALTREX) 500 MG tablet Take 500 mg by mouth 2 (two) times daily.    Marland Kitchen doxycycline (VIBRAMYCIN) 100 MG capsule Take 1 capsule (100 mg total) by mouth 2 (two) times daily. (Patient not taking: Reported on 02/27/2021) 28 capsule 0  . lisinopril (PRINIVIL,ZESTRIL) 10 MG tablet Take 10 mg by mouth daily. (Patient not taking: Reported on 02/27/2021)    . omeprazole (PRILOSEC) 20 MG capsule  (Patient not taking: Reported on 02/27/2021)    .  ondansetron (ZOFRAN ODT) 4 MG disintegrating tablet Take 1 tablet (4 mg total) by mouth every 8 (eight) hours as needed for nausea or vomiting. (Patient not taking: Reported on 02/27/2021) 20 tablet 0   No current facility-administered medications for this visit.     PHYSICAL EXAMINATION: ECOG PERFORMANCE STATUS: 1 - Symptomatic but completely ambulatory Vital signs in Epic Physical Exam Constitutional:      General: He is not in acute distress. HENT:     Head: Normocephalic and atraumatic.  Eyes:     General: No scleral icterus.    Pupils: Pupils are equal, round, and reactive to light.  Cardiovascular:     Rate and Rhythm: Normal rate and regular rhythm.     Heart sounds: Normal heart sounds.  Pulmonary:     Effort: Pulmonary effort is normal. No respiratory distress.     Breath sounds: No wheezing.  Abdominal:     General: Bowel sounds are normal. There is no distension.     Palpations: Abdomen is soft. There is no mass.     Tenderness: There is no abdominal tenderness.  Musculoskeletal:        General: No deformity. Normal range of motion.     Cervical back: Normal range of motion and neck supple.  Skin:    General: Skin is warm and dry.     Findings: No erythema or rash.  Neurological:     Mental Status: He is alert and oriented to person, place, and time. Mental status is at baseline.     Cranial Nerves: No cranial nerve deficit.     Coordination: Coordination  normal.  Psychiatric:        Mood and Affect: Mood normal.        Behavior: Behavior normal.        Thought Content: Thought content normal.      LABORATORY DATA:  I have reviewed the data as listed Lab Results  Component Value Date   WBC 4.5 02/27/2021   HGB 13.7 02/27/2021   HCT 40.2 02/27/2021   MCV 91.2 02/27/2021   PLT 183 02/27/2021   Recent Labs    05/12/20 1848 02/27/21 1338  NA 135 136  K 4.5 3.8  CL 102 100  CO2 24 25  GLUCOSE 308* 177*  BUN 21 17  CREATININE 1.14 0.97  CALCIUM 8.6* 9.0  GFRNONAA >60 >60  GFRAA >60  --   PROT 6.9 6.7  ALBUMIN 3.7 3.7  AST 19 18  ALT 20 19  ALKPHOS 54 55  BILITOT 0.7 0.8   Iron/TIBC/Ferritin/ %Sat    Component Value Date/Time   IRON 71 02/27/2021 1338   TIBC 298 02/27/2021 1338   FERRITIN 73 02/27/2021 1338   IRONPCTSAT 24 02/27/2021 1338    Multiple myeloma panel negative, normal B12, folate, TSH.    ASSESSMENT & PLAN:  1. Iron deficiency anemia, unspecified iron deficiency anemia type   Labs are reviewed and discussed with patient  His hemoglobin has been stable and normal for the past 2 years. Iron panel also is stable with ferritin 73, iron saturation 24. No need to continue follow-up with me at this point.  Recommend patient to continue follow-up with primary care provider.  He agrees with the plan.   All questions were answered. The patient knows to call the clinic with any problems questions or concerns.   Earlie Server, MD, PhD Hematology Oncology [facility] Pager- [phone] 02/27/2021

## 2021-08-14 LAB — HEMOGLOBIN A1C: Hemoglobin A1C: 8.4

## 2021-09-10 ENCOUNTER — Ambulatory Visit (INDEPENDENT_AMBULATORY_CARE_PROVIDER_SITE_OTHER): Payer: Medicare Other | Admitting: Internal Medicine

## 2021-09-10 ENCOUNTER — Other Ambulatory Visit: Payer: Self-pay

## 2021-09-10 ENCOUNTER — Encounter: Payer: Self-pay | Admitting: Internal Medicine

## 2021-09-10 VITALS — BP 140/80 | HR 77 | Ht 66.0 in | Wt 204.8 lb

## 2021-09-10 DIAGNOSIS — I25810 Atherosclerosis of coronary artery bypass graft(s) without angina pectoris: Secondary | ICD-10-CM

## 2021-09-10 MED ORDER — JARDIANCE 10 MG PO TABS: 10.0000 mg | ORAL_TABLET | Freq: Every morning | ORAL | 3 refills | Status: DC

## 2021-09-10 MED ORDER — HYDROCHLOROTHIAZIDE 25 MG PO TABS: 25.0000 mg | ORAL_TABLET | Freq: Every day | ORAL | 3 refills | Status: DC

## 2021-09-10 MED ORDER — REPATHA SURECLICK 140 MG/ML ~~LOC~~ SOAJ: 140.0000 mg | SUBCUTANEOUS | 3 refills | Status: DC

## 2021-09-10 MED ORDER — METOPROLOL SUCCINATE ER 25 MG PO TB24: 25.0000 mg | ORAL_TABLET | Freq: Two times a day (BID) | ORAL | 3 refills | Status: DC

## 2021-09-10 MED ORDER — EZETIMIBE 10 MG PO TABS: 10.0000 mg | ORAL_TABLET | Freq: Every day | ORAL | 3 refills | Status: DC
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
## 2021-09-10 NOTE — Patient Instructions (Signed)
Medication Instructions:  Cardiac Medications Refilled  *If you need a refill on your cardiac medications before your next appointment, please call your pharmacy*  Follow-Up: At [facility], you and your health needs are our priority.  As part of our continuing mission to provide you with exceptional heart care, we have created designated Provider Care Teams.  These Care Teams include your primary Cardiologist (physician) and Advanced Practice Providers (APPs -  Physician Assistants and Nurse Practitioners) who all work together to provide you with the care you need, when you need it.    Your next appointment:   6 month(s)  The format for your next appointment:   In Person  Provider:   Janina Mayo, MD

## 2021-10-05 ENCOUNTER — Other Ambulatory Visit (HOSPITAL_COMMUNITY): Payer: Self-pay | Admitting: Orthopedic Surgery

## 2021-10-05 DIAGNOSIS — M25562 Pain in left knee: Secondary | ICD-10-CM

## 2021-10-16 ENCOUNTER — Encounter (HOSPITAL_COMMUNITY)
Admission: RE | Admit: 2021-10-16 | Discharge: 2021-10-16 | Disposition: A | Discharge status: Home / Self Care | Payer: Medicare Other | Source: Ambulatory Visit | Attending: Orthopedic Surgery | Admitting: Orthopedic Surgery

## 2021-10-16 ENCOUNTER — Encounter (HOSPITAL_COMMUNITY): Payer: Self-pay

## 2021-10-16 ENCOUNTER — Other Ambulatory Visit: Payer: Self-pay

## 2021-10-16 DIAGNOSIS — M25562 Pain in left knee: Secondary | ICD-10-CM | POA: Insufficient documentation

## 2021-10-16 DIAGNOSIS — G8929 Other chronic pain: Secondary | ICD-10-CM | POA: Insufficient documentation

## 2021-10-16 DIAGNOSIS — Z96652 Presence of left artificial knee joint: Secondary | ICD-10-CM | POA: Diagnosis present

## 2021-10-16 HISTORY — DX: Disorder of kidney and ureter, unspecified: N28.9

## 2021-10-16 MED ORDER — TECHNETIUM TC 99M MEDRONATE IV KIT
20.0000 | PACK | Freq: Once | INTRAVENOUS | Status: AC | PRN
  Administered 2021-10-16: 21 via INTRAVENOUS

## 2021-10-22 ENCOUNTER — Ambulatory Visit (INDEPENDENT_AMBULATORY_CARE_PROVIDER_SITE_OTHER): Payer: Medicare Other | Admitting: Nurse Practitioner

## 2021-10-22 ENCOUNTER — Encounter: Payer: Self-pay | Admitting: Nurse Practitioner

## 2021-10-22 ENCOUNTER — Other Ambulatory Visit: Payer: Self-pay

## 2021-10-22 VITALS — BP 136/77 | HR 83 | Ht 66.0 in | Wt 202.0 lb

## 2021-10-22 DIAGNOSIS — E538 Deficiency of other specified B group vitamins: Secondary | ICD-10-CM

## 2021-10-22 DIAGNOSIS — N4889 Other specified disorders of penis: Secondary | ICD-10-CM

## 2021-10-22 DIAGNOSIS — E039 Hypothyroidism, unspecified: Secondary | ICD-10-CM

## 2021-10-22 DIAGNOSIS — I10 Essential (primary) hypertension: Secondary | ICD-10-CM | POA: Diagnosis not present

## 2021-10-22 DIAGNOSIS — E785 Hyperlipidemia, unspecified: Secondary | ICD-10-CM

## 2021-10-22 DIAGNOSIS — M1710 Unilateral primary osteoarthritis, unspecified knee: Secondary | ICD-10-CM

## 2021-10-22 DIAGNOSIS — K529 Noninfective gastroenteritis and colitis, unspecified: Secondary | ICD-10-CM | POA: Insufficient documentation

## 2021-10-22 DIAGNOSIS — B001 Herpesviral vesicular dermatitis: Secondary | ICD-10-CM

## 2021-10-22 DIAGNOSIS — E089 Diabetes mellitus due to underlying condition without complications: Secondary | ICD-10-CM | POA: Diagnosis not present

## 2021-10-22 DIAGNOSIS — N529 Male erectile dysfunction, unspecified: Secondary | ICD-10-CM

## 2021-10-22 DIAGNOSIS — M109 Gout, unspecified: Secondary | ICD-10-CM

## 2021-10-22 MED ORDER — ALLOPURINOL 100 MG PO TABS: 100.0000 mg | ORAL_TABLET | Freq: Every day | ORAL | 3 refills | Status: AC

## 2021-10-22 NOTE — Assessment & Plan Note (Signed)
Well controlled. Takes valtrex 500mg  BID

## 2021-10-22 NOTE — Assessment & Plan Note (Signed)
On cialis 5mg  PRN. follwed by urology

## 2021-10-22 NOTE — Assessment & Plan Note (Addendum)
Takes synthroid 48mcg daily TSH 4.79 ON 09/28/21 follwed by endo at  [place].

## 2021-10-22 NOTE — Assessment & Plan Note (Signed)
Refilled allopurinol. Condition is stable.

## 2021-10-22 NOTE — Assessment & Plan Note (Signed)
Refer to GI 

## 2021-10-22 NOTE — Assessment & Plan Note (Signed)
Takes repatha 140mg , zetia10mg   . Managed by cardiology.

## 2021-10-22 NOTE — Assessment & Plan Note (Signed)
Take vitamin B 12, 1028mcg daily.

## 2021-10-22 NOTE — Assessment & Plan Note (Addendum)
Glipizide 10mg  BID, metformin 1000mg  BID, Tresiba 32 units, check CBG QAM., jardiance 10 once daily. He has yeast infections with jardiance. Goes to [facility], for diabetic management. He is in a study trial.Staes that  A1C was 8.2 last check , next appointment is in 6 months. Diabetic eye exam was done  last week. Condition managed by endo at [facility].

## 2021-10-22 NOTE — Progress Notes (Signed)
New Patient Office Visit  Subjective:  Patient ID: Ryan Herrera, male    DOB: 06-16-1946  Age: 76 y.o. MRN: [id number]  CC:  Chief Complaint  Patient presents with   New Patient (Initial Visit)    New pt. Previous PCP Gayland Curry in [place]. Discuss GI concerns. Diarrhea occurs almost everyday symptoms have been going on for several months. Rash on penal region due to [place].     HPI Ryan Herrera presents to establish. Getting to see previous PCP is almost impossible due to her schedule. Had some tests done recently would like to know the results.  He is upto date with shingles , flu, covid , pneumonia vaccines  .  Gout> takes allopurinol 112m daily, he has been out meds  Vitamin d deff. Takes voatmin D 2000 units daily  Vitamin B 12 deff. Takes 10012msl daily.  HLD  takes, zetia 1077m repatha 140m32m med managed by cardiology.   Type 2 DM : Glipizide 10mg2m, metformin 1000mg 60m Tresiba 32 units, check CBG QAM., jardiance 10 once daily. He has yeast infections with jardiance. [facility]. He is in a study trial. A1C was 8.2 last check , next appointment is in 6 months. Diabetic exam was last week.   Hypothyroidism.  synthroid 88mcg.56mN. Takes losartan 25mg, H4m25mg dai29mED. Takes cialis 5mg  Feve34mlister. Takes valtrex 500mg daily58meborrheic dermatitis.  Uses ketoconazole 2% shampoo.  He has been having painful irritated  skin around penis  [facility]. He was referred to surgery.he was told that he needs to have his circumcision redone but the surgeon retired. . He has been dealing with this for years. He would like a referral to another urologist. He would like for the problem to be completely taken care of . He has been using OTC Domeboro, it helps his irritation but does not cure it .   Left knee Athritis. Knee replacement has been done twcice,  left knee is hurting , he is seeing otho.   Pt c/o chronic diarrhea, sharp pain in the lower abdomen for over a year now, goes like 3-4 times a day sometimes. Denies bloody stool, abdominal pain,N/V  Pt will have [facility] fax [facility]cornGFR lab results to us.    Past[place] History:  Diagnosis Date   Arthritis    hands   Coronary artery disease    Diabetes mellitus without complication ([name])    GERD (gastroesophageal reflux disease)    H/O poliomyelitis    age 69.  now has41"weak stomach muscles"   Hypertension    IBS (irritable bowel syndrome)    Iron deficiency anemia 09/22/2018   Renal insufficiency    Thyroid disease     Past Surgical History:  Procedure Laterality Date   BACK SURGERY     CORONARY ARTERY BYPASS GRAFT  2008   [facility] - 3 vessel   CYSTOSCOPY WITH INSERTION OF UROLIFT     HERNIA REPAIR     HYDROCELE EXCISION / REPAIR     JOINT REPLACEMENT     TONSILLECTOMY      Family History  Problem Relation Age of Onset   Hypertension Mother    Heart attack Mother    Osteoarthritis Mother    Stroke Father    Hypertension Father    Stroke Sister    Stroke Sister  Social History   Socioeconomic History   Marital status: Single    Spouse name: Not on file   Number of children: Not on file   Years of education: Not on file   Highest education level: Not on file  Occupational History   Occupation: retired  Tobacco Use   Smoking status: Former    Packs/day: 1.00    Years: 38.00    Pack years: 38.00    Types: Cigarettes    Quit date: 12/1996    Years since quitting: 24.8   Smokeless tobacco: Never  Vaping Use   Vaping Use: Never used  Substance and Sexual Activity   Alcohol use: Yes    Comment: rarely   Drug use: No   Sexual activity: Not on file  Other Topics Concern   Not on file  Social History Narrative   Not on file   Social Determinants of Health   Financial Resource Strain: Not on file  Food Insecurity: Not on file  Transportation Needs: Not  on file  Physical Activity: Not on file  Stress: Not on file  Social Connections: Not on file  Intimate Partner Violence: Not on file    ROS Review of Systems  Constitutional: Negative.   Respiratory: Negative.    Cardiovascular: Negative.   Genitourinary:  Positive for penile pain.       Chronic Penile irritation  Musculoskeletal:  Positive for arthralgias.  Psychiatric/Behavioral: Negative.     Objective:   Today's Vitals: BP 136/77 (BP Location: Left Arm, Patient Position: Sitting, Cuff Size: Normal)    Pulse 83    Ht 5' 6"  (1.676 m)    Wt 202 lb (91.6 kg)    SpO2 90%    BMI 32.60 kg/m   Physical Exam Vitals and nursing note reviewed.  Constitutional:      General: He is not in acute distress.    Appearance: He is not ill-appearing, toxic-appearing or diaphoretic.  Cardiovascular:     Rate and Rhythm: Normal rate and regular rhythm.     Pulses: Normal pulses.     Heart sounds: Normal heart sounds. No murmur heard.   No friction rub. No gallop.  Pulmonary:     Effort: Pulmonary effort is normal. No respiratory distress.     Breath sounds: Normal breath sounds. No stridor. No wheezing or rhonchi.  Chest:     Chest wall: No tenderness.  Abdominal:     Palpations: Abdomen is soft.  Musculoskeletal:     Comments: Right knee arthritis, able to ROM of right knee   Neurological:     Mental Status: He is alert.  Psychiatric:        Mood and Affect: Mood normal.        Behavior: Behavior normal.        Thought Content: Thought content normal.        Judgment: Judgment normal.    Assessment & Plan:   Problem List Items Addressed This Visit   None   Outpatient Encounter Medications as of 10/22/2021  Medication Sig   allopurinol (ZYLOPRIM) 100 MG tablet Take by mouth.   aspirin EC 81 MG tablet Take 81 mg by mouth daily.   B-12 MICROLOZENGE 500 MCG SUBL DISSOLVE 2 TABLETS UNDER THE TONGUE ONCE DAILY   BD PEN NEEDLE NANO U/F 32G X 4 MM MISC    Cholecalciferol 50 MCG  (2000 UT) CAPS Take by mouth.   Evolocumab (REPATHA SURECLICK) 287 MG/ML SOAJ Inject 140 mg into  the skin every 14 (fourteen) days.   ezetimibe (ZETIA) 10 MG tablet Take 1 tablet (10 mg total) by mouth daily.   glipiZIDE (GLUCOTROL) 10 MG tablet Take 10 mg by mouth 2 (two) times daily before a meal.   hydrochlorothiazide (HYDRODIURIL) 25 MG tablet Take 1 tablet (25 mg total) by mouth daily.   hydrocortisone (ANUSOL-HC) 2.5 % rectal cream Place 1 application rectally 2 (two) times daily as needed for hemorrhoids or anal itching.   hydrocortisone-pramoxine (ANALPRAM-HC) 2.5-1 % rectal cream Place 1 application rectally 3 (three) times daily.   insulin degludec (TRESIBA FLEXTOUCH) 100 UNIT/ML FlexTouch Pen Inject into the skin.   JARDIANCE 10 MG TABS tablet Take 1 tablet (10 mg total) by mouth every morning.   ketoconazole (NIZORAL) 2 % shampoo    levothyroxine (SYNTHROID, LEVOTHROID) 88 MCG tablet    losartan (COZAAR) 25 MG tablet Take by mouth.   MAG64 64 MG TBEC Take 1 tablet by mouth daily.   metFORMIN (GLUCOPHAGE) 1000 MG tablet Take 1,000 mg by mouth 2 (two) times daily with a meal.   omeprazole (PRILOSEC) 40 MG capsule Take 40 mg by mouth daily.   ONETOUCH VERIO test strip USE AS DIRECTED 4 TIMES A DAY   tadalafil (CIALIS) 5 MG tablet Take 5 mg by mouth daily as needed for erectile dysfunction.   valACYclovir (VALTREX) 500 MG tablet Take 500 mg by mouth 2 (two) times daily.   [DISCONTINUED] alfuzosin (UROXATRAL) 10 MG 24 hr tablet Take by mouth. (Patient not taking: Reported on 09/10/2021)   [DISCONTINUED] fluorouracil (EFUDEX) 5 % cream APPLY A THIN LAYER TO TOPS OF EARS TWICE DAILY FOR 2 WEEKS. ENSURE SUN PROTECTION.   [DISCONTINUED] polyethylene glycol powder (GLYCOLAX/MIRALAX) powder 17 g daily as needed for constipation.   [DISCONTINUED] rosuvastatin (CRESTOR) 5 MG tablet Take 5 mg by mouth daily.   No facility-administered encounter medications on file as of 10/22/2021.     Follow-up: No follow-ups on file.   Renee Rival, FNP

## 2021-10-22 NOTE — Assessment & Plan Note (Signed)
DASH diet and commitment to daily physical activity for a minimum of 30 minutes discussed and encouraged, as a part of hypertension management. The importance of attaining a healthy weight is also discussed.  BP/Weight 10/22/2021 09/10/2021 01/15/2021 12/21/2020 05/12/2020 02/01/2020 11/25/4710  Systolic BP 527 129 290 903 014 996 -  Diastolic BP 77 80 79 66 83 69 -  Wt. (Lbs) 202 204.8 - - 200 200.8 -  BMI 32.6 33.06 - - 32.28 32.41 29.7

## 2021-10-22 NOTE — Assessment & Plan Note (Signed)
followed by otho

## 2021-10-22 NOTE — Assessment & Plan Note (Signed)
He has been having painful irritated  skin around penis  he has seen urology at [facility], but no one is doing nothing about, it. He was referred to surgery.he was told that he needs to have his circumcision redone but the surgeon retired. . He has been dealing with this for years. He would like a referral to another urologist. He would like for the problem to be completely taken care of . He has been using OTC Domeboro, it helps his irritation but does not cure it .

## 2021-10-22 NOTE — Patient Instructions (Addendum)
Please get [facility] to fax results to [place]   It is important that you exercise regularly at least 30 minutes 5 times a week.  Think about what you will eat, plan ahead. Choose " clean, green, fresh or frozen" over canned, processed or packaged foods which are more sugary, salty and fatty. 70 to 75% of food eaten should be vegetables and fruit. Three meals at set times with snacks allowed between meals, but they must be fruit or vegetables. Aim to eat over a 12 hour period , example 7 am to 7 pm, and STOP after  your last meal of the day. Drink water,generally about 64 ounces per day, no other drink is as healthy. Fruit juice is best enjoyed in a healthy way, by EATING the fruit.  Thanks for choosing [facility], we consider it a privelige to serve you.

## 2021-11-08 ENCOUNTER — Ambulatory Visit (INDEPENDENT_AMBULATORY_CARE_PROVIDER_SITE_OTHER): Payer: Medicare Other

## 2021-11-08 ENCOUNTER — Other Ambulatory Visit: Payer: Self-pay

## 2021-11-08 DIAGNOSIS — Z122 Encounter for screening for malignant neoplasm of respiratory organs: Secondary | ICD-10-CM

## 2021-11-08 DIAGNOSIS — Z0000 Encounter for general adult medical examination without abnormal findings: Secondary | ICD-10-CM | POA: Diagnosis not present

## 2021-11-08 NOTE — Progress Notes (Signed)
Subjective:   Ryan Herrera is a 76 y.o. male who presents for an Initial Medicare Annual Wellness Visit. I connected with  [name] on 11/08/21 by a audio enabled telemedicine application and verified that I am speaking with the correct person using two identifiers.  Patient Location: Home  Provider Location: Office/Clinic  I discussed the limitations of evaluation and management by telemedicine. The patient expressed understanding and agreed to proceed.  Review of Systems    Defer to PCP       Objective:    There were no vitals filed for this visit. There is no height or weight on file to calculate BMI.  Advanced Directives 05/12/2020 12/11/2018 11/12/2018 10/08/2018 09/22/2018 04/17/2018 10/01/2017  Does Patient Have a Medical Advance Directive? No No No No No Yes No  Type of Advance Directive - - - - - - -  Would patient like information on creating a medical advance directive? - No - Patient declined - - - - -    Current Medications (verified) Outpatient Encounter Medications as of 11/08/2021  Medication Sig   allopurinol (ZYLOPRIM) 100 MG tablet Take 1 tablet (100 mg total) by mouth daily.   aspirin EC 81 MG tablet Take 81 mg by mouth daily.   B-12 MICROLOZENGE 500 MCG SUBL DISSOLVE 2 TABLETS UNDER THE TONGUE ONCE DAILY   BD PEN NEEDLE NANO U/F 32G X 4 MM MISC    Cholecalciferol 50 MCG (2000 UT) CAPS Take by mouth.   Evolocumab (REPATHA SURECLICK) 161 MG/ML SOAJ Inject 140 mg into the skin every 14 (fourteen) days.   ezetimibe (ZETIA) 10 MG tablet Take 1 tablet (10 mg total) by mouth daily.   glipiZIDE (GLUCOTROL) 10 MG tablet Take 10 mg by mouth 2 (two) times daily before a meal.   hydrochlorothiazide (HYDRODIURIL) 25 MG tablet Take 1 tablet (25 mg total) by mouth daily.   hydrocortisone (ANUSOL-HC) 2.5 % rectal cream Place 1 application rectally 2 (two) times daily as needed for hemorrhoids or anal itching.   hydrocortisone-pramoxine (ANALPRAM-HC)  2.5-1 % rectal cream Place 1 application rectally 3 (three) times daily.   insulin degludec (TRESIBA FLEXTOUCH) 100 UNIT/ML FlexTouch Pen Inject into the skin.   JARDIANCE 10 MG TABS tablet Take 1 tablet (10 mg total) by mouth every morning.   ketoconazole (NIZORAL) 2 % shampoo    levothyroxine (SYNTHROID, LEVOTHROID) 88 MCG tablet    losartan (COZAAR) 25 MG tablet Take by mouth.   MAG64 64 MG TBEC Take 1 tablet by mouth daily.   metFORMIN (GLUCOPHAGE) 1000 MG tablet Take 1,000 mg by mouth 2 (two) times daily with a meal.   omeprazole (PRILOSEC) 40 MG capsule Take 40 mg by mouth daily.   ONETOUCH VERIO test strip USE AS DIRECTED 4 TIMES A DAY   tadalafil (CIALIS) 5 MG tablet Take 5 mg by mouth daily as needed for erectile dysfunction.   valACYclovir (VALTREX) 500 MG tablet Take 500 mg by mouth 2 (two) times daily.   [DISCONTINUED] rosuvastatin (CRESTOR) 5 MG tablet Take 5 mg by mouth daily.   No facility-administered encounter medications on file as of 11/08/2021.    Allergies (verified) Mirabegron, Niacin and related, Other, and Statins   History: Past Medical History:  Diagnosis Date   Arthritis    hands   Coronary artery disease    Diabetes mellitus without complication ([name])    GERD (gastroesophageal reflux disease)    H/O poliomyelitis    age 29.  now has "  weak stomach muscles"   Hypertension    IBS (irritable bowel syndrome)    Iron deficiency anemia 09/22/2018   Renal insufficiency    Thyroid disease    Past Surgical History:  Procedure Laterality Date   BACK SURGERY     CORONARY ARTERY BYPASS GRAFT  2008   [facility] - 3 vessel   CYSTOSCOPY WITH INSERTION OF UROLIFT     HERNIA REPAIR     HYDROCELE EXCISION / REPAIR     JOINT REPLACEMENT     TONSILLECTOMY     Family History  Problem Relation Age of Onset   Hypertension Mother    Heart attack Mother    Osteoarthritis Mother    Stroke Father    Hypertension Father    Stroke Sister    Stroke Sister    Colon cancer  Neg Hx    Prostate cancer Neg Hx    Cancer - Lung Neg Hx    Social History   Socioeconomic History   Marital status: Single    Spouse name: Not on file   Number of children: 1   Years of education: Not on file   Highest education level: Not on file  Occupational History   Occupation: retired  Tobacco Use   Smoking status: Former    Packs/day: 1.00    Years: 38.00    Pack years: 38.00    Types: Cigarettes    Quit date: 12/1996    Years since quitting: 24.9   Smokeless tobacco: Never  Vaping Use   Vaping Use: Never used  Substance and Sexual Activity   Alcohol use: Yes    Comment: rarely   Drug use: No   Sexual activity: Not on file  Other Topics Concern   Not on file  Social History Narrative   Lives with his significant other, pt is retired    Investment banker, operational of Radio broadcast assistant Strain: Not on file  Food Insecurity: Not on file  Transportation Needs: Not on file  Physical Activity: Not on file  Stress: Not on file  Social Connections: Not on file    Tobacco Counseling Counseling given: Not Answered   Clinical Intake:                 Diabetic?Nutrition Risk Assessment:  Has the patient had any N/V/D within the last 2 months?  No  Does the patient have any non-healing wounds?  No  Has the patient had any unintentional weight loss or weight gain?  No   Diabetes:  Is the patient diabetic?  Yes  If diabetic, was a CBG obtained today?  No  Did the patient bring in their glucometer from home?  No  How often do you monitor your CBG's? Four times daily.   Financial Strains and Diabetes Management:  Are you having any financial strains with the device, your supplies or your medication? No .  Does the patient want to be seen by Chronic Care Management for management of their diabetes?  No  Would the patient like to be referred to a Nutritionist or for Diabetic Management?  No   Diabetic Exams:  Diabetic Eye Exam: Completed  09/2021 Diabetic Foot Exam: Overdue, Pt has been advised about the importance in completing this exam. Pt is scheduled for diabetic foot exam on 02/19/2022.          Activities of Daily Living No flowsheet data found.  Patient Care Team: Renee Rival, FNP as PCP - General (Nurse  Practitioner) Janina Mayo, MD as PCP - Cardiology (Cardiology)  Indicate any recent Medical Services you may have received from other than [facility] providers in the past year (date may be approximate).     Assessment:   This is a routine wellness examination for Ryan Herrera.  Hearing/Vision screen No results found.  Dietary issues and exercise activities discussed:     Goals Addressed   None   Depression Screen PHQ 2/9 Scores 10/22/2021  PHQ - 2 Score 0    Fall Risk Fall Risk  10/22/2021 10/15/2018  Falls in the past year? 0 0  Number falls in past yr: 0 -  Injury with Fall? 0 -  Risk for fall due to : No Fall Risks -  Follow up Falls evaluation completed -    FALL RISK PREVENTION PERTAINING TO THE HOME:  Any stairs in or around the home? Yes  If so, are there any without handrails? Yes  Home free of loose throw rugs in walkways, pet beds, electrical cords, etc? Yes  Adequate lighting in your home to reduce risk of falls? Yes   ASSISTIVE DEVICES UTILIZED TO PREVENT FALLS:  Life alert? No  Use of a cane, walker or w/c? No  Grab bars in the bathroom? No  Shower chair or bench in shower? No  Elevated toilet seat or a handicapped toilet? Yes    Cognitive Function:        Immunizations Immunization History  Administered Date(s) Administered   Influenza Split 07/19/2013   Influenza, High Dose Seasonal PF 06/13/2015, 07/18/2016, 06/11/2017, 07/08/2018, 07/02/2019, 09/15/2020, 07/27/2021   Influenza,inj,Quad PF,6+ Mos 08/08/2014   Influenza-Unspecified 07/15/2012, 07/15/2013, 08/08/2014, 06/13/2015, 07/18/2016   PFIZER Comirnaty(Gray Top)Covid-19 Tri-Sucrose Vaccine 11/27/2019,  12/19/2019, 07/26/2020, 04/20/2021   PPD Test 01/22/2013, 01/22/2013   Pfizer Covid-19 Vaccine Bivalent Booster 63yrs & up 07/27/2021   Pneumococcal Conjugate-13 05/04/2014   Pneumococcal Polysaccharide-23 07/15/2012   Td 02/15/2003   Tdap 05/04/2014   Zoster Recombinat (Shingrix) 02/12/2017, 06/04/2017    TDAP status: Up to date  Flu Vaccine status: Up to date  Pneumococcal vaccine status: Up to date  Covid-19 vaccine status: Information provided on how to obtain vaccines.   Qualifies for Shingles Vaccine? Yes   Zostavax completed No   Shingrix Completed?: No.    Education has been provided regarding the importance of this vaccine. Patient has been advised to call insurance company to determine out of pocket expense if they have not yet received this vaccine. Advised may also receive vaccine at local pharmacy or Health Dept. Verbalized acceptance and understanding.  Screening Tests Health Maintenance  Topic Date Due   HEMOGLOBIN A1C  Never done   FOOT EXAM  Never done   OPHTHALMOLOGY EXAM  Never done   Hepatitis C Screening  Never done   TETANUS/TDAP  05/04/2024   Pneumonia Vaccine 59+ Years old  Completed   INFLUENZA VACCINE  Completed   COVID-19 Vaccine  Completed   Zoster Vaccines- Shingrix  Completed   HPV VACCINES  Aged Out    Health Maintenance  Health Maintenance Due  Topic Date Due   HEMOGLOBIN A1C  Never done   FOOT EXAM  Never done   OPHTHALMOLOGY EXAM  Never done   Hepatitis C Screening  Never done    Colorectal cancer screening: No longer required.   Lung Cancer Screening: (Low Dose CT Chest recommended if Age 47-80 years, 30 pack-year currently smoking OR have quit w/in 15years.) does qualify.   Lung Cancer Screening Referral:  11/08/2021  Additional Screening:  Hepatitis C Screening: does not qualify; Completed not at high risk  Vision Screening: Recommended annual ophthalmology exams for early detection of glaucoma and other disorders of the  eye. Is the patient up to date with their annual eye exam?  Yes  Who is the provider or what is the name of the office in which the patient attends annual eye exams? [place] If pt is not established with a provider, would they like to be referred to a provider to establish care? No .   Dental Screening: Recommended annual dental exams for proper oral hygiene  Community Resource Referral / Chronic Care Management: CRR required this visit?  No   CCM required this visit?  No      Plan:     I have personally reviewed and noted the following in the patients chart:   Medical and social history Use of alcohol, tobacco or illicit drugs  Current medications and supplements including opioid prescriptions. Patient is not currently taking opioid prescriptions. Functional ability and status Nutritional status Physical activity Advanced directives List of other physicians Hospitalizations, surgeries, and ER visits in previous 12 months Vitals Screenings to include cognitive, depression, and falls Referrals and appointments  In addition, I have reviewed and discussed with patient certain preventive protocols, quality metrics, and best practice recommendations. A written personalized care plan for preventive services as well as general preventive health recommendations were provided to patient.     Earline Mayotte, [place]   11/08/2021   Nurse Notes:  Ryan Herrera , Thank you for taking time to come for your Medicare Wellness Visit. I appreciate your ongoing commitment to your health goals. Please review the following plan we discussed and let me know if I can assist you in the future.   These are the goals we discussed:  Goals   None     This is a list of the screening recommended for you and due dates:  Health Maintenance  Topic Date Due   Hemoglobin A1C  Never done   Complete foot exam   Never done   Eye exam for diabetics  Never done   Hepatitis C Screening: USPSTF  Recommendation to screen - Ages 41-79 yo.  Never done   Tetanus Vaccine  05/04/2024   Pneumonia Vaccine  Completed   Flu Shot  Completed   COVID-19 Vaccine  Completed   Zoster (Shingles) Vaccine  Completed   HPV Vaccine  Aged Out

## 2021-11-08 NOTE — Patient Instructions (Signed)

## 2021-11-16 ENCOUNTER — Encounter: Payer: Self-pay | Admitting: *Deleted

## 2021-11-26 ENCOUNTER — Other Ambulatory Visit: Payer: Self-pay

## 2021-11-26 ENCOUNTER — Encounter: Payer: Self-pay | Admitting: Urology

## 2021-11-26 ENCOUNTER — Telehealth: Payer: Self-pay

## 2021-11-26 ENCOUNTER — Ambulatory Visit (INDEPENDENT_AMBULATORY_CARE_PROVIDER_SITE_OTHER): Payer: Medicare Other | Admitting: Urology

## 2021-11-26 VITALS — BP 122/84 | HR 83 | Wt 201.0 lb

## 2021-11-26 DIAGNOSIS — N481 Balanitis: Secondary | ICD-10-CM | POA: Diagnosis not present

## 2021-11-26 MED ORDER — CLOTRIMAZOLE-BETAMETHASONE 1-0.05 % EX CREA: 1.0000 "application " | TOPICAL_CREAM | Freq: Two times a day (BID) | CUTANEOUS | 1 refills | Status: DC

## 2021-11-26 MED ORDER — OMEPRAZOLE 40 MG PO CPDR: 40.0000 mg | DELAYED_RELEASE_CAPSULE | Freq: Every day | ORAL | 1 refills | Status: DC

## 2021-11-26 NOTE — Progress Notes (Signed)
11/26/2021 3:24 PM   Ryan Herrera Feb 05, 1946 [id number]  Referring provider: No referring provider defined for this encounter.  CC: penis problem   HPI:  1) balanitis - he gets red and pain of foreskin going on since 2020 when he started [place]. Tried mupirocin ointment. Saw dermatology.   Patient follows with [facility] for BPH. Prostate was 56 g. He underwent PUL x 6 01/29/2021. He just saw NP Anderson-Riddell 11/22/2021.  He also takes tadalafil for ED.  PSA was 0.7 13 March 2021.  He has a history of urethral stricture disease but has not had to do CIC in quite some time and none was noted on the [facility] with Dr. Lilia Pro.    Today, "Ryan Herrera" is seen for the above.   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    PMH: Past Medical History:  Diagnosis Date   Arthritis    hands   Coronary artery disease    Diabetes mellitus without complication ([name])    GERD (gastroesophageal reflux disease)    H/O poliomyelitis    age 76.  now has "weak stomach muscles"   Hypertension    IBS (irritable bowel syndrome)    Iron deficiency anemia 09/22/2018   Renal insufficiency    Thyroid disease     Surgical History: Past Surgical History:  Procedure Laterality Date   BACK SURGERY     CORONARY ARTERY BYPASS GRAFT  2008   [facility] - 3 vessel   CYSTOSCOPY WITH INSERTION OF UROLIFT     HERNIA REPAIR     HYDROCELE EXCISION / REPAIR     JOINT REPLACEMENT     TONSILLECTOMY      Home Medications:  Allergies as of 11/26/2021       Reactions   Mirabegron Other (See Comments)   Niacin And Related Itching   Other    PT does not want OPIOIDS     Statins Itching        Medication List        Accurate as of November 26, 2021  3:24 PM. If you have any questions, ask your nurse or doctor.          allopurinol 100 MG tablet Commonly known as: ZYLOPRIM Take 1 tablet (100 mg total) by mouth  daily.   aspirin EC 81 MG tablet Take 81 mg by mouth daily.   B-12 Microlozenge 500 MCG Subl Generic drug: Cyanocobalamin DISSOLVE 2 TABLETS UNDER THE TONGUE ONCE DAILY   BD Pen Needle Nano U/F 32G X 4 MM Misc Generic drug: Insulin Pen Needle   Cholecalciferol 50 MCG (2000 UT) Caps Take by mouth.   ezetimibe 10 MG tablet Commonly known as: ZETIA Take 1 tablet (10 mg total) by mouth daily.   glipiZIDE 10 MG tablet Commonly known as: GLUCOTROL Take 10 mg by mouth 2 (two) times daily before a meal.   hydrochlorothiazide 25 MG tablet Commonly known as: HYDRODIURIL Take 1 tablet (25 mg total) by mouth daily.   hydrocortisone 2.5 % rectal cream Commonly known as: ANUSOL-HC Place 1 application rectally 2 (two) times daily as needed for hemorrhoids or anal itching.   hydrocortisone-pramoxine 2.5-1 % rectal cream Commonly known as: ANALPRAM-HC Place 1 application rectally 3 (three) times daily.   Jardiance 10 MG Tabs tablet Generic drug: empagliflozin Take 1 tablet (10 mg total) by mouth every morning.   ketoconazole 2 % shampoo  Commonly known as: NIZORAL   levothyroxine 88 MCG tablet Commonly known as: SYNTHROID   losartan 25 MG tablet Commonly known as: COZAAR Take by mouth.   Mag64 64 MG Tbec Generic drug: Magnesium Chloride Take 1 tablet by mouth daily.   metFORMIN 1000 MG tablet Commonly known as: GLUCOPHAGE Take 1,000 mg by mouth 2 (two) times daily with a meal.   omeprazole 40 MG capsule Commonly known as: PRILOSEC Take 1 capsule (40 mg total) by mouth daily.   OneTouch Verio test strip Generic drug: glucose blood USE AS DIRECTED 4 TIMES A DAY   Repatha SureClick 878 MG/ML Soaj Generic drug: Evolocumab Inject 140 mg into the skin every 14 (fourteen) days.   tadalafil 5 MG tablet Commonly known as: CIALIS Take 5 mg by mouth daily as needed for erectile dysfunction.   Tyler Aas FlexTouch 100 UNIT/ML FlexTouch Pen Generic drug: insulin  degludec Inject into the skin.   valACYclovir 500 MG tablet Commonly known as: VALTREX Take 500 mg by mouth 2 (two) times daily.        Allergies:  Allergies  Allergen Reactions   Mirabegron Other (See Comments)   Niacin And Related Itching   Other     PT does not want OPIOIDS     Statins Itching    Family History: Family History  Problem Relation Age of Onset   Hypertension Mother    Heart attack Mother    Osteoarthritis Mother    Stroke Father    Hypertension Father    Stroke Sister    Stroke Sister    Colon cancer Neg Hx    Prostate cancer Neg Hx    Cancer - Lung Neg Hx     Social History:  reports that he quit smoking about 24 years ago. His smoking use included cigarettes. He has a 38.00 pack-year smoking history. He has never used smokeless tobacco. He reports current alcohol use. He reports that he does not use drugs.   Physical Exam: BP 122/84    Pulse 83    Wt 201 lb (91.2 kg)    BMI 32.44 kg/m   Constitutional:  Alert and oriented, No acute distress. HEENT: [place] AT, moist mucus membranes.  Trachea midline, no masses. Cardiovascular: No clubbing, cyanosis, or edema. Respiratory: Normal respiratory effort, no increased work of breathing. GI: Abdomen is soft, nontender, nondistended, no abdominal masses GU: No CVA tenderness Lymph: No cervical or inguinal lymphadenopathy. Skin: No rashes, bruises or suspicious lesions. Neurologic: Grossly intact, no focal deficits, moving all 4 extremities. Psychiatric: Normal mood and affect. GU: Penis circumcised but some adhesions and a slightly buried penis, normal foreskin, testicles descended bilaterally and palpably normal, bilateral epididymis palpably normal, scrotum normal  He declined DRE  - reports he had this done at [facility].      Laboratory Data: Lab Results  Component Value Date   WBC 4.5 02/27/2021   HGB 13.7 02/27/2021   HCT 40.2 02/27/2021   MCV 91.2 02/27/2021   PLT 183 02/27/2021    Lab  Results  Component Value Date   CREATININE 0.97 02/27/2021    No results found for: PSA  No results found for: TESTOSTERONE  Lab Results  Component Value Date   HGBA1C 8.4 08/14/2021    Urinalysis    Component Value Date/Time   COLORURINE YELLOW 08/06/2018 [place] 08/06/2018 1851   APPEARANCEUR CLEAR 04/29/2014 1442   LABSPEC 1.010 08/06/2018 1851   LABSPEC 1.015 04/29/2014 1442   PHURINE  5.0 08/06/2018 1851   GLUCOSEU >1000 (A) 08/06/2018 1851   GLUCOSEU NEGATIVE 04/29/2014 1442   HGBUR NEGATIVE 08/06/2018 1851   BILIRUBINUR NEGATIVE 08/06/2018 1851   BILIRUBINUR NEGATIVE 04/29/2014 1442   KETONESUR NEGATIVE 08/06/2018 1851   PROTEINUR NEGATIVE 08/06/2018 1851   NITRITE NEGATIVE 08/06/2018 1851   LEUKOCYTESUR NEGATIVE 08/06/2018 1851   LEUKOCYTESUR TRACE 04/29/2014 1442    Lab Results  Component Value Date   BACTERIA NONE SEEN 08/06/2018     Results for orders placed during the hospital encounter of 07/22/18  US RENAL  Narrative CLINICAL DATA:  Initial evaluation for acute renal failure.  EXAM: RENAL / URINARY TRACT ULTRASOUND COMPLETE  COMPARISON:  None.  FINDINGS: Right Kidney:  Length: 10.3 cm. Echogenicity within normal limits. No mass or hydronephrosis visualized.  Left Kidney:  Length: 10.0 cm. Echogenicity within normal limits. No mass or hydronephrosis visualized.  Bladder:  Appears normal for degree of bladder distention. Right ureteral jet visualized. Left jet not seen.  Enlarged prostate measuring 4.4 x 3.4 x 4.7 cm.  IMPRESSION: 1. Normal renal ultrasound.  No hydronephrosis. 2. Enlarged prostate.   Electronically Signed By: Jeannine Boga M.D. On: 07/23/2018 00:33  No results found for this or any previous visit.  No results found for this or any previous visit.  No results found for this or any previous visit.   Assessment & Plan:    Balanitis - discussed that he would need a reconstructive  urologist to address the adhesions and buried penis. Trial of mycolog type ointment.   No follow-ups on file.  Festus Aloe, MD  [facility]  [address] [place], [place] [address] [phone]

## 2021-11-26 NOTE — Telephone Encounter (Signed)
Medication sent per med list it was 40 mg not 20 sent to pharmacy spoke with wife advised sent

## 2021-11-26 NOTE — Telephone Encounter (Signed)
Patient called need new med refill from [facility].  omeprazole (PRILOSEC) 20 MG capsule, patient takes 1 a day.  Pharmacy: [facility]

## 2021-11-26 NOTE — Telephone Encounter (Signed)
Pt has it on med list but it doesn't show who prescribe it or the quantity. Are you willing to send this in? Please advise

## 2021-11-27 ENCOUNTER — Other Ambulatory Visit: Payer: Self-pay | Admitting: Nurse Practitioner

## 2021-11-27 ENCOUNTER — Telehealth: Payer: Self-pay

## 2021-11-27 MED ORDER — OMEPRAZOLE 20 MG PO CPDR: 20.0000 mg | DELAYED_RELEASE_CAPSULE | Freq: Every day | ORAL | 3 refills | Status: DC

## 2021-11-27 NOTE — Telephone Encounter (Signed)
Patient called said omeprazole 40 mg was sent into the pharmacy instead 20 mg.  What does the patient need to do take it back or take the 40 mg instead of the 20 mg? Please contact patient [phone]

## 2021-11-28 ENCOUNTER — Other Ambulatory Visit: Payer: Self-pay

## 2021-11-28 NOTE — Telephone Encounter (Signed)
Patient aware.

## 2021-12-18 ENCOUNTER — Other Ambulatory Visit: Payer: Self-pay | Admitting: Nurse Practitioner

## 2021-12-25 ENCOUNTER — Telehealth: Payer: Self-pay | Admitting: Nurse Practitioner

## 2021-12-25 ENCOUNTER — Other Ambulatory Visit: Payer: Self-pay | Admitting: Nurse Practitioner

## 2021-12-25 DIAGNOSIS — K529 Noninfective gastroenteritis and colitis, unspecified: Secondary | ICD-10-CM

## 2021-12-25 NOTE — Telephone Encounter (Signed)
Please advise referral went to [facility] ?

## 2021-12-25 NOTE — Telephone Encounter (Signed)
Patient called in regard to [facility] referral. ? ?Patient wants referral placed in [place]. ?

## 2021-12-26 NOTE — Telephone Encounter (Signed)
Called pt to let him know that referral has been corrected no answer left vm ?

## 2021-12-27 ENCOUNTER — Telehealth: Payer: Self-pay

## 2021-12-27 ENCOUNTER — Ambulatory Visit: Payer: Medicare Other | Admitting: Gastroenterology

## 2021-12-27 NOTE — Telephone Encounter (Signed)
Moved home # to #1 and Cell #2 as requested.  Also let patient know that his Gastro referral is sent to [facility]. ?

## 2021-12-27 NOTE — Telephone Encounter (Signed)
Called pt no answer left vm 

## 2021-12-27 NOTE — Telephone Encounter (Signed)
Pt called back and spoke with Rosaria Ferries who relayed info ?

## 2022-01-08 ENCOUNTER — Ambulatory Visit (INDEPENDENT_AMBULATORY_CARE_PROVIDER_SITE_OTHER): Payer: Medicare Other

## 2022-01-08 ENCOUNTER — Ambulatory Visit
Admission: EM | Admit: 2022-01-08 | Discharge: 2022-01-08 | Disposition: A | Discharge status: Home / Self Care | Payer: Medicare Other | Attending: Family Medicine | Admitting: Family Medicine

## 2022-01-08 DIAGNOSIS — S52515A Nondisplaced fracture of left radial styloid process, initial encounter for closed fracture: Secondary | ICD-10-CM

## 2022-01-08 NOTE — ED Notes (Signed)
Radial gutter splint applied to left forearm/hand to include metacarpal 2,3 and thumb. Gauze applied between thumb, 2nd and 3rd metacarpal. Pt tolerated well.  ?

## 2022-01-08 NOTE — ED Triage Notes (Signed)
Pt states that yesterday afternoon he fell and caught himself with his left wrist  ? ?Pt is having swelling and pain in his left wrist ? ?Denies Meds ?

## 2022-01-08 NOTE — Discharge Instructions (Signed)
Follow-up with orthopedics as soon as possible for recheck ?

## 2022-01-08 NOTE — ED Provider Notes (Signed)
?[name] ? ? ? ?CSN: [id number] ?Arrival date & time: 01/08/22  1142 ? ? ?  ? ?History   ?Chief Complaint ?Chief Complaint  ?Patient presents with  ? Hand Injury  ?  Fell on left hand with wrist injury and swelling  ? ? ?HPI ?Ryan Herrera is a 76 y.o. male.  ? ?Presenting today with 1 day history of left wrist pain, swelling, bruising and discoloration after falling onto the arm yesterday.  He is unable to move at the wrist and states the pain is significant.  Denies numbness, tingling beyond baseline, skin injury.  Has not been taking anything for symptoms thus far. ? ? ?Past Medical History:  ?Diagnosis Date  ? Arthritis   ? hands  ? Coronary artery disease   ? Diabetes mellitus without complication ([place])   ? GERD (gastroesophageal reflux disease)   ? H/O poliomyelitis   ? age 70.  now has "weak stomach muscles"  ? Hypertension   ? IBS (irritable bowel syndrome)   ? Iron deficiency anemia 09/22/2018  ? Renal insufficiency   ? Thyroid disease   ? ? ?Patient Active Problem List  ? Diagnosis Date Noted  ? Penile irritation 10/22/2021  ? Chronic diarrhea 10/22/2021  ? Gout 10/22/2021  ? Cold sore 10/22/2021  ? Vitamin B 12 deficiency 10/22/2021  ? Age-related nuclear cataract of both eyes 10/16/2018  ? Iron deficiency anemia 09/22/2018  ? History of poliomyelitis 12/10/2017  ? Penile lesion 10/15/2017  ? Hyperopia with astigmatism and presbyopia, bilateral 04/21/2017  ? Diabetes mellitus due to underlying condition without complication, without long-term current use of insulin ([place]) 04/21/2017  ? History of chronic kidney disease 02/14/2015  ? Familial multiple lipoprotein-type hyperlipidemia 08/18/2014  ? Obesity 07/16/2013  ? Right inguinal hernia 06/16/2013  ? Arthritis of knee 08/12/2012  ? Knee joint replaced by other means 08/12/2012  ? Cervical spondylosis without myelopathy 07/29/2012  ? Osteoarthritis, hand 02/24/2012  ? BPH (benign prostatic hyperplasia) 02/11/2012  ? ED (erectile  dysfunction) 02/11/2012  ? CAD (coronary artery disease), native coronary artery 05/28/2011  ? Carpal tunnel syndrome, bilateral 05/28/2011  ? Chronic low back pain 05/28/2011  ? Diabetes mellitus type 2 without retinopathy ([place]) 05/28/2011  ? DJD (degenerative joint disease) 05/28/2011  ? Dyslipidemia 05/28/2011  ? Essential hypertension 05/28/2011  ? Lung nodules 05/28/2011  ? Hypothyroidism 05/28/2011  ? ? ?Past Surgical History:  ?Procedure Laterality Date  ? BACK SURGERY    ? CORONARY ARTERY BYPASS GRAFT  2008  ? [facility] - 3 vessel  ? CYSTOSCOPY WITH INSERTION OF UROLIFT    ? HERNIA REPAIR    ? HYDROCELE EXCISION / REPAIR    ? JOINT REPLACEMENT    ? TONSILLECTOMY    ? ? ? ? ? ?Home Medications   ? ?Prior to Admission medications   ?Medication Sig Start Date End Date Taking? Authorizing Provider  ?allopurinol (ZYLOPRIM) 100 MG tablet Take 1 tablet (100 mg total) by mouth daily. 10/22/21   Renee Rival, FNP  ?aspirin EC 81 MG tablet Take 81 mg by mouth daily.    [provider]  ?B-12 MICROLOZENGE 500 MCG SUBL DISSOLVE 2 TABLETS UNDER THE TONGUE ONCE DAILY 07/14/18   [provider]  ?BD PEN NEEDLE NANO U/F 32G X 4 MM MISC  08/26/18   [provider]  ?Cholecalciferol 50 MCG (2000 UT) CAPS Take by mouth. 03/07/20   [provider]  ?clotrimazole-betamethasone (LOTRISONE) cream Apply 1 application topically  2 (two) times daily. 11/26/21   Festus Aloe, MD  ?Evolocumab (REPATHA SURECLICK) 654 MG/ML SOAJ Inject 140 mg into the skin every 14 (fourteen) days. 09/10/21   Janina Mayo, MD  ?ezetimibe (ZETIA) 10 MG tablet Take 1 tablet (10 mg total) by mouth daily. 09/10/21   Janina Mayo, MD  ?glipiZIDE (GLUCOTROL) 10 MG tablet Take 10 mg by mouth 2 (two) times daily before a meal.    [provider]  ?hydrochlorothiazide (HYDRODIURIL) 25 MG tablet Take 1 tablet (25 mg total) by mouth daily. 09/10/21   Janina Mayo, MD  ?hydrocortisone (ANUSOL-HC) 2.5 % rectal cream  Place 1 application rectally 2 (two) times daily as needed for hemorrhoids or anal itching. 01/15/21   Scot Jun, FNP  ?hydrocortisone-pramoxine (ANALPRAM-HC) 2.5-1 % rectal cream Place 1 application rectally 3 (three) times daily. 10/02/17   Coral Spikes, DO  ?insulin degludec (TRESIBA FLEXTOUCH) 100 UNIT/ML FlexTouch Pen Inject into the skin. 08/07/19   [provider]  ?JARDIANCE 10 MG TABS tablet Take 1 tablet (10 mg total) by mouth every morning. 09/10/21   Janina Mayo, MD  ?ketoconazole (NIZORAL) 2 % shampoo  11/01/18   [provider]  ?levothyroxine (SYNTHROID, LEVOTHROID) 88 MCG tablet  11/10/18   [provider]  ?losartan (COZAAR) 25 MG tablet Take by mouth. 01/31/20 11/16/21  [provider]  ?MAG64 64 MG TBEC Take 1 tablet by mouth daily. 09/11/18   [provider]  ?metFORMIN (GLUCOPHAGE) 1000 MG tablet Take 1,000 mg by mouth 2 (two) times daily with a meal.    [provider]  ?omeprazole (PRILOSEC) 40 MG capsule TAKE 1 CAPSULE (40 MG TOTAL) BY MOUTH DAILY. 12/18/21   Renee Rival, FNP  ?ONETOUCH VERIO test strip USE AS DIRECTED 4 TIMES A DAY 09/22/18   [provider]  ?tadalafil (CIALIS) 5 MG tablet Take 5 mg by mouth daily as needed for erectile dysfunction.    [provider]  ?valACYclovir (VALTREX) 500 MG tablet Take 500 mg by mouth 2 (two) times daily.    [provider]  ?rosuvastatin (CRESTOR) 5 MG tablet Take 5 mg by mouth daily.  05/12/20  [provider]  ? ? ?Family History ?Family History  ?Problem Relation Age of Onset  ? Hypertension Mother   ? Heart attack Mother   ? Osteoarthritis Mother   ? Stroke Father   ? Hypertension Father   ? Stroke Sister   ? Stroke Sister   ? Colon cancer Neg Hx   ? Prostate cancer Neg Hx   ? Cancer - Lung Neg Hx   ? ? ?Social History ?Social History  ? ?Tobacco Use  ? Smoking status: Former  ?  Packs/day: 1.00  ?  Years: 38.00  ?  Pack years: 38.00  ?  Types:  Cigarettes  ?  Quit date: 12/1996  ?  Years since quitting: 25.1  ? Smokeless tobacco: Never  ?Vaping Use  ? Vaping Use: Never used  ?Substance Use Topics  ? Alcohol use: Yes  ? Drug use: No  ? ? ? ?Allergies   ?Mirabegron, Niacin and related, Other, and Statins ? ? ?Review of Systems ?Review of Systems ?Per HPI ? ?Physical Exam ?Triage Vital Signs ?ED Triage Vitals  ?Enc Vitals Group  ?   BP 01/08/22 1219 (!) 148/86  ?   Pulse Rate 01/08/22 1219 75  ?   Resp 01/08/22 1219 18  ?   Temp 01/08/22  1219 97.9 ?F (36.6 ?C)  ?   Temp Source 01/08/22 1219 Oral  ?   SpO2 01/08/22 1219 95 %  ?   Weight --   ?   Height --   ?   Head Circumference --   ?   Peak Flow --   ?   Pain Score 01/08/22 1217 9  ?   Pain Loc --   ?   Pain Edu? --   ?   Excl. in [place]? --   ? ?No data found. ? ?Updated Vital Signs ?BP (!) 148/86 (BP Location: Right Arm)   Pulse 75   Temp 97.9 ?F (36.6 ?C) (Oral)   Resp 18   SpO2 95%  ? ?Visual Acuity ?Right Eye Distance:   ?Left Eye Distance:   ?Bilateral Distance:   ? ?Right Eye Near:   ?Left Eye Near:    ?Bilateral Near:    ? ?Physical Exam ?Vitals and nursing note reviewed.  ?Constitutional:   ?   Appearance: Normal appearance.  ?HENT:  ?   Head: Atraumatic.  ?Eyes:  ?   Extraocular Movements: Extraocular movements intact.  ?   Conjunctiva/sclera: Conjunctivae normal.  ?Cardiovascular:  ?   Rate and Rhythm: Normal rate and regular rhythm.  ?Pulmonary:  ?   Effort: Pulmonary effort is normal.  ?   Breath sounds: Normal breath sounds.  ?Musculoskeletal:     ?   General: Swelling, tenderness and signs of injury present. Normal range of motion.  ?   Cervical back: Normal range of motion and neck supple.  ?   Comments: Left radial aspect of wrist edematous, bruised, significantly tender to palpation with minimal range of motion on exam  ?Skin: ?   General: Skin is warm and dry.  ?   Findings: Bruising and erythema present.  ?   Comments: Left radial aspect of wrist significantly bruised, hyperpigmented   ?Neurological:  ?   General: No focal deficit present.  ?   Mental Status: He is oriented to person, place, and time.  ?   Comments: Left upper extremity neurovascularly intact  ?Psychiatric:     ?   Mood and A

## 2022-02-04 ENCOUNTER — Other Ambulatory Visit: Payer: Self-pay | Admitting: Nurse Practitioner

## 2022-02-15 ENCOUNTER — Encounter: Payer: Self-pay | Admitting: Nurse Practitioner

## 2022-02-15 ENCOUNTER — Ambulatory Visit (INDEPENDENT_AMBULATORY_CARE_PROVIDER_SITE_OTHER): Payer: Medicare Other | Admitting: Nurse Practitioner

## 2022-02-15 DIAGNOSIS — Z794 Long term (current) use of insulin: Secondary | ICD-10-CM | POA: Diagnosis not present

## 2022-02-15 DIAGNOSIS — E1169 Type 2 diabetes mellitus with other specified complication: Secondary | ICD-10-CM

## 2022-02-15 NOTE — Progress Notes (Signed)
Virtual Visit via Telephone Note ? ?I connected with Ryan Herrera on 02/15/22 at 11:40 AM EDT by telephone and verified that I am speaking with the correct person using two identifiers.  I spent 8 minutes on this telephone encounter ? ?Location: ?Patient: HOME ?Provider: Office ?  ?I discussed the limitations, risks, security and privacy concerns of performing an evaluation and management service by telephone and the availability of in person appointments. I also discussed with the patient that there may be a patient responsible charge related to this service. The patient expressed understanding and agreed to proceed. ? ? ?History of Present Illness: ?Ryan Herrera with past medical history of CAD, essential hypertension, diabetes mellitus type 2, BPH, osteoarthritis needs referral to the endocrinology clinic for his diabetes, states that he has been going to the endo in [facility] in [place] but he would like to be seeing Dr Dorris Fetch . His wife has recently started seeing Dr Dorris Fetch and they are pleased with the care they are getting from Dr. Dorris Fetch.. takes tresiba 32 units daily, jardiance '10mg'$  daily, metformin '1000mg'$  BID, Glipizide '10mg'$  BID. Checks CBG once daily, sugar drops to around  56 ocassinally and he takes 2 glucose tablets when it drops.  ?  ?Observations/Objective: ? ? ?Assessment and Plan: ?Type 2 diabetes mellitus with other specified complication ([place]) ?Recent A1c was 8.4 ?Needs referral to new endocrinologist Dr Dorris Fetch ? takes tresiba 32 units daily, jardiance '10mg'$  daily , metformin '1000mg'$  BID, Glipizide '10mg'$  BID. Checks CBG once daily, sugar drops to 56 ocassinally and he takes 2 glucose tablets whenever it drops. ?Continue to monitor blood sugar and take medications as prescribed ?Patient referred to Dr. Dorris Fetch' s office ?  ? ?Follow Up Instructions: ? ?  ?I discussed the assessment and treatment plan with the patient. The patient was provided an opportunity to ask questions and all were answered. The patient  agreed with the plan and demonstrated an understanding of the instructions. ?  ?The patient was advised to call back or seek an in-person evaluation if the symptoms worsen or if the condition fails to improve as anticipated.  ?

## 2022-02-15 NOTE — Assessment & Plan Note (Addendum)
Recent A1c was 8.4 ?Needs referral to new endocrinologist Dr Dorris Fetch ? takes tresiba 32 units daily, jardiance '10mg'$  daily , metformin '1000mg'$  BID, Glipizide '10mg'$  BID. Checks CBG once daily, sugar drops to 56 ocassinally and he takes 2 glucose tablets whenever it drops. ?Continue to monitor blood sugar and take medications as prescribed ?Patient referred to Dr. Dorris Fetch' s office ? ?

## 2022-02-18 ENCOUNTER — Ambulatory Visit (INDEPENDENT_AMBULATORY_CARE_PROVIDER_SITE_OTHER): Payer: Medicare Other | Admitting: Urology

## 2022-02-18 ENCOUNTER — Encounter: Payer: Self-pay | Admitting: Urology

## 2022-02-18 VITALS — BP 132/67 | HR 72

## 2022-02-18 DIAGNOSIS — N478 Other disorders of prepuce: Secondary | ICD-10-CM

## 2022-02-18 NOTE — Progress Notes (Signed)
? ?02/18/2022 ?2:07 PM  ? ?Ryan Herrera ?1946-03-27 ?[id number] ? ?Referring provider: Renee Rival, FNP ?[address] ?[address] ?[place],  [place] [address] ? ?No chief complaint on file. ? ? ?HPI: ? ?1) balanitis - he gets red and pain of foreskin going on since 2020 when he started [place]. Tried mupirocin ointment. Saw dermatology. Tried mycolog Feb 2023.  ?  ?2) BPH - patient was followed by [facility] urology for BPH. Prostate was 56 g. He underwent PUL x 6 01/29/2021. He has a history of urethral stricture disease but has not had to do CIC in quite some time and none was noted on the [facility] with Dr. Lilia Pro. He saw NP [facility] 11/22/2021. PSA was 0.7 13 March 2021.   ? ?3) ED - He takes tadalafil for ED.   ?  ? Today, "Lad" is seen for the above. Mycolog helped but the skin stays wet and inflamed as the penis is partially buried.   ?  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  ? ? ?PMH: ?Past Medical History:  ?Diagnosis Date  ? Arthritis   ? hands  ? Coronary artery disease   ? Diabetes mellitus without complication ([place])   ? GERD (gastroesophageal reflux disease)   ? H/O poliomyelitis   ? age 76.  now has "weak stomach muscles"  ? Hypertension   ? IBS (irritable bowel syndrome)   ? Iron deficiency anemia 09/22/2018  ? Renal insufficiency   ? Thyroid disease   ? ? ?Surgical History: ?Past Surgical History:  ?Procedure Laterality Date  ? BACK SURGERY    ? CORONARY ARTERY BYPASS GRAFT  2008  ? [facility] - 3 vessel  ? CYSTOSCOPY WITH INSERTION OF UROLIFT    ? HERNIA REPAIR    ? HYDROCELE EXCISION / REPAIR    ? JOINT REPLACEMENT    ? TONSILLECTOMY    ? ? ?Home Medications:  ?Allergies as of 02/18/2022   ? ?   Reactions  ? Mirabegron Other (See Comments)  ? Niacin And Related Itching  ? Other   ? PT does not want OPIOIDS    ? Statins Itching  ? ?  ? ?  ?Medication List  ?  ? ?  ? Accurate as of Feb 18, 2022  2:07  PM. If you have any questions, ask your nurse or doctor.  ?  ?  ? ?  ? ?allopurinol 100 MG tablet ?Commonly known as: ZYLOPRIM ?Take 1 tablet (100 mg total) by mouth daily. ?  ?amoxicillin 500 MG tablet ?Commonly known as: AMOXIL ?amoxicillin 500 mg tablet ? TAKE 1 TAB EVERY 8 HOURS TILL GONE ?  ?aspirin EC 81 MG tablet ?Take 81 mg by mouth daily. ?  ?B-12 Microlozenge 500 MCG Subl ?Generic drug: Cyanocobalamin ?DISSOLVE 2 TABLETS UNDER THE TONGUE ONCE DAILY ?  ?BD Pen Needle Nano U/F 32G X 4 MM Misc ?Generic drug: Insulin Pen Needle ?  ?Cholecalciferol 50 MCG (2000 UT) Caps ?Take by mouth. ?  ?clotrimazole-betamethasone cream ?Commonly known as: LOTRISONE ?Apply 1 application topically 2 (two) times daily. ?  ?doxycycline 100 MG capsule ?Commonly known as: VIBRAMYCIN ?doxycycline hyclate 100 mg capsule ? TAKE 1 CAPSULE BY MOUTH TWICE A DAY FOR 30 DAYS ?  ?ezetimibe 10 MG tablet ?Commonly known as: ZETIA ?Take 1 tablet (10 mg total) by mouth daily. ?  ?glipiZIDE 10 MG tablet ?Commonly known  as: GLUCOTROL ?Take 10 mg by mouth 2 (two) times daily before a meal. ?  ?hydrochlorothiazide 25 MG tablet ?Commonly known as: HYDRODIURIL ?Take 1 tablet (25 mg total) by mouth daily. ?  ?hydrocortisone 2.5 % rectal cream ?Commonly known as: ANUSOL-HC ?Place 1 application rectally 2 (two) times daily as needed for hemorrhoids or anal itching. ?  ?hydrocortisone-pramoxine 2.5-1 % rectal cream ?Commonly known as: ANALPRAM-HC ?Place 1 application rectally 3 (three) times daily. ?  ?Jardiance 10 MG Tabs tablet ?Generic drug: empagliflozin ?Take 1 tablet (10 mg total) by mouth every morning. ?  ?ketoconazole 2 % shampoo ?Commonly known as: NIZORAL ?  ?levothyroxine 88 MCG tablet ?Commonly known as: SYNTHROID ?  ?losartan 25 MG tablet ?Commonly known as: COZAAR ?Take by mouth. ?  ?Mag64 64 MG Tbec ?Generic drug: Magnesium Chloride ?Take 1 tablet by mouth daily. ?  ?metFORMIN 1000 MG tablet ?Commonly known as: GLUCOPHAGE ?Take 1,000 mg  by mouth 2 (two) times daily with a meal. ?  ?omeprazole 40 MG capsule ?Commonly known as: PRILOSEC ?TAKE 1 CAPSULE BY MOUTH EVERY DAY ?  ?omeprazole 20 MG capsule ?Commonly known as: PRILOSEC ?Take by mouth. ?  ?OneTouch Verio test strip ?Generic drug: glucose blood ?USE AS DIRECTED 4 TIMES A DAY ?  ?Repatha SureClick 628 MG/ML Soaj ?Generic drug: Evolocumab ?Inject 140 mg into the skin every 14 (fourteen) days. ?  ?tadalafil 5 MG tablet ?Commonly known as: CIALIS ?Take 5 mg by mouth daily as needed for erectile dysfunction. ?  ?Tyler Aas FlexTouch 100 UNIT/ML FlexTouch Pen ?Generic drug: insulin degludec ?Inject into the skin. ?  ?valACYclovir 500 MG tablet ?Commonly known as: VALTREX ?Take 500 mg by mouth 2 (two) times daily. ?  ? ?  ? ? ?Allergies:  ?Allergies  ?Allergen Reactions  ? Mirabegron Other (See Comments)  ? Niacin And Related Itching  ? Other   ?  PT does not want OPIOIDS    ? Statins Itching  ? ? ?Family History: ?Family History  ?Problem Relation Age of Onset  ? Hypertension Mother   ? Heart attack Mother   ? Osteoarthritis Mother   ? Stroke Father   ? Hypertension Father   ? Stroke Sister   ? Stroke Sister   ? Colon cancer Neg Hx   ? Prostate cancer Neg Hx   ? Cancer - Lung Neg Hx   ? ? ?Social History:  reports that he quit smoking about 25 years ago. His smoking use included cigarettes. He has a 38.00 pack-year smoking history. He has never used smokeless tobacco. He reports current alcohol use. He reports that he does not use drugs. ? ? ?Physical Exam: ?BP 132/67   Pulse 72   ?Constitutional:  Alert and oriented, No acute distress. ?HEENT: [place] AT, moist mucus membranes.  Trachea midline, no masses. ?Cardiovascular: No clubbing, cyanosis, or edema. ?Respiratory: Normal respiratory effort, no increased work of breathing. ?GI: Abdomen is soft, nontender, nondistended, no abdominal masses ?GU: No CVA tenderness ?Lymph: No cervical or inguinal lymphadenopathy. ?Skin: No rashes, bruises or suspicious  lesions. ?Neurologic: Grossly intact, no focal deficits, moving all 4 extremities. ?Psychiatric: Normal mood and affect. ?GU: he has a slightly buried penis and adhesions around the coronal sulcus. Erythema much improved.  ? ?Laboratory Data: ?Lab Results  ?Component Value Date  ? WBC 4.5 02/27/2021  ? HGB 13.7 02/27/2021  ? HCT 40.2 02/27/2021  ? MCV 91.2 02/27/2021  ? PLT 183 02/27/2021  ? ? ?Lab Results  ?Component Value  Date  ? CREATININE 0.97 02/27/2021  ? ? ?No results found for: PSA ? ?No results found for: TESTOSTERONE ? ?Lab Results  ?Component Value Date  ? HGBA1C 8.4 08/14/2021  ? ? ?Urinalysis ?   ?Component Value Date/Time  ? [place] YELLOW 08/06/2018 1851  ? APPEARANCEUR CLEAR 08/06/2018 1851  ? APPEARANCEUR CLEAR 04/29/2014 1442  ? LABSPEC 1.010 08/06/2018 1851  ? LABSPEC 1.015 04/29/2014 1442  ? PHURINE 5.0 08/06/2018 1851  ? GLUCOSEU >1000 (A) 08/06/2018 1851  ? GLUCOSEU NEGATIVE 04/29/2014 1442  ? [place] NEGATIVE 08/06/2018 1851  ? [place] NEGATIVE 08/06/2018 1851  ? [place] NEGATIVE 04/29/2014 1442  ? [place] NEGATIVE 08/06/2018 1851  ? [place] NEGATIVE 08/06/2018 1851  ? NITRITE NEGATIVE 08/06/2018 1851  ? LEUKOCYTESUR NEGATIVE 08/06/2018 1851  ? LEUKOCYTESUR TRACE 04/29/2014 1442  ? ? ?Lab Results  ?Component Value Date  ? BACTERIA NONE SEEN 08/06/2018  ? ? ? ?Results for orders placed during the hospital encounter of 07/22/18 ? ?US RENAL ? ?Narrative ?CLINICAL DATA:  Initial evaluation for acute renal failure. ? ?EXAM: ?RENAL / URINARY TRACT ULTRASOUND COMPLETE ? ?COMPARISON:  None. ? ?FINDINGS: ?Right Kidney: ? ?Length: 10.3 cm. Echogenicity within normal limits. No mass or ?hydronephrosis visualized. ? ?Left Kidney: ? ?Length: 10.0 cm. Echogenicity within normal limits. No mass or ?hydronephrosis visualized. ? ?Bladder: ? ?Appears normal for degree of bladder distention. Right ureteral jet ?visualized. Left jet not seen. ? ?Enlarged prostate measuring 4.4 x 3.4 x 4.7  cm. ? ?IMPRESSION: ?1. Normal renal ultrasound.  No hydronephrosis. ?2. Enlarged prostate. ? ? ?Electronically Signed ?By: Jeannine Boga M.D. ?On: 07/23/2018 00:33 ? ?No results found for this or any previous

## 2022-02-19 ENCOUNTER — Ambulatory Visit: Payer: Medicare Other | Admitting: Nurse Practitioner

## 2022-02-21 ENCOUNTER — Other Ambulatory Visit: Payer: Self-pay

## 2022-02-21 ENCOUNTER — Ambulatory Visit
Admission: EM | Admit: 2022-02-21 | Discharge: 2022-02-21 | Disposition: A | Discharge status: Home / Self Care | Payer: Medicare Other | Attending: Nurse Practitioner | Admitting: Nurse Practitioner

## 2022-02-21 ENCOUNTER — Encounter: Payer: Self-pay | Admitting: Emergency Medicine

## 2022-02-21 DIAGNOSIS — R0789 Other chest pain: Secondary | ICD-10-CM

## 2022-02-21 DIAGNOSIS — K219 Gastro-esophageal reflux disease without esophagitis: Secondary | ICD-10-CM

## 2022-02-21 MED ORDER — LIDOCAINE VISCOUS HCL 2 % MT SOLN
15.0000 mL | Freq: Once | OROMUCOSAL | Status: AC
  Administered 2022-02-21: 15 mL via ORAL

## 2022-02-21 MED ORDER — PANTOPRAZOLE SODIUM 40 MG PO TBEC: 40.0000 mg | DELAYED_RELEASE_TABLET | Freq: Every day | ORAL | 0 refills | Status: DC

## 2022-02-21 MED ORDER — ALUM & MAG HYDROXIDE-SIMETH 200-200-20 MG/5ML PO SUSP: 30.0000 mL | Freq: Once | ORAL | Status: DC

## 2022-02-21 MED ORDER — ALUM & MAG HYDROXIDE-SIMETH 200-200-20 MG/5ML PO SUSP
30.0000 mL | Freq: Once | ORAL | Status: AC
  Administered 2022-02-21: 30 mL via ORAL

## 2022-02-21 NOTE — ED Triage Notes (Signed)
Pt reports intense chest pressure since 6am this am. Pt reports chest pressure woke him up out of his sleep and is currently rating it at 6/10. Denies any change with acid reflux medication or change in position.   Pt denies any shortness of breath and states "I would feel better I believe if I could burp or throw up."   Pt alert, non-diaphoretic. Nad noted.

## 2022-02-21 NOTE — ED Provider Notes (Signed)
[facility]    CSN: [id number] Arrival date & time: 02/21/22  1415      History   Chief Complaint Chief Complaint  Patient presents with   Chest Pain    HPI Ryan Herrera is a 76 y.o. male.   The patient is a 76 year old male who presents with reflux symptoms.  Patient states he woke this morning with heartburn and indigestion.  Symptoms have been present intermittent over the past week.  Patient has a history of GERD, normally takes omeprazole for his symptoms.  Patient also reports intense chest pressure since 6am this am. The chest pressure woke him up out of his sleep and is currently rating it at 6/10.  Patient describes pain as "heartburn".  He is pointing to his epigastric area when asked where his pain is.  Denies any change with acid reflux medication or change in position. Pt denies any shortness of breath and states "I would feel better I believe if I could burp or throw up."  Patient further denies difficulty breathing, nausea, vomiting, jaw pain, or radiation of pain into the left arm.    The history is provided by the patient.   Past Medical History:  Diagnosis Date   Arthritis    hands   Coronary artery disease    Diabetes mellitus without complication ([place])    GERD (gastroesophageal reflux disease)    H/O poliomyelitis    age 9.  now has "weak stomach muscles"   Hypertension    IBS (irritable bowel syndrome)    Iron deficiency anemia 09/22/2018   Renal insufficiency    Thyroid disease     Patient Active Problem List   Diagnosis Date Noted   Penile irritation 10/22/2021   Chronic diarrhea 10/22/2021   Gout 10/22/2021   Cold sore 10/22/2021   Vitamin B 12 deficiency 10/22/2021   Age-related nuclear cataract of both eyes 10/16/2018   Iron deficiency anemia 09/22/2018   History of poliomyelitis 12/10/2017   Penile lesion 10/15/2017   Hyperopia with astigmatism and presbyopia, bilateral 04/21/2017   Type 2 diabetes mellitus with  other specified complication ([place]) 36/62/9476   History of chronic kidney disease 02/14/2015   Familial multiple lipoprotein-type hyperlipidemia 08/18/2014   Obesity 07/16/2013   Right inguinal hernia 06/16/2013   Arthritis of knee 08/12/2012   Knee joint replaced by other means 08/12/2012   Cervical spondylosis without myelopathy 07/29/2012   Osteoarthritis, hand 02/24/2012   BPH (benign prostatic hyperplasia) 02/11/2012   ED (erectile dysfunction) 02/11/2012   CAD (coronary artery disease), native coronary artery 05/28/2011   Carpal tunnel syndrome, bilateral 05/28/2011   Chronic low back pain 05/28/2011   Diabetes mellitus type 2 without retinopathy ([place]) 05/28/2011   DJD (degenerative joint disease) 05/28/2011   Dyslipidemia 05/28/2011   Essential hypertension 05/28/2011   Lung nodules 05/28/2011   Hypothyroidism 05/28/2011    Past Surgical History:  Procedure Laterality Date   BACK SURGERY     CORONARY ARTERY BYPASS GRAFT  2008   [facility] - 3 vessel   CYSTOSCOPY WITH INSERTION OF UROLIFT     HERNIA REPAIR     HYDROCELE EXCISION / REPAIR     JOINT REPLACEMENT     TONSILLECTOMY         Home Medications    Prior to Admission medications   Medication Sig Start Date End Date Taking? Authorizing Provider  allopurinol (ZYLOPRIM) 100 MG tablet Take 1 tablet (100 mg total) by mouth daily. 10/22/21  Yes Paseda,  Dewaine Conger, FNP  aspirin EC 81 MG tablet Take 81 mg by mouth daily.   Yes [provider]  B-12 MICROLOZENGE 500 MCG SUBL DISSOLVE 2 TABLETS UNDER THE TONGUE ONCE DAILY 07/14/18  Yes [provider]  doxycycline (VIBRAMYCIN) 100 MG capsule doxycycline hyclate 100 mg capsule  TAKE 1 CAPSULE BY MOUTH TWICE A DAY FOR 30 DAYS   Yes [provider]  Evolocumab (REPATHA SURECLICK) 127 MG/ML SOAJ Inject 140 mg into the skin every 14 (fourteen) days. 09/10/21  Yes Janina Mayo, MD  ezetimibe (ZETIA) 10 MG tablet Take 1 tablet (10 mg total) by mouth  daily. 09/10/21  Yes Janina Mayo, MD  glipiZIDE (GLUCOTROL) 10 MG tablet Take 10 mg by mouth 2 (two) times daily before a meal.   Yes [provider]  hydrochlorothiazide (HYDRODIURIL) 25 MG tablet Take 1 tablet (25 mg total) by mouth daily. 09/10/21  Yes BranchRoyetta Crochet, MD  insulin degludec (TRESIBA FLEXTOUCH) 100 UNIT/ML FlexTouch Pen Inject into the skin. 08/07/19  Yes [provider]  JARDIANCE 10 MG TABS tablet Take 1 tablet (10 mg total) by mouth every morning. 09/10/21  Yes Janina Mayo, MD  ketoconazole (NIZORAL) 2 % shampoo  11/01/18  Yes [provider]  levothyroxine (SYNTHROID, LEVOTHROID) 88 MCG tablet  11/10/18  Yes [provider]  losartan (COZAAR) 25 MG tablet Take by mouth. 01/31/20 02/21/22 Yes [provider]  MAG64 64 MG TBEC Take 1 tablet by mouth daily. 09/11/18  Yes [provider]  metFORMIN (GLUCOPHAGE) 1000 MG tablet Take 1,000 mg by mouth 2 (two) times daily with a meal.   Yes [provider]  omeprazole (PRILOSEC) 40 MG capsule TAKE 1 CAPSULE BY MOUTH EVERY DAY 02/05/22  Yes Paseda, Dewaine Conger, FNP  ONETOUCH VERIO test strip USE AS DIRECTED 4 TIMES A DAY 09/22/18  Yes [provider]  pantoprazole (PROTONIX) 40 MG tablet Take 1 tablet (40 mg total) by mouth daily. 02/21/22  Yes Gautam Langhorst-Warren, Alda Lea, NP  tadalafil (CIALIS) 5 MG tablet Take 5 mg by mouth daily as needed for erectile dysfunction.   Yes [provider]  valACYclovir (VALTREX) 500 MG tablet Take 500 mg by mouth 2 (two) times daily.   Yes [provider]  amoxicillin (AMOXIL) 500 MG tablet amoxicillin 500 mg tablet  TAKE 1 TAB EVERY 8 HOURS TILL GONE Patient not taking: Reported on 02/15/2022    [provider]  BD PEN NEEDLE NANO U/F 32G X 4 MM MISC  08/26/18   [provider]  Cholecalciferol 50 MCG (2000 UT) CAPS Take by mouth. Patient not taking: Reported on 02/18/2022 03/07/20   [provider]  clotrimazole-betamethasone (LOTRISONE) cream Apply 1 application topically 2 (two) times daily. Patient not taking: Reported on 02/18/2022 11/26/21   Festus Aloe, MD  hydrocortisone (ANUSOL-HC) 2.5 % rectal cream Place 1 application rectally 2 (two) times daily as needed for hemorrhoids or anal itching. Patient not taking: Reported on 02/18/2022 01/15/21   Scot Jun, FNP  hydrocortisone-pramoxine [facility]) 2.5-1 % rectal cream Place 1 application rectally 3 (three) times daily. Patient not taking: Reported on 02/18/2022 10/02/17   Coral Spikes, DO  omeprazole (PRILOSEC) 20 MG capsule Take by mouth. 02/15/22   [provider]  rosuvastatin (CRESTOR) 5 MG tablet Take 5 mg by mouth daily.  05/12/20  [provider]    Family History Family History  Problem Relation Age of Onset   Hypertension  Mother    Heart attack Mother    Osteoarthritis Mother    Stroke Father    Hypertension Father    Stroke Sister    Stroke Sister    Colon cancer Neg Hx    Prostate cancer Neg Hx    Cancer - Lung Neg Hx     Social History Social History   Tobacco Use   Smoking status: Former    Packs/day: 1.00    Years: 38.00    Pack years: 38.00    Types: Cigarettes    Quit date: 12/1996    Years since quitting: 25.2   Smokeless tobacco: Never  Vaping Use   Vaping Use: Never used  Substance Use Topics   Alcohol use: Yes   Drug use: No     Allergies   Mirabegron, Niacin and related, Other, and Statins   Review of Systems Review of Systems  Constitutional: Negative.   Respiratory: Negative.    Cardiovascular: Negative.   Gastrointestinal:  Positive for abdominal pain (epigastric). Negative for diarrhea, nausea and vomiting.  Psychiatric/Behavioral: Negative.      Physical Exam Triage Vital Signs ED Triage Vitals [02/21/22 1430]  Enc Vitals Group     BP 133/87     Pulse Rate 72     Resp 20     Temp 97.6 F (36.4 C)     Temp Source Oral     SpO2 92 %      Weight 198 lb (89.8 kg)     Height 5' 6.5" (1.689 m)     Head Circumference      Peak Flow      Pain Score 6     Pain Loc      Pain Edu?      Excl. in [place]?    No data found.  Updated Vital Signs BP 133/87 (BP Location: Right Arm)   Pulse 72   Temp 97.6 F (36.4 C) (Oral)   Resp 20   Ht 5' 6.5" (1.689 m)   Wt 198 lb (89.8 kg)   SpO2 92%   BMI 31.48 kg/m   Visual Acuity Right Eye Distance:   Left Eye Distance:   Bilateral Distance:    Right Eye Near:   Left Eye Near:    Bilateral Near:     Physical Exam Vitals and nursing note reviewed.  Constitutional:      Appearance: He is well-developed.  HENT:     Head: Normocephalic.  Eyes:     Pupils: Pupils are equal, round, and reactive to light.  Cardiovascular:     Rate and Rhythm: Normal rate and regular rhythm.     Heart sounds: Normal heart sounds.  Pulmonary:     Effort: Pulmonary effort is normal.     Breath sounds: Normal breath sounds.  Abdominal:     General: Bowel sounds are normal.     Palpations: Abdomen is soft.     Comments: Reports improvement of symptoms after GI cocktail  Musculoskeletal:     Cervical back: Normal range of motion.  Skin:    General: Skin is warm and dry.  Neurological:     General: No focal deficit present.     Mental Status: He is alert and oriented to person, place, and time.  Psychiatric:        Mood and Affect: Mood is not anxious.        Behavior: Behavior is not agitated.     UC Treatments / Results  Labs (all labs ordered are listed, but only abnormal results are displayed) Labs Reviewed - No data to display  EKG: Borderline ECG; Sinus rhythm with 1st degree AV block. No change from ECG on 09/10/21   Radiology No results found.  Procedures Procedures (including critical care time)  Medications Ordered in UC Medications  alum & mag hydroxide-simeth (MAALOX/MYLANTA) 200-200-20 MG/5ML suspension 30 mL (30 mLs Oral Given 02/21/22 1642)    And  lidocaine  (XYLOCAINE) 2 % viscous mouth solution 15 mL (15 mLs Oral Given 02/21/22 1642)    Initial Impression / Assessment and Plan / UC Course  I have reviewed the triage vital signs and the nursing notes.  Pertinent labs & imaging results that were available during my care of the patient were reviewed by me and considered in my medical decision making (see chart for details).  The patient is a 76 year old male who presents with symptoms of reflux.  Symptoms started this morning, awakening from his sleep.  He denies chest pain, shortness of breath, difficulty breathing,, vomiting, radiation of pain.  Patient states that pain is in the "middle of his stomach" or epigastric area.  Patient has a significant history of reflux disease.  He currently takes omeprazole.  His exam is reassuring and consistent with reflux.  An EKG was performed which showed no EKG changes since his EKG done in December, 2022.  Patient was provided a GI cocktail in the clinic today which gave him some relief.  Patient was also prescribed pantoprazole to see if this helps his symptoms.  Patient was advised to avoid triggers for his reflux.  He is scheduled to see GI in the upcoming weeks.  Patient was given strict return precautions of when to go to the ER versus when to return to our clinic. Final Clinical Impressions(s) / UC Diagnoses   Final diagnoses:  Chest pressure  Gastroesophageal reflux disease, unspecified whether esophagitis present     Discharge Instructions      Take medication as prescribed. Try to avoid foods that trigger your reflux.  As discussed, I recommend purchasing over-the-counter Mylanta for any breakthrough reflux symptoms. Sit up approximately 2 to 3 hours after eating as you are currently doing. Recommend drinking something that has carbonation to help you burp. Follow-up in the emergency department if you develop chest pain, shortness of breath, difficulty breathing, nausea, vomiting, or radiation of  pain into the jaw or left arm. Follow-up with your gastroenterologist as scheduled. Follow-up as needed.     ED Prescriptions     Medication Sig Dispense Auth. Provider   pantoprazole (PROTONIX) 40 MG tablet Take 1 tablet (40 mg total) by mouth daily. 30 tablet Margareth Kanner-Warren, Alda Lea, NP      PDMP not reviewed this encounter.   Tish Men, NP 02/21/22 1722

## 2022-02-21 NOTE — Discharge Instructions (Addendum)
Take medication as prescribed. Try to avoid foods that trigger your reflux.  As discussed, I recommend purchasing over-the-counter Mylanta for any breakthrough reflux symptoms. Sit up approximately 2 to 3 hours after eating as you are currently doing. Recommend drinking something that has carbonation to help you burp. Follow-up in the emergency department if you develop chest pain, shortness of breath, difficulty breathing, nausea, vomiting, or radiation of pain into the jaw or left arm. Follow-up with your gastroenterologist as scheduled. Follow-up as needed.

## 2022-02-25 ENCOUNTER — Telehealth: Payer: Self-pay | Admitting: Nurse Practitioner

## 2022-02-25 NOTE — Telephone Encounter (Signed)
Pt called back in regards to a missed call about a referral?

## 2022-02-25 NOTE — Progress Notes (Unsigned)
GI Office Note    Referring Provider: Renee Rival, FNP Primary Care Physician:  Renee Rival, FNP  Primary GI: Dr. Abbey Chatters  Chief Complaint   Chief Complaint  Patient presents with   Gastroesophageal Reflux    Had a severe case of heart burn a couple nights in a row.      History of Present Illness   Ryan Herrera is a 76 y.o. male presenting today at the request of Renee Rival, FNP for GERD  Patient recently seen in the ER for chest pressure.  Per review of notes it appears the patient presented with symptoms consistent with heartburn/acid reflux.  He reported that symptoms woke him up from sleep, reporting pain in the middle of the stomach.  He was currently on omeprazole.  EKG performed revealing sinus rhythm with first-degree AV block, without changes to a prior EKG performed December 2022.  He was provided GI cocktail in which she stated that gave him some relief.  He was sent home for frustration for pantoprazole 40 mg daily.   GERD No more GERD since the pantoprazole. Had GI cocktail that helped his symptoms some and then had a great day. On the third day he woke up in the middle of the night - felt like he swallowed something that solidified in his throat but after that he has been good. Had previously taken omeprazole for many years.   Does have a ventral hernia that he reports has been present for a while.   Hasn't had a GI doctor in 10 years. Previously has a [facility] physician in [place] and [place]. Reports he was having bad/hot breath and did a urea breath test and was treated for that (has been a while).   Diarrhea/Change in bowel habits Over a year ago - possibly 2 he begin having a short window of going to the bathroom - if he is unable to go during that time frame he would get constipated for a few days. Now he has been having to go to the bathroom in the morning and then having a variety of bowel movement consistencies and  amounts. Rarely has normal Bristol 4 stools, starts with 5, then 6 then 7. Its always large amounts. When its liquid it comes out with force. Does have severe sharp pain with urgency. Reports a couple of times he has been caught out in public unable to find a bathroom.  Denies melena or hematochezia.  Denies unintentional weight loss, lack appetite, early satiety.  Reports his last colonoscopy was when he was 76 years old. Thinks last colonoscopy was in 2005/2007, with [facility] and does have urinary urgency from that.   Had been seeing oncology for iron infusions. Last appointment for a few months. She originally ordered EGD/colonoscopy to account for anemia and then appointments got cancelled due to the pandemic. Reports he has had trouble getting an appointment here due to referral issues. No fevers. No sick contacts.   Tresiba 30 units in the morning, sees a new endocrinologies in June.   Patient spent a great time explaining his many life stressors including an issue with selling his home and not being able to get all of his things.  He lately stated that he was told his homeowners insurance will be canceled if he did not clean up his yard and complete his new  deck in a timely fashion which has been bothersome to him as he is  not able to afford to have someone fix it for him and he is limited in what he can do given his many surgeries and comorbidities.   Past Medical History:  Diagnosis Date   Arthritis    hands   Coronary artery disease    Diabetes mellitus without complication ([name])    GERD (gastroesophageal reflux disease)    H/O poliomyelitis    age 45.  now has "weak stomach muscles"   Hypertension    IBS (irritable bowel syndrome)    Iron deficiency anemia 09/22/2018   Renal insufficiency    Thyroid disease     Past Surgical History:  Procedure Laterality Date   BACK SURGERY     CORONARY ARTERY BYPASS GRAFT  2008   [facility] - 3 vessel   CYSTOSCOPY WITH  INSERTION OF UROLIFT     HERNIA REPAIR     HYDROCELE EXCISION / REPAIR     JOINT REPLACEMENT     TONSILLECTOMY      Current Outpatient Medications  Medication Sig Dispense Refill   allopurinol (ZYLOPRIM) 100 MG tablet Take 1 tablet (100 mg total) by mouth daily. 30 tablet 3   aspirin EC 81 MG tablet Take 81 mg by mouth daily.     B-12 MICROLOZENGE 500 MCG SUBL DISSOLVE 2 TABLETS UNDER THE TONGUE ONCE DAILY  11   BD PEN NEEDLE NANO U/F 32G X 4 MM MISC      doxycycline (VIBRAMYCIN) 100 MG capsule doxycycline hyclate 100 mg capsule  TAKE 1 CAPSULE BY MOUTH TWICE A DAY FOR 30 DAYS     ezetimibe (ZETIA) 10 MG tablet Take 1 tablet (10 mg total) by mouth daily. 90 tablet 3   glipiZIDE (GLUCOTROL) 10 MG tablet Take 10 mg by mouth 2 (two) times daily before a meal.     hydrochlorothiazide (HYDRODIURIL) 25 MG tablet Take 1 tablet (25 mg total) by mouth daily. 90 tablet 3   insulin degludec (TRESIBA FLEXTOUCH) 100 UNIT/ML FlexTouch Pen Inject into the skin.     JARDIANCE 10 MG TABS tablet Take 1 tablet (10 mg total) by mouth every morning. 30 tablet 3   levothyroxine (SYNTHROID, LEVOTHROID) 88 MCG tablet      losartan (COZAAR) 25 MG tablet Take by mouth.     metFORMIN (GLUCOPHAGE) 1000 MG tablet Take 1,000 mg by mouth 2 (two) times daily with a meal.     pantoprazole (PROTONIX) 40 MG tablet Take 1 tablet (40 mg total) by mouth daily. 30 tablet 0   tadalafil (CIALIS) 5 MG tablet Take 5 mg by mouth daily as needed for erectile dysfunction.     valACYclovir (VALTREX) 500 MG tablet Take 500 mg by mouth 2 (two) times daily.     amoxicillin (AMOXIL) 500 MG tablet amoxicillin 500 mg tablet  TAKE 1 TAB EVERY 8 HOURS TILL GONE (Patient not taking: Reported on 02/15/2022)     Cholecalciferol 50 MCG (2000 UT) CAPS Take by mouth. (Patient not taking: Reported on 02/18/2022)     clotrimazole-betamethasone (LOTRISONE) cream Apply 1 application topically 2 (two) times daily. (Patient not taking: Reported on  02/18/2022) 30 g 1   Evolocumab (REPATHA SURECLICK) 825 MG/ML SOAJ Inject 140 mg into the skin every 14 (fourteen) days. 2 mL 3   hydrocortisone (ANUSOL-HC) 2.5 % rectal cream Place 1 application rectally 2 (two) times daily as needed for hemorrhoids or anal itching. (Patient not taking: Reported on 02/18/2022) 90 g 0   hydrocortisone-pramoxine (ANALPRAM-HC) 2.5-1 % rectal cream Place  1 application rectally 3 (three) times daily. (Patient not taking: Reported on 02/18/2022) 30 g 0   ketoconazole (NIZORAL) 2 % shampoo      MAG64 64 MG TBEC Take 1 tablet by mouth daily.     omeprazole (PRILOSEC) 20 MG capsule Take by mouth. (Patient not taking: Reported on 02/26/2022)     omeprazole (PRILOSEC) 40 MG capsule TAKE 1 CAPSULE BY MOUTH EVERY DAY (Patient not taking: Reported on 02/26/2022) 90 capsule 0   ONETOUCH VERIO test strip USE AS DIRECTED 4 TIMES A DAY     No current facility-administered medications for this visit.    Allergies as of 02/26/2022 - Review Complete 02/26/2022  Allergen Reaction Noted   Mirabegron Other (See Comments) 09/27/2019   Niacin and related Itching 06/21/2015   Other  12/21/2020   Statins Itching 06/21/2015    Family History  Problem Relation Age of Onset   Hypertension Mother    Heart attack Mother    Osteoarthritis Mother    Stroke Father    Hypertension Father    Stroke Sister    Stroke Sister    Colon cancer Neg Hx    Prostate cancer Neg Hx    Cancer - Lung Neg Hx     Social History   Socioeconomic History   Marital status: Divorced    Spouse name: Not on file   Number of children: 1   Years of education: 12   Highest education level: Master's degree (e.g., MA, MS, MEng, MEd, MSW, MBA)  Occupational History   Occupation: retired  Tobacco Use   Smoking status: Former    Packs/day: 1.00    Years: 38.00    Pack years: 38.00    Types: Cigarettes    Quit date: 12/1996    Years since quitting: 25.2   Smokeless tobacco: Never  Vaping Use   Vaping  Use: Never used  Substance and Sexual Activity   Alcohol use: Yes   Drug use: No   Sexual activity: Not Currently  Other Topics Concern   Not on file  Social History Narrative   Lives with his significant other, pt is retired    Investment banker, operational of Radio broadcast assistant Strain: Low Risk    Difficulty of Paying Living Expenses: Not hard at all  Food Insecurity: No Food Insecurity   Worried About Charity fundraiser in the Last Year: Never true   Arboriculturist in the Last Year: Never true  Transportation Needs: No Transportation Needs   Lack of Transportation (Medical): No   Lack of Transportation (Non-Medical): No  Physical Activity: Inactive   Days of Exercise per Week: 0 days   Minutes of Exercise per Session: 0 min  Stress: No Stress Concern Present   Feeling of Stress : Not at all  Social Connections: Socially Isolated   Frequency of Communication with Friends and Family: More than three times a week   Frequency of Social Gatherings with Friends and Family: Once a week   Attends Religious Services: Never   Marine scientist or Organizations: No   Attends Music therapist: Never   Marital Status: Divorced  Human resources officer Violence: Not At Risk   Fear of Current or Ex-Partner: No   Emotionally Abused: No   Physically Abused: No   Sexually Abused: No     Review of Systems   Gen: Denies any fever, chills, fatigue, weight loss, lack of appetite.  CV: Denies chest pain,  heart palpitations, peripheral edema, syncope.  Resp: Denies shortness of breath at rest or with exertion. Denies wheezing or cough.  GI:see HPI GU : Denies urinary burning, urinary frequency, urinary hesitancy MS: Denies joint pain, muscle weakness, cramps, or limitation of movement.  Derm: Denies rash, itching, dry skin Psych: Denies depression, anxiety, memory loss, and confusion Heme: Denies bruising, bleeding, and enlarged lymph nodes.   Physical Exam   BP 124/68    Pulse 84   Temp 97.6 F (36.4 C) (Temporal)   Ht '5\' 6"'$  (1.676 m)   Wt 200 lb 6.4 oz (90.9 kg)   BMI 32.35 kg/m   General:   Alert and oriented. Pleasant and cooperative. Well-nourished and well-developed.  Head:  Normocephalic and atraumatic. Eyes:  Without icterus, sclera clear and conjunctiva pink.  Ears:  Normal auditory acuity. Mouth:  No deformity or lesions, oral mucosa pink.  Lungs:  Clear to auscultation bilaterally. No wheezes, rales, or rhonchi. No distress.  Heart:  S1, S2 present without murmurs appreciated.  Abdomen:  +BS, soft, non-tender and non-distended. No HSM noted.  Soft ventral hernia present, not painful on exam. Rectal:  Deferred  Msk:  Symmetrical without gross deformities. Normal posture. Extremities:  Without edema. Neurologic:  Alert and  oriented x4;  grossly normal neurologically. Skin:  Intact without significant lesions or rashes. Psych:  Alert and cooperative. Normal mood and affect.   Assessment   Ryan Herrera is a 76 y.o. male with a history of arthritis, diabetes, GERD, HTN, IBS, IDA, thyroid disease, CAD s/p triple bypass in 2008 presenting today with GERD and changes in bowel habits.   GERD: Has been taking pantoprazole 40 mg daily after the ED visit. Having minor breakthrough symptoms.  Was previously on omeprazole for many years.  He had an urgent care visit on 5/18 with significant reflux symptoms.  He was given GI cocktail which improved his symptoms that night and into the next day then had return of symptoms.  After the third day of taking pantoprazole his symptoms resolved.  He should continue pantoprazole 40 mg daily, advised he could use Tums as needed for breakthrough symptoms.  Advised that if breakthrough symptoms became more frequent that we could increase his PPI to twice daily.  Changes in bowel habits/chronic diarrhea: Predominate chronic diarrhea.  Stools are mostly Bristol 6 or 7 with multiple a day.  Will  occasionally have periods of a couple days without a bowel movement after large amounts of diarrhea.  Denies needing to strain.  He does have fecal urgency and associated abdominal pain that improves after defecation.  Denies any melena or hematochezia.  No other alarm symptoms present.  Thinks his last colonoscopy was when he was age 50 with [facility], is not aware of any abnormal findings.  Denies family history of colon cancer or colon polyps.  Also advised a higher fiber diet along with addition of Benefiber 2 teaspoons daily.  Differentials include mixed IBS, malignancy, microscopic colitis. Given change in bowel habits with predominant chronic diarrhea, will proceed with colonoscopy for further evaluation.  Medication adjustments discussed with patient during visit today, separate instructions will be provided prior to procedure. Suspect he likely has a component of IBS with possible primary dominant diarrhea.  Not likely to be infectious in nature as he has not had any sick contacts and denies any constitutional symptoms and symptoms have been chronic.   PLAN   Continue PPI Recommend Benefiber 2 teaspoons daily.  Also encouraged higher  fiber diet, handout provided. Information on chronic diarrhea including diet provided. Proceed with colonoscopy with propofol by Dr. Abbey Chatters  in near future: the risks, benefits, and alternatives have been discussed with the patient in detail. The patient states understanding and desires to proceed.  ASA 3 Will need to take half normal dose of insulin the day prior to procedure, hold oral diabetes medications the night prior to the morning of procedure. Advised to contact us with any medication changes after he has seen the endocrinologist Follow-up to be determined post procedure.   Venetia Night, MSN, FNP-BC, AGACNP-BC [facility]

## 2022-02-26 ENCOUNTER — Ambulatory Visit (INDEPENDENT_AMBULATORY_CARE_PROVIDER_SITE_OTHER): Payer: Medicare Other | Admitting: Gastroenterology

## 2022-02-26 ENCOUNTER — Encounter: Payer: Self-pay | Admitting: Gastroenterology

## 2022-02-26 VITALS — BP 124/68 | HR 84 | Temp 97.6°F | Ht 66.0 in | Wt 200.4 lb

## 2022-02-26 DIAGNOSIS — K219 Gastro-esophageal reflux disease without esophagitis: Secondary | ICD-10-CM

## 2022-02-26 DIAGNOSIS — R197 Diarrhea, unspecified: Secondary | ICD-10-CM

## 2022-02-26 DIAGNOSIS — R194 Change in bowel habit: Secondary | ICD-10-CM | POA: Diagnosis not present

## 2022-02-26 NOTE — Patient Instructions (Signed)
We are scheduling you for a colonoscopy with propofol wit Dr. Abbey Chatters in the near future to further evaluate your change in bowel habits including your fecal urgency and diarrhea.  Medication adjustments: Do not take any oral diabetes medications the night prior to procedure or the morning of your procedure. You may continue to take your 81 mg aspirin If you take your insulin in the morning, please take only half of your normal dose as she will be only on a clear liquid diet the day prior to procedure.  Do not take any insulin the morning of your procedure.  After you see endocrinology please notify us if they made any changes so we can look at those recommendations and send out new preparation instructions.  I am attaching a handout for you regarding diet that is better for diarrhea including a higher fiber diet.  To better control your bowel habits I want you to begin taking Benefiber 2 teaspoons daily for 1 week, then may increase to 2 teaspoons twice a day.  Please only use MiraLAX if you begin to have constipation.  It was a pleasure to see you today. I want to create trusting relationships with patients. If you receive a survey regarding your visit,  I greatly appreciate you taking time to fill this out on paper or through your MyChart. I value your feedback.  Venetia Night, MSN, FNP-BC, AGACNP-BC [facility]

## 2022-03-08 ENCOUNTER — Ambulatory Visit (INDEPENDENT_AMBULATORY_CARE_PROVIDER_SITE_OTHER): Payer: Medicare HMO | Admitting: Nurse Practitioner

## 2022-03-08 ENCOUNTER — Encounter: Payer: Self-pay | Admitting: Oncology

## 2022-03-08 ENCOUNTER — Encounter: Payer: Self-pay | Admitting: Nurse Practitioner

## 2022-03-08 VITALS — BP 118/76 | HR 79 | Ht 66.0 in | Wt 201.0 lb

## 2022-03-08 DIAGNOSIS — N182 Chronic kidney disease, stage 2 (mild): Secondary | ICD-10-CM | POA: Insufficient documentation

## 2022-03-08 DIAGNOSIS — E785 Hyperlipidemia, unspecified: Secondary | ICD-10-CM

## 2022-03-08 DIAGNOSIS — E1169 Type 2 diabetes mellitus with other specified complication: Secondary | ICD-10-CM

## 2022-03-08 DIAGNOSIS — Z794 Long term (current) use of insulin: Secondary | ICD-10-CM

## 2022-03-08 DIAGNOSIS — I10 Essential (primary) hypertension: Secondary | ICD-10-CM

## 2022-03-08 DIAGNOSIS — E039 Hypothyroidism, unspecified: Secondary | ICD-10-CM | POA: Diagnosis not present

## 2022-03-08 DIAGNOSIS — K219 Gastro-esophageal reflux disease without esophagitis: Secondary | ICD-10-CM | POA: Insufficient documentation

## 2022-03-08 MED ORDER — PANTOPRAZOLE SODIUM 40 MG PO TBEC: 40.0000 mg | DELAYED_RELEASE_TABLET | Freq: Every day | ORAL | 3 refills | Status: DC

## 2022-03-08 MED ORDER — REPATHA SURECLICK 140 MG/ML ~~LOC~~ SOAJ: 140.0000 mg | SUBCUTANEOUS | 3 refills | Status: DC

## 2022-03-08 NOTE — Assessment & Plan Note (Signed)
Chronic condition Currently on levothyroxine 88 mcg tablets daily TSH ordered

## 2022-03-08 NOTE — Progress Notes (Signed)
Ryan Herrera     MRN: [id number]      DOB: January 07, 1946   HPI Ryan Herrera with past medical history of essential hypertension, CAD, hypothyroidism, type 2 diabetes with complication is here for follow up and re-evaluation of chronic medical conditions, medication management and review of any available recent lab and radiology data.    GERD. Was seen in the ED recently with complaints of acid reflux, heart burn and indigestions. Omeprazole was replaced with Protonix '40mg'$  daily , states  his symptoms has been well controlled since starting Protonix .  Has upcoming choloscopy in July   T2DM. Has upcomin appoint with endo. Had a blood sugar readings in the high 60's this morning, states that he took a glucose tablet, states that he does not frequently have hypoglycemic episodes  CKD .  Goes to [place] , states that he had urine creatine labs checked at his last visit was some months ago , goes to the [employer] office.  Denies shortness of breath, edema     ROS Denies recent fever or chills. Denies sinus pressure, nasal congestion, ear pain or sore throat. Denies chest congestion, productive cough or wheezing. Denies chest pains, palpitations and leg swelling Denies abdominal pain, nausea, vomiting,diarrhea or constipation.   Denies dysuria, frequency, hesitancy or incontinence. Denies headaches, seizures, numbness, or tingling. Denies depression, anxiety or insomnia.    PE  BP 118/76 (BP Location: Right Arm, Cuff Size: Large)   Pulse 79   Ht '5\' 6"'$  (1.676 m)   Wt 201 lb (91.2 kg)   SpO2 93%   BMI 32.44 kg/m   Patient alert and oriented and in no cardiopulmonary distress.  Chest: Clear to auscultation bilaterally.  CVS: S1, S2 no murmurs, no S3.Regular rate.  ABD: Soft non tender.   Ext: No edema  MS: Adequate ROM spine, shoulders, hips and knees.  Psych: Good eye contact, normal affect. Memory intact not anxious or depressed  appearing.    Assessment & Plan  Essential hypertension BP Readings from Last 3 Encounters:  03/08/22 118/76  02/26/22 124/68  02/21/22 133/87  Condition well-controlled on losartan 25 mg daily, Continue current medication DASH diet advised engage in regular daily exercises at least 150 minutes weekly as tolerated CMP ordered Follow-up in 6 months  Hypothyroidism Chronic condition Currently on levothyroxine 88 mcg tablets daily TSH ordered  Type 2 diabetes mellitus with other specified complication ([place]) Chronic condition uncontrolled Has upcoming appointment with endocrinologist Currently on metformin 1000 mg twice daily, Tresiba 32 units daily, Jardiance 10 mg daily, glipizide 10 mg twice daily Reports occasional hypoglycemic episodes, states that he takes glucose tablets as needed, refused glucagon injection prescription.  Patient encouraged to keep his upcoming appointment with endocrinologist he verbalized understanding Avoid sugar sweets soda We will order urine creatinine labs at next visit   Dyslipidemia Currently on Repatha 140 mg injection, Zetia 10 mg daily Lipid panel ordered LDL goal is less than 55 Avoid fried fatty foods  Gastroesophageal reflux disease Currently on Protonix 40 mg daily Condition well-controlled Medications refilled today Avoid fatty fried foods, caffeine  CKD (chronic kidney disease), stage II Lab Results  Component Value Date   NA 136 02/27/2021   K 3.8 02/27/2021   CO2 25 02/27/2021   GLUCOSE 177 (H) 02/27/2021   BUN 17 02/27/2021   CREATININE 0.97 02/27/2021   CALCIUM 9.0 02/27/2021   GFRNONAA >60 02/27/2021  Chronic condition well-controlled on Jardiance 10 mg daily, losartan 25 mg dailyFollowed  by nephrology Avoid NSAIDs and other nephrotoxic agents CMP ordered

## 2022-03-08 NOTE — Patient Instructions (Signed)

## 2022-03-08 NOTE — Assessment & Plan Note (Addendum)
Chronic condition uncontrolled Has upcoming appointment with endocrinologist Currently on metformin 1000 mg twice daily, Tresiba 32 units daily, Jardiance 10 mg daily, glipizide 10 mg twice daily Reports occasional hypoglycemic episodes, states that he takes glucose tablets as needed, refused glucagon injection prescription.  Patient encouraged to keep his upcoming appointment with endocrinologist he verbalized understanding Avoid sugar sweets soda We will order urine creatinine labs at next visit

## 2022-03-08 NOTE — Assessment & Plan Note (Signed)
Currently on Repatha 140 mg injection, Zetia 10 mg daily Lipid panel ordered LDL goal is less than 55 Avoid fried fatty foods

## 2022-03-08 NOTE — Assessment & Plan Note (Signed)
BP Readings from Last 3 Encounters:  03/08/22 118/76  02/26/22 124/68  02/21/22 133/87  Condition well-controlled on losartan 25 mg daily, Continue current medication DASH diet advised engage in regular daily exercises at least 150 minutes weekly as tolerated CMP ordered Follow-up in 6 months

## 2022-03-08 NOTE — Assessment & Plan Note (Signed)
Currently on Protonix 40 mg daily Condition well-controlled Medications refilled today Avoid fatty fried foods, caffeine

## 2022-03-08 NOTE — Assessment & Plan Note (Signed)
Lab Results  Component Value Date   NA 136 02/27/2021   K 3.8 02/27/2021   CO2 25 02/27/2021   GLUCOSE 177 (H) 02/27/2021   BUN 17 02/27/2021   CREATININE 0.97 02/27/2021   CALCIUM 9.0 02/27/2021   GFRNONAA >60 02/27/2021  Chronic condition well-controlled on Jardiance 10 mg daily, losartan 25 mg dailyFollowed by nephrology Avoid NSAIDs and other nephrotoxic agents CMP ordered

## 2022-03-09 ENCOUNTER — Emergency Department (HOSPITAL_COMMUNITY): Payer: Medicare HMO

## 2022-03-09 ENCOUNTER — Emergency Department (HOSPITAL_COMMUNITY)
Admission: EM | Admit: 2022-03-09 | Discharge: 2022-03-09 | Disposition: A | Discharge status: Home / Self Care | Payer: Medicare HMO | Attending: Emergency Medicine | Admitting: Emergency Medicine

## 2022-03-09 ENCOUNTER — Encounter (HOSPITAL_COMMUNITY): Payer: Self-pay

## 2022-03-09 ENCOUNTER — Other Ambulatory Visit: Payer: Self-pay

## 2022-03-09 ENCOUNTER — Ambulatory Visit: Admission: EM | Admit: 2022-03-09 | Discharge: 2022-03-09 | Discharge status: Absent without leave | Payer: Medicare HMO

## 2022-03-09 DIAGNOSIS — S6991XA Unspecified injury of right wrist, hand and finger(s), initial encounter: Secondary | ICD-10-CM | POA: Diagnosis present

## 2022-03-09 DIAGNOSIS — S68124A Partial traumatic metacarpophalangeal amputation of right ring finger, initial encounter: Secondary | ICD-10-CM | POA: Insufficient documentation

## 2022-03-09 DIAGNOSIS — Z7982 Long term (current) use of aspirin: Secondary | ICD-10-CM | POA: Diagnosis not present

## 2022-03-09 DIAGNOSIS — Z23 Encounter for immunization: Secondary | ICD-10-CM | POA: Insufficient documentation

## 2022-03-09 DIAGNOSIS — W230XXA Caught, crushed, jammed, or pinched between moving objects, initial encounter: Secondary | ICD-10-CM | POA: Insufficient documentation

## 2022-03-09 DIAGNOSIS — S68119A Complete traumatic metacarpophalangeal amputation of unspecified finger, initial encounter: Secondary | ICD-10-CM

## 2022-03-09 MED ORDER — BUPIVACAINE HCL (PF) 0.5 % IJ SOLN
10.0000 mL | Freq: Once | INTRAMUSCULAR | Status: AC
  Administered 2022-03-09: 10 mL
  Filled 2022-03-09: qty 30

## 2022-03-09 MED ORDER — CEFAZOLIN SODIUM-DEXTROSE 2-4 GM/100ML-% IV SOLN
2.0000 g | Freq: Once | INTRAVENOUS | Status: AC
  Administered 2022-03-09: 2 g via INTRAVENOUS
  Filled 2022-03-09: qty 100

## 2022-03-09 MED ORDER — LIDOCAINE HCL (PF) 1 % IJ SOLN
5.0000 mL | Freq: Once | INTRAMUSCULAR | Status: AC
Start: 2022-03-09 — End: 2022-03-09
  Administered 2022-03-09: 5 mL
  Filled 2022-03-09: qty 5

## 2022-03-09 MED ORDER — POVIDONE-IODINE 10 % EX SOLN
CUTANEOUS | Status: AC
  Filled 2022-03-09: qty 14.8

## 2022-03-09 MED ORDER — OXYCODONE-ACETAMINOPHEN 5-325 MG PO TABS
1.0000 | ORAL_TABLET | Freq: Once | ORAL | Status: DC
  Filled 2022-03-09: qty 1

## 2022-03-09 MED ORDER — TETANUS-DIPHTH-ACELL PERTUSSIS 5-2.5-18.5 LF-MCG/0.5 IM SUSY
0.5000 mL | PREFILLED_SYRINGE | Freq: Once | INTRAMUSCULAR | Status: AC
  Administered 2022-03-09: 0.5 mL via INTRAMUSCULAR
  Filled 2022-03-09: qty 0.5

## 2022-03-09 NOTE — ED Triage Notes (Signed)
Pt came in today and stated he had cut his finger completely off. After assessing the situation I was advised by the Physician to send him to the ER. Pt stated that he felt dizzy so I asked him if he wanted to have his vital signs checked to make sure that he was ok and to check to see if we needed to call an ambulance. Pt declined all services and states that he would be ok to drive himself to the ER. Pt left without vitals being taken or the ambulance being called. Physician notified.

## 2022-03-09 NOTE — ED Provider Notes (Signed)
[facility] EMERGENCY DEPARTMENT Provider Note   CSN: [id number] Arrival date & time: 03/09/22  1408     History  Chief Complaint  Patient presents with   Hand Injury    4th digit R hand    Ryan Herrera is a 76 y.o. male.  HPI Patient is a 76 year old male who presents to the emergency department due to amputation of the end of the right fourth digit.  He is right-hand dominant.  States this occurred just prior to arrival after he accidentally slammed his hand in a car door.  Reports bleeding in the region as well as some moderate pain.  No other regions of injury.  Unsure of the timing of his last Tdap.    Home Medications Prior to Admission medications   Medication Sig Start Date End Date Taking? Authorizing Provider  allopurinol (ZYLOPRIM) 100 MG tablet Take 1 tablet (100 mg total) by mouth daily. 10/22/21   Renee Rival, FNP  aspirin EC 81 MG tablet Take 81 mg by mouth daily.    [provider]  B-12 MICROLOZENGE 500 MCG SUBL DISSOLVE 2 TABLETS UNDER THE TONGUE ONCE DAILY 07/14/18   [provider]  BD PEN NEEDLE NANO U/F 32G X 4 MM MISC  08/26/18   [provider]  doxycycline (VIBRAMYCIN) 100 MG capsule doxycycline hyclate 100 mg capsule  TAKE 1 CAPSULE BY MOUTH TWICE A DAY FOR 30 DAYS    [provider]  Evolocumab (REPATHA SURECLICK) 283 MG/ML SOAJ Inject 140 mg into the skin every 14 (fourteen) days. 03/08/22   Renee Rival, FNP  ezetimibe (ZETIA) 10 MG tablet Take 1 tablet (10 mg total) by mouth daily. 09/10/21   Janina Mayo, MD  glipiZIDE (GLUCOTROL) 10 MG tablet Take 10 mg by mouth 2 (two) times daily before a meal.    [provider]  hydrochlorothiazide (HYDRODIURIL) 25 MG tablet Take 1 tablet (25 mg total) by mouth daily. 09/10/21   Janina Mayo, MD  insulin degludec (TRESIBA FLEXTOUCH) 100 UNIT/ML FlexTouch Pen Inject into the skin. 08/07/19   [provider]  JARDIANCE 10 MG TABS tablet  Take 1 tablet (10 mg total) by mouth every morning. 09/10/21   Janina Mayo, MD  ketoconazole (NIZORAL) 2 % shampoo  11/01/18   [provider]  levothyroxine (SYNTHROID, LEVOTHROID) 88 MCG tablet  11/10/18   [provider]  losartan (COZAAR) 25 MG tablet Take by mouth. 01/31/20 02/26/22  [provider]  MAG64 64 MG TBEC Take 1 tablet by mouth daily. 09/11/18   [provider]  metFORMIN (GLUCOPHAGE) 1000 MG tablet Take 1,000 mg by mouth 2 (two) times daily with a meal.    [provider]  ONETOUCH VERIO test strip USE AS DIRECTED 4 TIMES A DAY 09/22/18   [provider]  pantoprazole (PROTONIX) 40 MG tablet Take 1 tablet (40 mg total) by mouth daily. 03/08/22   [employer], Dewaine Conger, FNP  polyethylene glycol (MIRALAX / GLYCOLAX) 17 g packet Take 17 g by mouth daily.    [provider]  tadalafil (CIALIS) 5 MG tablet Take 5 mg by mouth daily as needed for erectile dysfunction.    [provider]  valACYclovir (VALTREX) 500 MG tablet Take 500 mg by mouth 2 (two) times daily.    [provider]  rosuvastatin (CRESTOR) 5 MG tablet Take 5 mg by mouth daily.  05/12/20  [provider]     Allergies  Mirabegron, Niacin and related, Other, and Statins    Review of Systems   Review of Systems  All other systems reviewed and are negative. Ten systems reviewed and are negative for acute change, except as noted in the HPI.   Physical Exam Updated Vital Signs BP 116/74   Pulse 64   Temp 97.7 F (36.5 C)   Resp 20   Ht '5\' 6"'$  (1.676 m)   Wt 90.7 kg   SpO2 94%   BMI 32.28 kg/m  Physical Exam Vitals and nursing note reviewed.  Constitutional:      General: He is not in acute distress.    Appearance: He is well-developed.  HENT:     Head: Normocephalic and atraumatic.     Right Ear: External ear normal.     Left Ear: External ear normal.  Eyes:     General: No scleral icterus.       Right eye: No discharge.         Left eye: No discharge.     Conjunctiva/sclera: Conjunctivae normal.  Neck:     Trachea: No tracheal deviation.  Cardiovascular:     Rate and Rhythm: Normal rate.  Pulmonary:     Effort: Pulmonary effort is normal. No respiratory distress.     Breath sounds: No stridor.  Abdominal:     General: There is no distension.  Musculoskeletal:        General: No swelling or deformity.     Cervical back: Neck supple.  Skin:    General: Skin is warm and dry.     Findings: No rash.     Comments: Please see images below of the right hand.  Partially amputated distal tip of the right fourth digit.  Small amount of active bleeding that improves with direct pressure.  Palpable radial pulse.  Grip strength intact  Neurological:     Mental Status: He is alert.     Cranial Nerves: Cranial nerve deficit: no gross deficits.       ED Results / Procedures / Treatments   Labs (all labs ordered are listed, but only abnormal results are displayed) Labs Reviewed - No data to display  EKG None  Radiology DG Hand Complete Right  Result Date: 03/09/2022 CLINICAL DATA:  Finger tip amputation. EXAM: RIGHT HAND - COMPLETE 3+ VIEW COMPARISON:  None Available. FINDINGS: Amputation of the ring finger tuft noted. No other acute bony abnormality noted. Severe degenerative changes of the DIP joints, ring finger PIP joint and thumb interphalangeal joint noted. No radiopaque foreign bodies identified. IMPRESSION: 1. Amputation of the ring finger tuft. 2. No other acute bony abnormality. Electronically Signed   By: Margarette Canada M.D.   On: 03/09/2022 15:50    Procedures .Nerve Block  Date/Time: 03/09/2022 3:57 PM Performed by: Rayna Sexton, PA-C Authorized by: Rayna Sexton, PA-C   Consent:    Consent obtained:  Verbal   Consent given by:  Patient   Risks discussed:  Bleeding, pain, unsuccessful block and swelling   Alternatives discussed:  No treatment Universal protocol:    Procedure explained  and questions answered to patient or proxy's satisfaction: yes     Relevant documents present and verified: yes     Test results available: yes     Imaging studies available: yes     Required blood products, implants, devices, and special equipment available: yes     Site/side marked: yes     Immediately prior to procedure, a time out was called:  yes     Patient identity confirmed:  Verbally with patient Indications:    Indications:  Pain relief Location:    Nerve block body site: right ring finger.   Laterality:  Right Pre-procedure details:    Skin preparation:  Alcohol   Preparation: Patient was prepped and draped in usual sterile fashion   Procedure details:    Block needle gauge:  25 G   Anesthetic injected:  Bupivacaine 0.5% w/o epi and lidocaine 1% w/o epi   Steroid injected:  None   Additive injected:  None   Injection procedure:  Anatomic landmarks identified and negative aspiration for blood   Medications Ordered in ED Medications  lidocaine (PF) (XYLOCAINE) 1 % injection 5 mL (5 mLs Infiltration Given 03/09/22 1435)  bupivacaine(PF) (MARCAINE) 0.5 % injection 10 mL (10 mLs Infiltration Given 03/09/22 1434)  Tdap (BOOSTRIX) injection 0.5 mL (0.5 mLs Intramuscular Given 03/09/22 1435)  ceFAZolin (ANCEF) IVPB 2g/100 mL premix (0 g Intravenous Stopped 03/09/22 1535)  povidone-iodine (BETADINE) 10 % external solution (  Given 03/09/22 1557)   ED Course/ Medical Decision Making/ A&P Clinical Course as of 03/09/22 1616  Sat Mar 09, 2022  1556 Digital block performed successfully.  Finger soaking in diluted Betadine.  Patient has received IV Ancef. [LJ]  1558 Patient states that he is already taking doxycycline for recent tick bite.  Will not initiate further oral antibiotics. [LJ]  [id number] Patient discussed with Dr. Greta Doom who is on-call for patient's hand surgeon, Dr. Amedeo Plenty.  Recommends cleaning the wound and wrapping it in a bandage.  Recommends outpatient follow-up. [LJ]    Clinical  Course User Index [LJ] Rayna Sexton, PA-C                           Medical Decision Making Amount and/or Complexity of Data Reviewed Radiology: ordered.  Risk Prescription drug management.   Pt is a 76 y.o. male who is right-hand dominant and presents to the emergency department after amputating the end of his right ring finger with his car door just prior to arrival.  Imaging: X-ray of the right hand shows amputation of the ring finger tuft.  No other acute bony abnormality.  I, Rayna Sexton, PA-C, personally reviewed and evaluated these images and lab results as part of my medical decision-making.  Please see images above of the patient's right hand.  Small amount of bleeding that is improved with direct pressure.  X-ray with findings as noted above.  Patient is followed by Dr. Amedeo Plenty with hand surgery.  I discussed his case with Dr. Greta Doom who is on-call for [facility] and recommends cleaning the wound, wrapping it in a bandage, and having him follow-up outpatient.  This was discussed with the patient and he is amenable.  Tdap was updated in the ED.  Patient given 1 g of IV Ancef.  Wound cleaned extensively.  Patient appears stable for discharge at this time and he is agreeable.  He is already taking a course of doxycycline for recent tick bite so will not prescribe additional antibiotics.  We discussed return precautions in length.  His questions were answered and he was amicable at the time of discharge.  Note: Portions of this report may have been transcribed using voice recognition software. Every effort was made to ensure accuracy; however, inadvertent computerized transcription errors may be present.   Final Clinical Impression(s) / ED Diagnoses Final diagnoses:  Amputation of tip of finger, initial encounter  Rx / DC Orders ED Discharge Orders     None         Rayna Sexton, Hershal Coria 03/09/22 1618    Carmin Muskrat, MD 03/10/22 1348

## 2022-03-09 NOTE — ED Triage Notes (Signed)
Pt reports amputation to distal end of 4th digit on right hand.  Bleeding controlled at this time.

## 2022-03-09 NOTE — Discharge Instructions (Addendum)
Like we discussed, please keep the tip of your finger clean and dry.  I would recommend keeping it covered during the day.  Please apply triple antibiotic to the end of the finger 1-2 times per day.  Please follow-up with your hand doctor, Dr. Amedeo Plenty.  I spoke to his colleague Dr. Greta Doom on the phone today and he recommends that you follow-up in their office for reevaluation.  If you develop any new or worsening symptoms please come back to the emergency department.

## 2022-03-11 ENCOUNTER — Telehealth: Payer: Self-pay | Admitting: *Deleted

## 2022-03-11 NOTE — Telephone Encounter (Signed)
LMOVM to call back to schedule TCS w/ Dr. Abbey Chatters, ASA 3

## 2022-03-12 ENCOUNTER — Encounter: Payer: Self-pay | Admitting: Oncology

## 2022-03-12 ENCOUNTER — Ambulatory Visit (INDEPENDENT_AMBULATORY_CARE_PROVIDER_SITE_OTHER): Payer: Medicare HMO | Admitting: Family Medicine

## 2022-03-12 ENCOUNTER — Encounter: Payer: Self-pay | Admitting: Family Medicine

## 2022-03-12 DIAGNOSIS — S6992XA Unspecified injury of left wrist, hand and finger(s), initial encounter: Secondary | ICD-10-CM | POA: Diagnosis not present

## 2022-03-12 DIAGNOSIS — S6992XS Unspecified injury of left wrist, hand and finger(s), sequela: Secondary | ICD-10-CM

## 2022-03-12 NOTE — Progress Notes (Signed)
Established Patient Office Visit  Subjective:  Patient ID: Ryan Herrera, male    DOB: 12/31/1945  Age: 76 y.o. MRN: [id number]  CC:  Chief Complaint  Patient presents with   Hand Pain    Pt complains of an injury on his left hand (ring finger) on 03/09/2022, finger is wrapped around     HPI Ryan Herrera is a 76 y.o. male with past medical history of presents for evaluation of wrapping of his left ring finger. He was seen in the ED on 03/09/2022 for amputation of the end of the right fourth digit after accidentally slamming his hand into a car door. He is right-hand dominant. He saw Dr. Amedeo Plenty, the hand surgeon, on 03/11/22. His affected hand is wrapped in a bandage, and the patient wants to ensure that the wrap is not too tight. He reports no numbness, weakness and  tingling sensation in the affected hand.   Past Medical History:  Diagnosis Date   Arthritis    hands   Coronary artery disease    Diabetes mellitus without complication ([name])    GERD (gastroesophageal reflux disease)    H/O poliomyelitis    age 65.  now has "weak stomach muscles"   Hypertension    IBS (irritable bowel syndrome)    Iron deficiency anemia 09/22/2018   Renal insufficiency    Thyroid disease     Past Surgical History:  Procedure Laterality Date   BACK SURGERY  1967   benign tumor removal  2004   CORONARY ARTERY BYPASS GRAFT  2008   [facility] - 3 vessel   CYSTOSCOPY WITH INSERTION OF UROLIFT     HERNIA REPAIR Bilateral    inguinal   HYDROCELE EXCISION / REPAIR     JOINT REPLACEMENT     left knee x2, rright x1   TONSILLECTOMY      Family History  Problem Relation Age of Onset   Hypertension Mother    Heart attack Mother    Osteoarthritis Mother    Stroke Father    Hypertension Father    Stroke Sister    Stroke Sister    Colon cancer Neg Hx    Prostate cancer Neg Hx    Cancer - Lung Neg Hx     Social History   Socioeconomic History   Marital status: Divorced     Spouse name: Not on file   Number of children: 1   Years of education: 12   Highest education level: Master's degree (e.g., MA, MS, MEng, MEd, MSW, MBA)  Occupational History   Occupation: retired  Tobacco Use   Smoking status: Former    Packs/day: 1.00    Years: 38.00    Pack years: 38.00    Types: Cigarettes    Quit date: 12/1996    Years since quitting: 25.2   Smokeless tobacco: Never  Vaping Use   Vaping Use: Never used  Substance and Sexual Activity   Alcohol use: Yes    Comment: occassionally   Drug use: No   Sexual activity: Not Currently  Other Topics Concern   Not on file  Social History Narrative   Lives with his significant other, pt is retired    Investment banker, operational of Radio broadcast assistant Strain: Low Risk    Difficulty of Paying Living Expenses: Not hard at all  Food Insecurity: No Food Insecurity   Worried About Charity fundraiser in the Last Year: Never true   [employer] of  Food in the Last Year: Never true  Transportation Needs: No Transportation Needs   Lack of Transportation (Medical): No   Lack of Transportation (Non-Medical): No  Physical Activity: Inactive   Days of Exercise per Week: 0 days   Minutes of Exercise per Session: 0 min  Stress: No Stress Concern Present   Feeling of Stress : Not at all  Social Connections: Socially Isolated   Frequency of Communication with Friends and Family: More than three times a week   Frequency of Social Gatherings with Friends and Family: Once a week   Attends Religious Services: Never   Marine scientist or Organizations: No   Attends Music therapist: Never   Marital Status: Divorced  Human resources officer Violence: Not At Risk   Fear of Current or Ex-Partner: No   Emotionally Abused: No   Physically Abused: No   Sexually Abused: No    Outpatient Medications Prior to Visit  Medication Sig Dispense Refill   allopurinol (ZYLOPRIM) 100 MG tablet Take 1 tablet (100 mg total) by mouth  daily. 30 tablet 3   aspirin EC 81 MG tablet Take 81 mg by mouth daily.     B-12 MICROLOZENGE 500 MCG SUBL DISSOLVE 2 TABLETS UNDER THE TONGUE ONCE DAILY  11   BD PEN NEEDLE NANO U/F 32G X 4 MM MISC      doxycycline (VIBRAMYCIN) 100 MG capsule doxycycline hyclate 100 mg capsule  TAKE 1 CAPSULE BY MOUTH TWICE A DAY FOR 30 DAYS     Evolocumab (REPATHA SURECLICK) 902 MG/ML SOAJ Inject 140 mg into the skin every 14 (fourteen) days. 2 mL 3   ezetimibe (ZETIA) 10 MG tablet Take 1 tablet (10 mg total) by mouth daily. 90 tablet 3   glipiZIDE (GLUCOTROL) 10 MG tablet Take 10 mg by mouth 2 (two) times daily before a meal.     hydrochlorothiazide (HYDRODIURIL) 25 MG tablet Take 1 tablet (25 mg total) by mouth daily. 90 tablet 3   insulin degludec (TRESIBA FLEXTOUCH) 100 UNIT/ML FlexTouch Pen Inject into the skin.     JARDIANCE 10 MG TABS tablet Take 1 tablet (10 mg total) by mouth every morning. 30 tablet 3   ketoconazole (NIZORAL) 2 % shampoo      levothyroxine (SYNTHROID, LEVOTHROID) 88 MCG tablet      MAG64 64 MG TBEC Take 1 tablet by mouth daily.     metFORMIN (GLUCOPHAGE) 1000 MG tablet Take 1,000 mg by mouth 2 (two) times daily with a meal.     ONETOUCH VERIO test strip USE AS DIRECTED 4 TIMES A DAY     pantoprazole (PROTONIX) 40 MG tablet Take 1 tablet (40 mg total) by mouth daily. 90 tablet 3   polyethylene glycol (MIRALAX / GLYCOLAX) 17 g packet Take 17 g by mouth daily.     tadalafil (CIALIS) 5 MG tablet Take 5 mg by mouth daily as needed for erectile dysfunction.     valACYclovir (VALTREX) 500 MG tablet Take 500 mg by mouth 2 (two) times daily.     losartan (COZAAR) 25 MG tablet Take by mouth.     No facility-administered medications prior to visit.    Allergies  Allergen Reactions   Mirabegron Other (See Comments)   Niacin And Related Itching   Other     PT does not want OPIOIDS     Statins Itching    ROS Review of Systems  Constitutional:  Negative for activity change, fever  and unexpected weight change.  Musculoskeletal:  Negative for arthralgias and joint swelling.  Skin:  Negative for color change and pallor.       Wants wrap on left finger examined     Objective:    Physical Exam Constitutional:      Appearance: Normal appearance.  Cardiovascular:     Rate and Rhythm: Normal rate and regular rhythm.     Pulses: Normal pulses.     Heart sounds: Normal heart sounds.  Skin:    Comments:   Palpable radial pulse.   Grip strength intact Left right finger is wrapped securely, with no signs of compartment syndrome noted.   Neurological:     Mental Status: He is alert.    BP 122/82   Pulse 70   Ht 5' 6.5" (1.689 m)   Wt 200 lb 9.6 oz (91 kg)   SpO2 98%   BMI 31.89 kg/m  Wt Readings from Last 3 Encounters:  03/12/22 200 lb 9.6 oz (91 kg)  03/09/22 200 lb (90.7 kg)  03/08/22 201 lb (91.2 kg)    Lab Results  Component Value Date   TSH 4.124 12/21/2018   Lab Results  Component Value Date   WBC 4.5 02/27/2021   HGB 13.7 02/27/2021   HCT 40.2 02/27/2021   MCV 91.2 02/27/2021   PLT 183 02/27/2021   Lab Results  Component Value Date   NA 136 02/27/2021   K 3.8 02/27/2021   CO2 25 02/27/2021   GLUCOSE 177 (H) 02/27/2021   BUN 17 02/27/2021   CREATININE 0.97 02/27/2021   BILITOT 0.8 02/27/2021   ALKPHOS 55 02/27/2021   AST 18 02/27/2021   ALT 19 02/27/2021   PROT 6.7 02/27/2021   ALBUMIN 3.7 02/27/2021   CALCIUM 9.0 02/27/2021   ANIONGAP 11 02/27/2021   No results found for: CHOL No results found for: HDL No results found for: LDLCALC No results found for: TRIG No results found for: CHOLHDL Lab Results  Component Value Date   HGBA1C 8.4 08/14/2021      Assessment & Plan:   Problem List Items Addressed This Visit       Other   Injury of finger of left hand    -Bandage is sung, not tight -The 5's of circulation intact -No numbness, weakness, and  tingling sensation in the affected hand        No orders of the  defined types were placed in this encounter.   Follow-up: No follow-ups on file.    Alvira Monday, FNP

## 2022-03-12 NOTE — Patient Instructions (Signed)
I appreciate the opportunity to provide care to you today!   Check if you have compartment syndrome pain in a muscle - this may feel like a burning pain or a deep ache (moving the body part can make the pain even worse) swelling or bulging of the muscle. numbness, weakness or pins and needles. tightness or difficulty moving the affected body part.         It was a pleasure to see you and I look forward to continuing to work together on your health and well-being. Please do not hesitate to call the office if you need care or have questions about your care.   Have a wonderful day and week. With Gratitude, Alvira Monday MSN, FNP-BC

## 2022-03-12 NOTE — Assessment & Plan Note (Signed)
-  Bandage is sung, not tight -The 5's of circulation intact -No numbness, weakness, and  tingling sensation in the affected hand

## 2022-03-12 NOTE — Telephone Encounter (Signed)
Spoke to pt, he got his hand caught in a metal door over the weekend and will be seeing a hand specialist weekly. He wants to hold off on TCS at this time. Advised him to call office when he is able to proceed with TCS.

## 2022-03-13 ENCOUNTER — Other Ambulatory Visit: Payer: Self-pay | Admitting: Nurse Practitioner

## 2022-03-13 ENCOUNTER — Telehealth: Payer: Self-pay

## 2022-03-13 DIAGNOSIS — E039 Hypothyroidism, unspecified: Secondary | ICD-10-CM

## 2022-03-13 LAB — CMP14+EGFR
ALT: 14 IU/L (ref 0–44)
AST: 15 IU/L (ref 0–40)
Albumin/Globulin Ratio: 1.8 (ref 1.2–2.2)
Albumin: 3.9 g/dL (ref 3.7–4.7)
Alkaline Phosphatase: 64 IU/L (ref 44–121)
BUN/Creatinine Ratio: 18 (ref 10–24)
BUN: 19 mg/dL (ref 8–27)
Bilirubin Total: 0.3 mg/dL (ref 0.0–1.2)
CO2: 20 mmol/L (ref 20–29)
Calcium: 9.1 mg/dL (ref 8.6–10.2)
Chloride: 102 mmol/L (ref 96–106)
Creatinine, Ser: 1.05 mg/dL (ref 0.76–1.27)
Globulin, Total: 2.2 g/dL (ref 1.5–4.5)
Glucose: 73 mg/dL (ref 70–99)
Potassium: 3.8 mmol/L (ref 3.5–5.2)
Sodium: 139 mmol/L (ref 134–144)
Total Protein: 6.1 g/dL (ref 6.0–8.5)
eGFR: 73 mL/min/{1.73_m2} (ref >59)

## 2022-03-13 LAB — LIPID PANEL
Chol/HDL Ratio: 3.1 ratio (ref 0.0–5.0)
Cholesterol, Total: 120 mg/dL (ref 100–199)
HDL: 39 mg/dL — ABNORMAL LOW (ref >39)
LDL Chol Calc (NIH): 63 mg/dL (ref 0–99)
Triglycerides: 94 mg/dL (ref 0–149)
VLDL Cholesterol Cal: 18 mg/dL (ref 5–40)

## 2022-03-13 LAB — TSH: TSH: 5.76 u[IU]/mL — ABNORMAL HIGH (ref 0.450–4.500)

## 2022-03-13 MED ORDER — LEVOTHYROXINE SODIUM 100 MCG PO TABS: 100.0000 ug | ORAL_TABLET | Freq: Every day | ORAL | 0 refills | Status: DC

## 2022-03-13 NOTE — Progress Notes (Signed)
TSH level is high , start taking levothyroxine 191mg daily , follw up in 6 weeks to recheck labs, kindly schedule a follow up appointment in 6 weeks, pt should have labs done 3-5 days before that appointment.  Other labs are stable

## 2022-03-13 NOTE — Telephone Encounter (Signed)
Patient returning a call said nurse left no specific details left just said please call office back.

## 2022-03-13 NOTE — Telephone Encounter (Signed)
error 

## 2022-03-14 ENCOUNTER — Telehealth: Payer: Self-pay | Admitting: Nurse Practitioner

## 2022-03-14 ENCOUNTER — Other Ambulatory Visit: Payer: Self-pay | Admitting: Nurse Practitioner

## 2022-03-14 ENCOUNTER — Ambulatory Visit (INDEPENDENT_AMBULATORY_CARE_PROVIDER_SITE_OTHER): Payer: Medicare HMO

## 2022-03-14 DIAGNOSIS — E119 Type 2 diabetes mellitus without complications: Secondary | ICD-10-CM | POA: Diagnosis not present

## 2022-03-14 DIAGNOSIS — E1169 Type 2 diabetes mellitus with other specified complication: Secondary | ICD-10-CM

## 2022-03-14 LAB — POCT GLYCOSYLATED HEMOGLOBIN (HGB A1C): Hemoglobin A1C: 7.6 % — AB (ref 4.0–5.6)

## 2022-03-14 NOTE — Telephone Encounter (Signed)
Pt done finger stick in office

## 2022-03-14 NOTE — Telephone Encounter (Signed)
Please advise 

## 2022-03-14 NOTE — Telephone Encounter (Signed)
done

## 2022-03-14 NOTE — Progress Notes (Signed)
A1C much improved from 8.4 to 7.6. continue current meds and follow up with nephrology as planned

## 2022-03-14 NOTE — Telephone Encounter (Signed)
Pt called stating after he was given his lab results yeasterday, he went on his mychart & realized the A1C was not in his labs ordered. He states he has endocrine appt next week & they are wanting to know his A1C is? Can you please give a call to pt in regards to this? If he does not answer he said to please leave a detailed message

## 2022-03-15 ENCOUNTER — Telehealth: Payer: Self-pay | Admitting: Nurse Practitioner

## 2022-03-15 NOTE — Telephone Encounter (Signed)
Spoke with pt

## 2022-03-15 NOTE — Telephone Encounter (Signed)
Pt returning call, pt does have it documented to leave a detailed message

## 2022-03-19 ENCOUNTER — Ambulatory Visit (INDEPENDENT_AMBULATORY_CARE_PROVIDER_SITE_OTHER): Payer: Medicare HMO | Admitting: "Endocrinology

## 2022-03-19 ENCOUNTER — Other Ambulatory Visit: Payer: Self-pay | Admitting: Nurse Practitioner

## 2022-03-19 ENCOUNTER — Encounter: Payer: Self-pay | Admitting: "Endocrinology

## 2022-03-19 VITALS — BP 104/70 | HR 84 | Ht 66.5 in | Wt 198.8 lb

## 2022-03-19 DIAGNOSIS — Z6831 Body mass index (BMI) 31.0-31.9, adult: Secondary | ICD-10-CM

## 2022-03-19 DIAGNOSIS — E039 Hypothyroidism, unspecified: Secondary | ICD-10-CM

## 2022-03-19 DIAGNOSIS — E6609 Other obesity due to excess calories: Secondary | ICD-10-CM

## 2022-03-19 DIAGNOSIS — I10 Essential (primary) hypertension: Secondary | ICD-10-CM | POA: Diagnosis not present

## 2022-03-19 DIAGNOSIS — E782 Mixed hyperlipidemia: Secondary | ICD-10-CM | POA: Insufficient documentation

## 2022-03-19 DIAGNOSIS — E1159 Type 2 diabetes mellitus with other circulatory complications: Secondary | ICD-10-CM | POA: Diagnosis not present

## 2022-03-19 MED ORDER — FREESTYLE PRECISION NEO TEST VI STRP: ORAL_STRIP | 2 refills | Status: DC

## 2022-03-19 MED ORDER — GLIPIZIDE ER 5 MG PO TB24: 5.0000 mg | ORAL_TABLET | Freq: Every day | ORAL | 3 refills | Status: DC

## 2022-03-19 NOTE — Progress Notes (Signed)
Endocrinology Consult Note       03/19/2022, 2:43 PM   Subjective:    Patient ID: Ryan Herrera, male    DOB: November 30, 1945.  Ryan Herrera is being seen in consultation for management of currently uncontrolled symptomatic diabetes requested by  Renee Rival, FNP.   Past Medical History:  Diagnosis Date   Arthritis    hands   Coronary artery disease    Diabetes mellitus without complication ([name])    GERD (gastroesophageal reflux disease)    H/O poliomyelitis    age 76.  now has "weak stomach muscles"   Hypertension    IBS (irritable bowel syndrome)    Iron deficiency anemia 09/22/2018   Renal insufficiency    Thyroid disease     Past Surgical History:  Procedure Laterality Date   BACK SURGERY  1967   benign tumor removal  2004   CORONARY ARTERY BYPASS GRAFT  2008   [facility] - 3 vessel   CYSTOSCOPY WITH INSERTION OF UROLIFT     HERNIA REPAIR Bilateral    inguinal   HYDROCELE EXCISION / REPAIR     JOINT REPLACEMENT     left knee x2, rright x1   PROSTATE SURGERY N/A 01/2021   TONSILLECTOMY      Social History   Socioeconomic History   Marital status: Divorced    Spouse name: Not on file   Number of children: 1   Years of education: 12   Highest education level: Master's degree (e.g., MA, MS, MEng, MEd, MSW, MBA)  Occupational History   Occupation: retired  Tobacco Use   Smoking status: Former    Packs/day: 1.00    Years: 38.00    Total pack years: 38.00    Types: Cigarettes    Quit date: 12/1996    Years since quitting: 25.3   Smokeless tobacco: Never  Vaping Use   Vaping Use: Never used  Substance and Sexual Activity   Alcohol use: Yes    Comment: occassionally   Drug use: No   Sexual activity: Not Currently  Other Topics Concern   Not on file  Social History Narrative   Lives with his significant other, pt is retired    International aid/development worker Strain: [place]  (11/08/2021)   Overall Financial Resource Strain (CARDIA)    Difficulty of Paying Living Expenses: Not hard at all  Food Insecurity: No Food Insecurity (11/08/2021)   Hunger Vital Sign    Worried About Running Out of Food in the Last Year: Never true    [place] in the Last Year: Never true  Transportation Needs: No Transportation Needs (11/08/2021)   PRAPARE - Hydrologist (Medical): No    Lack of Transportation (Non-Medical): No  Physical Activity: Inactive (11/08/2021)   Exercise Vital Sign    Days of Exercise per Week: 0 days    Minutes of Exercise per Session: 0 min  Stress: No Stress Concern Present (11/08/2021)   [place]    Feeling of Stress : Not  at all  Social Connections: Socially Isolated (11/08/2021)   Social Connection and Isolation Panel [NHANES]    Frequency of Communication with Friends and Family: More than three times a week    Frequency of Social Gatherings with Friends and Family: Once a week    Attends Religious Services: Never    Marine scientist or Organizations: No    Attends Music therapist: Never    Marital Status: Divorced    Family History  Problem Relation Age of Onset   Hypertension Mother    Heart attack Mother    Osteoarthritis Mother    Stroke Father    Hypertension Father    Stroke Sister    Stroke Sister    Colon cancer Neg Hx    Prostate cancer Neg Hx    Cancer - Lung Neg Hx     Outpatient Encounter Medications as of 03/19/2022  Medication Sig   glipiZIDE (GLUCOTROL XL) 5 MG 24 hr tablet Take 1 tablet (5 mg total) by mouth daily with breakfast.   glucose blood (FREESTYLE PRECISION NEO TEST) test strip Use to test glucose 2 times daily.   allopurinol (ZYLOPRIM) 100 MG tablet Take 1 tablet (100 mg total) by mouth daily.   aspirin EC 81 MG tablet Take 81 mg by mouth daily.   B-12  MICROLOZENGE 500 MCG SUBL DISSOLVE 2 TABLETS UNDER THE TONGUE ONCE DAILY   BD PEN NEEDLE NANO U/F 32G X 4 MM MISC    doxycycline (VIBRAMYCIN) 100 MG capsule doxycycline hyclate 100 mg capsule  TAKE 1 CAPSULE BY MOUTH TWICE A DAY FOR 30 DAYS   Evolocumab (REPATHA SURECLICK) 962 MG/ML SOAJ Inject 140 mg into the skin every 14 (fourteen) days.   ezetimibe (ZETIA) 10 MG tablet Take 1 tablet (10 mg total) by mouth daily.   hydrochlorothiazide (HYDRODIURIL) 25 MG tablet Take 1 tablet (25 mg total) by mouth daily.   insulin degludec (TRESIBA FLEXTOUCH) 100 UNIT/ML FlexTouch Pen Inject 20 Units into the skin at bedtime.   JARDIANCE 10 MG TABS tablet Take 1 tablet (10 mg total) by mouth every morning.   ketoconazole (NIZORAL) 2 % shampoo    levothyroxine (SYNTHROID) 100 MCG tablet Take 1 tablet (100 mcg total) by mouth daily before breakfast.   losartan (COZAAR) 25 MG tablet Take by mouth.   MAG64 64 MG TBEC Take 1 tablet by mouth daily.   metFORMIN (GLUCOPHAGE) 1000 MG tablet Take 1,000 mg by mouth 2 (two) times daily with a meal.   ONETOUCH VERIO test strip USE AS DIRECTED 4 TIMES A DAY   pantoprazole (PROTONIX) 40 MG tablet Take 1 tablet (40 mg total) by mouth daily.   polyethylene glycol (MIRALAX / GLYCOLAX) 17 g packet Take 17 g by mouth daily as needed.   tadalafil (CIALIS) 5 MG tablet Take 5 mg by mouth daily.   valACYclovir (VALTREX) 500 MG tablet Take 500 mg by mouth 2 (two) times daily.   [DISCONTINUED] glipiZIDE (GLUCOTROL) 10 MG tablet Take 10 mg by mouth 2 (two) times daily before a meal.   [DISCONTINUED] rosuvastatin (CRESTOR) 5 MG tablet Take 5 mg by mouth daily.   No facility-administered encounter medications on file as of 03/19/2022.    ALLERGIES: Allergies  Allergen Reactions   Mirabegron Other (See Comments)   Niacin And Related Itching   Other     PT does not want OPIOIDS     Statins Itching    VACCINATION STATUS: Immunization History  Administered Date(s)  Administered    Influenza Split 07/19/2013   Influenza, High Dose Seasonal PF 06/13/2015, 07/18/2016, 06/11/2017, 07/08/2018, 07/02/2019, 09/15/2020, 07/27/2021   Influenza,inj,Quad PF,6+ Mos 08/08/2014   Influenza-Unspecified 07/15/2012, 07/15/2013, 08/08/2014, 06/13/2015, 07/18/2016   PFIZER Comirnaty(Gray Top)Covid-19 Tri-Sucrose Vaccine 11/27/2019, 12/19/2019, 07/26/2020, 04/20/2021   PPD Test 01/22/2013, 01/22/2013   Pfizer Covid-19 Vaccine Bivalent Booster 26yr & up 07/27/2021   Pneumococcal Conjugate-13 05/04/2014   Pneumococcal Polysaccharide-23 07/15/2012   Td 02/15/2003   Tdap 05/04/2014, 03/09/2022   Zoster Recombinat (Shingrix) 02/12/2017, 02/13/2017, 06/04/2017    Diabetes He presents for his initial diabetic visit. He has type 2 diabetes mellitus. Onset time: He was diagnosed at approximate age of 89years. There are no hypoglycemic associated symptoms. Pertinent negatives for hypoglycemia include no confusion, headaches, pallor or seizures. Associated symptoms include polydipsia and polyuria. Pertinent negatives for diabetes include no chest pain, no fatigue, no polyphagia and no weakness. There are no hypoglycemic complications. Symptoms are improving. Diabetic complications include heart disease. Risk factors for coronary artery disease include dyslipidemia, diabetes mellitus, family history, obesity, male sex, hypertension, tobacco exposure and sedentary lifestyle. Current diabetic treatment includes insulin injections (He is currently on Tresiba 30 units daily, metformin 1000 mg p.o. twice daily, glipizide 10 mg p.o. twice daily, Jardiance 10 mg p.o. once a day.). His weight is fluctuating minimally. He is following a generally unhealthy diet. When asked about meal planning, he reported none. He rarely participates in exercise. His home blood glucose trend is decreasing steadily. His overall blood glucose range is 140-180 mg/dl. (His previsit labs show A1c of 7.6%, average blood glucose of 168  in his meter.) An ACE inhibitor/angiotensin II receptor blocker is being taken.  Hyperlipidemia This is a chronic problem. The current episode started more than 1 year ago. The problem is controlled. Exacerbating diseases include diabetes and obesity. Pertinent negatives include no chest pain, myalgias or shortness of breath. Treatments tried: He is on Repatha injection as well as Zetia 10 mg p.o. daily. Risk factors for coronary artery disease include dyslipidemia, diabetes mellitus, family history, obesity, male sex, hypertension and a sedentary lifestyle.  Hypertension This is a chronic problem. The current episode started more than 1 year ago. Pertinent negatives include no chest pain, headaches, neck pain, palpitations or shortness of breath. Risk factors for coronary artery disease include family history, dyslipidemia, diabetes mellitus, male gender, obesity and sedentary lifestyle. Past treatments include angiotensin blockers. Hypertensive end-organ damage includes CAD/MI.     Review of Systems  Constitutional:  Negative for chills, fatigue, fever and unexpected weight change.  HENT:  Negative for dental problem, mouth sores and trouble swallowing.   Eyes:  Negative for visual disturbance.  Respiratory:  Negative for cough, choking, chest tightness, shortness of breath and wheezing.   Cardiovascular:  Negative for chest pain, palpitations and leg swelling.  Gastrointestinal:  Negative for abdominal distention, abdominal pain, constipation, diarrhea, nausea and vomiting.  Endocrine: Positive for polydipsia and polyuria. Negative for polyphagia.  Genitourinary:  Negative for dysuria, flank pain, hematuria and urgency.  Musculoskeletal:  Negative for back pain, gait problem, myalgias and neck pain.  Skin:  Negative for pallor, rash and wound.  Neurological:  Negative for seizures, syncope, weakness, numbness and headaches.  Psychiatric/Behavioral:  Negative for confusion and dysphoric mood.      Objective:       03/19/2022    1:27 PM 03/12/2022   11:38 AM 03/09/2022    3:45 PM  Vitals with BMI  Height 5' 6.5" 5' 6.5"  Weight 198 lbs 13 oz 200 lbs 10 oz   BMI 21.30 86.5   Systolic 784 696   Diastolic 70 82   Pulse 84 70 64    BP 104/70   Pulse 84   Ht 5' 6.5" (1.689 m)   Wt 198 lb 12.8 oz (90.2 kg)   BMI 31.61 kg/m   Wt Readings from Last 3 Encounters:  03/19/22 198 lb 12.8 oz (90.2 kg)  03/12/22 200 lb 9.6 oz (91 kg)  03/09/22 200 lb (90.7 kg)     Physical Exam Constitutional:      General: He is not in acute distress.    Appearance: He is well-developed.  HENT:     Head: Normocephalic and atraumatic.  Neck:     Thyroid: No thyromegaly.     Trachea: No tracheal deviation.  Cardiovascular:     Rate and Rhythm: Normal rate.     Pulses:          Dorsalis pedis pulses are 1+ on the right side and 1+ on the left side.       Posterior tibial pulses are 1+ on the right side and 1+ on the left side.     Heart sounds: Normal heart sounds, S1 normal and S2 normal. No murmur heard.    No gallop.  Pulmonary:     Effort: Pulmonary effort is normal. No respiratory distress.     Breath sounds: No wheezing.  Abdominal:     General: Bowel sounds are normal. There is no distension.     Tenderness: There is no abdominal tenderness. There is no guarding.  Musculoskeletal:     Right shoulder: No swelling or deformity.     Cervical back: Normal range of motion and neck supple.  Skin:    General: Skin is warm and dry.     Findings: No rash.     Nails: There is no clubbing.  Neurological:     Mental Status: He is alert and oriented to person, place, and time.     Cranial Nerves: No cranial nerve deficit.     Sensory: No sensory deficit.     Gait: Gait normal.     Deep Tendon Reflexes: Reflexes are normal and symmetric.  Psychiatric:        Speech: Speech normal.        Behavior: Behavior normal. Behavior is cooperative.        Thought Content: Thought content  normal.        Judgment: Judgment normal.     Comments: Reluctant affect.     CMP ( most recent) CMP     Component Value Date/Time   NA 139 03/12/2022 1310   NA 139 04/29/2014 1500   K 3.8 03/12/2022 1310   K 4.2 04/29/2014 1500   CL 102 03/12/2022 1310   CL 103 04/29/2014 1500   CO2 20 03/12/2022 1310   CO2 27 04/29/2014 1500   GLUCOSE 73 03/12/2022 1310   GLUCOSE 177 (H) 02/27/2021 1338   GLUCOSE 111 (H) 04/29/2014 1500   BUN 19 03/12/2022 1310   BUN 16 04/29/2014 1500   CREATININE 1.05 03/12/2022 1310   CREATININE 1.14 04/29/2014 1500   CALCIUM 9.1 03/12/2022 1310   CALCIUM 9.2 04/29/2014 1500   PROT 6.1 03/12/2022 1310   ALBUMIN 3.9 03/12/2022 1310   AST 15 03/12/2022 1310   ALT 14 03/12/2022 1310   ALKPHOS 64 03/12/2022 1310   BILITOT 0.3 03/12/2022 1310   GFRNONAA >60 02/27/2021  [place] 04/29/2014 1500   GFRAA >60 05/12/2020 1848   GFRAA >60 04/29/2014 1500     Diabetic Labs (most recent): Lab Results  Component Value Date   HGBA1C 7.6 (A) 03/14/2022   HGBA1C 8.4 08/14/2021     Lipid Panel ( most recent) Lipid Panel     Component Value Date/Time   CHOL 120 03/12/2022 1310   TRIG 94 03/12/2022 1310   HDL 39 (L) 03/12/2022 1310   CHOLHDL 3.1 03/12/2022 1310   LDLCALC 63 03/12/2022 1310   LABVLDL 18 03/12/2022 1310      Lab Results  Component Value Date   TSH 5.760 (H) 03/12/2022   TSH 4.124 12/21/2018   TSH 5.545 (H) 09/22/2018   TSH 4.84 (H) 04/29/2014   FREET4 1.01 12/21/2018      Assessment & Plan:   1. DM type 2 causing vascular disease ([place])  - Ryan Herrera has currently uncontrolled symptomatic type 2 DM since  76 years of age,  with most recent A1c of 7.6 %. Recent labs reviewed. His average blood glucose of 168 in his meter. - I had a long discussion with him about the progressive nature of diabetes and the pathology behind its complications. -his diabetes is complicated by coronary artery disease status  post cardiac bypass in 2008, obesity/sedentary life, comorbid hypertension /hyperlipidemia, history of smoking and he remains at a high risk for more acute and chronic complications which include CAD, CVA, CKD, retinopathy, and neuropathy. These are all discussed in detail with him.  - I discussed all available options of managing his diabetes including de-escalation of medications. I have counseled him on diet  and weight management  by adopting a Whole Food , Plant Predominant  ( WFPP) nutrition as recommended by [employer] of Lifestyle Medicine. Patient is encouraged to switch to  unprocessed or minimally processed  complex starch, adequate protein intake (mainly plant source), minimal liquid fat ( mainly vegetable oils), plenty of fruits, and vegetables. -  he is advised to stick to a routine mealtimes to eat 3 complete meals a day and snack only when necessary ( to snack only to correct hypoglycemia BG <70 day time or <100 at night).   - he acknowledges that there is a room for improvement in his food and drink choices. - Further Specific Suggestion is made for him to avoid simple carbohydrates  from his diet including Cakes, Sweet Desserts, Ice Cream, Soda (diet and regular), Sweet Tea, Candies, Chips, Cookies, Store Bought Juices, Alcohol ,  Artificial Sweeteners,  Coffee Creamer, and "Sugar-free" Products. This will help patient to have more stable blood glucose profile and potentially avoid unintended weight gain.  The following Lifestyle Medicine recommendations according to [facility]) were discussed and offered to patient and he agrees to start the journey:  A. Whole Foods, Plant-based plate comprising of fruits and vegetables, plant-based proteins, whole-grain carbohydrates was discussed in detail with the patient.   A list for source of those nutrients were also provided to the patient.  Patient will use only water or unsweetened tea for hydration. B.   The need to stay away from risky substances including alcohol, smoking; obtaining 7 to 9 hours of restorative sleep, at least 150 minutes of moderate intensity exercise weekly, the importance of healthy social connections,  and stress reduction techniques were discussed. C.  A full color page of  Calorie density of various food groups per pound showing examples of  each food groups was provided to the patient.  - he will be scheduled with Jearld Fenton, RDN, CDE for individualized diabetes education.  - I have approached him with the following plan to manage  his diabetes and patient agrees:   -Based on his presentation, he will not need prandial insulin for now.  He is concerned about nocturnal hypoglycemia, advised to lower his Tresiba to 20 units and switch to nightly associated with monitoring of blood glucose twice a day-daily before breakfast and at bedtime.  He is advised to continue metformin 1000 mg p.o. twice daily, lower glipizide to 5 mg XL p.o. daily at breakfast, and continue Jardiance 10 mg p.o. once a day at breakfast.  - he is warned not to take insulin without proper monitoring per orders.  - he is encouraged to call clinic for blood glucose levels less than 70 or above 150 mg /dl fasting x3/week.   - Specific targets for  A1c;  LDL, HDL,  and Triglycerides were discussed with the patient.  2) Blood Pressure /Hypertension:  his blood pressure is  controlled to target.   he is advised to continue his current medications including losartan 25 mg p.o. daily with breakfast . 3) Lipids/Hyperlipidemia:   Review of his recent lipid panel showed  controlled  LDL at 63 .  He has statin intolerance.  He is advised to continue Repatha 140 mg every 14 days, Zetia 10 mg every night.   Side effects and precautions discussed with him.  4)  Weight/Diet:  Body mass index is 31.61 kg/m.  -   clearly complicating his diabetes care.   he is  a candidate for weight loss. I discussed with him the  fact that loss of 5 - 10% of his  current body weight will have the most impact on his diabetes management.  The above detailed  ACLM recommendations for nutrition, exercise, sleep, social life, avoidance of risky substances, the need for restorative sleep   information will also detailed on discharge instructions.  5) Chronic Care/Health Maintenance:  -he  is on ARB  medications and  is encouraged to initiate and continue to follow up with Ophthalmology, Dentist,  Podiatrist at least yearly or according to recommendations, and advised to   stay away from smoking. I have recommended yearly flu vaccine and pneumonia vaccine at least every 5 years; moderate intensity exercise for up to 150 minutes weekly; and  sleep for 7- 9 hours a day.   6) hypothyroidism: Circumstance of diagnosis not available to review.  His recent TSH was slightly above target.  He is currently on levothyroxine 100 mcg p.o. daily before breakfast.  He is advised to continue.  - he is  advised to maintain close follow up with Renee Rival, FNP for primary care needs, as well as his other providers for optimal and coordinated care.   I spent 65 minutes in the care of the patient today including review of labs from [facility], Lipids, Thyroid Function, Hematology (current and previous including abstractions from other facilities); face-to-face time discussing  his blood glucose readings/logs, discussing hypoglycemia and hyperglycemia episodes and symptoms, medications doses, his options of short and long term treatment based on the latest standards of care / guidelines;  discussion about incorporating lifestyle medicine;  and documenting the encounter.     Please refer to Patient Instructions for Blood Glucose Monitoring and Insulin/Medications Dosing Guide"  in media tab for additional information. Please  also refer to " Patient Self  Inventory" in the Media  tab for reviewed elements of pertinent patient history.  Ryan Herrera participated in the discussions, expressed understanding, and voiced agreement with the above plans.  All questions were answered to his satisfaction. he is encouraged to contact clinic should he have any questions or concerns prior to his return visit.   Follow up plan: - Return in about 3 months (around 06/19/2022) for Bring Meter/CGM Device/Logs- A1c in Office.  Glade Lloyd, MD [facility] [address] [place], [place] [address] Phone: [phone]  Fax: [phone]    03/19/2022, 2:43 PM  This note was partially dictated with voice recognition software. Similar sounding words can be transcribed inadequately or may not  be corrected upon review.

## 2022-03-19 NOTE — Patient Instructions (Signed)

## 2022-03-20 ENCOUNTER — Other Ambulatory Visit: Payer: Self-pay | Admitting: "Endocrinology

## 2022-03-22 ENCOUNTER — Emergency Department (HOSPITAL_COMMUNITY): Payer: Medicare HMO

## 2022-03-22 ENCOUNTER — Other Ambulatory Visit: Payer: Self-pay | Admitting: "Endocrinology

## 2022-03-22 ENCOUNTER — Other Ambulatory Visit: Payer: Self-pay

## 2022-03-22 ENCOUNTER — Encounter (HOSPITAL_COMMUNITY): Payer: Self-pay | Admitting: Emergency Medicine

## 2022-03-22 ENCOUNTER — Ambulatory Visit
Admission: EM | Admit: 2022-03-22 | Discharge: 2022-03-22 | Disposition: A | Discharge status: Home / Self Care | Payer: Medicare HMO | Attending: Nurse Practitioner | Admitting: Nurse Practitioner

## 2022-03-22 ENCOUNTER — Inpatient Hospital Stay (HOSPITAL_COMMUNITY)
Admission: EM | Admit: 2022-03-22 | Discharge: 2022-04-01 | DRG: 234 | Disposition: A | Discharge status: Home / Self Care | Payer: Medicare HMO | Attending: Thoracic Surgery (Cardiothoracic Vascular Surgery) | Admitting: Thoracic Surgery (Cardiothoracic Vascular Surgery)

## 2022-03-22 ENCOUNTER — Telehealth: Payer: Self-pay | Admitting: "Endocrinology

## 2022-03-22 ENCOUNTER — Encounter: Payer: Self-pay | Admitting: Emergency Medicine

## 2022-03-22 DIAGNOSIS — Z8249 Family history of ischemic heart disease and other diseases of the circulatory system: Secondary | ICD-10-CM

## 2022-03-22 DIAGNOSIS — I9751 Accidental puncture and laceration of a circulatory system organ or structure during a circulatory system procedure: Secondary | ICD-10-CM | POA: Diagnosis not present

## 2022-03-22 DIAGNOSIS — I25118 Atherosclerotic heart disease of native coronary artery with other forms of angina pectoris: Secondary | ICD-10-CM | POA: Diagnosis present

## 2022-03-22 DIAGNOSIS — I25718 Atherosclerosis of autologous vein coronary artery bypass graft(s) with other forms of angina pectoris: Secondary | ICD-10-CM | POA: Diagnosis present

## 2022-03-22 DIAGNOSIS — Z87891 Personal history of nicotine dependence: Secondary | ICD-10-CM

## 2022-03-22 DIAGNOSIS — D62 Acute posthemorrhagic anemia: Secondary | ICD-10-CM | POA: Diagnosis not present

## 2022-03-22 DIAGNOSIS — I77810 Thoracic aortic ectasia: Secondary | ICD-10-CM | POA: Diagnosis present

## 2022-03-22 DIAGNOSIS — Z8612 Personal history of poliomyelitis: Secondary | ICD-10-CM

## 2022-03-22 DIAGNOSIS — N179 Acute kidney failure, unspecified: Secondary | ICD-10-CM | POA: Diagnosis present

## 2022-03-22 DIAGNOSIS — I2584 Coronary atherosclerosis due to calcified coronary lesion: Secondary | ICD-10-CM | POA: Diagnosis present

## 2022-03-22 DIAGNOSIS — E11649 Type 2 diabetes mellitus with hypoglycemia without coma: Secondary | ICD-10-CM

## 2022-03-22 DIAGNOSIS — Z951 Presence of aortocoronary bypass graft: Secondary | ICD-10-CM

## 2022-03-22 DIAGNOSIS — E1159 Type 2 diabetes mellitus with other circulatory complications: Secondary | ICD-10-CM

## 2022-03-22 DIAGNOSIS — J9382 Other air leak: Secondary | ICD-10-CM | POA: Diagnosis not present

## 2022-03-22 DIAGNOSIS — E119 Type 2 diabetes mellitus without complications: Secondary | ICD-10-CM | POA: Diagnosis present

## 2022-03-22 DIAGNOSIS — E039 Hypothyroidism, unspecified: Secondary | ICD-10-CM | POA: Diagnosis present

## 2022-03-22 DIAGNOSIS — I10 Essential (primary) hypertension: Secondary | ICD-10-CM | POA: Diagnosis present

## 2022-03-22 DIAGNOSIS — R109 Unspecified abdominal pain: Secondary | ICD-10-CM | POA: Diagnosis not present

## 2022-03-22 DIAGNOSIS — I2510 Atherosclerotic heart disease of native coronary artery without angina pectoris: Secondary | ICD-10-CM | POA: Diagnosis not present

## 2022-03-22 DIAGNOSIS — K660 Peritoneal adhesions (postprocedural) (postinfection): Secondary | ICD-10-CM | POA: Diagnosis present

## 2022-03-22 DIAGNOSIS — M79671 Pain in right foot: Secondary | ICD-10-CM | POA: Diagnosis present

## 2022-03-22 DIAGNOSIS — Z888 Allergy status to other drugs, medicaments and biological substances status: Secondary | ICD-10-CM | POA: Diagnosis not present

## 2022-03-22 DIAGNOSIS — Z20822 Contact with and (suspected) exposure to covid-19: Secondary | ICD-10-CM | POA: Diagnosis present

## 2022-03-22 DIAGNOSIS — Y838 Other surgical procedures as the cause of abnormal reaction of the patient, or of later complication, without mention of misadventure at the time of the procedure: Secondary | ICD-10-CM | POA: Diagnosis not present

## 2022-03-22 DIAGNOSIS — I252 Old myocardial infarction: Secondary | ICD-10-CM | POA: Diagnosis not present

## 2022-03-22 DIAGNOSIS — E874 Mixed disorder of acid-base balance: Secondary | ICD-10-CM | POA: Diagnosis not present

## 2022-03-22 DIAGNOSIS — M79672 Pain in left foot: Secondary | ICD-10-CM | POA: Diagnosis present

## 2022-03-22 DIAGNOSIS — R0789 Other chest pain: Secondary | ICD-10-CM

## 2022-03-22 DIAGNOSIS — Z79890 Hormone replacement therapy (postmenopausal): Secondary | ICD-10-CM

## 2022-03-22 DIAGNOSIS — Z01810 Encounter for preprocedural cardiovascular examination: Secondary | ICD-10-CM | POA: Diagnosis not present

## 2022-03-22 DIAGNOSIS — M1990 Unspecified osteoarthritis, unspecified site: Secondary | ICD-10-CM | POA: Diagnosis present

## 2022-03-22 DIAGNOSIS — I25119 Atherosclerotic heart disease of native coronary artery with unspecified angina pectoris: Secondary | ICD-10-CM | POA: Diagnosis not present

## 2022-03-22 DIAGNOSIS — Z7982 Long term (current) use of aspirin: Secondary | ICD-10-CM | POA: Diagnosis not present

## 2022-03-22 DIAGNOSIS — N400 Enlarged prostate without lower urinary tract symptoms: Secondary | ICD-10-CM | POA: Diagnosis present

## 2022-03-22 DIAGNOSIS — W232XXD Caught, crushed, jammed or pinched between a moving and stationary object, subsequent encounter: Secondary | ICD-10-CM | POA: Diagnosis present

## 2022-03-22 DIAGNOSIS — Z7984 Long term (current) use of oral hypoglycemic drugs: Secondary | ICD-10-CM | POA: Diagnosis not present

## 2022-03-22 DIAGNOSIS — K219 Gastro-esophageal reflux disease without esophagitis: Secondary | ICD-10-CM | POA: Diagnosis present

## 2022-03-22 DIAGNOSIS — Z87442 Personal history of urinary calculi: Secondary | ICD-10-CM

## 2022-03-22 DIAGNOSIS — Z794 Long term (current) use of insulin: Secondary | ICD-10-CM | POA: Diagnosis not present

## 2022-03-22 DIAGNOSIS — E7800 Pure hypercholesterolemia, unspecified: Secondary | ICD-10-CM

## 2022-03-22 DIAGNOSIS — I25110 Atherosclerotic heart disease of native coronary artery with unstable angina pectoris: Secondary | ICD-10-CM | POA: Diagnosis not present

## 2022-03-22 DIAGNOSIS — Z823 Family history of stroke: Secondary | ICD-10-CM

## 2022-03-22 DIAGNOSIS — E8770 Fluid overload, unspecified: Secondary | ICD-10-CM | POA: Diagnosis not present

## 2022-03-22 DIAGNOSIS — Z96653 Presence of artificial knee joint, bilateral: Secondary | ICD-10-CM | POA: Diagnosis present

## 2022-03-22 DIAGNOSIS — Z79899 Other long term (current) drug therapy: Secondary | ICD-10-CM

## 2022-03-22 DIAGNOSIS — J9811 Atelectasis: Secondary | ICD-10-CM | POA: Diagnosis not present

## 2022-03-22 DIAGNOSIS — I440 Atrioventricular block, first degree: Secondary | ICD-10-CM | POA: Diagnosis present

## 2022-03-22 DIAGNOSIS — I214 Non-ST elevation (NSTEMI) myocardial infarction: Secondary | ICD-10-CM | POA: Diagnosis present

## 2022-03-22 DIAGNOSIS — S68124D Partial traumatic metacarpophalangeal amputation of right ring finger, subsequent encounter: Secondary | ICD-10-CM

## 2022-03-22 DIAGNOSIS — Z886 Allergy status to analgesic agent status: Secondary | ICD-10-CM

## 2022-03-22 LAB — CBC
HCT: 44.9 % (ref 39.0–52.0)
Hemoglobin: 14.8 g/dL (ref 13.0–17.0)
MCH: 30.7 pg (ref 26.0–34.0)
MCHC: 33 g/dL (ref 30.0–36.0)
MCV: 93.2 fL (ref 80.0–100.0)
Platelets: 232 10*3/uL (ref 150–400)
RBC: 4.82 MIL/uL (ref 4.22–5.81)
RDW: 13.8 % (ref 11.5–15.5)
WBC: 6.8 10*3/uL (ref 4.0–10.5)
nRBC: 0 % (ref 0.0–0.2)

## 2022-03-22 LAB — HEPATIC FUNCTION PANEL
ALT: 22 U/L (ref 0–44)
AST: 23 U/L (ref 15–41)
Albumin: 3.9 g/dL (ref 3.5–5.0)
Alkaline Phosphatase: 57 U/L (ref 38–126)
Bilirubin, Direct: 0.1 mg/dL (ref 0.0–0.2)
Indirect Bilirubin: 0.4 mg/dL (ref 0.3–0.9)
Total Bilirubin: 0.5 mg/dL (ref 0.3–1.2)
Total Protein: 7.3 g/dL (ref 6.5–8.1)

## 2022-03-22 LAB — BASIC METABOLIC PANEL
Anion gap: 8 (ref 5–15)
BUN: 25 mg/dL — ABNORMAL HIGH (ref 8–23)
CO2: 24 mmol/L (ref 22–32)
Calcium: 9.5 mg/dL (ref 8.9–10.3)
Chloride: 104 mmol/L (ref 98–111)
Creatinine, Ser: 1.13 mg/dL (ref 0.61–1.24)
GFR, Estimated: >60 mL/min (ref >60)
Glucose, Bld: 216 mg/dL — ABNORMAL HIGH (ref 70–99)
Potassium: 4.2 mmol/L (ref 3.5–5.1)
Sodium: 136 mmol/L (ref 135–145)

## 2022-03-22 LAB — PROTIME-INR
INR: 1 (ref 0.8–1.2)
Prothrombin Time: 12.7 seconds (ref 11.4–15.2)

## 2022-03-22 LAB — APTT: aPTT: 28 seconds (ref 24–36)

## 2022-03-22 LAB — CBG MONITORING, ED: Glucose-Capillary: 82 mg/dL (ref 70–99)

## 2022-03-22 LAB — TROPONIN I (HIGH SENSITIVITY)
Troponin I (High Sensitivity): 283 ng/L (ref <18)
Troponin I (High Sensitivity): 421 ng/L (ref <18)

## 2022-03-22 MED ORDER — LEVOTHYROXINE SODIUM 100 MCG PO TABS
100.0000 ug | ORAL_TABLET | Freq: Every day | ORAL | Status: DC
  Administered 2022-03-23 – 2022-03-27 (×5): 100 ug via ORAL
  Filled 2022-03-22: qty 2
  Filled 2022-03-22 (×5): qty 1

## 2022-03-22 MED ORDER — SODIUM CHLORIDE 0.9 % WEIGHT BASED INFUSION
3.0000 mL/kg/h | INTRAVENOUS | Status: DC
  Administered 2022-03-25: 3 mL/kg/h via INTRAVENOUS

## 2022-03-22 MED ORDER — EMPAGLIFLOZIN 10 MG PO TABS
10.0000 mg | ORAL_TABLET | Freq: Every morning | ORAL | Status: DC
  Administered 2022-03-23 – 2022-03-27 (×5): 10 mg via ORAL
  Filled 2022-03-22 (×8): qty 1

## 2022-03-22 MED ORDER — PANTOPRAZOLE SODIUM 40 MG PO TBEC
40.0000 mg | DELAYED_RELEASE_TABLET | Freq: Every day | ORAL | Status: DC
  Administered 2022-03-23 – 2022-03-27 (×5): 40 mg via ORAL
  Filled 2022-03-22 (×5): qty 1

## 2022-03-22 MED ORDER — HEPARIN BOLUS VIA INFUSION
4000.0000 [IU] | Freq: Once | INTRAVENOUS | Status: AC
  Administered 2022-03-22: 4000 [IU] via INTRAVENOUS

## 2022-03-22 MED ORDER — INSULIN ASPART 100 UNIT/ML IJ SOLN
0.0000 [IU] | Freq: Three times a day (TID) | INTRAMUSCULAR | Status: DC
  Administered 2022-03-23: 3 [IU] via SUBCUTANEOUS
  Administered 2022-03-23: 5 [IU] via SUBCUTANEOUS
  Administered 2022-03-24: 2 [IU] via SUBCUTANEOUS
  Administered 2022-03-24 (×2): 3 [IU] via SUBCUTANEOUS
  Administered 2022-03-25: 2 [IU] via SUBCUTANEOUS
  Administered 2022-03-25 – 2022-03-26 (×2): 3 [IU] via SUBCUTANEOUS
  Administered 2022-03-26: 5 [IU] via SUBCUTANEOUS
  Administered 2022-03-26: 2 [IU] via SUBCUTANEOUS
  Administered 2022-03-27 (×2): 3 [IU] via SUBCUTANEOUS
  Administered 2022-03-27: 2 [IU] via SUBCUTANEOUS

## 2022-03-22 MED ORDER — EZETIMIBE 10 MG PO TABS
10.0000 mg | ORAL_TABLET | Freq: Every day | ORAL | Status: DC
  Administered 2022-03-23 – 2022-03-27 (×5): 10 mg via ORAL
  Filled 2022-03-22 (×5): qty 1

## 2022-03-22 MED ORDER — INSULIN DEGLUDEC 100 UNIT/ML ~~LOC~~ SOPN: 20.0000 [IU] | PEN_INJECTOR | Freq: Every day | SUBCUTANEOUS | Status: DC

## 2022-03-22 MED ORDER — HYDROCHLOROTHIAZIDE 25 MG PO TABS
25.0000 mg | ORAL_TABLET | Freq: Every day | ORAL | Status: DC
  Administered 2022-03-23: 25 mg via ORAL
  Filled 2022-03-22: qty 1

## 2022-03-22 MED ORDER — SODIUM CHLORIDE 0.9 % WEIGHT BASED INFUSION
1.0000 mL/kg/h | INTRAVENOUS | Status: DC
  Administered 2022-03-25: 1 mL/kg/h via INTRAVENOUS

## 2022-03-22 MED ORDER — NITROGLYCERIN 0.4 MG SL SUBL
0.4000 mg | SUBLINGUAL_TABLET | SUBLINGUAL | Status: DC | PRN
  Administered 2022-03-22: 0.4 mg via SUBLINGUAL

## 2022-03-22 MED ORDER — ACETAMINOPHEN 325 MG PO TABS
650.0000 mg | ORAL_TABLET | ORAL | Status: DC | PRN
  Administered 2022-03-23 – 2022-03-24 (×4): 650 mg via ORAL
  Filled 2022-03-22 (×4): qty 2

## 2022-03-22 MED ORDER — MORPHINE SULFATE (PF) 2 MG/ML IV SOLN: 1.0000 mg | INTRAVENOUS | Status: DC | PRN

## 2022-03-22 MED ORDER — HEPARIN (PORCINE) 25000 UT/250ML-% IV SOLN
1350.0000 [IU]/h | INTRAVENOUS | Status: DC
  Administered 2022-03-22 – 2022-03-25 (×4): 1150 [IU]/h via INTRAVENOUS
  Filled 2022-03-22 (×4): qty 250

## 2022-03-22 MED ORDER — SODIUM CHLORIDE 0.9 % IV SOLN: 250.0000 mL | INTRAVENOUS | Status: DC | PRN

## 2022-03-22 MED ORDER — SODIUM CHLORIDE 0.9% FLUSH: 3.0000 mL | INTRAVENOUS | Status: DC | PRN

## 2022-03-22 MED ORDER — ONDANSETRON HCL 4 MG/2ML IJ SOLN: 4.0000 mg | Freq: Four times a day (QID) | INTRAMUSCULAR | Status: DC | PRN

## 2022-03-22 MED ORDER — LOSARTAN POTASSIUM 25 MG PO TABS
25.0000 mg | ORAL_TABLET | Freq: Every day | ORAL | Status: DC
  Administered 2022-03-23 – 2022-03-24 (×2): 25 mg via ORAL
  Filled 2022-03-22 (×2): qty 1

## 2022-03-22 MED ORDER — INSULIN GLARGINE-YFGN 100 UNIT/ML ~~LOC~~ SOLN
20.0000 [IU] | Freq: Every day | SUBCUTANEOUS | Status: DC
  Administered 2022-03-22 – 2022-03-24 (×2): 20 [IU] via SUBCUTANEOUS
  Filled 2022-03-22 (×4): qty 0.2

## 2022-03-22 MED ORDER — NITROGLYCERIN 0.4 MG SL SUBL
0.4000 mg | SUBLINGUAL_TABLET | SUBLINGUAL | Status: DC | PRN
Start: 2022-03-22 — End: 2022-03-28
  Administered 2022-03-22 (×2): 0.4 mg via SUBLINGUAL
  Filled 2022-03-22 (×2): qty 1

## 2022-03-22 MED ORDER — NITROGLYCERIN IN D5W 200-5 MCG/ML-% IV SOLN
5.0000 ug/min | INTRAVENOUS | Status: DC
  Administered 2022-03-23: 25 ug/min via INTRAVENOUS
  Administered 2022-03-23: 5 ug/min via INTRAVENOUS
  Administered 2022-03-24: 30 ug/min via INTRAVENOUS
  Administered 2022-03-25 – 2022-03-26 (×3): 65 ug/min via INTRAVENOUS
  Administered 2022-03-27: 19.5 ug/min via INTRAVENOUS
  Filled 2022-03-22 (×8): qty 250

## 2022-03-22 MED ORDER — LEVOTHYROXINE SODIUM 50 MCG PO TABS: 100.0000 ug | ORAL_TABLET | Freq: Every day | ORAL | Status: DC

## 2022-03-22 MED ORDER — ALLOPURINOL 100 MG PO TABS
100.0000 mg | ORAL_TABLET | Freq: Every day | ORAL | Status: DC
  Filled 2022-03-22: qty 1

## 2022-03-22 MED ORDER — ASPIRIN 81 MG PO TBEC
81.0000 mg | DELAYED_RELEASE_TABLET | Freq: Every day | ORAL | Status: DC
  Administered 2022-03-22 – 2022-03-27 (×6): 81 mg via ORAL
  Filled 2022-03-22 (×6): qty 1

## 2022-03-22 MED ORDER — SODIUM CHLORIDE 0.9% FLUSH
3.0000 mL | Freq: Two times a day (BID) | INTRAVENOUS | Status: DC
  Administered 2022-03-23 – 2022-03-27 (×4): 3 mL via INTRAVENOUS

## 2022-03-22 NOTE — ED Notes (Signed)
Pt placed on 2L oxygen by [place] for comfort

## 2022-03-22 NOTE — ED Notes (Signed)
Pt reported onset of Chest Pain to this RN. RN paged Cardiology to advise on treatment, and informed EDP.

## 2022-03-22 NOTE — Progress Notes (Signed)
ANTICOAGULATION CONSULT NOTE - Initial Consult  Pharmacy Consult for Heparin Indication: chest pain/ACS  Allergies  Allergen Reactions   Mirabegron Other (See Comments)   Niacin And Related Itching   Other     PT does not want OPIOIDS     Statins Itching   Patient Measurements: Height: 5' 6.5" (168.9 cm) Weight: 89.8 kg (198 lb) IBW/kg (Calculated) : 64.95 HEPARIN DW (KG): 83.8  Vital Signs: Temp: 97.6 F (36.4 C) (06/16 1500) Temp Source: Oral (06/16 1500) BP: 126/80 (06/16 1630) Pulse Rate: 73 (06/16 1635)  Labs: Recent Labs    03/22/22 1517  HGB 14.8  HCT 44.9  PLT 232  CREATININE 1.13  TROPONINIHS 283*    Estimated Creatinine Clearance: 57.1 mL/min (by C-G formula based on SCr of 1.13 mg/dL).  Medical History: Past Medical History:  Diagnosis Date   Arthritis    hands   Coronary artery disease    Diabetes mellitus without complication ([name])    GERD (gastroesophageal reflux disease)    H/O poliomyelitis    age 76.  now has "weak stomach muscles"   Hypertension    IBS (irritable bowel syndrome)    Iron deficiency anemia 09/22/2018   Renal insufficiency    Thyroid disease     Medications:  (Not in a hospital admission)   Assessment: Pt to ER states he has been having "spells" of chest tightness, SHOB, and left arm pain.  Pt states has become more frequent.  Pt was seen [facility] at [facility] and was sent here for furhter evaluation.  Pt not on anticoagulants PTA.  Goal of Therapy:  Heparin level 0.3-0.7 units/ml Monitor platelets by anticoagulation protocol: Yes   Plan:  Heparin bolus 4000 units x 1 then infusion at 1150 units/hr Check HL in 8 hours Monitor CBC, s/sx bleeding complications  Hart Robinsons A 03/22/2022,4:48 PM

## 2022-03-22 NOTE — ED Notes (Signed)
Pt refused second IV at this time.

## 2022-03-22 NOTE — ED Notes (Signed)
Per request, RN to call family and friend listed in his chart when Pt is finally transported to [facility]

## 2022-03-22 NOTE — Telephone Encounter (Signed)
Patient called and states he was told to call if his fasting sugar was 150 or above in the morning. He said his readings were fine until insulin was recently changed at last appointment. Pt aware we do not have provider in office today.   6/14 80  6/15 150  6/16 176  [phone]

## 2022-03-22 NOTE — ED Notes (Signed)
Patient is being discharged from the Urgent Care and sent to the Emergency Department via private vehcile . Per PA, patient is in need of higher level of care due to chest pain and SOB. Patient is aware and verbalizes understanding of plan of care.  Vitals:   03/22/22 1417  BP: (!) 150/85  Pulse: 76  Resp: 18  Temp: 97.8 F (36.6 C)  SpO2: 96%

## 2022-03-22 NOTE — ED Notes (Signed)
RN spoke with Cardiology and received verbal order to trial NTG tablet X 3 to relieve chest pain prior to beginning NTG Drip IV.

## 2022-03-22 NOTE — ED Provider Notes (Signed)
[facility] EMERGENCY DEPARTMENT Provider Note   CSN: [id number] Arrival date & time: 03/22/22  1452     History {Add pertinent medical, surgical, social history, OB history to HPI:1} Chief Complaint  Patient presents with   Chest Pain    Ryan Herrera is a 76 y.o. male.  Patient has a history of diabetes hypertension and coronary artery disease.  He had bypass surgery done in 08.  Patient states that he has been having chest pain off and on for the last 2 weeks running down his left arm.  It seems to be coming more often  The history is provided by the patient and medical records. No language interpreter was used.  Chest Pain Pain location:  L chest Pain quality: aching   Pain radiates to:  L arm Pain severity:  Moderate Onset quality:  Sudden Timing:  Intermittent Progression:  Waxing and waning Chronicity:  New Context: not breathing   Relieved by:  Nothing Worsened by:  Nothing Ineffective treatments:  None tried Associated symptoms: no abdominal pain, no back pain, no cough, no fatigue and no headache        Home Medications Prior to Admission medications   Medication Sig Start Date End Date Taking? Authorizing Provider  allopurinol (ZYLOPRIM) 100 MG tablet Take 1 tablet (100 mg total) by mouth daily. 10/22/21   Renee Rival, FNP  aspirin EC 81 MG tablet Take 81 mg by mouth daily.    [provider]  B-12 MICROLOZENGE 500 MCG SUBL DISSOLVE 2 TABLETS UNDER THE TONGUE ONCE DAILY 07/14/18   [provider]  BD PEN NEEDLE NANO U/F 32G X 4 MM MISC  08/26/18   [provider]  doxycycline (VIBRAMYCIN) 100 MG capsule doxycycline hyclate 100 mg capsule  TAKE 1 CAPSULE BY MOUTH TWICE A DAY FOR 30 DAYS    [provider]  Evolocumab (REPATHA SURECLICK) 160 MG/ML SOAJ Inject 140 mg into the skin every 14 (fourteen) days. 03/08/22   Renee Rival, FNP  ezetimibe (ZETIA) 10 MG tablet Take 1 tablet (10 mg total) by mouth  daily. 09/10/21   Janina Mayo, MD  glipiZIDE (GLUCOTROL XL) 5 MG 24 hr tablet TAKE 1 TABLET BY MOUTH EVERY DAY WITH BREAKFAST 03/22/22   Cassandria Anger, MD  glucose blood (FREESTYLE PRECISION NEO TEST) test strip Use to test glucose 2 times daily. 03/19/22   Cassandria Anger, MD  hydrochlorothiazide (HYDRODIURIL) 25 MG tablet Take 1 tablet (25 mg total) by mouth daily. 09/10/21   Janina Mayo, MD  insulin degludec (TRESIBA FLEXTOUCH) 100 UNIT/ML FlexTouch Pen Inject 20 Units into the skin at bedtime. 03/20/22   Cassandria Anger, MD  JARDIANCE 10 MG TABS tablet Take 1 tablet (10 mg total) by mouth every morning. 09/10/21   Janina Mayo, MD  ketoconazole (NIZORAL) 2 % shampoo  11/01/18   [provider]  levothyroxine (SYNTHROID) 100 MCG tablet TAKE 1 TABLET BY MOUTH DAILY BEFORE BREAKFAST. 03/20/22   Renee Rival, FNP  losartan (COZAAR) 25 MG tablet Take by mouth. 01/31/20 02/26/22  [provider]  MAG64 64 MG TBEC Take 1 tablet by mouth daily. 09/11/18   [provider]  metFORMIN (GLUCOPHAGE) 1000 MG tablet Take 1,000 mg by mouth 2 (two) times daily with a meal.    [provider]  ONETOUCH VERIO test strip USE AS DIRECTED 4 TIMES A DAY 09/22/18   [provider]  pantoprazole (PROTONIX) 40 MG tablet  Take 1 tablet (40 mg total) by mouth daily. 03/08/22   Paseda, Dewaine Conger, FNP  polyethylene glycol (MIRALAX / GLYCOLAX) 17 g packet Take 17 g by mouth daily as needed.    [provider]  tadalafil (CIALIS) 5 MG tablet Take 5 mg by mouth daily.    [provider]  valACYclovir (VALTREX) 500 MG tablet Take 500 mg by mouth 2 (two) times daily.    [provider]  rosuvastatin (CRESTOR) 5 MG tablet Take 5 mg by mouth daily.  05/12/20  [provider]      Allergies    Mirabegron, Niacin and related, Other, and Statins    Review of Systems   Review of Systems  Constitutional:  Negative for appetite  change and fatigue.  HENT:  Negative for congestion, ear discharge and sinus pressure.   Eyes:  Negative for discharge.  Respiratory:  Negative for cough.   Cardiovascular:  Positive for chest pain.  Gastrointestinal:  Negative for abdominal pain and diarrhea.  Genitourinary:  Negative for frequency and hematuria.  Musculoskeletal:  Negative for back pain.  Skin:  Negative for rash.  Neurological:  Negative for seizures and headaches.  Psychiatric/Behavioral:  Negative for hallucinations.     Physical Exam Updated Vital Signs BP 124/76   Pulse 74   Temp 97.6 F (36.4 C) (Oral)   Resp 15   Ht 5' 6.5" (1.689 m)   Wt 89.8 kg   SpO2 96%   BMI 31.48 kg/m  Physical Exam Vitals and nursing note reviewed.  Constitutional:      Appearance: He is well-developed.  HENT:     Head: Normocephalic.     Nose: Nose normal.  Eyes:     General: No scleral icterus.    Conjunctiva/sclera: Conjunctivae normal.  Neck:     Thyroid: No thyromegaly.  Cardiovascular:     Rate and Rhythm: Normal rate and regular rhythm.     Heart sounds: No murmur heard.    No friction rub. No gallop.  Pulmonary:     Breath sounds: No stridor. No wheezing or rales.  Chest:     Chest wall: No tenderness.  Abdominal:     General: There is no distension.     Tenderness: There is no abdominal tenderness. There is no rebound.  Musculoskeletal:        General: Normal range of motion.     Cervical back: Neck supple.  Lymphadenopathy:     Cervical: No cervical adenopathy.  Skin:    Findings: No erythema or rash.  Neurological:     Mental Status: He is alert and oriented to person, place, and time.     Motor: No abnormal muscle tone.     Coordination: Coordination normal.  Psychiatric:        Behavior: Behavior normal.     ED Results / Procedures / Treatments   Labs (all labs ordered are listed, but only abnormal results are displayed) Labs Reviewed  BASIC METABOLIC PANEL - Abnormal; Notable for the  following components:      Result Value   Glucose, Bld 216 (*)    BUN 25 (*)    All other components within normal limits  TROPONIN I (HIGH SENSITIVITY) - Abnormal; Notable for the following components:   Troponin I (High Sensitivity) 283 (*)    All other components within normal limits  CBC  HEPATIC FUNCTION PANEL  APTT  PROTIME-INR  HEPARIN LEVEL (UNFRACTIONATED)  TROPONIN I (HIGH SENSITIVITY)  EKG None  Radiology DG Chest 2 View  Result Date: 03/22/2022 CLINICAL DATA:  Chest pain in a 76 year old male. EXAM: CHEST - 2 VIEW COMPARISON:  October 02, 2020. FINDINGS: Post median sternotomy for CABG. EKG leads project over the chest. Cardiomediastinal contours and hilar structures are stable. Hazy opacity at the LEFT lung base. Increased since previous imaging. No sign of lobar consolidation. The no signs of pneumothorax or pleural effusion. On limited assessment there is no acute skeletal finding. IMPRESSION: Hazy opacity at the LEFT lung base, may represent atelectasis, or scarring with increase. Could also be exacerbated by prominence of pre pericardial fat. Developing infection in this area is however not entirely excluded. Suggest follow-up outside of the acute setting to assess for any changes. Electronically Signed   By: Zetta Bills M.D.   On: 03/22/2022 15:59    Procedures Procedures  {Document cardiac monitor, telemetry assessment procedure when appropriate:1}  Medications Ordered in ED Medications  heparin ADULT infusion 100 units/mL (25000 units/284m) (1,150 Units/hr Intravenous New Bag/Given 03/22/22 1712)  heparin bolus via infusion 4,000 Units (4,000 Units Intravenous Bolus from Bag 03/22/22 1713)    ED Course/ Medical Decision Making/ A&P  CRITICAL CARE Performed by: JMilton FergusonTotal critical care time: 40 minutes Critical care time was exclusive of separately billable procedures and treating other patients. Critical care was necessary to treat or prevent  imminent or life-threatening deterioration. Critical care was time spent personally by me on the following activities: development of treatment plan with patient and/or surrogate as well as nursing, discussions with consultants, evaluation of patient's response to treatment, examination of patient, obtaining history from patient or surrogate, ordering and performing treatments and interventions, ordering and review of laboratory studies, ordering and review of radiographic studies, pulse oximetry and re-evaluation of patient's condition.   Patient with chest discomfort and elevated troponin.  He will be started on heparin and I have consulted cardiology who will [facility] the patient to [facility]/or Complexity of Data Reviewed Labs: ordered.  Risk Prescription drug management. Decision regarding hospitalization.   Patient with an NSTEMI.  He is started on heparin and admitted by cardiology  {Document critical care time when appropriate:1} {Document review of labs and clinical decision tools ie heart score, Chads2Vasc2 etc:1}  {Document your independent review of radiology images, and any outside records:1} {Document your discussion with family members, caretakers, and with consultants:1} {Document social determinants of health affecting pt's care:1} {Document your decision making why or why not admission, treatments were needed:1} Final Clinical Impression(s) / ED Diagnoses Final diagnoses:  NSTEMI (non-ST elevated myocardial infarction) ([facility]utter    Rx / DC Orders ED Discharge Orders     None

## 2022-03-22 NOTE — H&P (Signed)
Cardiology Admission History and Physical:   Patient ID: Ryan Herrera MRN: [id number]; DOB: [birth year]   Admission date: 03/22/2022  PCP:  Renee Rival, FNP   [facility] Providers Cardiologist:  Janina Mayo, MD       Chief Complaint:  Chest Pain  Patient Profile:   Ryan Herrera is a 76 y.o. male with past medical history of CAD (s/p CABG in 09/2007 at [facility], normal NST in 12/2012), HTN, HLD, Type 2 DM and prior tobacco use who is being seen 03/22/2022 for the evaluation of an NSTEMI at the request of Dr. Roderic Palau.  History of Present Illness:   [name] was last evaluated by Dr. Phineas Inches in 09/2021 and denied any recent anginal symptoms at that time. No changes were made to his medication regimen and he was continued on ASA 81 mg daily, Repatha, Zetia, HCTZ 25 mg daily, Jardiance 10 mg daily and Losartan 25 mg daily.  He presented to [facility] this afternoon for evaluation of worsening chest pain, dyspnea on exertion and left arm pain for the past few weeks and was sent to the Emergency Department for further evaluation. Was previously evaluated at Urgent Care on 02/21/2022 for similar symptoms and symptoms were felt to be secondary to acid reflux at that time as his pain improved with a GI cocktail.   The pt says that he has had intermittent chest pressure over the lst few weeks   Comes and goes   Some SOB   ONe spell woke him from sleep  The pt and his wife say he has been under marked increased stress over the last several weeks    Insurance problems   Working on a deck that isnt finished Broke L wrist in APril     Initial labs show WBC 6.8, Hgb 14.8, platelets 232, Na+ 136, K+ 4.2 and creatinine 1.13. Glucose 216. Initial Hs Troponin 283 with repeat values pending. CXR shows a hazy opacity at the left lung base which may represent atelectasis or scarring but infection not excluded. EKG shows normal sinus rhythm, heart rate 75  with first-degree AV block and no acute ST changes.  Past Medical History:  Diagnosis Date   Arthritis    hands   Coronary artery disease    Diabetes mellitus without complication ([name])    GERD (gastroesophageal reflux disease)    H/O poliomyelitis    age 63.  now has "weak stomach muscles"   Hypertension    IBS (irritable bowel syndrome)    Iron deficiency anemia 09/22/2018   Renal insufficiency    Thyroid disease     Past Surgical History:  Procedure Laterality Date   BACK SURGERY  1967   benign tumor removal  2004   CORONARY ARTERY BYPASS GRAFT  2008   [facility] - 3 vessel   CYSTOSCOPY WITH INSERTION OF UROLIFT     HERNIA REPAIR Bilateral    inguinal   HYDROCELE EXCISION / REPAIR     JOINT REPLACEMENT     left knee x2, rright x1   PROSTATE SURGERY N/A 01/2021   TONSILLECTOMY       Medications Prior to Admission: Prior to Admission medications   Medication Sig Start Date End Date Taking? Authorizing Provider  allopurinol (ZYLOPRIM) 100 MG tablet Take 1 tablet (100 mg total) by mouth daily. 10/22/21   Renee Rival, FNP  aspirin EC 81 MG tablet Take 81 mg by mouth daily.    [provider]  B-12 MICROLOZENGE 500 MCG SUBL DISSOLVE 2 TABLETS UNDER THE TONGUE ONCE DAILY 07/14/18   [provider]  BD PEN NEEDLE NANO U/F 32G X 4 MM MISC  08/26/18   [provider]  doxycycline (VIBRAMYCIN) 100 MG capsule doxycycline hyclate 100 mg capsule  TAKE 1 CAPSULE BY MOUTH TWICE A DAY FOR 30 DAYS    [provider]  Evolocumab (REPATHA SURECLICK) 062 MG/ML SOAJ Inject 140 mg into the skin every 14 (fourteen) days. 03/08/22   Renee Rival, FNP  ezetimibe (ZETIA) 10 MG tablet Take 1 tablet (10 mg total) by mouth daily. 09/10/21   Janina Mayo, MD  glipiZIDE (GLUCOTROL XL) 5 MG 24 hr tablet TAKE 1 TABLET BY MOUTH EVERY DAY WITH BREAKFAST 03/22/22   Cassandria Anger, MD  glucose blood (FREESTYLE PRECISION NEO TEST) test strip Use to test  glucose 2 times daily. 03/19/22   Cassandria Anger, MD  hydrochlorothiazide (HYDRODIURIL) 25 MG tablet Take 1 tablet (25 mg total) by mouth daily. 09/10/21   Janina Mayo, MD  insulin degludec (TRESIBA FLEXTOUCH) 100 UNIT/ML FlexTouch Pen Inject 20 Units into the skin at bedtime. 03/20/22   Cassandria Anger, MD  JARDIANCE 10 MG TABS tablet Take 1 tablet (10 mg total) by mouth every morning. 09/10/21   Janina Mayo, MD  ketoconazole (NIZORAL) 2 % shampoo  11/01/18   [provider]  levothyroxine (SYNTHROID) 100 MCG tablet TAKE 1 TABLET BY MOUTH DAILY BEFORE BREAKFAST. 03/20/22   Renee Rival, FNP  losartan (COZAAR) 25 MG tablet Take by mouth. 01/31/20 02/26/22  [provider]  MAG64 64 MG TBEC Take 1 tablet by mouth daily. 09/11/18   [provider]  metFORMIN (GLUCOPHAGE) 1000 MG tablet Take 1,000 mg by mouth 2 (two) times daily with a meal.    [provider]  ONETOUCH VERIO test strip USE AS DIRECTED 4 TIMES A DAY 09/22/18   [provider]  pantoprazole (PROTONIX) 40 MG tablet Take 1 tablet (40 mg total) by mouth daily. 03/08/22   Paseda, Dewaine Conger, FNP  polyethylene glycol (MIRALAX / GLYCOLAX) 17 g packet Take 17 g by mouth daily as needed.    [provider]  tadalafil (CIALIS) 5 MG tablet Take 5 mg by mouth daily.    [provider]  valACYclovir (VALTREX) 500 MG tablet Take 500 mg by mouth 2 (two) times daily.    [provider]  rosuvastatin (CRESTOR) 5 MG tablet Take 5 mg by mouth daily.  05/12/20  [provider]     Allergies:    Allergies  Allergen Reactions   Mirabegron Other (See Comments)   Niacin And Related Itching   Other     PT does not want OPIOIDS     Statins Itching    Social History:   Social History   Socioeconomic History   Marital status: Divorced    Spouse name: Not on file   Number of children: 1   Years of education: 12   Highest education level: Master's degree  (e.g., MA, MS, MEng, MEd, MSW, MBA)  Occupational History   Occupation: retired  Tobacco Use   Smoking status: Former    Packs/day: 1.00    Years: 38.00    Total pack years: 38.00    Types: Cigarettes    Quit date: 12/1996    Years since quitting: 25.3   Smokeless tobacco: Never  Vaping Use   Vaping Use: Never used  Substance and Sexual Activity   Alcohol use: Yes    Comment: occassionally   Drug use: No   Sexual activity: Not Currently  Other Topics Concern   Not on file  Social History Narrative   Lives with his significant other, pt is retired    Scientist, 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[place]  (11/08/2021)   Overall Financial Resource Strain (CARDIA)    Difficulty of Paying Living Expenses: Not hard at all  Food Insecurity: No Food Insecurity (11/08/2021)   Hunger Vital Sign    Worried About Running Out of Food in the Last Year: Never true    [place] in the Last Year: Never true  Transportation Needs: No Transportation Needs (11/08/2021)   PRAPARE - Hydrologist (Medical): No    Lack of Transportation (Non-Medical): No  Physical Activity: Inactive (11/08/2021)   Exercise Vital Sign    Days of Exercise per Week: 0 days    Minutes of Exercise per Session: 0 min  Stress: No Stress Concern Present (11/08/2021)   [place]    Feeling of Stress : Not at all  Social Connections: Socially Isolated (11/08/2021)   Social Connection and Isolation Panel [NHANES]    Frequency of Communication with Friends and Family: More than three times a week    Frequency of Social Gatherings with Friends and Family: Once a week    Attends Religious Services: Never    Marine scientist or Organizations: No    Attends Archivist Meetings: Never    Marital Status: Divorced  Human resources officer Violence: Not At Risk (11/08/2021)   Humiliation, Afraid, Rape, and Kick  questionnaire    Fear of Current or Ex-Partner: No    Emotionally Abused: No    Physically Abused: No    Sexually Abused: No    Family History:   The patient's family history includes Heart attack in his mother; Hypertension in his father and mother; Osteoarthritis in his mother; Stroke in his father, sister, and sister. There is no history of Colon cancer, Prostate cancer, or Cancer - Lung.    ROS:  Please see the history of present illness.   All other ROS reviewed and negative.     Physical Exam/Data:   Vitals:   03/22/22 1620 03/22/22 1625 03/22/22 1630 03/22/22 1635  BP:   126/80   Pulse: 75 73 72 73  Resp: 17 15 10 19   Temp:      TempSrc:      SpO2: 94% 96% 95% 94%  Weight:      Height:       No intake or output data in the 24 hours ending 03/22/22 1644    03/22/2022    3:00 PM 03/19/2022    1:27 PM 03/12/2022   11:38 AM  Last 3 Weights  Weight (lbs) 198 lb 198 lb 12.8 oz 200 lb 9.6 oz  Weight (kg) 89.812 kg 90.175 kg 90.992 kg     Body mass index is 31.48 kg/m.  General:  Well nourished, well developed male appearing in no acute distress.    Currently denies CP  HEENT: normal Neck: no JVD Vascular: No carotid bruits; Distal pulses 2+ bilaterally   Cardiac:  normal S1, S2; RRR; no murmur  Lungs:  clear to auscultation bilaterally, no wheezing, rhonchi or rales  Abd: soft, nontender, no hepatomegaly  Ext: no pitting edema Musculoskeletal:  No deformities, BUE and BLE strength normal and equal Skin: warm and dry  Neuro:  CNs 2-12 intact, no focal abnormalities noted Psych:  Normal affect    EKG:  The ECG that was done  was personally reviewed and demonstrates normal sinus rhythm, heart rate 75 with first-degree AV block and no acute ST changes.  Relevant CV Studies:  NST: 12/2012 Post Stress Image LV EF: 53 %   Stress EDV: 87 ml EDVI: 42 ml/m TID:   Stress ESV: 41 ml ESVI: 20 ml/m   Wall Motion Wall Thickening   Anterior: Normal Normal   Apex: Normal  Normal   Inferior: Normal Normal   Lateral: Normal Normal   Septal: Dyskinetic Normal   Laboratory Data:  High Sensitivity Troponin:   Recent Labs  Lab 03/22/22 1517  TROPONINIHS 283*      Chemistry Recent Labs  Lab 03/22/22 1517  NA 136  K 4.2  CL 104  CO2 24  GLUCOSE 216*  BUN 25*  CREATININE 1.13  CALCIUM 9.5  GFRNONAA >60  ANIONGAP 8    Recent Labs  Lab 03/22/22 1517  PROT 7.3  ALBUMIN 3.9  AST 23  ALT 22  ALKPHOS 57  BILITOT 0.5   Lipids No results for input(s): "CHOL", "TRIG", "HDL", "LABVLDL", "LDLCALC", "CHOLHDL" in the last 168 hours. Hematology Recent Labs  Lab 03/22/22 1517  WBC 6.8  RBC 4.82  HGB 14.8  HCT 44.9  MCV 93.2  MCH 30.7  MCHC 33.0  RDW 13.8  PLT 232   Thyroid No results for input(s): "TSH", "FREET4" in the last 168 hours. BNPNo results for input(s): "BNP", "PROBNP" in the last 168 hours.  DDimer No results for input(s): "DDIMER" in the last 168 hours.   Radiology/Studies:  DG Chest 2 View  Result Date: 03/22/2022 CLINICAL DATA:  Chest pain in a 76 year old male. EXAM: CHEST - 2 VIEW COMPARISON:  October 02, 2020. FINDINGS: Post median sternotomy for CABG. EKG leads project over the chest. Cardiomediastinal contours and hilar structures are stable. Hazy opacity at the LEFT lung base. Increased since previous imaging. No sign of lobar consolidation. The no signs of pneumothorax or pleural effusion. On limited assessment there is no acute skeletal finding. IMPRESSION: Hazy opacity at the LEFT lung base, may represent atelectasis, or scarring with increase. Could also be exacerbated by prominence of pre pericardial fat. Developing infection in this area is however not entirely excluded. Suggest follow-up outside of the acute setting to assess for any changes. Electronically Signed   By: Zetta Bills M.D.   On: 03/22/2022 15:59     Assessment and Plan:   1. NSTEMI/CAD - He presents with worsening chest pain and dyspnea on  exertion for the past few weeks in the setting of known CAD with prior CABG and his last ischemic evaluation was a NST in 2014.  - Initial troponin at 283 with repeat values pending.  EKG without acute ST changes. Echocardiogram is pending to reassess LV function and wall motion. - Would anticipate cardiac catheterization for definitive evaluation on Monday unless pain is uncontrolled over the weekend and he requires an urgent procedure.  - Will start IV Heparin. Continue Zetia 72m daily (statin intolerant) and ASA 864mdaily. Previously not on beta-blocker therapy but would plan to add unless HR does not allow.   2. HTN - BP is well controlled at 126/80 on most recent check. Will continue PTA Losartan 2580maily and HCTZ 64m44mily. Would hold HCTZ the  morning of his catheterization.   3. HLD - FLP on 03/12/2022 showed total cholesterol 120, triglycerides 94, HDL 39 and LDL 63.  He has been intolerant to statins and is on Repatha as an outpatient along with Zetia 10 mg daily.  4. Type 2 DM - Hgb A1c was at 8.4 in 08/2021. Will recheck this admission. Will continue PTA [place]. Holding Metformin and Glipizide. Will order SSI.    Risk Assessment/Risk Scores:    TIMI Risk Score for Unstable Angina or Non-ST Elevation MI:   The patient's TIMI risk score is 6, which indicates a 41% risk of all cause mortality, new or recurrent myocardial infarction or need for urgent revascularization in the next 14 days.    Severity of Illness: The appropriate patient status for this patient is INPATIENT. Inpatient status is judged to be reasonable and necessary in order to provide the required intensity of service to ensure the patient's safety. The patient's presenting symptoms, physical exam findings, and initial radiographic and laboratory data in the context of their chronic comorbidities is felt to place them at high risk for further clinical deterioration. Furthermore, it is not anticipated  that the patient will be medically stable for discharge from the hospital within 2 midnights of admission.   * I certify that at the point of admission it is my clinical judgment that the patient will require inpatient hospital care spanning beyond 2 midnights from the point of admission due to high intensity of service, high risk for further deterioration and high frequency of surveillance required.*   For questions or updates, please contact [place] Please consult [email or web address] for contact info under     Signed, Erma Heritage, PA-C  03/22/2022 4:44 PM    Patient seen and examined  PE reflects my exam I have amended note to reflect my findings in History as well  Pt presents today with CP and  NSTEMI   Symptoms have been progressive over past few wks   Remote CABG (2008, [facility])  Recomm Admit to cardiology at [facility] IV heparin ordered  Continue to cycle troponins    Echo tomorrow  Would recomm L heart cath to define anatomy.    Risks / benefits described.  Pt understands and agrees to proceed.   Will tentatively plan for Monday    Dorris Carnes MD

## 2022-03-22 NOTE — ED Triage Notes (Signed)
Pt to ER states he has been having "spells" of chest tightness, SHOB, and left arm pain.  Pt states has become more frequent.  Pt was seen today at [facility] and was sent here for furhter evaluation.

## 2022-03-22 NOTE — ED Provider Notes (Signed)
[facility]    CSN: [id number] Arrival date & time: 03/22/22  1342      History   Chief Complaint No chief complaint on file.   HPI Ryan Herrera is a 76 y.o. male.   Patient presents with left-sided chest pain, shortness of breath, pain rating down his arm for the past few weeks.  At first, thought it was indigestion and was seen, GERD medicines were adjusted, and patient reports this has not helped.  He endorses shortness of breath with the pain.  Pain lasts a few minutes, then subsides.  Reports it is severe at times.  Denies any radiation to his back, however does radiate down his left arm.  Reports happens multiple times a day, is not related to exertion, however is random.  Denies any recent trauma, accident, injury to the chest.  Denies any recent sudden increase in anxiety or stressors.  Currently reports pain is slowly improving.  Denies any cough, congestion, nausea, diaphoresis, heartburn, palpitations.  Medical history significant for coronary artery disease, CABG about 20 years ago, diabetes, hyperlipidemia, hypertension, gout, GERD, chronic kidney disease.       Past Medical History:  Diagnosis Date   Arthritis    hands   Coronary artery disease    Diabetes mellitus without complication ([place])    GERD (gastroesophageal reflux disease)    H/O poliomyelitis    age 44.  now has "weak stomach muscles"   Hypertension    IBS (irritable bowel syndrome)    Iron deficiency anemia 09/22/2018   Renal insufficiency    Thyroid disease     Patient Active Problem List   Diagnosis Date Noted   NSTEMI (non-ST elevated myocardial infarction) ([place]) 03/22/2022   Mixed hyperlipidemia 03/19/2022   Injury of finger of left hand 03/12/2022   Gastroesophageal reflux disease 03/08/2022   CKD (chronic kidney disease), stage II 03/08/2022   Penile irritation 10/22/2021   Chronic diarrhea 10/22/2021   Gout 10/22/2021   Cold sore 10/22/2021   Vitamin B 12  deficiency 10/22/2021   Age-related nuclear cataract of both eyes 10/16/2018   Iron deficiency anemia 09/22/2018   History of poliomyelitis 12/10/2017   Penile lesion 10/15/2017   Hyperopia with astigmatism and presbyopia, bilateral 04/21/2017   DM type 2 causing vascular disease ([place]) 04/21/2017   History of chronic kidney disease 02/14/2015   Familial multiple lipoprotein-type hyperlipidemia 08/18/2014   Class 1 obesity due to excess calories with serious comorbidity and body mass index (BMI) of 31.0 to 31.9 in adult 07/16/2013   Right inguinal hernia 06/16/2013   Arthritis of knee 08/12/2012   Knee joint replaced by other means 08/12/2012   Cervical spondylosis without myelopathy 07/29/2012   Osteoarthritis, hand 02/24/2012   BPH (benign prostatic hyperplasia) 02/11/2012   ED (erectile dysfunction) 02/11/2012   CAD (coronary artery disease), native coronary artery 05/28/2011   Carpal tunnel syndrome, bilateral 05/28/2011   Chronic low back pain 05/28/2011   Diabetes mellitus type 2 without retinopathy ([place]) 05/28/2011   DJD (degenerative joint disease) 05/28/2011   Dyslipidemia 05/28/2011   Essential hypertension, benign 05/28/2011   Lung nodules 05/28/2011   Hypothyroidism 05/28/2011    Past Surgical History:  Procedure Laterality Date   BACK SURGERY  1967   benign tumor removal  2004   CORONARY ARTERY BYPASS GRAFT  2008   [facility] - 3 vessel   CYSTOSCOPY WITH INSERTION OF UROLIFT     HERNIA REPAIR Bilateral    inguinal   HYDROCELE  EXCISION / REPAIR     JOINT REPLACEMENT     left knee x2, rright x1   PROSTATE SURGERY N/A 01/2021   TONSILLECTOMY         Home Medications    Prior to Admission medications   Medication Sig Start Date End Date Taking? Authorizing Provider  allopurinol (ZYLOPRIM) 100 MG tablet Take 1 tablet (100 mg total) by mouth daily. 10/22/21   Renee Rival, FNP  aspirin EC 81 MG tablet Take 81 mg by mouth daily.    [provider]   B-12 MICROLOZENGE 500 MCG SUBL DISSOLVE 2 TABLETS UNDER THE TONGUE ONCE DAILY 07/14/18   [provider]  BD PEN NEEDLE NANO U/F 32G X 4 MM MISC  08/26/18   [provider]  doxycycline (VIBRAMYCIN) 100 MG capsule doxycycline hyclate 100 mg capsule  TAKE 1 CAPSULE BY MOUTH TWICE A DAY FOR 30 DAYS    [provider]  Evolocumab (REPATHA SURECLICK) 878 MG/ML SOAJ Inject 140 mg into the skin every 14 (fourteen) days. 03/08/22   Renee Rival, FNP  ezetimibe (ZETIA) 10 MG tablet Take 1 tablet (10 mg total) by mouth daily. 09/10/21   Janina Mayo, MD  glipiZIDE (GLUCOTROL XL) 5 MG 24 hr tablet TAKE 1 TABLET BY MOUTH EVERY DAY WITH BREAKFAST 03/22/22   Cassandria Anger, MD  glucose blood (FREESTYLE PRECISION NEO TEST) test strip Use to test glucose 2 times daily. 03/19/22   Cassandria Anger, MD  hydrochlorothiazide (HYDRODIURIL) 25 MG tablet Take 1 tablet (25 mg total) by mouth daily. 09/10/21   Janina Mayo, MD  insulin degludec (TRESIBA FLEXTOUCH) 100 UNIT/ML FlexTouch Pen Inject 20 Units into the skin at bedtime. 03/20/22   Cassandria Anger, MD  JARDIANCE 10 MG TABS tablet Take 1 tablet (10 mg total) by mouth every morning. 09/10/21   Janina Mayo, MD  ketoconazole (NIZORAL) 2 % shampoo  11/01/18   [provider]  levothyroxine (SYNTHROID) 100 MCG tablet TAKE 1 TABLET BY MOUTH DAILY BEFORE BREAKFAST. 03/20/22   Renee Rival, FNP  losartan (COZAAR) 25 MG tablet Take by mouth. 01/31/20 02/26/22  [provider]  MAG64 64 MG TBEC Take 1 tablet by mouth daily. 09/11/18   [provider]  metFORMIN (GLUCOPHAGE) 1000 MG tablet Take 1,000 mg by mouth 2 (two) times daily with a meal.    [provider]  ONETOUCH VERIO test strip USE AS DIRECTED 4 TIMES A DAY 09/22/18   [provider]  pantoprazole (PROTONIX) 40 MG tablet Take 1 tablet (40 mg total) by mouth daily. 03/08/22   [employer], Dewaine Conger, FNP  polyethylene  glycol (MIRALAX / GLYCOLAX) 17 g packet Take 17 g by mouth daily as needed.    [provider]  tadalafil (CIALIS) 5 MG tablet Take 5 mg by mouth daily.    [provider]  valACYclovir (VALTREX) 500 MG tablet Take 500 mg by mouth 2 (two) times daily.    [provider]  rosuvastatin (CRESTOR) 5 MG tablet Take 5 mg by mouth daily.  05/12/20  [provider]    Family History Family History  Problem Relation Age of Onset   Hypertension Mother    Heart attack Mother    Osteoarthritis Mother    Stroke Father    Hypertension Father    Stroke Sister    Stroke Sister    Colon cancer Neg Hx    Prostate cancer Neg Hx  Cancer - Lung Neg Hx     Social History Social History   Tobacco Use   Smoking status: Former    Packs/day: 1.00    Years: 38.00    Total pack years: 38.00    Types: Cigarettes    Quit date: 12/1996    Years since quitting: 25.3   Smokeless tobacco: Never  Vaping Use   Vaping Use: Never used  Substance Use Topics   Alcohol use: Yes    Comment: occassionally   Drug use: No     Allergies   Mirabegron, Niacin and related, Other, and Statins   Review of Systems Review of Systems Per HPI  Physical Exam Triage Vital Signs ED Triage Vitals  Enc Vitals Group     BP 03/22/22 1417 (!) 150/85     Pulse Rate 03/22/22 1417 76     Resp 03/22/22 1417 18     Temp 03/22/22 1417 97.8 F (36.6 C)     Temp Source 03/22/22 1417 Oral     SpO2 03/22/22 1417 96 %     Weight --      Height --      Head Circumference --      Peak Flow --      Pain Score 03/22/22 1418 4     Pain Loc --      Pain Edu? --      Excl. in [place]? --    No data found.  Updated Vital Signs BP (!) 150/85 (BP Location: Right Arm)   Pulse 76   Temp 97.8 F (36.6 C) (Oral)   Resp 18   SpO2 96%   Visual Acuity Right Eye Distance:   Left Eye Distance:   Bilateral Distance:    Right Eye Near:   Left Eye Near:    Bilateral Near:     Physical  Exam Vitals and nursing note reviewed.  Constitutional:      General: He is not in acute distress.    Appearance: Normal appearance. He is not ill-appearing, toxic-appearing or diaphoretic.  HENT:     Head: Normocephalic and atraumatic.  Cardiovascular:     Rate and Rhythm: Normal rate and regular rhythm.  Pulmonary:     Effort: Pulmonary effort is normal. No respiratory distress.     Breath sounds: No stridor. No wheezing, rhonchi or rales.  Chest:     Chest wall: No tenderness.  Skin:    General: Skin is warm and dry.     Capillary Refill: Capillary refill takes less than 2 seconds.     Coloration: Skin is not jaundiced or pale.     Findings: No erythema.  Neurological:     Mental Status: He is alert and oriented to person, place, and time.  Psychiatric:        Behavior: Behavior is cooperative.      UC Treatments / Results  Labs (all labs ordered are listed, but only abnormal results are displayed) Labs Reviewed - No data to display  EKG   Radiology  Procedures Procedures (including critical care time)  Medications Ordered in UC Medications - No data to display  Initial Impression / Assessment and Plan / UC Course  I have reviewed the triage vital signs and the nursing notes.  Pertinent labs & imaging results that were available during my care of the patient were reviewed by me and considered in my medical decision making (see chart for details).    EKG is reassuring when compared with  previous EKG in urgent care.  However, given symptoms, significant cardiac history, I am concerned for myocardial ischemia.  I recommended immediate evaluation in the emergency room for further evaluation and work-up of the chest pain.  Patient and wife are in agreement to this plan.  Patient stable to transport via private vehicle at this time. Final Clinical Impressions(s) / UC Diagnoses   Final diagnoses:  Chest pressure     Discharge Instructions      - Please go  directly to the Emergency Room for further evaluation of your chest pressure   ED Prescriptions   None    PDMP not reviewed this encounter.   Eulogio Bear, NP 03/22/22 1753

## 2022-03-22 NOTE — Discharge Instructions (Addendum)
-   Please go directly to the Emergency Room for further evaluation of your chest pressure

## 2022-03-22 NOTE — ED Triage Notes (Signed)
Chest pain, sob and left arm pain off and on x a couple of weeks.  Was seen here for same and had EKG weeks ago.

## 2022-03-23 ENCOUNTER — Inpatient Hospital Stay (HOSPITAL_COMMUNITY): Payer: Medicare HMO

## 2022-03-23 ENCOUNTER — Other Ambulatory Visit (HOSPITAL_COMMUNITY): Payer: Medicare HMO

## 2022-03-23 DIAGNOSIS — I214 Non-ST elevation (NSTEMI) myocardial infarction: Secondary | ICD-10-CM

## 2022-03-23 DIAGNOSIS — E7800 Pure hypercholesterolemia, unspecified: Secondary | ICD-10-CM | POA: Diagnosis not present

## 2022-03-23 DIAGNOSIS — I10 Essential (primary) hypertension: Secondary | ICD-10-CM | POA: Diagnosis not present

## 2022-03-23 DIAGNOSIS — E119 Type 2 diabetes mellitus without complications: Secondary | ICD-10-CM | POA: Diagnosis not present

## 2022-03-23 DIAGNOSIS — E039 Hypothyroidism, unspecified: Secondary | ICD-10-CM

## 2022-03-23 LAB — TROPONIN I (HIGH SENSITIVITY): Troponin I (High Sensitivity): 1667 ng/L

## 2022-03-23 LAB — ECHOCARDIOGRAM COMPLETE
AR max vel: 2.33 cm2
AV Area VTI: 2.45 cm2
AV Area mean vel: 2.21 cm2
AV Mean grad: 5 mmHg
AV Peak grad: 8.8 mmHg
Ao pk vel: 1.48 m/s
Area-P 1/2: 3.01 cm2
Height: 66.5 in
S' Lateral: 3 cm
Weight: 3168 oz

## 2022-03-23 LAB — BASIC METABOLIC PANEL WITH GFR
Anion gap: 8 (ref 5–15)
BUN: 25 mg/dL — ABNORMAL HIGH (ref 8–23)
CO2: 24 mmol/L (ref 22–32)
Calcium: 8.7 mg/dL — ABNORMAL LOW (ref 8.9–10.3)
Chloride: 105 mmol/L (ref 98–111)
Creatinine, Ser: 1.16 mg/dL (ref 0.61–1.24)
GFR, Estimated: >60 mL/min
Glucose, Bld: 78 mg/dL (ref 70–99)
Potassium: 3.6 mmol/L (ref 3.5–5.1)
Sodium: 137 mmol/L (ref 135–145)

## 2022-03-23 LAB — CBG MONITORING, ED
Glucose-Capillary: 90 mg/dL (ref 70–99)
Glucose-Capillary: 96 mg/dL (ref 70–99)

## 2022-03-23 LAB — GLUCOSE, CAPILLARY
Glucose-Capillary: 213 mg/dL — ABNORMAL HIGH (ref 70–99)
Glucose-Capillary: 161 mg/dL — ABNORMAL HIGH (ref 70–99)
Glucose-Capillary: 194 mg/dL — ABNORMAL HIGH (ref 70–99)

## 2022-03-23 LAB — HEMOGLOBIN A1C
Hgb A1c MFr Bld: 7.6 % — ABNORMAL HIGH (ref 4.8–5.6)
Mean Plasma Glucose: 171.42 mg/dL

## 2022-03-23 LAB — HEPARIN LEVEL (UNFRACTIONATED)
Heparin Unfractionated: 0.45 IU/mL (ref 0.30–0.70)
Heparin Unfractionated: 0.49 [IU]/mL (ref 0.30–0.70)

## 2022-03-23 MED ORDER — BACITRACIN ZINC 500 UNIT/GM EX OINT
TOPICAL_OINTMENT | CUTANEOUS | Status: AC
  Administered 2022-03-23: 1 via TOPICAL
  Filled 2022-03-23: qty 0.9

## 2022-03-23 MED ORDER — BACITRACIN ZINC 500 UNIT/GM EX OINT: TOPICAL_OINTMENT | Freq: Once | CUTANEOUS | Status: AC

## 2022-03-23 MED ORDER — METOPROLOL TARTRATE 12.5 MG HALF TABLET
12.5000 mg | ORAL_TABLET | Freq: Two times a day (BID) | ORAL | Status: DC
  Administered 2022-03-23: 12.5 mg via ORAL
  Filled 2022-03-23: qty 1

## 2022-03-23 MED ORDER — ALLOPURINOL 100 MG PO TABS
100.0000 mg | ORAL_TABLET | Freq: Every day | ORAL | Status: DC
  Administered 2022-03-23 – 2022-03-27 (×5): 100 mg via ORAL
  Filled 2022-03-23 (×5): qty 1

## 2022-03-23 NOTE — ED Notes (Signed)
Pt has a dressing on distal right 4th digit. Pt requests dressing be changed q12h per his Hand Doctors orders. This RN applied Bacitracin with non-adherent gauze to wound and used gauze wrap to secure dressing.

## 2022-03-23 NOTE — ED Notes (Addendum)
Date and time results received: 03/23/22 0604   Test: TROPONIN Critical Value: 1667  Name of Provider Notified: Harrington Challenger, MD, Vickki Muff, MD

## 2022-03-23 NOTE — Progress Notes (Signed)
ANTICOAGULATION CONSULT NOTE - Follow Up Consult  Pharmacy Consult for IV heparin Indication: NSTEMI  Allergies  Allergen Reactions   Mirabegron Other (See Comments)   Niacin And Related Itching   Other     PT does not want OPIOIDS     Statins Itching    Patient Measurements: Height: 5' 6.5" (168.9 cm) Weight: 89.8 kg (198 lb) IBW/kg (Calculated) : 64.95 Heparin Dosing Weight: 83.8 kg   Vital Signs: Temp: 98.1 F (36.7 C) (06/17 0742) Temp Source: Axillary (06/17 0742) BP: 119/74 (06/17 0730) Pulse Rate: 71 (06/17 0730)  Labs: Recent Labs    03/22/22 1517 03/22/22 1659 03/23/22 0110 03/23/22 0456 03/23/22 0757  HGB 14.8  --   --   --   --   HCT 44.9  --   --   --   --   PLT 232  --   --   --   --   APTT 28  --   --   --   --   LABPROT 12.7  --   --   --   --   INR 1.0  --   --   --   --   HEPARINUNFRC  --   --  0.45  --  0.49  CREATININE 1.13  --  1.16  --   --   TROPONINIHS 283* 421*  --  1,667*  --     Estimated Creatinine Clearance: 55.6 mL/min (by C-G formula based on SCr of 1.16 mg/dL).  Assessment: 20 YOM with a PMH of CAD (s/p CABG in 09/2007 at [facility], normal NST in 12/2012) who presented to [facility] ED for chest pain. Transferred to [facility] for management of NSTEMI. Not on anticoagulation PTA.   Heparin level therapeutic at 0.49 on 1150 units/hr. Confirmed heparin running appropriately upon transfer. CBC stable. Troponin trending up, peak thus far = 1667.   Goal of Therapy:  Heparin level 0.3-0.7 units/ml Monitor platelets by anticoagulation protocol: Yes   Plan:  Continue heparin gtt at 1150 units/hr Monitor heparin level, CBC, and s/sx of bleeding daily  Joseph Art, Pharm.D. PGY-1 Pharmacy Resident [phone] 03/23/2022 9:04 AM    Pauletta Browns 03/23/2022,8:55 AM

## 2022-03-23 NOTE — Progress Notes (Signed)
Pt admitted to [facility], VS wnL and as per flow. Pt oriented to 6E processes. NSR/HB1 on telemetry. Pt has a bandage on his 4th finger (R) hand from a recent accident (2 weeks ago) and multiple tick bites on his body. Pt says every time he leaves his house, he's full of ticks. All questions and concerns addressed. Call bell placed within reach, will continue to monitor and maintain safety.

## 2022-03-23 NOTE — Progress Notes (Signed)
  Echocardiogram 2D Echocardiogram has been performed.  Joette Catching 03/23/2022, 11:52 AM

## 2022-03-23 NOTE — Progress Notes (Signed)
Progress Note  Patient Name: Ryan Herrera Date of Encounter: 03/23/2022  [facility] Cardiologist: Janina Mayo, MD   Subjective   Anxious about his condition.  Currently chest pain free.   Inpatient Medications    Scheduled Meds:  allopurinol  100 mg Oral Daily   aspirin EC  81 mg Oral Daily   empagliflozin  10 mg Oral q morning   ezetimibe  10 mg Oral Daily   hydrochlorothiazide  25 mg Oral Daily   insulin aspart  0-15 Units Subcutaneous TID WC   insulin glargine-yfgn  20 Units Subcutaneous QHS   levothyroxine  100 mcg Oral Q0600   losartan  25 mg Oral Daily   pantoprazole  40 mg Oral Daily   sodium chloride flush  3 mL Intravenous Q12H   Continuous Infusions:  sodium chloride     [START ON 03/25/2022] sodium chloride     Followed by   [START ON 03/25/2022] sodium chloride     heparin 1,150 Units/hr (03/23/22 0928)   nitroGLYCERIN 20 mcg/min (03/23/22 0130)   PRN Meds: sodium chloride, acetaminophen, nitroGLYCERIN, nitroGLYCERIN, ondansetron (ZOFRAN) IV, sodium chloride flush   Vital Signs    Vitals:   03/23/22 0700 03/23/22 0730 03/23/22 0742 03/23/22 0903  BP: 114/72 119/74  119/75  Pulse: 72 71  73  Resp: '15 18  13  '$ Temp:   98.1 F (36.7 C)   TempSrc:   Axillary   SpO2: 97% 97%  98%  Weight:      Height:       No intake or output data in the 24 hours ending 03/23/22 0953    03/22/2022    3:00 PM 03/19/2022    1:27 PM 03/12/2022   11:38 AM  Last 3 Weights  Weight (lbs) 198 lb 198 lb 12.8 oz 200 lb 9.6 oz  Weight (kg) 89.812 kg 90.175 kg 90.992 kg      Telemetry    Sinus rhythm - Personally Reviewed  ECG    Sinus rhythm.  Rate 67 bpm.  First degree AV block.  LVH- Personally Reviewed  Physical Exam   VS:  BP 119/75 (BP Location: Left Arm)   Pulse 73   Temp 98.1 F (36.7 C) (Axillary)   Resp 13   Ht 5' 6.5" (1.689 m)   Wt 89.8 kg   SpO2 98%   BMI 31.48 kg/m  , BMI Body mass index is 31.48 kg/m. GENERAL:  Well  appearing HEENT: Pupils equal round and reactive, fundi not visualized, oral mucosa unremarkable NECK:  No jugular venous distention, waveform within normal limits, carotid upstroke brisk and symmetric, no bruits, no thyromegaly LUNGS:  Clear to auscultation bilaterally HEART:  RRR.  PMI not displaced or sustained,S1 and S2 within normal limits, no S3, no S4, no clicks, no rubs, no murmurs ABD:  Flat, positive bowel sounds normal in frequency in pitch, no bruits, no rebound, no guarding, no midline pulsatile mass, no hepatomegaly, no splenomegaly EXT:  2 plus pulses throughout, no edema, no cyanosis no clubbing SKIN:  No rashes no nodules NEURO:  Cranial nerves II through XII grossly intact, motor grossly intact throughout PSYCH:  Cognitively intact, oriented to person place and time   Labs    High Sensitivity Troponin:   Recent Labs  Lab 03/22/22 1517 03/22/22 1659 03/23/22 0456  TROPONINIHS 283* 421* 1,667*     Chemistry Recent Labs  Lab 03/22/22 1517 03/23/22 0110  NA 136 137  K 4.2 3.6  CL 104 105  CO2 24 24  GLUCOSE 216* 78  BUN 25* 25*  CREATININE 1.13 1.16  CALCIUM 9.5 8.7*  PROT 7.3  --   ALBUMIN 3.9  --   AST 23  --   ALT 22  --   ALKPHOS 57  --   BILITOT 0.5  --   GFRNONAA >60 >60  ANIONGAP 8 8    Lipids No results for input(s): "CHOL", "TRIG", "HDL", "LABVLDL", "LDLCALC", "CHOLHDL" in the last 168 hours.  Hematology Recent Labs  Lab 03/22/22 1517  WBC 6.8  RBC 4.82  HGB 14.8  HCT 44.9  MCV 93.2  MCH 30.7  MCHC 33.0  RDW 13.8  PLT 232   Thyroid No results for input(s): "TSH", "FREET4" in the last 168 hours.  BNPNo results for input(s): "BNP", "PROBNP" in the last 168 hours.  DDimer No results for input(s): "DDIMER" in the last 168 hours.   Radiology    DG Chest 2 View  Result Date: 03/22/2022 CLINICAL DATA:  Chest pain in a 76 year old male. EXAM: CHEST - 2 VIEW COMPARISON:  October 02, 2020. FINDINGS: Post median sternotomy for CABG. EKG  leads project over the chest. Cardiomediastinal contours and hilar structures are stable. Hazy opacity at the LEFT lung base. Increased since previous imaging. No sign of lobar consolidation. The no signs of pneumothorax or pleural effusion. On limited assessment there is no acute skeletal finding. IMPRESSION: Hazy opacity at the LEFT lung base, may represent atelectasis, or scarring with increase. Could also be exacerbated by prominence of pre pericardial fat. Developing infection in this area is however not entirely excluded. Suggest follow-up outside of the acute setting to assess for any changes. Electronically Signed   By: Zetta Bills M.D.   On: 03/22/2022 15:59    Cardiac Studies   Echo pending  Patient Profile     76 y.o. male with CAD status post CABG (09/2007 at [facility], normal NST in 12/2012) hypertension, hyperlipidemia, diabetes, and prior tobacco abuse admitted with NSTEMI.  Transferred from [place]    # NSTEMI:  # CAD s/p CABG: # Hyperlipidemia:  Presented to urgent care with chest pain and exertional dyspnea.  He was sent to the ED where hs troponin was 283-->421-->1667.  EKG was without acute changes.  He was evaluated by Dr. Harrington Challenger and transferred to [facility] for cath.  He has been on IV heparin.  He is currently chest pain-free.  We will add metoprolol tartrate 12.5 mg twice daily.  Watch blood pressure and heart rate.  Plan for left heart cath on Monday.  Echo pending.  He has been on aspirin, Repatha, and Zetia.  LDL was 63 on 03/2022.  # Hypertension:  Home antihypertensive regimen includes HCTZ and losartan.  Blood pressure and renal function are stable.  He is also been on Jardiance.  Adding metoprolol as above.  #Hypothyroidism: Continue levothyroxine.  TSH was 5.76 on 03/12/2022.  We will repeat TSH, T3, and free T4.   For questions or updates, please contact [place] Please consult [email or web address] for contact info under         Signed, Skeet Latch, MD  03/23/2022, 9:53 AM

## 2022-03-23 NOTE — ED Notes (Signed)
Pt calling Butch Penny to tell her Karen Chafe is coming.

## 2022-03-23 NOTE — Progress Notes (Signed)
[place] for Heparin Indication: chest pain/ACS Brief A/P: Heparin level within goal range Continue Heparin at current rate   Allergies  Allergen Reactions   Mirabegron Other (See Comments)   Niacin And Related Itching   Other     PT does not want OPIOIDS     Statins Itching   Patient Measurements: Height: 5' 6.5" (168.9 cm) Weight: 89.8 kg (198 lb) IBW/kg (Calculated) : 64.95 HEPARIN DW (KG): 83.8  Vital Signs: Temp: 97.6 F (36.4 C) (06/16 1500) Temp Source: Oral (06/16 1500) BP: 114/68 (06/17 0130) Pulse Rate: 72 (06/17 0130)  Labs: Recent Labs    03/22/22 1517 03/22/22 1659 03/23/22 0110  HGB 14.8  --   --   HCT 44.9  --   --   PLT 232  --   --   APTT 28  --   --   LABPROT 12.7  --   --   INR 1.0  --   --   HEPARINUNFRC  --   --  0.45  CREATININE 1.13  --  1.16  TROPONINIHS 283* 421*  --      Estimated Creatinine Clearance: 55.6 mL/min (by C-G formula based on SCr of 1.16 mg/dL).  Assessment: 76 y.o. male with chest pain for heparin   Goal of Therapy:  Heparin level 0.3-0.7 units/ml Monitor platelets by anticoagulation protocol: Yes   Plan:  Continue Heparin at current rate   Caryl Pina 03/23/2022,2:04 AM

## 2022-03-24 DIAGNOSIS — I214 Non-ST elevation (NSTEMI) myocardial infarction: Secondary | ICD-10-CM | POA: Diagnosis not present

## 2022-03-24 DIAGNOSIS — I10 Essential (primary) hypertension: Secondary | ICD-10-CM | POA: Diagnosis not present

## 2022-03-24 DIAGNOSIS — E7800 Pure hypercholesterolemia, unspecified: Secondary | ICD-10-CM | POA: Diagnosis not present

## 2022-03-24 LAB — BASIC METABOLIC PANEL
Anion gap: 9 (ref 5–15)
BUN: 23 mg/dL (ref 8–23)
CO2: 24 mmol/L (ref 22–32)
Calcium: 9 mg/dL (ref 8.9–10.3)
Chloride: 102 mmol/L (ref 98–111)
Creatinine, Ser: 1.2 mg/dL (ref 0.61–1.24)
GFR, Estimated: >60 mL/min (ref >60)
Glucose, Bld: 131 mg/dL — ABNORMAL HIGH (ref 70–99)
Potassium: 3.6 mmol/L (ref 3.5–5.1)
Sodium: 135 mmol/L (ref 135–145)

## 2022-03-24 LAB — CBC
HCT: 39 % (ref 39.0–52.0)
Hemoglobin: 12.9 g/dL — ABNORMAL LOW (ref 13.0–17.0)
MCH: 30.5 pg (ref 26.0–34.0)
MCHC: 33.1 g/dL (ref 30.0–36.0)
MCV: 92.2 fL (ref 80.0–100.0)
Platelets: 213 10*3/uL (ref 150–400)
RBC: 4.23 MIL/uL (ref 4.22–5.81)
RDW: 13.7 % (ref 11.5–15.5)
WBC: 7.9 10*3/uL (ref 4.0–10.5)
nRBC: 0 % (ref 0.0–0.2)

## 2022-03-24 LAB — GLUCOSE, CAPILLARY
Glucose-Capillary: 138 mg/dL — ABNORMAL HIGH (ref 70–99)
Glucose-Capillary: 151 mg/dL — ABNORMAL HIGH (ref 70–99)
Glucose-Capillary: 185 mg/dL — ABNORMAL HIGH (ref 70–99)
Glucose-Capillary: 171 mg/dL — ABNORMAL HIGH (ref 70–99)

## 2022-03-24 LAB — TSH: TSH: 2.349 u[IU]/mL (ref 0.350–4.500)

## 2022-03-24 LAB — HEPARIN LEVEL (UNFRACTIONATED): Heparin Unfractionated: 0.36 IU/mL (ref 0.30–0.70)

## 2022-03-24 LAB — LIPOPROTEIN A (LPA): Lipoprotein (a): 119.4 nmol/L — ABNORMAL HIGH (ref <75.0)

## 2022-03-24 LAB — T4, FREE: Free T4: 1.14 ng/dL — ABNORMAL HIGH (ref 0.61–1.12)

## 2022-03-24 MED ORDER — INSULIN GLARGINE-YFGN 100 UNIT/ML ~~LOC~~ SOLN: 20.0000 [IU] | Freq: Every day | SUBCUTANEOUS | Status: DC

## 2022-03-24 MED ORDER — INSULIN GLARGINE-YFGN 100 UNIT/ML ~~LOC~~ SOLN
20.0000 [IU] | Freq: Every day | SUBCUTANEOUS | Status: DC
  Administered 2022-03-25 – 2022-03-27 (×3): 20 [IU] via SUBCUTANEOUS
  Filled 2022-03-24 (×4): qty 0.2

## 2022-03-24 MED ORDER — INSULIN GLARGINE-YFGN 100 UNIT/ML ~~LOC~~ SOLN: 10.0000 [IU] | Freq: Every day | SUBCUTANEOUS | Status: DC

## 2022-03-24 MED ORDER — HYDROCHLOROTHIAZIDE 25 MG PO TABS
25.0000 mg | ORAL_TABLET | Freq: Every day | ORAL | Status: AC
  Administered 2022-03-24: 25 mg via ORAL
  Filled 2022-03-24: qty 1

## 2022-03-24 MED ORDER — INSULIN GLARGINE-YFGN 100 UNIT/ML ~~LOC~~ SOLN
10.0000 [IU] | Freq: Every day | SUBCUTANEOUS | Status: AC
Start: 2022-03-24 — End: 2022-03-24
  Administered 2022-03-24: 10 [IU] via SUBCUTANEOUS
  Filled 2022-03-24: qty 0.1

## 2022-03-24 MED ORDER — HYDROCHLOROTHIAZIDE 25 MG PO TABS
25.0000 mg | ORAL_TABLET | Freq: Every day | ORAL | Status: DC
Start: 2022-03-26 — End: 2022-03-25

## 2022-03-24 NOTE — Plan of Care (Signed)
  Problem: Activity: Goal: Ability to tolerate increased activity will improve Outcome: Progressing   Problem: Cardiac: Goal: Ability to achieve and maintain adequate cardiovascular perfusion will improve Outcome: Progressing   Problem: Elimination: Goal: Will not experience complications related to urinary retention Outcome: Progressing   Problem: Pain Managment: Goal: General experience of comfort will improve Outcome: Progressing

## 2022-03-24 NOTE — Progress Notes (Addendum)
Progress Note  Patient Name: Ryan Herrera Date of Encounter: 03/24/2022  Primary Cardiologist: Janina Mayo, MD  Subjective   Feeling well, no chest pain or SOB. Difficult night with not being able to sleep due to interruptions. Reports 3 weeks ago he accidentally cut off the tip of his finger on right hand and has been followed by hand specialist with recommendation for conservative dressing and outpatient f/u, no surgery felt to be needed at time of initial eval. Brief episodes of Mobitz 1 on tele this AM around after 7am - pt unsure if he was aware or asleep but was not symptomatic.  Inpatient Medications    Scheduled Meds:  allopurinol  100 mg Oral QHS   aspirin EC  81 mg Oral Daily   empagliflozin  10 mg Oral q morning   ezetimibe  10 mg Oral Daily   hydrochlorothiazide  25 mg Oral Daily   insulin aspart  0-15 Units Subcutaneous TID WC   insulin glargine-yfgn  20 Units Subcutaneous QHS   levothyroxine  100 mcg Oral Q0600   losartan  25 mg Oral Daily   metoprolol tartrate  12.5 mg Oral BID   pantoprazole  40 mg Oral Daily   sodium chloride flush  3 mL Intravenous Q12H   Continuous Infusions:  sodium chloride     [START ON 03/25/2022] sodium chloride     Followed by   [START ON 03/25/2022] sodium chloride     heparin 1,150 Units/hr (03/24/22 0734)   nitroGLYCERIN 30 mcg/min (03/24/22 0733)   PRN Meds: sodium chloride, acetaminophen, nitroGLYCERIN, nitroGLYCERIN, ondansetron (ZOFRAN) IV, sodium chloride flush   Vital Signs    Vitals:   03/23/22 2356 03/24/22 0247 03/24/22 0258 03/24/22 0545  BP: 107/67 128/71  119/68  Pulse: 77 75  69  Resp: 18   18  Temp: 98.3 F (36.8 C)   98.3 F (36.8 C)  TempSrc: Oral   Oral  SpO2: 93% (!) 88% 96% 96%  Weight:      Height:        Intake/Output Summary (Last 24 hours) at 03/24/2022 0810 Last data filed at 03/24/2022 0600 Gross per 24 hour  Intake 717.32 ml  Output 2745 ml  Net -2027.68 ml       03/22/2022    3:00 PM 03/19/2022    1:27 PM 03/12/2022   11:38 AM  Last 3 Weights  Weight (lbs) 198 lb 198 lb 12.8 oz 200 lb 9.6 oz  Weight (kg) 89.812 kg 90.175 kg 90.992 kg     Telemetry    NSR with brief episodes of Mobitz 1 this AM - Personally Reviewed  ECG    NSR without acute ST changes but suspected arm lead reversal - Personally Reviewed  Physical Exam   GEN: No acute distress.  HEENT: Normocephalic, atraumatic, sclera non-icteric. Neck: No JVD or bruits. Cardiac: RRR no murmurs, rubs, or gallops.  Respiratory: Clear to auscultation bilaterally. Breathing is unlabored. GI: Soft, nontender, non-distended, BS +x 4. MS: no deformity. Extremities: No clubbing or cyanosis. No edema. Distal pedal pulses are 2+ and equal bilaterally. Clean dressing entire right 4th finger Neuro:  AAOx3. Follows commands. Psych:  Responds to questions appropriately with a normal affect.  Labs    High Sensitivity Troponin:   Recent Labs  Lab 03/22/22 1517 03/22/22 1659 03/23/22 0456  TROPONINIHS 283* 421* 1,667*      Cardiac EnzymesNo results for input(s): "TROPONINI" in the last 168 hours. No results for input(s): "  Citrus" in the last 168 hours.   Chemistry Recent Labs  Lab 03/22/22 1517 03/23/22 0110  NA 136 137  K 4.2 3.6  CL 104 105  CO2 24 24  GLUCOSE 216* 78  BUN 25* 25*  CREATININE 1.13 1.16  CALCIUM 9.5 8.7*  PROT 7.3  --   ALBUMIN 3.9  --   AST 23  --   ALT 22  --   ALKPHOS 57  --   BILITOT 0.5  --   GFRNONAA >60 >60  ANIONGAP 8 8     Hematology Recent Labs  Lab 03/22/22 1517  WBC 6.8  RBC 4.82  HGB 14.8  HCT 44.9  MCV 93.2  MCH 30.7  MCHC 33.0  RDW 13.8  PLT 232    BNPNo results for input(s): "BNP", "PROBNP" in the last 168 hours.   DDimer No results for input(s): "DDIMER" in the last 168 hours.   Radiology    ECHOCARDIOGRAM COMPLETE  Result Date: 03/23/2022    ECHOCARDIOGRAM REPORT   Patient Name:   MAXIM BEDEL Date of  Exam: 03/23/2022 Medical Rec #:  [id number]                 Height:       66.5 in Accession #:    [id number]                Weight:       198.0 lb Date of Birth:  August 18, 1946                 BSA:          2.002 m Patient Age:    76 years                  BP:           124/71 mmHg Patient Gender: M                         HR:           67 bpm. Exam Location:  Inpatient Procedure: 2D Echo, Cardiac Doppler and Color Doppler Indications:    NSTEMI  History:        Patient has no prior history of Echocardiogram examinations.  Sonographer:    Joette Catching RCS Referring Phys: [id number] [place]  1. Left ventricular ejection fraction, by estimation, is 60 to 65%. The left ventricle has normal function. The left ventricle has no regional wall motion abnormalities. There is mild concentric left ventricular hypertrophy. Left ventricular diastolic parameters are consistent with Grade I diastolic dysfunction (impaired relaxation).  2. Right ventricular systolic function is normal. The right ventricular size is normal. There is normal pulmonary artery systolic pressure. The estimated right ventricular systolic pressure is 59.1 mmHg.  3. The mitral valve is grossly normal. No evidence of mitral valve regurgitation. No evidence of mitral stenosis. The mean mitral valve gradient is 2.0 mmHg. Moderate mitral annular calcification.  4. The aortic valve is tricuspid. There is moderate calcification of the aortic valve. There is moderate thickening of the aortic valve. Aortic valve regurgitation is not visualized. Aortic valve sclerosis/calcification is present, without any evidence of aortic stenosis. Aortic valve mean gradient measures 5.0 mmHg. Normal LV stroke volume.  5. Aortic dilatation noted. There is mild dilatation of the aortic root, measuring 40 mm. There is mild dilatation of the ascending aorta, measuring 41 mm. Repeat imaging recommended in one year  if clinically indicated.  6. The inferior vena cava  is normal in size with greater than 50% respiratory variability, suggesting right atrial pressure of 3 mmHg. Comparison(s): No prior Echocardiogram. FINDINGS  Left Ventricle: Calcified papillary muscle. Left ventricular ejection fraction, by estimation, is 60 to 65%. The left ventricle has normal function. The left ventricle has no regional wall motion abnormalities. The left ventricular internal cavity size was normal in size. There is mild concentric left ventricular hypertrophy. Left ventricular diastolic parameters are consistent with Grade I diastolic dysfunction (impaired relaxation). Right Ventricle: The right ventricular size is normal. No increase in right ventricular wall thickness. Right ventricular systolic function is normal. There is normal pulmonary artery systolic pressure. The tricuspid regurgitant velocity is 2.24 m/s, and  with an assumed right atrial pressure of 3 mmHg, the estimated right ventricular systolic pressure is 08.1 mmHg. Left Atrium: Left atrial size was normal in size. Right Atrium: Right atrial size was normal in size. Pericardium: There is no evidence of pericardial effusion. Presence of epicardial fat layer. Mitral Valve: MVA by 2d Planimetry. The mitral valve is grossly normal. Moderate mitral annular calcification. No evidence of mitral valve regurgitation. No evidence of mitral valve stenosis. The mean mitral valve gradient is 2.0 mmHg with average heart rate of 64 bpm. Tricuspid Valve: The tricuspid valve is normal in structure. Tricuspid valve regurgitation is trivial. No evidence of tricuspid stenosis. Aortic Valve: The aortic valve is tricuspid. There is moderate calcification of the aortic valve. There is moderate thickening of the aortic valve. There is moderate aortic valve annular calcification. Aortic valve regurgitation is not visualized. Aortic  valve sclerosis/calcification is present, without any evidence of aortic stenosis. Aortic valve mean gradient measures 5.0  mmHg. Aortic valve peak gradient measures 8.8 mmHg. Aortic valve area, by VTI measures 2.45 cm. Pulmonic Valve: The pulmonic valve was normal in structure. Pulmonic valve regurgitation is mild. No evidence of pulmonic stenosis. Aorta: Aortic dilatation noted. There is mild dilatation of the aortic root, measuring 40 mm. There is mild dilatation of the ascending aorta, measuring 41 mm. Venous: The inferior vena cava is normal in size with greater than 50% respiratory variability, suggesting right atrial pressure of 3 mmHg. IAS/Shunts: No atrial level shunt detected by color flow Doppler.  LEFT VENTRICLE PLAX 2D LVIDd:         4.70 cm   Diastology LVIDs:         3.00 cm   LV e' medial:    7.29 cm/s LV PW:         1.10 cm   LV E/e' medial:  13.1 LV IVS:        1.20 cm   LV e' lateral:   10.10 cm/s LVOT diam:     2.00 cm   LV E/e' lateral: 9.5 LV SV:         73 LV SV Index:   36 LVOT Area:     3.14 cm  RIGHT VENTRICLE            IVC RV Basal diam:  3.60 cm    IVC diam: 1.60 cm RV Mid diam:    2.30 cm RV S prime:     9.90 cm/s TAPSE (M-mode): 1.6 cm LEFT ATRIUM             Index        RIGHT ATRIUM           Index LA diam:  3.70 cm 1.85 cm/m   RA Area:     11.40 cm LA Vol (A2C):   35.5 ml 17.73 ml/m  RA Volume:   22.40 ml  11.19 ml/m LA Vol (A4C):   48.6 ml 24.27 ml/m LA Biplane Vol: 42.1 ml 21.02 ml/m  AORTIC VALVE                     PULMONIC VALVE AV Area (Vmax):    2.33 cm      PV Vmax:          0.96 m/s AV Area (Vmean):   2.21 cm      PV Peak grad:     3.7 mmHg AV Area (VTI):     2.45 cm      PR End Diast Vel: 4.93 msec AV Vmax:           148.00 cm/s AV Vmean:          112.000 cm/s AV VTI:            0.297 m AV Peak Grad:      8.8 mmHg AV Mean Grad:      5.0 mmHg LVOT Vmax:         110.00 cm/s LVOT Vmean:        78.800 cm/s LVOT VTI:          0.232 m LVOT/AV VTI ratio: 0.78  AORTA Ao Root diam: 4.00 cm Ao Asc diam:  4.00 cm MITRAL VALVE                TRICUSPID VALVE MV Area (PHT): 3.01 cm     TR  Peak grad:   20.1 mmHg MV Mean grad:  2.0 mmHg     TR Vmax:        224.00 cm/s MV Decel Time: 252 msec MV E velocity: 95.50 cm/s   SHUNTS MV A velocity: 105.00 cm/s  Systemic VTI:  0.23 m MV E/A ratio:  0.91         Systemic Diam: 2.00 cm Rudean Haskell MD Electronically signed by Rudean Haskell MD Signature Date/Time: 03/23/2022/1:17:02 PM    Final    DG Chest 2 View  Result Date: 03/22/2022 CLINICAL DATA:  Chest pain in a 76 year old male. EXAM: CHEST - 2 VIEW COMPARISON:  October 02, 2020. FINDINGS: Post median sternotomy for CABG. EKG leads project over the chest. Cardiomediastinal contours and hilar structures are stable. Hazy opacity at the LEFT lung base. Increased since previous imaging. No sign of lobar consolidation. The no signs of pneumothorax or pleural effusion. On limited assessment there is no acute skeletal finding. IMPRESSION: Hazy opacity at the LEFT lung base, may represent atelectasis, or scarring with increase. Could also be exacerbated by prominence of pre pericardial fat. Developing infection in this area is however not entirely excluded. Suggest follow-up outside of the acute setting to assess for any changes. Electronically Signed   By: Zetta Bills M.D.   On: 03/22/2022 15:59    Cardiac Studies   2d echo 03/23/22   1. Left ventricular ejection fraction, by estimation, is 60 to 65%. The  left ventricle has normal function. The left ventricle has no regional  wall motion abnormalities. There is mild concentric left ventricular  hypertrophy. Left ventricular diastolic  parameters are consistent with Grade I diastolic dysfunction (impaired  relaxation).   2. Right ventricular systolic function is normal. The right ventricular  size is normal. There is normal pulmonary artery systolic pressure.  The  estimated right ventricular systolic pressure is 21.3 mmHg.   3. The mitral valve is grossly normal. No evidence of mitral valve  regurgitation. No evidence of  mitral stenosis. The mean mitral valve  gradient is 2.0 mmHg. Moderate mitral annular calcification.   4. The aortic valve is tricuspid. There is moderate calcification of the  aortic valve. There is moderate thickening of the aortic valve. Aortic  valve regurgitation is not visualized. Aortic valve  sclerosis/calcification is present, without any evidence  of aortic stenosis. Aortic valve mean gradient measures 5.0 mmHg. Normal  LV stroke volume.   5. Aortic dilatation noted. There is mild dilatation of the aortic root,  measuring 40 mm. There is mild dilatation of the ascending aorta,  measuring 41 mm. Repeat imaging recommended in one year if clinically  indicated.   6. The inferior vena cava is normal in size with greater than 50%  respiratory variability, suggesting right atrial pressure of 3 mmHg.   Comparison(s): No prior Echocardiogram.   Patient Profile     76 y.o. male with CAD s/p CABG (09/2007 at [facility], normal NST in 12/2012) hypertension, hyperlipidemia, diabetes, and prior tobacco abuse admitted with NSTEMI.  Transferred from [facility] to [facility] for cath.  Assessment & Plan    1. CAD s/p prior CABG, here with NSTEMI - continue ASA, heparin, Zetia (on Repatha as OP), IV NTG - hold BB for now with Mobitz 1 this AM, follow - asymptomatic -pt unsure if he was awake or asleep - on the board for cath tomorrow, orders written by [facility] team (I updated add on board and consent to reflect graft angiography as well) - repeat EKG this AM given arm lead reversal  Shared Decision Making/Informed Consent The risks [stroke (1 in 1000), death (1 in 1000), kidney failure [usually temporary] (1 in 500), bleeding (1 in 200), allergic reaction [possibly serious] (1 in 200)], benefits (diagnostic support and management of coronary artery disease) and alternatives of a cardiac catheterization were discussed in detail with Mr. Kaczmarek and he is willing to proceed.   2. Essential HTN -  controlled on HCTZ '25mg'$  daily, losartan '25mg'$ , metoprolol 12.'5mg'$  BID - stop metoprolol as above - adjusted med orders to hold HCTZ tomorrow in prep for cath  3. Hyperlipidemia - last LDL 63 on 03/12/22, continue Zetia, Repatha as OP (statin intolerant)  4. Hypothyroidism - last TSH 03/12/22 was 5.760, started on levothyroxine 132mg daily by primary care with plan for follow-up visit in 6 weeks - repeat TFTs pending but may be too early to assess response   5. Diabetes mellitus - will half dose basal insulin tonight in prep for cath tomorrow, resume normal dosing tomorrow PM - CBG checks/SSI  6. Finger injury - patient reports he was instructed to keep dressing on until skin begins to heal back over - continue OP f/u with hand surgery  For questions or updates, please contact CLee AcresPlease consult [email or web address] for contact info under Cardiology/STEMI.  Signed, DCharlie Pitter PA-C 03/24/2022, 8:10 AM

## 2022-03-24 NOTE — Progress Notes (Addendum)
Repeat EKG was reviewed (ordered since earlier tracing with lead reversal). This shows NSR with first degree AVB, nonspecific TW changes. No acute ST changes seen. Pt was CP free today. Continue plan as outlined.

## 2022-03-24 NOTE — Progress Notes (Signed)
ANTICOAGULATION CONSULT NOTE - Follow Up Consult  Pharmacy Consult for IV heparin Indication: NSTEMI  Allergies  Allergen Reactions   Myrbetriq [Mirabegron] Other (See Comments)    Unknown reaction   Niacin And Related Itching   Other Diarrhea and Nausea And Vomiting    PT does not want OPIOIDS   GLP-1 drugs - Nausea, vomiting, diarrhea, belching   Statins Itching    Patient Measurements: Height: 5' 6.5" (168.9 cm) Weight: 89.8 kg (198 lb) IBW/kg (Calculated) : 64.95 Heparin Dosing Weight: 83.8 kg   Vital Signs: Temp: 98.3 F (36.8 C) (06/18 0545) Temp Source: Oral (06/18 0545) BP: 119/68 (06/18 0545) Pulse Rate: 69 (06/18 0545)  Labs: Recent Labs    03/22/22 1517 03/22/22 1659 03/23/22 0110 03/23/22 0456 03/23/22 0757 03/24/22 0743  HGB 14.8  --   --   --   --  12.9*  HCT 44.9  --   --   --   --  39.0  PLT 232  --   --   --   --  213  APTT 28  --   --   --   --   --   LABPROT 12.7  --   --   --   --   --   INR 1.0  --   --   --   --   --   HEPARINUNFRC  --   --  0.45  --  0.49 0.36  CREATININE 1.13  --  1.16  --   --   --   TROPONINIHS 283* 421*  --  1,667*  --   --      Estimated Creatinine Clearance: 55.6 mL/min (by C-G formula based on SCr of 1.16 mg/dL).  Assessment: 61 YOM with a PMH of CAD (s/p CABG in 09/2007 at [facility], normal NST in 12/2012) who presented to [facility] ED for chest pain. Transferred to [facility] for management of NSTEMI. Not on anticoagulation PTA.   Heparin at the lower end of therapeutic at 0.36 on 1150 units/hr. Hgb trending down but stable (14.8>12.9). PLT WNL. LHC scheduled for tomorrow.   Goal of Therapy:  Heparin level 0.3-0.7 units/ml Monitor platelets by anticoagulation protocol: Yes   Plan:  Continue heparin gtt at 1150 units/hr Monitor heparin level, CBC, and s/sx of bleeding daily  Joseph Art, Pharm.D. PGY-1 Pharmacy Resident [id number] 03/24/2022 8:35 AM

## 2022-03-25 ENCOUNTER — Encounter (HOSPITAL_COMMUNITY): Payer: Self-pay | Admitting: Interventional Cardiology

## 2022-03-25 ENCOUNTER — Encounter (HOSPITAL_COMMUNITY)
Admission: EM | Disposition: A | Discharge status: Home / Self Care | Payer: Self-pay | Source: Home / Self Care | Attending: Internal Medicine

## 2022-03-25 DIAGNOSIS — I214 Non-ST elevation (NSTEMI) myocardial infarction: Secondary | ICD-10-CM

## 2022-03-25 DIAGNOSIS — E119 Type 2 diabetes mellitus without complications: Secondary | ICD-10-CM

## 2022-03-25 DIAGNOSIS — I25110 Atherosclerotic heart disease of native coronary artery with unstable angina pectoris: Secondary | ICD-10-CM

## 2022-03-25 DIAGNOSIS — I2510 Atherosclerotic heart disease of native coronary artery without angina pectoris: Secondary | ICD-10-CM | POA: Diagnosis not present

## 2022-03-25 HISTORY — PX: LEFT HEART CATH AND CORS/GRAFTS ANGIOGRAPHY: CATH118250

## 2022-03-25 LAB — BASIC METABOLIC PANEL
Anion gap: 10 (ref 5–15)
BUN: 25 mg/dL — ABNORMAL HIGH (ref 8–23)
CO2: 22 mmol/L (ref 22–32)
Calcium: 8.9 mg/dL (ref 8.9–10.3)
Chloride: 100 mmol/L (ref 98–111)
Creatinine, Ser: 1.31 mg/dL — ABNORMAL HIGH (ref 0.61–1.24)
GFR, Estimated: 56 mL/min — ABNORMAL LOW (ref >60)
Glucose, Bld: 175 mg/dL — ABNORMAL HIGH (ref 70–99)
Potassium: 3.8 mmol/L (ref 3.5–5.1)
Sodium: 132 mmol/L — ABNORMAL LOW (ref 135–145)

## 2022-03-25 LAB — GLUCOSE, CAPILLARY
Glucose-Capillary: 161 mg/dL — ABNORMAL HIGH (ref 70–99)
Glucose-Capillary: 109 mg/dL — ABNORMAL HIGH (ref 70–99)
Glucose-Capillary: 147 mg/dL — ABNORMAL HIGH (ref 70–99)
Glucose-Capillary: 209 mg/dL — ABNORMAL HIGH (ref 70–99)

## 2022-03-25 LAB — HEPARIN LEVEL (UNFRACTIONATED): Heparin Unfractionated: 0.12 IU/mL — ABNORMAL LOW (ref 0.30–0.70)

## 2022-03-25 LAB — CBC
HCT: 37.3 % — ABNORMAL LOW (ref 39.0–52.0)
Hemoglobin: 12.4 g/dL — ABNORMAL LOW (ref 13.0–17.0)
MCH: 30.9 pg (ref 26.0–34.0)
MCHC: 33.2 g/dL (ref 30.0–36.0)
MCV: 93 fL (ref 80.0–100.0)
Platelets: 211 10*3/uL (ref 150–400)
RBC: 4.01 MIL/uL — ABNORMAL LOW (ref 4.22–5.81)
RDW: 13.5 % (ref 11.5–15.5)
WBC: 8.3 10*3/uL (ref 4.0–10.5)
nRBC: 0 % (ref 0.0–0.2)

## 2022-03-25 SURGERY — LEFT HEART CATH AND CORS/GRAFTS ANGIOGRAPHY
Anesthesia: LOCAL

## 2022-03-25 MED ORDER — FENTANYL CITRATE (PF) 100 MCG/2ML IJ SOLN
INTRAMUSCULAR | Status: AC
  Filled 2022-03-25: qty 2

## 2022-03-25 MED ORDER — HEPARIN (PORCINE) IN NACL 1000-0.9 UT/500ML-% IV SOLN
INTRAVENOUS | Status: DC | PRN
  Administered 2022-03-25 (×2): 500 mL

## 2022-03-25 MED ORDER — IOHEXOL 350 MG/ML SOLN
INTRAVENOUS | Status: DC | PRN
  Administered 2022-03-25: 135 mL

## 2022-03-25 MED ORDER — ONDANSETRON HCL 4 MG/2ML IJ SOLN: 4.0000 mg | Freq: Four times a day (QID) | INTRAMUSCULAR | Status: DC | PRN

## 2022-03-25 MED ORDER — SODIUM CHLORIDE 0.9% FLUSH: 3.0000 mL | INTRAVENOUS | Status: DC | PRN

## 2022-03-25 MED ORDER — BACITRACIN ZINC 500 UNIT/GM EX OINT
TOPICAL_OINTMENT | Freq: Two times a day (BID) | CUTANEOUS | Status: DC
  Filled 2022-03-25 (×2): qty 28.4

## 2022-03-25 MED ORDER — MIDAZOLAM HCL 2 MG/2ML IJ SOLN
INTRAMUSCULAR | Status: DC | PRN
  Administered 2022-03-25: 1 mg via INTRAVENOUS
  Administered 2022-03-25: 2 mg via INTRAVENOUS

## 2022-03-25 MED ORDER — VERAPAMIL HCL 2.5 MG/ML IV SOLN
INTRAVENOUS | Status: AC
  Filled 2022-03-25: qty 2

## 2022-03-25 MED ORDER — MIDAZOLAM HCL 2 MG/2ML IJ SOLN
INTRAMUSCULAR | Status: AC
  Filled 2022-03-25: qty 2

## 2022-03-25 MED ORDER — FENTANYL CITRATE (PF) 100 MCG/2ML IJ SOLN
INTRAMUSCULAR | Status: DC | PRN
  Administered 2022-03-25 (×2): 25 ug via INTRAVENOUS

## 2022-03-25 MED ORDER — ACETAMINOPHEN 325 MG PO TABS
650.0000 mg | ORAL_TABLET | ORAL | Status: DC | PRN
  Administered 2022-03-25 – 2022-03-27 (×4): 650 mg via ORAL
  Filled 2022-03-25 (×4): qty 2

## 2022-03-25 MED ORDER — HEPARIN SODIUM (PORCINE) 1000 UNIT/ML IJ SOLN
INTRAMUSCULAR | Status: AC
  Filled 2022-03-25: qty 10

## 2022-03-25 MED ORDER — MORPHINE SULFATE (PF) 2 MG/ML IV SOLN
2.0000 mg | INTRAVENOUS | Status: DC | PRN
  Administered 2022-03-25 (×2): 2 mg via INTRAVENOUS
  Filled 2022-03-25 (×2): qty 1

## 2022-03-25 MED ORDER — HYDRALAZINE HCL 20 MG/ML IJ SOLN
10.0000 mg | INTRAMUSCULAR | Status: AC | PRN
Start: 2022-03-25 — End: 2022-03-25

## 2022-03-25 MED ORDER — LIDOCAINE HCL (PF) 1 % IJ SOLN
INTRAMUSCULAR | Status: AC
  Filled 2022-03-25: qty 30

## 2022-03-25 MED ORDER — LABETALOL HCL 5 MG/ML IV SOLN
10.0000 mg | INTRAVENOUS | Status: AC | PRN
Start: 2022-03-25 — End: 2022-03-25

## 2022-03-25 MED ORDER — HEPARIN SODIUM (PORCINE) 1000 UNIT/ML IJ SOLN
INTRAMUSCULAR | Status: DC | PRN
  Administered 2022-03-25: 4500 [IU] via INTRAVENOUS

## 2022-03-25 MED ORDER — SODIUM CHLORIDE 0.9 % IV SOLN: 250.0000 mL | INTRAVENOUS | Status: DC | PRN

## 2022-03-25 MED ORDER — LIDOCAINE HCL (PF) 1 % IJ SOLN
INTRAMUSCULAR | Status: DC | PRN
  Administered 2022-03-25: 2 mL

## 2022-03-25 MED ORDER — VERAPAMIL HCL 2.5 MG/ML IV SOLN
INTRAVENOUS | Status: DC | PRN
  Administered 2022-03-25: 10 mL via INTRA_ARTERIAL

## 2022-03-25 MED ORDER — SODIUM CHLORIDE 0.9 % IV SOLN: INTRAVENOUS | Status: AC

## 2022-03-25 MED ORDER — SODIUM CHLORIDE 0.9% FLUSH
3.0000 mL | Freq: Two times a day (BID) | INTRAVENOUS | Status: DC
  Administered 2022-03-27 (×2): 3 mL via INTRAVENOUS

## 2022-03-25 MED ORDER — HEPARIN (PORCINE) IN NACL 1000-0.9 UT/500ML-% IV SOLN
INTRAVENOUS | Status: AC
  Filled 2022-03-25: qty 1000

## 2022-03-25 SURGICAL SUPPLY — 11 items
BAND ZEPHYR COMPRESS 30 LONG (HEMOSTASIS) ×1 IMPLANT
CATH EXPO 5F MPA-1 (CATHETERS) ×1 IMPLANT
CATH INFINITI 5FR MULTPACK ANG (CATHETERS) ×1 IMPLANT
GLIDESHEATH SLEND SS 6F .021 (SHEATH) ×1 IMPLANT
GUIDEWIRE INQWIRE 1.5J.035X260 (WIRE) IMPLANT
INQWIRE 1.5J .035X260CM (WIRE) ×2
KIT HEART LEFT (KITS) ×2 IMPLANT
PACK CARDIAC CATHETERIZATION (CUSTOM PROCEDURE TRAY) ×2 IMPLANT
TRANSDUCER W/STOPCOCK (MISCELLANEOUS) ×2 IMPLANT
TUBING CIL FLEX 10 FLL-RA (TUBING) ×2 IMPLANT
WIRE HI TORQ VERSACORE-J 145CM (WIRE) ×1 IMPLANT

## 2022-03-25 NOTE — H&P (View-Only) (Signed)
Progress Note  Patient Name: Ryan Herrera Date of Encounter: 03/25/2022  [facility] Cardiologist: Janina Mayo, MD   Subjective   Intermittent chest pain overnight. Had another episode about 30 minutes ago, improving with up titration of nitro drip. Update EKG pending.   Inpatient Medications    Scheduled Meds:  allopurinol  100 mg Oral QHS   aspirin EC  81 mg Oral Daily   empagliflozin  10 mg Oral q morning   ezetimibe  10 mg Oral Daily   insulin aspart  0-15 Units Subcutaneous TID WC   insulin glargine-yfgn  20 Units Subcutaneous Daily   levothyroxine  100 mcg Oral Q0600   pantoprazole  40 mg Oral Daily   sodium chloride flush  3 mL Intravenous Q12H   Continuous Infusions:  sodium chloride     sodium chloride 1 mL/kg/hr (03/25/22 0512)   heparin 1,150 Units/hr (03/25/22 0514)   nitroGLYCERIN 65 mcg/min (03/25/22 0754)   PRN Meds: sodium chloride, acetaminophen, nitroGLYCERIN, nitroGLYCERIN, ondansetron (ZOFRAN) IV, sodium chloride flush   Vital Signs    Vitals:   03/25/22 0220 03/25/22 0505 03/25/22 0743 03/25/22 0749  BP: 131/62 133/62 97/67 114/62  Pulse: 85 86 78   Resp: 18 18    Temp:  98.1 F (36.7 C)    TempSrc:  Oral    SpO2: 97% 96% 93%   Weight:      Height:        Intake/Output Summary (Last 24 hours) at 03/25/2022 0811 Last data filed at 03/25/2022 0746 Gross per 24 hour  Intake 280 ml  Output 400 ml  Net -120 ml      03/22/2022    3:00 PM 03/19/2022    1:27 PM 03/12/2022   11:38 AM  Last 3 Weights  Weight (lbs) 198 lb 198 lb 12.8 oz 200 lb 9.6 oz  Weight (kg) 89.812 kg 90.175 kg 90.992 kg      Telemetry    NSR - Personally Reviewed  ECG    Pending  Physical Exam   GEN: No acute distress.   Neck: No JVD Cardiac: RRR, no murmurs, rubs, or gallops.  Respiratory: Clear to auscultation bilaterally. GI: Soft, nontender, non-distended  MS: No edema; No deformity. Neuro:  Nonfocal  Psych: Normal affect   Labs     High Sensitivity Troponin:   Recent Labs  Lab 03/22/22 1517 03/22/22 1659 03/23/22 0456  TROPONINIHS 283* 421* 1,667*     Chemistry Recent Labs  Lab 03/22/22 1517 03/23/22 0110 03/24/22 0743 03/25/22 0322  NA 136 137 135 132*  K 4.2 3.6 3.6 3.8  CL 104 105 102 100  CO2 '24 24 24 22  '$ GLUCOSE 216* 78 131* 175*  BUN 25* 25* 23 25*  CREATININE 1.13 1.16 1.20 1.31*  CALCIUM 9.5 8.7* 9.0 8.9  PROT 7.3  --   --   --   ALBUMIN 3.9  --   --   --   AST 23  --   --   --   ALT 22  --   --   --   ALKPHOS 57  --   --   --   BILITOT 0.5  --   --   --   GFRNONAA >60 >60 >60 56*  ANIONGAP '8 8 9 10    '$ Lipids No results for input(s): "CHOL", "TRIG", "HDL", "LABVLDL", "LDLCALC", "CHOLHDL" in the last 168 hours.  Hematology Recent Labs  Lab 03/22/22 1517 03/24/22 7628 03/25/22 3151  WBC 6.8 7.9 8.3  RBC 4.82 4.23 4.01*  HGB 14.8 12.9* 12.4*  HCT 44.9 39.0 37.3*  MCV 93.2 92.2 93.0  MCH 30.7 30.5 30.9  MCHC 33.0 33.1 33.2  RDW 13.8 13.7 13.5  PLT 232 213 211   Thyroid  Recent Labs  Lab 03/24/22 0743  TSH 2.349  FREET4 1.14*    BNPNo results for input(s): "BNP", "PROBNP" in the last 168 hours.  DDimer No results for input(s): "DDIMER" in the last 168 hours.   Radiology    ECHOCARDIOGRAM COMPLETE  Result Date: 03/23/2022    ECHOCARDIOGRAM REPORT   Patient Name:   Ryan Herrera Date of Exam: 03/23/2022 Medical Rec #:  [id number]                 Height:       66.5 in Accession #:    [id number]                Weight:       198.0 lb Date of Birth:  09/27/1946                 BSA:          2.002 m Patient Age:    76 years                  BP:           124/71 mmHg Patient Gender: M                         HR:           67 bpm. Exam Location:  Inpatient Procedure: 2D Echo, Cardiac Doppler and Color Doppler Indications:    NSTEMI  History:        Patient has no prior history of Echocardiogram examinations.  Sonographer:    Joette Catching RCS Referring Phys: [id number]  [place]  1. Left ventricular ejection fraction, by estimation, is 60 to 65%. The left ventricle has normal function. The left ventricle has no regional wall motion abnormalities. There is mild concentric left ventricular hypertrophy. Left ventricular diastolic parameters are consistent with Grade I diastolic dysfunction (impaired relaxation).  2. Right ventricular systolic function is normal. The right ventricular size is normal. There is normal pulmonary artery systolic pressure. The estimated right ventricular systolic pressure is 96.2 mmHg.  3. The mitral valve is grossly normal. No evidence of mitral valve regurgitation. No evidence of mitral stenosis. The mean mitral valve gradient is 2.0 mmHg. Moderate mitral annular calcification.  4. The aortic valve is tricuspid. There is moderate calcification of the aortic valve. There is moderate thickening of the aortic valve. Aortic valve regurgitation is not visualized. Aortic valve sclerosis/calcification is present, without any evidence of aortic stenosis. Aortic valve mean gradient measures 5.0 mmHg. Normal LV stroke volume.  5. Aortic dilatation noted. There is mild dilatation of the aortic root, measuring 40 mm. There is mild dilatation of the ascending aorta, measuring 41 mm. Repeat imaging recommended in one year if clinically indicated.  6. The inferior vena cava is normal in size with greater than 50% respiratory variability, suggesting right atrial pressure of 3 mmHg. Comparison(s): No prior Echocardiogram. FINDINGS  Left Ventricle: Calcified papillary muscle. Left ventricular ejection fraction, by estimation, is 60 to 65%. The left ventricle has normal function. The left ventricle has no regional wall motion abnormalities. The left ventricular internal cavity size was normal in size.  There is mild concentric left ventricular hypertrophy. Left ventricular diastolic parameters are consistent with Grade I diastolic dysfunction (impaired  relaxation). Right Ventricle: The right ventricular size is normal. No increase in right ventricular wall thickness. Right ventricular systolic function is normal. There is normal pulmonary artery systolic pressure. The tricuspid regurgitant velocity is 2.24 m/s, and  with an assumed right atrial pressure of 3 mmHg, the estimated right ventricular systolic pressure is 54.6 mmHg. Left Atrium: Left atrial size was normal in size. Right Atrium: Right atrial size was normal in size. Pericardium: There is no evidence of pericardial effusion. Presence of epicardial fat layer. Mitral Valve: MVA by 2d Planimetry. The mitral valve is grossly normal. Moderate mitral annular calcification. No evidence of mitral valve regurgitation. No evidence of mitral valve stenosis. The mean mitral valve gradient is 2.0 mmHg with average heart rate of 64 bpm. Tricuspid Valve: The tricuspid valve is normal in structure. Tricuspid valve regurgitation is trivial. No evidence of tricuspid stenosis. Aortic Valve: The aortic valve is tricuspid. There is moderate calcification of the aortic valve. There is moderate thickening of the aortic valve. There is moderate aortic valve annular calcification. Aortic valve regurgitation is not visualized. Aortic  valve sclerosis/calcification is present, without any evidence of aortic stenosis. Aortic valve mean gradient measures 5.0 mmHg. Aortic valve peak gradient measures 8.8 mmHg. Aortic valve area, by VTI measures 2.45 cm. Pulmonic Valve: The pulmonic valve was normal in structure. Pulmonic valve regurgitation is mild. No evidence of pulmonic stenosis. Aorta: Aortic dilatation noted. There is mild dilatation of the aortic root, measuring 40 mm. There is mild dilatation of the ascending aorta, measuring 41 mm. Venous: The inferior vena cava is normal in size with greater than 50% respiratory variability, suggesting right atrial pressure of 3 mmHg. IAS/Shunts: No atrial level shunt detected by color flow  Doppler.  LEFT VENTRICLE PLAX 2D LVIDd:         4.70 cm   Diastology LVIDs:         3.00 cm   LV e' medial:    7.29 cm/s LV PW:         1.10 cm   LV E/e' medial:  13.1 LV IVS:        1.20 cm   LV e' lateral:   10.10 cm/s LVOT diam:     2.00 cm   LV E/e' lateral: 9.5 LV SV:         73 LV SV Index:   36 LVOT Area:     3.14 cm  RIGHT VENTRICLE            IVC RV Basal diam:  3.60 cm    IVC diam: 1.60 cm RV Mid diam:    2.30 cm RV S prime:     9.90 cm/s TAPSE (M-mode): 1.6 cm LEFT ATRIUM             Index        RIGHT ATRIUM           Index LA diam:        3.70 cm 1.85 cm/m   RA Area:     11.40 cm LA Vol (A2C):   35.5 ml 17.73 ml/m  RA Volume:   22.40 ml  11.19 ml/m LA Vol (A4C):   48.6 ml 24.27 ml/m LA Biplane Vol: 42.1 ml 21.02 ml/m  AORTIC VALVE  PULMONIC VALVE AV Area (Vmax):    2.33 cm      PV Vmax:          0.96 m/s AV Area (Vmean):   2.21 cm      PV Peak grad:     3.7 mmHg AV Area (VTI):     2.45 cm      PR End Diast Vel: 4.93 msec AV Vmax:           148.00 cm/s AV Vmean:          112.000 cm/s AV VTI:            0.297 m AV Peak Grad:      8.8 mmHg AV Mean Grad:      5.0 mmHg LVOT Vmax:         110.00 cm/s LVOT Vmean:        78.800 cm/s LVOT VTI:          0.232 m LVOT/AV VTI ratio: 0.78  AORTA Ao Root diam: 4.00 cm Ao Asc diam:  4.00 cm MITRAL VALVE                TRICUSPID VALVE MV Area (PHT): 3.01 cm     TR Peak grad:   20.1 mmHg MV Mean grad:  2.0 mmHg     TR Vmax:        224.00 cm/s MV Decel Time: 252 msec MV E velocity: 95.50 cm/s   SHUNTS MV A velocity: 105.00 cm/s  Systemic VTI:  0.23 m MV E/A ratio:  0.91         Systemic Diam: 2.00 cm Rudean Haskell MD Electronically signed by Rudean Haskell MD Signature Date/Time: 03/23/2022/1:17:02 PM    Final     Cardiac Studies   2d echo 03/23/22  1. Left ventricular ejection fraction, by estimation, is 60 to 65%. The  left ventricle has normal function. The left ventricle has no regional  wall motion abnormalities. There  is mild concentric left ventricular  hypertrophy. Left ventricular diastolic  parameters are consistent with Grade I diastolic dysfunction (impaired  relaxation).   2. Right ventricular systolic function is normal. The right ventricular  size is normal. There is normal pulmonary artery systolic pressure. The  estimated right ventricular systolic pressure is 86.7 mmHg.   3. The mitral valve is grossly normal. No evidence of mitral valve  regurgitation. No evidence of mitral stenosis. The mean mitral valve  gradient is 2.0 mmHg. Moderate mitral annular calcification.   4. The aortic valve is tricuspid. There is moderate calcification of the  aortic valve. There is moderate thickening of the aortic valve. Aortic  valve regurgitation is not visualized. Aortic valve  sclerosis/calcification is present, without any evidence  of aortic stenosis. Aortic valve mean gradient measures 5.0 mmHg. Normal  LV stroke volume.   5. Aortic dilatation noted. There is mild dilatation of the aortic root,  measuring 40 mm. There is mild dilatation of the ascending aorta,  measuring 41 mm. Repeat imaging recommended in one year if clinically  indicated.   6. The inferior vena cava is normal in size with greater than 50%  respiratory variability, suggesting right atrial pressure of 3 mmHg.   Comparison(s): No prior Echocardiogram.    Patient Profile     76 y.o. male with hx of CAD s/p CABG (09/2007 at [facility], normal NST in 12/2012) hypertension, hyperlipidemia, diabetes, and prior tobacco abuse admitted with NSTEMI.  Transferred from [facility] to [facility]  for cath.  Assessment & Plan    1. CAD s/p prior CABG, here with NSTEMI - Intermittent  chest pain overnight, currently improving with up titration of nitro drip.  - BB stopped yesterday 2nd to Mobitz 1 - for cath later today  - Continue ASA, heparin, Zetia (on Repatha as OP)  2. Essential HTN - stop metoprolol as above - adjusted med orders to  hold HCTZ/Losartan in prep for cath given slight bump in Scr - BP stable    3. Hyperlipidemia - last LDL 63 on 03/12/22, continue Zetia, Repatha as OP (statin intolerant)   4. Hypothyroidism - last TSH 03/12/22 was 5.760, started on levothyroxine 123mg daily by primary care with plan for follow-up visit in 6 weeks - Lab with Normal TSH at 2.3, TSH 1.14 >> follow up with PCP post discharge    5. Diabetes mellitus - SSI   6. Finger injury - patient reports he was instructed to keep dressing on until skin begins to heal back over - continue OP f/u with hand surgery  7. Mobitz 1  - No reoccurance with holding BB  8. AKI - Hydrate. Hold HCTZ and losartan    For questions or updates, please contact [place]Please consult [email or web address] for contact info under        Signed, BLeanor Kail PA  03/25/2022, 8:11 AM     I have personally seen and examined this patient. I agree with the assessment and plan as outlined above.  Pt admitted with a NSTEMI. History of CAD with prior CABG. I agree that a cardiac cath is indicated. Continue IV NTG and IV heparin while awaiting cardiac cath. Labs reviewed and ok for cath today. All questions answered.   CLauree Chandler6/19/2023 10:36 AM

## 2022-03-25 NOTE — Progress Notes (Signed)
Patient ID: Ryan Herrera, male   DOB: 06-15-1946, 76 y.o.   MRN: [id number]   LOS: 3 days   Subjective: Asked to see pt for wound care while he is hospitalized. He slammed his finger in a car door 3w ago. He has seen Dr. Amedeo Plenty who is treating it conservatively.   Objective: Vital signs in last 24 hours: Temp:  [97.7 F (36.5 C)-98.1 F (36.7 C)] 98 F (36.7 C) (06/19 1228) Pulse Rate:  [70-88] 78 (06/19 0743) Resp:  [18-20] 18 (06/19 0505) BP: (97-137)/(59-69) 109/64 (06/19 1038) SpO2:  [92 %-97 %] 96 % (06/19 1117) Last BM Date : 03/22/22   Laboratory  CBC Recent Labs    03/24/22 0743 03/25/22 0322  WBC 7.9 8.3  HGB 12.9* 12.4*  HCT 39.0 37.3*  PLT 213 211   BMET Recent Labs    03/24/22 0743 03/25/22 0322  NA 135 132*  K 3.6 3.8  CL 102 100  CO2 24 22  GLUCOSE 131* 175*  BUN 23 25*  CREATININE 1.20 1.31*  CALCIUM 9.0 8.9     Physical Exam General appearance: alert and no distress Right ring finger -- Partial amputation, starting to show some granulation and slough. No signs of infection.   Assessment/Plan: Right ring finger amputation -- Continue wound care as directed by Dr. Amedeo Plenty with Bacitracin ointment, non-adherent dressing, and dry gauze bid.    Lisette Abu, PA-C Orthopedic Surgery [phone] 03/25/2022

## 2022-03-25 NOTE — Consult Note (Addendum)
[address]ates[address]       [place] [address]             [phone]        Casmir Graham [place] Record #[id number] Date of Birth: 29-Feb-1946  Referring: Dr. Angelena Form, MD Primary Care: Renee Rival, FNP Primary Cardiologist:Branch, Royetta Crochet, MD  Chief Complaint:    Chief Complaint  Patient presents with   Chest Pain    History of Present Illness:     This is a 76 year old male with a past medical history of coronary artery disease (s/p CABG x 3 at [facility] in 2008), essential hypertension, hyperlipidemia, remote tobacco abuse, DM, hypothyroidism who presented to [facility] on 03/22/2022 with complaints of worsening chest pain, radiating to his left arm, and dyspnea on exertion. He presented previously with similar symptoms in May and it was felt symptoms then were due to acid reflux as pain improved with GI cocktail. Patient does endorse he has had increased stress of late (recent move, building a deck etc). EKG showed sinus rhythm, first degree heart block, and no ST changes. Initial Troponin I was 283 with a max of 1,667. He ruled in for a NSTEMI. He was put on Nitro and Heparin drips. Echo done 03/23/2022 showed LVEF 60-65%, aortic sclerosis but no significant valvular disease, and mild aortic root dilatation at 40 mm with mild dilatation of the ascending aorta to 41 mm. A cardiac catheterization was done that showed LVEF 55-65%, mid LAD 100% stenosis, origin to proximal graft 100% stenosed, Ramus with an 80% stenosis, and proximal RCA with a 90% stenosis. A cardiothoracic consultation was requested with Dr. Roxan Hockey. At the time of my exam, patient is somewhat "groggy" after cardiac catheterization. Family is at bedside. Patient denies chest pain or shortness of breath.  Current Activity/ Functional Status: Patient is independent with mobility/ambulation, transfers, ADL's, IADL's.   Zubrod Score: At the time of surgery this  patient's most appropriate activity status/level should be described as: '[]'$     0    Normal activity, no symptoms '[x]'$     1    Restricted in physical strenuous activity but ambulatory, able to do out light work '[]'$     2    Ambulatory and capable of self care, unable to do work activities, up and about more than 50%  Of the time                            '[]'$     3    Only limited self care, in bed greater than 50% of waking hours '[]'$     4    Completely disabled, no self care, confined to bed or chair '[]'$     5    Moribund  Past Medical History:  Diagnosis Date   Arthritis    hands   Coronary artery disease    Diabetes mellitus without complication ([name])    GERD (gastroesophageal reflux disease)    H/O poliomyelitis    age 64.  now has "weak stomach muscles"   Hypertension    IBS (irritable bowel syndrome)    Iron deficiency anemia 09/22/2018   Renal insufficiency    Thyroid disease     Past Surgical History:  Procedure Laterality Date   BACK SURGERY  1967   benign tumor removal  2004   CORONARY ARTERY BYPASS GRAFT  2008   [facility] -  3 vessel   CYSTOSCOPY WITH INSERTION OF UROLIFT     HERNIA REPAIR Bilateral    inguinal   HYDROCELE EXCISION / REPAIR     JOINT REPLACEMENT     left knee x2, rright x1   PROSTATE SURGERY N/A 01/2021   TONSILLECTOMY      Social History   Tobacco Use  Smoking Status Former   Packs/day: 1.00   Years: 38.00   Total pack years: 38.00   Types: Cigarettes   Quit date: 12/1996   Years since quitting: 25.3  Smokeless Tobacco Never    Social History   Substance and Sexual Activity  Alcohol Use Yes   Comment: occassionally     Allergies  Allergen Reactions   Myrbetriq [Mirabegron] Other (See Comments)    Unknown reaction   Niacin And Related Itching   Other Diarrhea and Nausea And Vomiting    PT does not want OPIOIDS   GLP-1 drugs - Nausea, vomiting, diarrhea, belching   Statins Itching    Current Facility-Administered Medications   Medication Dose Route Frequency Provider Last Rate Last Admin   0.9 %  sodium chloride infusion   Intravenous Continuous Jettie Booze, MD 100 mL/hr at 03/25/22 1229 Rate Change at 03/25/22 1229   0.9 %  sodium chloride infusion  250 mL Intravenous PRN Jettie Booze, MD       acetaminophen (TYLENOL) tablet 650 mg  650 mg Oral Q4H PRN Jettie Booze, MD       allopurinol (ZYLOPRIM) tablet 100 mg  100 mg Oral QHS Jettie Booze, MD   100 mg at 03/24/22 2143   aspirin EC tablet 81 mg  81 mg Oral Daily Jettie Booze, MD   81 mg at 03/25/22 1962   empagliflozin (JARDIANCE) tablet 10 mg  10 mg Oral q morning Jettie Booze, MD   10 mg at 03/25/22 0942   ezetimibe (ZETIA) tablet 10 mg  10 mg Oral Daily Jettie Booze, MD   10 mg at 03/25/22 0941   hydrALAZINE (APRESOLINE) injection 10 mg  10 mg Intravenous Q20 Min PRN Jettie Booze, MD       insulin aspart (novoLOG) injection 0-15 Units  0-15 Units Subcutaneous TID WC Jettie Booze, MD   3 Units at 03/25/22 0817   insulin glargine-yfgn (SEMGLEE) injection 20 Units  20 Units Subcutaneous Daily Jettie Booze, MD       labetalol (NORMODYNE) injection 10 mg  10 mg Intravenous Q10 min PRN Jettie Booze, MD       levothyroxine (SYNTHROID) tablet 100 mcg  100 mcg Oral Q0600 Jettie Booze, MD   100 mcg at 03/25/22 0514   morphine (PF) 2 MG/ML injection 2 mg  2 mg Intravenous Q2H PRN Jettie Booze, MD   2 mg at 03/25/22 0818   nitroGLYCERIN (NITROSTAT) SL tablet 0.4 mg  0.4 mg Sublingual Q5 Min x 3 PRN Larae Grooms S, MD   0.4 mg at 03/22/22 2306   nitroGLYCERIN (NITROSTAT) SL tablet 0.4 mg  0.4 mg Sublingual Q5 min PRN Jettie Booze, MD   0.4 mg at 03/22/22 2052   nitroGLYCERIN 50 mg in dextrose 5 % 250 mL (0.2 mg/mL) infusion  5-200 mcg/min Intravenous Continuous Jettie Booze, MD 19.5 mL/hr at 03/25/22 0817 65 mcg/min at 03/25/22 0817   ondansetron  (ZOFRAN) injection 4 mg  4 mg Intravenous Q6H PRN Jettie Booze, MD  pantoprazole (PROTONIX) EC tablet 40 mg  40 mg Oral Daily Jettie Booze, MD   40 mg at 03/25/22 0941   sodium chloride flush (NS) 0.9 % injection 3 mL  3 mL Intravenous Q12H Jettie Booze, MD   3 mL at 03/23/22 0923   sodium chloride flush (NS) 0.9 % injection 3 mL  3 mL Intravenous Q12H Jettie Booze, MD       sodium chloride flush (NS) 0.9 % injection 3 mL  3 mL Intravenous PRN Jettie Booze, MD        Medications Prior to Admission  Medication Sig Dispense Refill Last Dose   allopurinol (ZYLOPRIM) 100 MG tablet Take 1 tablet (100 mg total) by mouth daily. 30 tablet 3 Past Week   aspirin EC 81 MG tablet Take 81 mg by mouth daily.   03/22/2022   Cholecalciferol (VITAMIN D-3 PO) Take 1 capsule by mouth daily.   03/22/2022   Cyanocobalamin (VITAMIN B-12 PO) Take 1 tablet by mouth daily.   03/22/2022   doxycycline (VIBRAMYCIN) 100 MG capsule Take 100 mg by mouth 2 (two) times daily. Continuous course.   Past Week   Evolocumab (REPATHA SURECLICK) 716 MG/ML SOAJ Inject 140 mg into the skin every 14 (fourteen) days. 2 mL 3 03/22/2022   ezetimibe (ZETIA) 10 MG tablet Take 1 tablet (10 mg total) by mouth daily. 90 tablet 3 03/22/2022   glipiZIDE (GLUCOTROL XL) 5 MG 24 hr tablet TAKE 1 TABLET BY MOUTH EVERY DAY WITH BREAKFAST (Patient taking differently: Take 5 mg by mouth daily with breakfast.) 90 tablet 0 03/22/2022   hydrochlorothiazide (HYDRODIURIL) 25 MG tablet Take 1 tablet (25 mg total) by mouth daily. 90 tablet 3 03/22/2022   insulin degludec (TRESIBA FLEXTOUCH) 100 UNIT/ML FlexTouch Pen Inject 20 Units into the skin at bedtime. 15 mL 1 Past Week   JARDIANCE 10 MG TABS tablet Take 1 tablet (10 mg total) by mouth every morning. 30 tablet 3 03/22/2022   ketoconazole (NIZORAL) 2 % shampoo Apply 1 Application topically See admin instructions. Uses a small amount with every shower   Past Week    levothyroxine (SYNTHROID) 100 MCG tablet TAKE 1 TABLET BY MOUTH DAILY BEFORE BREAKFAST. (Patient taking differently: Take 100 mcg by mouth daily before breakfast.) 90 tablet 0 03/22/2022   losartan (COZAAR) 25 MG tablet Take 25 mg by mouth daily.   03/22/2022   MAG64 64 MG TBEC Take 1 tablet by mouth daily.   03/22/2022   metFORMIN (GLUCOPHAGE) 1000 MG tablet Take 1,000 mg by mouth 2 (two) times daily with a meal.   03/22/2022   pantoprazole (PROTONIX) 40 MG tablet Take 1 tablet (40 mg total) by mouth daily. 90 tablet 3 03/22/2022   polyethylene glycol (MIRALAX / GLYCOLAX) 17 g packet Take 17 g by mouth daily as needed for mild constipation.   unk   tadalafil (CIALIS) 5 MG tablet Take 5 mg by mouth daily.   03/22/2022   valACYclovir (VALTREX) 500 MG tablet Take 500 mg by mouth 2 (two) times daily.   03/22/2022   BD PEN NEEDLE NANO U/F 32G X 4 MM MISC       glucose blood (FREESTYLE PRECISION NEO TEST) test strip Use to test glucose 2 times daily. 100 each 2    ONETOUCH VERIO test strip USE AS DIRECTED 4 TIMES A DAY       Family History  Problem Relation Age of Onset   Hypertension Mother    Heart  attack Mother    Osteoarthritis Mother    Stroke Father    Hypertension Father    Stroke Sister    Stroke Sister    Colon cancer Neg Hx    Prostate cancer Neg Hx    Cancer - Lung Neg Hx    Review of Systems:     Cardiac Review of Systems: Y or  [  N  ]= no  Chest Pain [  Y-on admission  ]   Exertional SOB  [ Y ]  Pedal Edema [  N ]     Syncope  [N  ]     General Review of Systems: [Y] = yes [ N ]=no Constitional: fatigue [  ]; nausea [ N ]; night sweats [ N ]; fever [ N ]; or chills [  N]                                                                Eye : blurred vision [ N ]; diplopia [ N  ]; Resp: cough [ N ];  wheezing[ N ];  hemoptysis[N  ];  GI:  vomiting[ N ];  melena[ N ];  hematochezia Aqua.Slicker  ]; GU:  hematuria[  N];                Skin: rash, swelling[ N ];,  Musculosketetal: myalgias[  ];   joint swelling[  ];  joint erythema[  ]; joint pain[  ];  back pain[  ];  Heme/Lymph: anemia[  Y];  Neuro: TIA[ N ];  stroke[ N ];    Endocrine: diabetes[Y  ];  thyroid dysfunction[ Y ];             Has had multiple vaccines for COVID      Physical Exam: BP 109/64   Pulse 78   Temp 98 F (36.7 C) (Oral)   Resp 18   Ht 5' 6.5" (1.689 m)   Wt 89.8 kg   SpO2 96%   BMI 31.48 kg/m    General appearance: cooperative and no distress Head: Normocephalic, without obvious abnormality, atraumatic Neck: no JVD, supple, symmetrical, trachea midline, and Faint bruit heard left side of neck Resp: clear to auscultation bilaterally Cardio: RRR, no murmur GI: Soft, non tender, bowel sounds present Extremities: No LE edema. Saphenous vein removed from left leg. Right ring finger has bandage on it Neurologic: Grossly normal  Diagnostic Studies & Laboratory data:     Recent Radiology Findings:   CARDIAC CATHETERIZATION  Result Date: 03/25/2022   Ramus lesion is 80% stenosed.   Lat Ramus lesion is 80% stenosed.   Mid Cx lesion is 75% stenosed.  SVG to OM is occulded.   LPAV lesion is 90% stenosed.   Mid LAD lesion is 100% stenosed. LIMA to LAD is patent.   Origin to Prox Graft lesion is 100% stenosed.   Origin to Prox Graft lesion is 100% stenosed.   Prox RCA lesion is 90% stenosed with 90% stenosed side branch in 1st RPL.  SVG to PDA is occluded.   The left ventricular systolic function is normal.   LV end diastolic pressure is normal.   The left ventricular ejection fraction is 55-65% by visual estimate.   There is no aortic valve stenosis.  Severe disease of the circumflex and branches and well as RCA and branches.  RCA has a sharp angulation and has several branches that arise proximally that would make PCI difficult.  Severe calcific disease in both branches of a ramus vessel and in the left PDA.  SVG to OM and SVG to PDA are occluded.  Consult surgery for possible redo CABG. Prior surgery was done  at [facility] in 2008.    Diagnostic Dominance: Co-dominant     I have independently reviewed the above radiologic studies and discussed with the patient   Recent Lab Findings: Lab Results  Component Value Date   WBC 8.3 03/25/2022   HGB 12.4 (L) 03/25/2022   HCT 37.3 (L) 03/25/2022   PLT 211 03/25/2022   GLUCOSE 175 (H) 03/25/2022   CHOL 120 03/12/2022   TRIG 94 03/12/2022   HDL 39 (L) 03/12/2022   LDLCALC 63 03/12/2022   ALT 22 03/22/2022   AST 23 03/22/2022   NA 132 (L) 03/25/2022   K 3.8 03/25/2022   CL 100 03/25/2022   CREATININE 1.31 (H) 03/25/2022   BUN 25 (H) 03/25/2022   CO2 22 03/25/2022   TSH 2.349 03/24/2022   INR 1.0 03/22/2022   HGBA1C 7.6 (H) 03/23/2022   Assessment / Plan:   S/p NSTEMI, coronary artery disease-on Nitro drip. Dr. Roxan Hockey to evaluate for re do sternotomy, coronary artery bypass grafting surgery. Mild aortic root dilatation at 40 mm with mild dilatation of the ascending aorta to 41 mm.  3. History of essential hypertension-He was on Losartan 25 mg daily prior to admission 4. History of hyperlipidemia-on Zetia 10 mg daily 5. History of hypothyroidism-on Levothyroxine 100 mcg daily 6. History of DM-HGA1C 7.6. On Jardiance 10 mg daily. He was also on Metformin, Glucotrol XL, and Insulin. He will need medical follow up after discharge 7. AKI-creatinine this am up to 1.31. Avoid ACE/ARB;possibly related to catheterization as creatinine upon admission was 1.13. Re check in am 8. Right ring finger injury-shut tip of finger in door. He has seen Dr. Amedeo Plenty and per patient's wife, dressing to be changed bid. Mr. Jacqulynn Cadet with orthopedics will evaluate.  I  spent 20 minutes counseling the patient face to face.   Lars Pinks PA-C 03/25/2022 12:46 PM  I have seen and examined Mr. Smeltzer and reviewed his records and catheterization images.  76 year old male with multiple cardiac risk factors and known CAD.  Status post CABG x3 at [facility] in 2008.   Now presents with accelerating angina and ruled in for non-ST elevation MI.  Catheterization shows a patent left mammary to LAD with severe native three-vessel disease and occluded vein grafts to the ramus intermedius and posterior descending.  Potential surgical targets include the larger ramus branch, posterior descending, and possibly the acute marginal.  The distal OM was not large enough to graft at his last operation and based on catheterization images would not be graftable in this instance either.  I discussed the possibility of redo coronary bypass grafting with Mr. Ion.  I informed him of the general nature of the procedure.  He and his family understand that redo surgery is more complicated and carries higher risk than his original surgery.  I informed them of the need for general anesthesia, the incisions to be used, use of cardiopulmonary bypass, use of drainage tubes and temporary pacemaker wires postoperatively, the expected hospital stay, and the overall recovery.  They understand the risks include, but not limited to death, stroke, MI, DVT,  PE, bleeding, possible need for transfusion, infection, cardiac arrhythmias, respiratory failure, renal failure, as well as possibility of other unforeseeable complications.  We may be able to adjust the schedule and do him on Thursday.  He may choose to return to [facility] for his redo surgery.  If he would like to do so, we will be happy to assist him.Remo Lipps C. Roxan Hockey, MD [facility] [phone]

## 2022-03-25 NOTE — Progress Notes (Signed)
ANTICOAGULATION CONSULT NOTE - Follow Up Consult  Pharmacy Consult for IV heparin Indication: NSTEMI  Allergies  Allergen Reactions   Myrbetriq [Mirabegron] Other (See Comments)    Unknown reaction   Niacin And Related Itching   Other Diarrhea and Nausea And Vomiting    PT does not want OPIOIDS   GLP-1 drugs - Nausea, vomiting, diarrhea, belching   Statins Itching    Patient Measurements: Height: 5' 6.5" (168.9 cm) Weight: 89.8 kg (198 lb) IBW/kg (Calculated) : 64.95 Heparin Dosing Weight: 83.8 kg   Vital Signs: Temp: 98.1 F (36.7 C) (06/19 0505) Temp Source: Oral (06/19 0505) BP: 114/62 (06/19 0749) Pulse Rate: 78 (06/19 0743)  Labs: Recent Labs    03/22/22 1517 03/22/22 1517 03/22/22 1659 03/23/22 0110 03/23/22 0456 03/23/22 0757 03/24/22 0743 03/25/22 0322  HGB 14.8  --   --   --   --   --  12.9* 12.4*  HCT 44.9  --   --   --   --   --  39.0 37.3*  PLT 232  --   --   --   --   --  213 211  APTT 28  --   --   --   --   --   --   --   LABPROT 12.7  --   --   --   --   --   --   --   INR 1.0  --   --   --   --   --   --   --   HEPARINUNFRC  --    < >  --  0.45  --  0.49 0.36 0.12*  CREATININE 1.13  --   --  1.16  --   --  1.20 1.31*  TROPONINIHS 283*  --  421*  --  1,667*  --   --   --    < > = values in this interval not displayed.     Estimated Creatinine Clearance: 49.2 mL/min (A) (by C-G formula based on SCr of 1.31 mg/dL (H)).  Assessment: 73 YOM with a PMH of CAD (s/p CABG in 09/2007 at [facility], normal NST in 12/2012) who presented to [facility] ED for chest pain. Transferred to [facility] for management of NSTEMI. Not on anticoagulation PTA.   Heparin level subtherapeutic at 0.12, CBC stable, infusing at 1150 units/h.  Goal of Therapy:  Heparin level 0.3-0.7 units/ml Monitor platelets by anticoagulation protocol: Yes   Plan:  Increase heparin to 1350 units/h Cath later today - defer recheck heparin level  Arrie Senate, PharmD, BCPS, [facility] Clinical  Pharmacist [phone] Please check AMION for all [facility] Pharmacy numbers 03/25/2022

## 2022-03-25 NOTE — Interval H&P Note (Signed)
Cath Lab Visit (complete for each Cath Lab visit)  Clinical Evaluation Leading to the Procedure:   ACS: Yes.    Non-ACS:    Anginal Classification: CCS IV  Anti-ischemic medical therapy: Minimal Therapy (1 class of medications)  Non-Invasive Test Results: No non-invasive testing performed  Prior CABG: Previous CABG      History and Physical Interval Note:  03/25/2022 11:18 AM  Ryan Herrera  has presented today for surgery, with the diagnosis of nstemi.  The various methods of treatment have been discussed with the patient and family. After consideration of risks, benefits and other options for treatment, the patient has consented to  Procedure(s): LEFT HEART CATH AND CORS/GRAFTS ANGIOGRAPHY (N/A) as a surgical intervention.  The patient's history has been reviewed, patient examined, no change in status, stable for surgery.  I have reviewed the patient's chart and labs.  Questions were answered to the patient's satisfaction.     Larae Grooms

## 2022-03-25 NOTE — Care Management (Signed)
  Transition of Care [facility]) Screening Note   Patient Details  Name: Tore Carreker Date of Birth: 1946/05/20   Transition of Care [facility]) CM/SW Contact:    Bethena Roys, RN Phone Number: 03/25/2022, 4:38 PM    Transition of Care Department [facility]) has reviewed the patient and no TOC needs have been identified at this time. We will continue to monitor patient advancement through interdisciplinary progression rounds. If new patient transition needs arise, please place a TOC consult.

## 2022-03-25 NOTE — Consult Note (Signed)
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 Amedeo Plenty (ortho/hand) 03/11/22 Wound type: full thickness traumatic finger partial amputation  Pressure Injury POA: NA Measurement:see nursing flow-sheet Wound bed: see nursing flow sheet  Drainage (amount, consistency, odor) see nursing flow sheet Periwound: intact  Dressing procedure/placement/frequency: Verifiying wound care with Dr. Vanetta Shawl office and any required follow up.  Updated wound care orders    Re consult if needed, will not follow at this time. Thanks  Cristofher Livecchi [employer], RN,CWOCN, CNS, [place] [phone])

## 2022-03-25 NOTE — Care Management Important Message (Signed)
Important Message  Patient Details  Name: Ryan Herrera MRN: [id number] Date of Birth: 1946/05/09   Medicare Important Message Given:  Yes     Orbie Pyo 03/25/2022, 2:18 PM

## 2022-03-25 NOTE — Telephone Encounter (Signed)
Pt has been admitted to [facility]. Having Cardiac cath today.

## 2022-03-25 NOTE — Progress Notes (Signed)
Per patient and night shift RN, patient having chest pain throughout the night. On iv nitro at 63mg/min at the moment. Patient going for cath this afternoon. Paged PA Bhagat to make him aware.

## 2022-03-25 NOTE — Telephone Encounter (Signed)
Advise him to increase his Tresiba to 28 units SQ nightly.  His readings arent too far off.  Want to prevent him from dropping too low at night to prevent dumping.

## 2022-03-25 NOTE — Progress Notes (Addendum)
Progress Note  Patient Name: Ryan Herrera Date of Encounter: 03/25/2022  [facility] Cardiologist: Janina Mayo, MD   Subjective   Intermittent chest pain overnight. Had another episode about 30 minutes ago, improving with up titration of nitro drip. Update EKG pending.   Inpatient Medications    Scheduled Meds:  allopurinol  100 mg Oral QHS   aspirin EC  81 mg Oral Daily   empagliflozin  10 mg Oral q morning   ezetimibe  10 mg Oral Daily   insulin aspart  0-15 Units Subcutaneous TID WC   insulin glargine-yfgn  20 Units Subcutaneous Daily   levothyroxine  100 mcg Oral Q0600   pantoprazole  40 mg Oral Daily   sodium chloride flush  3 mL Intravenous Q12H   Continuous Infusions:  sodium chloride     sodium chloride 1 mL/kg/hr (03/25/22 0512)   heparin 1,150 Units/hr (03/25/22 0514)   nitroGLYCERIN 65 mcg/min (03/25/22 0754)   PRN Meds: sodium chloride, acetaminophen, nitroGLYCERIN, nitroGLYCERIN, ondansetron (ZOFRAN) IV, sodium chloride flush   Vital Signs    Vitals:   03/25/22 0220 03/25/22 0505 03/25/22 0743 03/25/22 0749  BP: 131/62 133/62 97/67 114/62  Pulse: 85 86 78   Resp: 18 18    Temp:  98.1 F (36.7 C)    TempSrc:  Oral    SpO2: 97% 96% 93%   Weight:      Height:        Intake/Output Summary (Last 24 hours) at 03/25/2022 0811 Last data filed at 03/25/2022 0746 Gross per 24 hour  Intake 280 ml  Output 400 ml  Net -120 ml      03/22/2022    3:00 PM 03/19/2022    1:27 PM 03/12/2022   11:38 AM  Last 3 Weights  Weight (lbs) 198 lb 198 lb 12.8 oz 200 lb 9.6 oz  Weight (kg) 89.812 kg 90.175 kg 90.992 kg      Telemetry    NSR - Personally Reviewed  ECG    Pending  Physical Exam   GEN: No acute distress.   Neck: No JVD Cardiac: RRR, no murmurs, rubs, or gallops.  Respiratory: Clear to auscultation bilaterally. GI: Soft, nontender, non-distended  MS: No edema; No deformity. Neuro:  Nonfocal  Psych: Normal affect   Labs     High Sensitivity Troponin:   Recent Labs  Lab 03/22/22 1517 03/22/22 1659 03/23/22 0456  TROPONINIHS 283* 421* 1,667*     Chemistry Recent Labs  Lab 03/22/22 1517 03/23/22 0110 03/24/22 0743 03/25/22 0322  NA 136 137 135 132*  K 4.2 3.6 3.6 3.8  CL 104 105 102 100  CO2 '24 24 24 22  '$ GLUCOSE 216* 78 131* 175*  BUN 25* 25* 23 25*  CREATININE 1.13 1.16 1.20 1.31*  CALCIUM 9.5 8.7* 9.0 8.9  PROT 7.3  --   --   --   ALBUMIN 3.9  --   --   --   AST 23  --   --   --   ALT 22  --   --   --   ALKPHOS 57  --   --   --   BILITOT 0.5  --   --   --   GFRNONAA >60 >60 >60 56*  ANIONGAP '8 8 9 10    '$ Lipids No results for input(s): "CHOL", "TRIG", "HDL", "LABVLDL", "LDLCALC", "CHOLHDL" in the last 168 hours.  Hematology Recent Labs  Lab 03/22/22 1517 03/24/22 3664 03/25/22 4034  WBC 6.8 7.9 8.3  RBC 4.82 4.23 4.01*  HGB 14.8 12.9* 12.4*  HCT 44.9 39.0 37.3*  MCV 93.2 92.2 93.0  MCH 30.7 30.5 30.9  MCHC 33.0 33.1 33.2  RDW 13.8 13.7 13.5  PLT 232 213 211   Thyroid  Recent Labs  Lab 03/24/22 0743  TSH 2.349  FREET4 1.14*    BNPNo results for input(s): "BNP", "PROBNP" in the last 168 hours.  DDimer No results for input(s): "DDIMER" in the last 168 hours.   Radiology    ECHOCARDIOGRAM COMPLETE  Result Date: 03/23/2022    ECHOCARDIOGRAM REPORT   Patient Name:   Ryan Herrera Date of Exam: 03/23/2022 Medical Rec #:  [id number]                 Height:       66.5 in Accession #:    [id number]                Weight:       198.0 lb Date of Birth:  May 09, 1946                 BSA:          2.002 m Patient Age:    36 years                  BP:           124/71 mmHg Patient Gender: M                         HR:           67 bpm. Exam Location:  Inpatient Procedure: 2D Echo, Cardiac Doppler and Color Doppler Indications:    NSTEMI  History:        Patient has no prior history of Echocardiogram examinations.  Sonographer:    Joette Catching RCS Referring Phys: [id number]  [place]  1. Left ventricular ejection fraction, by estimation, is 60 to 65%. The left ventricle has normal function. The left ventricle has no regional wall motion abnormalities. There is mild concentric left ventricular hypertrophy. Left ventricular diastolic parameters are consistent with Grade I diastolic dysfunction (impaired relaxation).  2. Right ventricular systolic function is normal. The right ventricular size is normal. There is normal pulmonary artery systolic pressure. The estimated right ventricular systolic pressure is 63.0 mmHg.  3. The mitral valve is grossly normal. No evidence of mitral valve regurgitation. No evidence of mitral stenosis. The mean mitral valve gradient is 2.0 mmHg. Moderate mitral annular calcification.  4. The aortic valve is tricuspid. There is moderate calcification of the aortic valve. There is moderate thickening of the aortic valve. Aortic valve regurgitation is not visualized. Aortic valve sclerosis/calcification is present, without any evidence of aortic stenosis. Aortic valve mean gradient measures 5.0 mmHg. Normal LV stroke volume.  5. Aortic dilatation noted. There is mild dilatation of the aortic root, measuring 40 mm. There is mild dilatation of the ascending aorta, measuring 41 mm. Repeat imaging recommended in one year if clinically indicated.  6. The inferior vena cava is normal in size with greater than 50% respiratory variability, suggesting right atrial pressure of 3 mmHg. Comparison(s): No prior Echocardiogram. FINDINGS  Left Ventricle: Calcified papillary muscle. Left ventricular ejection fraction, by estimation, is 60 to 65%. The left ventricle has normal function. The left ventricle has no regional wall motion abnormalities. The left ventricular internal cavity size was normal in size.  There is mild concentric left ventricular hypertrophy. Left ventricular diastolic parameters are consistent with Grade I diastolic dysfunction (impaired  relaxation). Right Ventricle: The right ventricular size is normal. No increase in right ventricular wall thickness. Right ventricular systolic function is normal. There is normal pulmonary artery systolic pressure. The tricuspid regurgitant velocity is 2.24 m/s, and  with an assumed right atrial pressure of 3 mmHg, the estimated right ventricular systolic pressure is 78.6 mmHg. Left Atrium: Left atrial size was normal in size. Right Atrium: Right atrial size was normal in size. Pericardium: There is no evidence of pericardial effusion. Presence of epicardial fat layer. Mitral Valve: MVA by 2d Planimetry. The mitral valve is grossly normal. Moderate mitral annular calcification. No evidence of mitral valve regurgitation. No evidence of mitral valve stenosis. The mean mitral valve gradient is 2.0 mmHg with average heart rate of 64 bpm. Tricuspid Valve: The tricuspid valve is normal in structure. Tricuspid valve regurgitation is trivial. No evidence of tricuspid stenosis. Aortic Valve: The aortic valve is tricuspid. There is moderate calcification of the aortic valve. There is moderate thickening of the aortic valve. There is moderate aortic valve annular calcification. Aortic valve regurgitation is not visualized. Aortic  valve sclerosis/calcification is present, without any evidence of aortic stenosis. Aortic valve mean gradient measures 5.0 mmHg. Aortic valve peak gradient measures 8.8 mmHg. Aortic valve area, by VTI measures 2.45 cm. Pulmonic Valve: The pulmonic valve was normal in structure. Pulmonic valve regurgitation is mild. No evidence of pulmonic stenosis. Aorta: Aortic dilatation noted. There is mild dilatation of the aortic root, measuring 40 mm. There is mild dilatation of the ascending aorta, measuring 41 mm. Venous: The inferior vena cava is normal in size with greater than 50% respiratory variability, suggesting right atrial pressure of 3 mmHg. IAS/Shunts: No atrial level shunt detected by color flow  Doppler.  LEFT VENTRICLE PLAX 2D LVIDd:         4.70 cm   Diastology LVIDs:         3.00 cm   LV e' medial:    7.29 cm/s LV PW:         1.10 cm   LV E/e' medial:  13.1 LV IVS:        1.20 cm   LV e' lateral:   10.10 cm/s LVOT diam:     2.00 cm   LV E/e' lateral: 9.5 LV SV:         73 LV SV Index:   36 LVOT Area:     3.14 cm  RIGHT VENTRICLE            IVC RV Basal diam:  3.60 cm    IVC diam: 1.60 cm RV Mid diam:    2.30 cm RV S prime:     9.90 cm/s TAPSE (M-mode): 1.6 cm LEFT ATRIUM             Index        RIGHT ATRIUM           Index LA diam:        3.70 cm 1.85 cm/m   RA Area:     11.40 cm LA Vol (A2C):   35.5 ml 17.73 ml/m  RA Volume:   22.40 ml  11.19 ml/m LA Vol (A4C):   48.6 ml 24.27 ml/m LA Biplane Vol: 42.1 ml 21.02 ml/m  AORTIC VALVE  PULMONIC VALVE AV Area (Vmax):    2.33 cm      PV Vmax:          0.96 m/s AV Area (Vmean):   2.21 cm      PV Peak grad:     3.7 mmHg AV Area (VTI):     2.45 cm      PR End Diast Vel: 4.93 msec AV Vmax:           148.00 cm/s AV Vmean:          112.000 cm/s AV VTI:            0.297 m AV Peak Grad:      8.8 mmHg AV Mean Grad:      5.0 mmHg LVOT Vmax:         110.00 cm/s LVOT Vmean:        78.800 cm/s LVOT VTI:          0.232 m LVOT/AV VTI ratio: 0.78  AORTA Ao Root diam: 4.00 cm Ao Asc diam:  4.00 cm MITRAL VALVE                TRICUSPID VALVE MV Area (PHT): 3.01 cm     TR Peak grad:   20.1 mmHg MV Mean grad:  2.0 mmHg     TR Vmax:        224.00 cm/s MV Decel Time: 252 msec MV E velocity: 95.50 cm/s   SHUNTS MV A velocity: 105.00 cm/s  Systemic VTI:  0.23 m MV E/A ratio:  0.91         Systemic Diam: 2.00 cm Rudean Haskell MD Electronically signed by Rudean Haskell MD Signature Date/Time: 03/23/2022/1:17:02 PM    Final     Cardiac Studies   2d echo 03/23/22  1. Left ventricular ejection fraction, by estimation, is 60 to 65%. The  left ventricle has normal function. The left ventricle has no regional  wall motion abnormalities. There  is mild concentric left ventricular  hypertrophy. Left ventricular diastolic  parameters are consistent with Grade I diastolic dysfunction (impaired  relaxation).   2. Right ventricular systolic function is normal. The right ventricular  size is normal. There is normal pulmonary artery systolic pressure. The  estimated right ventricular systolic pressure is 38.4 mmHg.   3. The mitral valve is grossly normal. No evidence of mitral valve  regurgitation. No evidence of mitral stenosis. The mean mitral valve  gradient is 2.0 mmHg. Moderate mitral annular calcification.   4. The aortic valve is tricuspid. There is moderate calcification of the  aortic valve. There is moderate thickening of the aortic valve. Aortic  valve regurgitation is not visualized. Aortic valve  sclerosis/calcification is present, without any evidence  of aortic stenosis. Aortic valve mean gradient measures 5.0 mmHg. Normal  LV stroke volume.   5. Aortic dilatation noted. There is mild dilatation of the aortic root,  measuring 40 mm. There is mild dilatation of the ascending aorta,  measuring 41 mm. Repeat imaging recommended in one year if clinically  indicated.   6. The inferior vena cava is normal in size with greater than 50%  respiratory variability, suggesting right atrial pressure of 3 mmHg.   Comparison(s): No prior Echocardiogram.    Patient Profile     76 y.o. male with hx of CAD s/p CABG (09/2007 at [facility], normal NST in 12/2012) hypertension, hyperlipidemia, diabetes, and prior tobacco abuse admitted with NSTEMI.  Transferred from [facility] to [facility]  for cath.  Assessment & Plan    1. CAD s/p prior CABG, here with NSTEMI - Intermittent  chest pain overnight, currently improving with up titration of nitro drip.  - BB stopped yesterday 2nd to Mobitz 1 - for cath later today  - Continue ASA, heparin, Zetia (on Repatha as OP)  2. Essential HTN - stop metoprolol as above - adjusted med orders to  hold HCTZ/Losartan in prep for cath given slight bump in Scr - BP stable    3. Hyperlipidemia - last LDL 63 on 03/12/22, continue Zetia, Repatha as OP (statin intolerant)   4. Hypothyroidism - last TSH 03/12/22 was 5.760, started on levothyroxine 126mg daily by primary care with plan for follow-up visit in 6 weeks - Lab with Normal TSH at 2.3, TSH 1.14 >> follow up with PCP post discharge    5. Diabetes mellitus - SSI   6. Finger injury - patient reports he was instructed to keep dressing on until skin begins to heal back over - continue OP f/u with hand surgery  7. Mobitz 1  - No reoccurance with holding BB  8. AKI - Hydrate. Hold HCTZ and losartan    For questions or updates, please contact [place]Please consult [email or web address] for contact info under        Signed, BLeanor Kail PA  03/25/2022, 8:11 AM     I have personally seen and examined this patient. I agree with the assessment and plan as outlined above.  Pt admitted with a NSTEMI. History of CAD with prior CABG. I agree that a cardiac cath is indicated. Continue IV NTG and IV heparin while awaiting cardiac cath. Labs reviewed and ok for cath today. All questions answered.   CLauree Chandler6/19/2023 10:36 AM

## 2022-03-26 ENCOUNTER — Inpatient Hospital Stay (HOSPITAL_COMMUNITY): Payer: Medicare HMO

## 2022-03-26 DIAGNOSIS — I25110 Atherosclerotic heart disease of native coronary artery with unstable angina pectoris: Secondary | ICD-10-CM | POA: Diagnosis not present

## 2022-03-26 DIAGNOSIS — I10 Essential (primary) hypertension: Secondary | ICD-10-CM | POA: Diagnosis not present

## 2022-03-26 DIAGNOSIS — Z01810 Encounter for preprocedural cardiovascular examination: Secondary | ICD-10-CM

## 2022-03-26 DIAGNOSIS — I214 Non-ST elevation (NSTEMI) myocardial infarction: Secondary | ICD-10-CM | POA: Diagnosis not present

## 2022-03-26 DIAGNOSIS — E7800 Pure hypercholesterolemia, unspecified: Secondary | ICD-10-CM | POA: Diagnosis not present

## 2022-03-26 LAB — GLUCOSE, CAPILLARY
Glucose-Capillary: 140 mg/dL — ABNORMAL HIGH (ref 70–99)
Glucose-Capillary: 215 mg/dL — ABNORMAL HIGH (ref 70–99)
Glucose-Capillary: 195 mg/dL — ABNORMAL HIGH (ref 70–99)
Glucose-Capillary: 259 mg/dL — ABNORMAL HIGH (ref 70–99)

## 2022-03-26 LAB — CBC
HCT: 35.8 % — ABNORMAL LOW (ref 39.0–52.0)
Hemoglobin: 12.1 g/dL — ABNORMAL LOW (ref 13.0–17.0)
MCH: 31.6 pg (ref 26.0–34.0)
MCHC: 33.8 g/dL (ref 30.0–36.0)
MCV: 93.5 fL (ref 80.0–100.0)
Platelets: 196 10*3/uL (ref 150–400)
RBC: 3.83 MIL/uL — ABNORMAL LOW (ref 4.22–5.81)
RDW: 13.7 % (ref 11.5–15.5)
WBC: 6.7 10*3/uL (ref 4.0–10.5)
nRBC: 0 % (ref 0.0–0.2)

## 2022-03-26 LAB — BASIC METABOLIC PANEL
Anion gap: 9 (ref 5–15)
BUN: 23 mg/dL (ref 8–23)
CO2: 22 mmol/L (ref 22–32)
Calcium: 8.5 mg/dL — ABNORMAL LOW (ref 8.9–10.3)
Chloride: 104 mmol/L (ref 98–111)
Creatinine, Ser: 1.13 mg/dL (ref 0.61–1.24)
GFR, Estimated: >60 mL/min (ref >60)
Glucose, Bld: 145 mg/dL — ABNORMAL HIGH (ref 70–99)
Potassium: 3.7 mmol/L (ref 3.5–5.1)
Sodium: 135 mmol/L (ref 135–145)

## 2022-03-26 LAB — PULMONARY FUNCTION TEST
FEF 25-75 Pre: 1.38 L/sec
FEF2575-%Pred-Pre: 82 %
FEV1-%Pred-Pre: 57 %
FEV1-Pre: 1.37 L
FEV1FVC-%Pred-Pre: 112 %
FEV6-%Pred-Pre: 53 %
FEV6-Pre: 1.67 L
FEV6FVC-%Pred-Pre: 106 %
FVC-%Pred-Pre: 50 %
FVC-Pre: 1.7 L
Pre FEV1/FVC ratio: 81 %
Pre FEV6/FVC Ratio: 98 %

## 2022-03-26 LAB — T3: T3, Total: 74 ng/dL (ref 71–180)

## 2022-03-26 LAB — URINALYSIS, ROUTINE W REFLEX MICROSCOPIC
Bacteria, UA: NONE SEEN
Bilirubin Urine: NEGATIVE
Glucose, UA: >=500 mg/dL — AB
Hgb urine dipstick: NEGATIVE
Ketones, ur: 20 mg/dL — AB
Nitrite: NEGATIVE
Protein, ur: NEGATIVE mg/dL
Specific Gravity, Urine: 1.033 — ABNORMAL HIGH (ref 1.005–1.030)
pH: 5 (ref 5.0–8.0)

## 2022-03-26 LAB — HEPARIN LEVEL (UNFRACTIONATED): Heparin Unfractionated: 0.17 IU/mL — ABNORMAL LOW (ref 0.30–0.70)

## 2022-03-26 LAB — URIC ACID: Uric Acid, Serum: 5.6 mg/dL (ref 3.7–8.6)

## 2022-03-26 MED ORDER — LOSARTAN POTASSIUM 25 MG PO TABS
25.0000 mg | ORAL_TABLET | Freq: Every day | ORAL | Status: DC
  Administered 2022-03-26 – 2022-03-27 (×2): 25 mg via ORAL
  Filled 2022-03-26 (×2): qty 1

## 2022-03-26 MED ORDER — HEPARIN (PORCINE) 25000 UT/250ML-% IV SOLN
1550.0000 [IU]/h | INTRAVENOUS | Status: DC
  Administered 2022-03-26: 1350 [IU]/h via INTRAVENOUS
  Administered 2022-03-27 (×2): 1550 [IU]/h via INTRAVENOUS
  Filled 2022-03-26 (×3): qty 250

## 2022-03-26 MED ORDER — MAGNESIUM HYDROXIDE 400 MG/5ML PO SUSP
30.0000 mL | Freq: Every day | ORAL | Status: DC | PRN
  Administered 2022-03-26: 30 mL via ORAL
  Filled 2022-03-26: qty 30

## 2022-03-26 NOTE — Progress Notes (Addendum)
Progress Note  Patient Name: Ryan Herrera Date of Encounter: 03/26/2022  [facility] Cardiologist: Janina Mayo, MD   Subjective   No chest pain this morning. Is tender to palpation in the RLQ/hip area. No n/v/d.   Inpatient Medications    Scheduled Meds:  allopurinol  100 mg Oral QHS   aspirin EC  81 mg Oral Daily   bacitracin   Topical BID   empagliflozin  10 mg Oral q morning   ezetimibe  10 mg Oral Daily   insulin aspart  0-15 Units Subcutaneous TID WC   insulin glargine-yfgn  20 Units Subcutaneous Daily   levothyroxine  100 mcg Oral Q0600   pantoprazole  40 mg Oral Daily   sodium chloride flush  3 mL Intravenous Q12H   sodium chloride flush  3 mL Intravenous Q12H   Continuous Infusions:  sodium chloride     nitroGLYCERIN 65 mcg/min (03/25/22 0817)   PRN Meds: sodium chloride, acetaminophen, magnesium hydroxide, morphine injection, nitroGLYCERIN, nitroGLYCERIN, ondansetron (ZOFRAN) IV, sodium chloride flush   Vital Signs    Vitals:   03/25/22 1228 03/25/22 1950 03/26/22 0537 03/26/22 0736  BP:  131/69 126/71   Pulse:  81 68   Resp:  (!) 21 19   Temp: 98 F (36.7 C) 98.1 F (36.7 C) 98.1 F (36.7 C) 98 F (36.7 C)  TempSrc: Oral Oral Oral Axillary  SpO2:  96% 97%   Weight:      Height:        Intake/Output Summary (Last 24 hours) at 03/26/2022 0818 Last data filed at 03/26/2022 0522 Gross per 24 hour  Intake --  Output 750 ml  Net -750 ml      03/22/2022    3:00 PM 03/19/2022    1:27 PM 03/12/2022   11:38 AM  Last 3 Weights  Weight (lbs) 198 lb 198 lb 12.8 oz 200 lb 9.6 oz  Weight (kg) 89.812 kg 90.175 kg 90.992 kg      Telemetry    Sinus Rhythm - Personally Reviewed  ECG    No new tracing  Physical Exam   GEN: No acute distress.   Neck: No JVD Cardiac: RRR, no murmurs, rubs, or gallops.  Respiratory: Clear to auscultation bilaterally. GI: Soft, tender to palpation to the RLQ, non-distended. Left radial cath site  stable.  MS: No edema; No deformity. Neuro:  Nonfocal  Psych: Normal affect   Labs    High Sensitivity Troponin:   Recent Labs  Lab 03/22/22 1517 03/22/22 1659 03/23/22 0456  TROPONINIHS 283* 421* 1,667*     Chemistry Recent Labs  Lab 03/22/22 1517 03/23/22 0110 03/24/22 0743 03/25/22 0322 03/26/22 0310  NA 136   < > 135 132* 135  K 4.2   < > 3.6 3.8 3.7  CL 104   < > 102 100 104  CO2 24   < > '24 22 22  '$ GLUCOSE 216*   < > 131* 175* 145*  BUN 25*   < > 23 25* 23  CREATININE 1.13   < > 1.20 1.31* 1.13  CALCIUM 9.5   < > 9.0 8.9 8.5*  PROT 7.3  --   --   --   --   ALBUMIN 3.9  --   --   --   --   AST 23  --   --   --   --   ALT 22  --   --   --   --  ALKPHOS 57  --   --   --   --   BILITOT 0.5  --   --   --   --   GFRNONAA >60   < > >60 56* >60  ANIONGAP 8   < > '9 10 9   '$ < > = values in this interval not displayed.    Lipids No results for input(s): "CHOL", "TRIG", "HDL", "LABVLDL", "LDLCALC", "CHOLHDL" in the last 168 hours.  Hematology Recent Labs  Lab 03/24/22 0743 03/25/22 0322 03/26/22 0310  WBC 7.9 8.3 6.7  RBC 4.23 4.01* 3.83*  HGB 12.9* 12.4* 12.1*  HCT 39.0 37.3* 35.8*  MCV 92.2 93.0 93.5  MCH 30.5 30.9 31.6  MCHC 33.1 33.2 33.8  RDW 13.7 13.5 13.7  PLT 213 211 196   Thyroid  Recent Labs  Lab 03/24/22 0743  TSH 2.349  FREET4 1.14*    BNPNo results for input(s): "BNP", "PROBNP" in the last 168 hours.  DDimer No results for input(s): "DDIMER" in the last 168 hours.   Radiology    CARDIAC CATHETERIZATION  Result Date: 03/25/2022   Ramus lesion is 80% stenosed.   Lat Ramus lesion is 80% stenosed.   Mid Cx lesion is 75% stenosed.  SVG to OM is occulded.   LPAV lesion is 90% stenosed.   Mid LAD lesion is 100% stenosed. LIMA to LAD is patent.   Origin to Prox Graft lesion is 100% stenosed.   Origin to Prox Graft lesion is 100% stenosed.   Prox RCA lesion is 90% stenosed with 90% stenosed side branch in 1st RPL.  SVG to PDA is occluded.   The  left ventricular systolic function is normal.   LV end diastolic pressure is normal.   The left ventricular ejection fraction is 55-65% by visual estimate.   There is no aortic valve stenosis. Severe disease of the circumflex and branches and well as RCA and branches.  RCA has a sharp angulation and has several branches that arise proximally that would make PCI difficult.  Severe calcific disease in both branches of a ramus vessel and in the left PDA.  SVG to OM and SVG to PDA are occluded.  Consult surgery for possible redo CABG. Prior surgery was done at [facility] in 2008.    Cardiac Studies   Cath: 03/25/22    Ramus lesion is 80% stenosed.   Lat Ramus lesion is 80% stenosed.   Mid Cx lesion is 75% stenosed.  SVG to OM is occulded.   LPAV lesion is 90% stenosed.   Mid LAD lesion is 100% stenosed. LIMA to LAD is patent.   Origin to Prox Graft lesion is 100% stenosed.   Origin to Prox Graft lesion is 100% stenosed.   Prox RCA lesion is 90% stenosed with 90% stenosed side branch in 1st RPL.  SVG to PDA is occluded.   The left ventricular systolic function is normal.   LV end diastolic pressure is normal.   The left ventricular ejection fraction is 55-65% by visual estimate.   There is no aortic valve stenosis.   Severe disease of the circumflex and branches and well as RCA and branches.  RCA has a sharp angulation and has several branches that arise proximally that would make PCI difficult.  Severe calcific disease in both branches of a ramus vessel and in the left PDA.     SVG to OM and SVG to PDA are occluded.     Consult surgery for possible redo  CABG. Prior surgery was done at [facility] in 2008.   Diagnostic Dominance: Co-dominant  Echo: 03/23/22  IMPRESSIONS     1. Left ventricular ejection fraction, by estimation, is 60 to 65%. The  left ventricle has normal function. The left ventricle has no regional  wall motion abnormalities. There is mild concentric left ventricular  hypertrophy.  Left ventricular diastolic  parameters are consistent with Grade I diastolic dysfunction (impaired  relaxation).   2. Right ventricular systolic function is normal. The right ventricular  size is normal. There is normal pulmonary artery systolic pressure. The  estimated right ventricular systolic pressure is 87.5 mmHg.   3. The mitral valve is grossly normal. No evidence of mitral valve  regurgitation. No evidence of mitral stenosis. The mean mitral valve  gradient is 2.0 mmHg. Moderate mitral annular calcification.   4. The aortic valve is tricuspid. There is moderate calcification of the  aortic valve. There is moderate thickening of the aortic valve. Aortic  valve regurgitation is not visualized. Aortic valve  sclerosis/calcification is present, without any evidence  of aortic stenosis. Aortic valve mean gradient measures 5.0 mmHg. Normal  LV stroke volume.   5. Aortic dilatation noted. There is mild dilatation of the aortic root,  measuring 40 mm. There is mild dilatation of the ascending aorta,  measuring 41 mm. Repeat imaging recommended in one year if clinically  indicated.   6. The inferior vena cava is normal in size with greater than 50%  respiratory variability, suggesting right atrial pressure of 3 mmHg.   Comparison(s): No prior Echocardiogram.   FINDINGS   Left Ventricle: Calcified papillary muscle. Left ventricular ejection  fraction, by estimation, is 60 to 65%. The left ventricle has normal  function. The left ventricle has no regional wall motion abnormalities.  The left ventricular internal cavity size  was normal in size. There is mild concentric left ventricular hypertrophy.  Left ventricular diastolic parameters are consistent with Grade I  diastolic dysfunction (impaired relaxation).   Right Ventricle: The right ventricular size is normal. No increase in  right ventricular wall thickness. Right ventricular systolic function is  normal. There is normal  pulmonary artery systolic pressure. The tricuspid  regurgitant velocity is 2.24 m/s, and   with an assumed right atrial pressure of 3 mmHg, the estimated right  ventricular systolic pressure is 64.3 mmHg.   Left Atrium: Left atrial size was normal in size.   Right Atrium: Right atrial size was normal in size.   Pericardium: There is no evidence of pericardial effusion. Presence of  epicardial fat layer.   Mitral Valve: MVA by 2d Planimetry. The mitral valve is grossly normal.  Moderate mitral annular calcification. No evidence of mitral valve  regurgitation. No evidence of mitral valve stenosis. The mean mitral valve  gradient is 2.0 mmHg with average heart  rate of 64 bpm.   Tricuspid Valve: The tricuspid valve is normal in structure. Tricuspid  valve regurgitation is trivial. No evidence of tricuspid stenosis.   Aortic Valve: The aortic valve is tricuspid. There is moderate  calcification of the aortic valve. There is moderate thickening of the  aortic valve. There is moderate aortic valve annular calcification. Aortic  valve regurgitation is not visualized. Aortic   valve sclerosis/calcification is present, without any evidence of aortic  stenosis. Aortic valve mean gradient measures 5.0 mmHg. Aortic valve peak  gradient measures 8.8 mmHg. Aortic valve area, by VTI measures 2.45 cm.   Pulmonic Valve: The pulmonic valve was normal  in structure. Pulmonic valve  regurgitation is mild. No evidence of pulmonic stenosis.   Aorta: Aortic dilatation noted. There is mild dilatation of the aortic  root, measuring 40 mm. There is mild dilatation of the ascending aorta,  measuring 41 mm.   Venous: The inferior vena cava is normal in size with greater than 50%  respiratory variability, suggesting right atrial pressure of 3 mmHg.   IAS/Shunts: No atrial level shunt detected by color flow Doppler.   Patient Profile     76 y.o. male  with hx of CAD s/p CABG (09/2007 at [facility], normal NST  in 12/2012) hypertension, hyperlipidemia, diabetes, and prior tobacco abuse admitted with NSTEMI.  Transferred from [facility] to [facility] for cath.  Assessment & Plan    CAD s/p prior CABG, here with NSTEMI: Underwent cardiac cath noted above with severe disease in LCx/branches as well as RCA/branches with occluded SVG-OM and SVG-PDA, patent LIMA-LAD. TCTS consulted for redo CABG. Seen by Dr. Roxan Hockey with tentatively surgery on Thursday (pending OR scheduling) -- Continue ASA, heparin (resumed), Zetia (on Repatha as OP)   Essential HTN: stable -- metoprolol was stopped, along with HCTZ/losartan held for cath -- will resume losartan '25mg'$  daily    Hyperlipidemia: LDL 63 on 03/12/22 -- continue Zetia, Repatha as OP (statin intolerant)   Hypothyroidism: last TSH 03/12/22 was 5.760, started on levothyroxine 153mg daily by primary care with plan for follow-up visit in 6 weeks -- Lab with Normal TSH at 2.3, TSH 1.14 >> follow up with PCP post discharge    Diabetes mellitus: Hgb A1c 7.6 -- SSI -- continue jardiance   Finger injury: -- patient reports he was instructed to keep dressing on until skin begins to heal back over. Seen by ortho with recs noted  -- continue OP f/u with hand surgery   Mobitz 1:  -- No reoccurance with holding BB   AKI: resolved   RLQ/hip/flank pain: started yesterday afternoon. No fevers, WBC WNL -- ordered for UA and KUB  For questions or updates, please contact CRanchesterPlease consult [email or web address] for contact info under        Signed, LReino Bellis NP  03/26/2022, 8:18 AM     I have personally seen and examined this patient. I agree with the assessment and plan as outlined above.  Cardiac cath with failure of all vein grafts with patency of the LIMA graft CT surgery is planning redo CABG He has no chest pain.  Vital stable.  He does have right flank pain. KUB pending. U/A pending No other changes today   CLauree Chandler6/20/2023 10:47 AM

## 2022-03-26 NOTE — Progress Notes (Signed)
Pre-CABG exams have been completed.  Results can be found under chart review under CV PROC. 03/26/2022 12:07 PM Reta Norgren RVT, RDMS

## 2022-03-26 NOTE — Progress Notes (Signed)
[place] for heparin Indication: chest pain/ACS  Allergies  Allergen Reactions   Myrbetriq [Mirabegron] Other (See Comments)    Unknown reaction   Niacin And Related Itching   Other Diarrhea and Nausea And Vomiting    PT does not want OPIOIDS   GLP-1 drugs - Nausea, vomiting, diarrhea, belching   Statins Itching    Patient Measurements: Height: 5' 6.5" (168.9 cm) Weight: 89.8 kg (198 lb) IBW/kg (Calculated) : 64.95 Heparin Dosing Weight: 89kg  Vital Signs: Temp: 98 F (36.7 C) (06/20 0736) Temp Source: Axillary (06/20 0736) BP: 126/71 (06/20 0537) Pulse Rate: 68 (06/20 0537)  Labs: Recent Labs    03/24/22 0743 03/25/22 0322 03/26/22 0310  HGB 12.9* 12.4* 12.1*  HCT 39.0 37.3* 35.8*  PLT 213 211 196  HEPARINUNFRC 0.36 0.12*  --   CREATININE 1.20 1.31* 1.13    Estimated Creatinine Clearance: 57.1 mL/min (by C-G formula based on SCr of 1.13 mg/dL).   Medical History: Past Medical History:  Diagnosis Date   Arthritis    hands   Coronary artery disease    Diabetes mellitus without complication ([name])    GERD (gastroesophageal reflux disease)    H/O poliomyelitis    age 76.  now has "weak stomach muscles"   Hypertension    IBS (irritable bowel syndrome)    Iron deficiency anemia 09/22/2018   Renal insufficiency    Thyroid disease      Assessment: 15 yoM with hx CABG admitted with CP and elevated troponins. Pt is not on AC PTA. Heparin started for ACS rule out. LHC 6/19 with mvCAD not amenable to PCI, CVTS consulted for CABG. Heparin to resume while awaiting plan.  Goal of Therapy:  Heparin level 0.3-0.7 units/ml Monitor platelets by anticoagulation protocol: Yes   Plan:  Resume heparin 1350 units/h no bolus Check 8h heparin level  Arrie Senate, PharmD, [place], [facility] Clinical Pharmacist [phone] Please check AMION for all [facility] numbers 03/26/2022

## 2022-03-26 NOTE — Progress Notes (Signed)
[place] for heparin Indication: chest pain/ACS  Allergies  Allergen Reactions   Myrbetriq [Mirabegron] Other (See Comments)    Unknown reaction   Niacin And Related Itching   Other Diarrhea and Nausea And Vomiting    PT does not want OPIOIDS   GLP-1 drugs - Nausea, vomiting, diarrhea, belching   Statins Itching    Patient Measurements: Height: 5' 6.5" (168.9 cm) Weight: 89.8 kg (198 lb) IBW/kg (Calculated) : 64.95 Heparin Dosing Weight: 89kg  Vital Signs: Temp: 97.6 F (36.4 C) (06/20 1551) Temp Source: Oral (06/20 1551)  Labs: Recent Labs    03/24/22 0743 03/25/22 0322 03/26/22 0310 03/26/22 1645  HGB 12.9* 12.4* 12.1*  --   HCT 39.0 37.3* 35.8*  --   PLT 213 211 196  --   HEPARINUNFRC 0.36 0.12*  --  0.17*  CREATININE 1.20 1.31* 1.13  --      Estimated Creatinine Clearance: 57.1 mL/min (by C-G formula based on SCr of 1.13 mg/dL).   Assessment: 48 yoM with hx CABG admitted with CP and elevated troponins. Pt is not on AC PTA. Heparin started for ACS rule out. LHC 6/19 with mvCAD not amenable to PCI, CVTS consulted for CABG. Heparin to resume while awaiting plan.  Heparin level subtherapeutic (0.17) on infusion at 1350 units/hr. No issues with line or bleeding reported per RN.  Goal of Therapy:  Heparin level 0.3-0.7 units/ml Monitor platelets by anticoagulation protocol: Yes   Plan:  Increase heparin to 1550 units/h no bolus Check 8h heparin level  Sherlon Handing, PharmD, BCPS Please see amion for complete clinical pharmacist phone list 03/26/2022

## 2022-03-26 NOTE — Progress Notes (Signed)
1 Day Post-Op Procedure(s) (LRB): LEFT HEART CATH AND CORS/GRAFTS ANGIOGRAPHY (N/A) Subjective: "Not good." C/o right flank pain, dark urine and bilateral heel/ achilles pain  Objective: Vital signs in last 24 hours: Temp:  [98 F (36.7 C)-98.1 F (36.7 C)] 98 F (36.7 C) (06/20 0736) Pulse Rate:  [68-81] 68 (06/20 0537) Cardiac Rhythm: Normal sinus rhythm;Heart block (06/20 0811) Resp:  [19-21] 19 (06/20 0537) BP: (109-131)/(61-71) 126/71 (06/20 0537) SpO2:  [96 %-97 %] 97 % (06/20 0537)  Hemodynamic parameters for last 24 hours:    Intake/Output from previous day: 06/19 0701 - 06/20 0700 In: -  Out: 950 [Urine:950] Intake/Output this shift: Total I/O In: -  Out: 250 [Urine:250]  General appearance: alert, cooperative, and mild distress Neurologic: intact Abdomen: mild tenderness to palpation right flank Extremities: well perfused, pulses intact, no erythema  Lab Results: Recent Labs    03/25/22 0322 03/26/22 0310  WBC 8.3 6.7  HGB 12.4* 12.1*  HCT 37.3* 35.8*  PLT 211 196   BMET:  Recent Labs    03/25/22 0322 03/26/22 0310  NA 132* 135  K 3.8 3.7  CL 100 104  CO2 22 22  GLUCOSE 175* 145*  BUN 25* 23  CREATININE 1.31* 1.13  CALCIUM 8.9 8.5*    PT/INR: No results for input(s): "LABPROT", "INR" in the last 72 hours. ABG No results found for: "PHART", "HCO3", "TCO2", "ACIDBASEDEF", "O2SAT" CBG (last 3)  Recent Labs    03/25/22 1642 03/25/22 2204 03/26/22 0809  GLUCAP 147* 209* 140*    Assessment/Plan: S/P Procedure(s) (LRB): LEFT HEART CATH AND CORS/GRAFTS ANGIOGRAPHY (N/A) - C/o right flank pain- h/o kidney stones- "not that bad" Will check UA and KUB WBC normal Heel/ Achilles tendon pain- unclear, "what I think gout would feel like" but no joint pain at ankle, no erythema or swelling   LOS: 4 days    Melrose Nakayama 03/26/2022

## 2022-03-27 ENCOUNTER — Encounter (HOSPITAL_COMMUNITY): Payer: Self-pay | Admitting: Internal Medicine

## 2022-03-27 DIAGNOSIS — I25110 Atherosclerotic heart disease of native coronary artery with unstable angina pectoris: Secondary | ICD-10-CM | POA: Diagnosis not present

## 2022-03-27 DIAGNOSIS — I214 Non-ST elevation (NSTEMI) myocardial infarction: Secondary | ICD-10-CM | POA: Diagnosis not present

## 2022-03-27 LAB — BASIC METABOLIC PANEL
Anion gap: 7 (ref 5–15)
BUN: 22 mg/dL (ref 8–23)
CO2: 23 mmol/L (ref 22–32)
Calcium: 8.4 mg/dL — ABNORMAL LOW (ref 8.9–10.3)
Chloride: 104 mmol/L (ref 98–111)
Creatinine, Ser: 1.2 mg/dL (ref 0.61–1.24)
GFR, Estimated: >60 mL/min (ref >60)
Glucose, Bld: 192 mg/dL — ABNORMAL HIGH (ref 70–99)
Potassium: 3.9 mmol/L (ref 3.5–5.1)
Sodium: 134 mmol/L — ABNORMAL LOW (ref 135–145)

## 2022-03-27 LAB — GLUCOSE, CAPILLARY
Glucose-Capillary: 158 mg/dL — ABNORMAL HIGH (ref 70–99)
Glucose-Capillary: 173 mg/dL — ABNORMAL HIGH (ref 70–99)
Glucose-Capillary: 141 mg/dL — ABNORMAL HIGH (ref 70–99)
Glucose-Capillary: 220 mg/dL — ABNORMAL HIGH (ref 70–99)

## 2022-03-27 LAB — CBC
HCT: 34 % — ABNORMAL LOW (ref 39.0–52.0)
Hemoglobin: 11.4 g/dL — ABNORMAL LOW (ref 13.0–17.0)
MCH: 31.1 pg (ref 26.0–34.0)
MCHC: 33.5 g/dL (ref 30.0–36.0)
MCV: 92.6 fL (ref 80.0–100.0)
Platelets: 204 10*3/uL (ref 150–400)
RBC: 3.67 MIL/uL — ABNORMAL LOW (ref 4.22–5.81)
RDW: 13.5 % (ref 11.5–15.5)
WBC: 6.6 10*3/uL (ref 4.0–10.5)
nRBC: 0 % (ref 0.0–0.2)

## 2022-03-27 LAB — BLOOD GAS, ARTERIAL
Acid-Base Excess: 4.2 mmol/L — ABNORMAL HIGH (ref 0.0–2.0)
Bicarbonate: 28.4 mmol/L — ABNORMAL HIGH (ref 20.0–28.0)
O2 Saturation: 98.4 %
Patient temperature: 36.6
pCO2 arterial: 39 mmHg (ref 32–48)
pH, Arterial: 7.47 — ABNORMAL HIGH (ref 7.35–7.45)
pO2, Arterial: 83 mmHg (ref 83–108)

## 2022-03-27 LAB — ABO/RH: ABO/RH(D): A POS

## 2022-03-27 LAB — SARS CORONAVIRUS 2 BY RT PCR: SARS Coronavirus 2 by RT PCR: NEGATIVE

## 2022-03-27 LAB — HEPARIN LEVEL (UNFRACTIONATED)
Heparin Unfractionated: 0.3 IU/mL (ref 0.30–0.70)
Heparin Unfractionated: 0.45 IU/mL (ref 0.30–0.70)

## 2022-03-27 LAB — SURGICAL PCR SCREEN
MRSA, PCR: NEGATIVE
Staphylococcus aureus: NEGATIVE

## 2022-03-27 MED ORDER — CHLORHEXIDINE GLUCONATE 0.12 % MT SOLN
15.0000 mL | Freq: Once | OROMUCOSAL | Status: AC
Start: 2022-03-28 — End: 2022-03-28
  Administered 2022-03-28: 15 mL via OROMUCOSAL
  Filled 2022-03-27: qty 15

## 2022-03-27 MED ORDER — POTASSIUM CHLORIDE 2 MEQ/ML IV SOLN
80.0000 meq | INTRAVENOUS | Status: DC
  Filled 2022-03-27: qty 40

## 2022-03-27 MED ORDER — VANCOMYCIN HCL 1500 MG/300ML IV SOLN
1500.0000 mg | INTRAVENOUS | Status: AC
  Administered 2022-03-28: 1500 mg via INTRAVENOUS
  Filled 2022-03-27: qty 300

## 2022-03-27 MED ORDER — INSULIN REGULAR(HUMAN) IN NACL 100-0.9 UT/100ML-% IV SOLN
INTRAVENOUS | Status: AC
  Administered 2022-03-28: 3.4 [IU]/h via INTRAVENOUS
  Filled 2022-03-27: qty 100

## 2022-03-27 MED ORDER — DEXMEDETOMIDINE HCL IN NACL 400 MCG/100ML IV SOLN
0.1000 ug/kg/h | INTRAVENOUS | Status: AC
  Administered 2022-03-28: .5 ug/kg/h via INTRAVENOUS
  Filled 2022-03-27: qty 100

## 2022-03-27 MED ORDER — TRANEXAMIC ACID (OHS) BOLUS VIA INFUSION
15.0000 mg/kg | INTRAVENOUS | Status: AC
  Administered 2022-03-28: 1347 mg via INTRAVENOUS
  Filled 2022-03-27: qty 1347

## 2022-03-27 MED ORDER — MILRINONE LACTATE IN DEXTROSE 20-5 MG/100ML-% IV SOLN
0.3000 ug/kg/min | INTRAVENOUS | Status: DC
  Filled 2022-03-27: qty 100

## 2022-03-27 MED ORDER — DIAZEPAM 2 MG PO TABS
2.0000 mg | ORAL_TABLET | Freq: Once | ORAL | Status: AC
Start: 2022-03-28 — End: 2022-03-28
  Administered 2022-03-28: 2 mg via ORAL
  Filled 2022-03-27: qty 1

## 2022-03-27 MED ORDER — METOPROLOL TARTRATE 12.5 MG HALF TABLET
12.5000 mg | ORAL_TABLET | Freq: Once | ORAL | Status: AC
  Administered 2022-03-28: 12.5 mg via ORAL
  Filled 2022-03-27: qty 1

## 2022-03-27 MED ORDER — TRANEXAMIC ACID (OHS) PUMP PRIME SOLUTION
2.0000 mg/kg | INTRAVENOUS | Status: DC
  Filled 2022-03-27: qty 1.8

## 2022-03-27 MED ORDER — MAGNESIUM SULFATE 50 % IJ SOLN
40.0000 meq | INTRAMUSCULAR | Status: DC
  Filled 2022-03-27: qty 9.85

## 2022-03-27 MED ORDER — HEPARIN 30,000 UNITS/1000 ML (OHS) CELLSAVER SOLUTION
Status: DC
  Filled 2022-03-27: qty 1000

## 2022-03-27 MED ORDER — CEFAZOLIN SODIUM-DEXTROSE 2-4 GM/100ML-% IV SOLN
2.0000 g | INTRAVENOUS | Status: DC
  Filled 2022-03-27: qty 100

## 2022-03-27 MED ORDER — BISACODYL 5 MG PO TBEC
5.0000 mg | DELAYED_RELEASE_TABLET | Freq: Once | ORAL | Status: AC
  Administered 2022-03-27: 5 mg via ORAL
  Filled 2022-03-27: qty 1

## 2022-03-27 MED ORDER — TRANEXAMIC ACID 1000 MG/10ML IV SOLN
1.5000 mg/kg/h | INTRAVENOUS | Status: AC
  Administered 2022-03-28: 1.5 mg/kg/h via INTRAVENOUS
  Filled 2022-03-27: qty 25

## 2022-03-27 MED ORDER — CEFAZOLIN SODIUM-DEXTROSE 2-4 GM/100ML-% IV SOLN
2.0000 g | INTRAVENOUS | Status: AC
  Administered 2022-03-28 (×2): 2 g via INTRAVENOUS
  Filled 2022-03-27: qty 100

## 2022-03-27 MED ORDER — NITROGLYCERIN IN D5W 200-5 MCG/ML-% IV SOLN
2.0000 ug/min | INTRAVENOUS | Status: AC
  Administered 2022-03-28: 19.5 ug/min via INTRAVENOUS
  Filled 2022-03-27: qty 250

## 2022-03-27 MED ORDER — CHLORHEXIDINE GLUCONATE CLOTH 2 % EX PADS
6.0000 | MEDICATED_PAD | Freq: Once | CUTANEOUS | Status: AC
  Administered 2022-03-28: 6 via TOPICAL

## 2022-03-27 MED ORDER — PLASMA-LYTE A IV SOLN
INTRAVENOUS | Status: DC
  Filled 2022-03-27: qty 2.5

## 2022-03-27 MED ORDER — CHLORHEXIDINE GLUCONATE CLOTH 2 % EX PADS
6.0000 | MEDICATED_PAD | Freq: Once | CUTANEOUS | Status: AC
  Administered 2022-03-27: 6 via TOPICAL

## 2022-03-27 MED ORDER — PHENYLEPHRINE HCL-NACL 20-0.9 MG/250ML-% IV SOLN
30.0000 ug/min | INTRAVENOUS | Status: AC
  Administered 2022-03-28: 20 ug/min via INTRAVENOUS
  Filled 2022-03-27: qty 250

## 2022-03-27 MED ORDER — EPINEPHRINE HCL 5 MG/250ML IV SOLN IN NS
0.0000 ug/min | INTRAVENOUS | Status: DC
  Filled 2022-03-27: qty 250

## 2022-03-27 MED ORDER — NOREPINEPHRINE 4 MG/250ML-% IV SOLN
0.0000 ug/min | INTRAVENOUS | Status: DC
  Filled 2022-03-27: qty 250

## 2022-03-27 NOTE — Anesthesia Preprocedure Evaluation (Addendum)
Anesthesia Evaluation  Patient identified by MRN, date of birth, ID band Patient awake    Reviewed: Allergy & Precautions, NPO status , Patient's Chart, lab work & pertinent test results  History of Anesthesia Complications Negative for: history of anesthetic complications  Airway Mallampati: II  TM Distance: >3 FB Neck ROM: Full    Dental  (+) Chipped, Missing, Dental Advisory Given   Pulmonary former smoker,    breath sounds clear to auscultation       Cardiovascular hypertension, Pt. on medications + angina + CAD, + Past MI (NSTEMI. He was put on Nitro and Heparin drips) and + CABG (SVG to OM and SVG to PDA are occluded.  )   Rhythm:Regular Rate:Normal  03/23/2022 ECHO: EF 60-65%. The LV has normal function, no regional wall motion abnormalities. There is mild concentric LVH, Grade I DD, aortic sclerosis without stenosis   Neuro/Psych negative neurological ROS  negative psych ROS   GI/Hepatic Neg liver ROS, GERD  Medicated and Controlled,  Endo/Other  diabetes (glu 132), Oral Hypoglycemic Agents, Insulin DependentHypothyroidism   Renal/GU Renal InsufficiencyRenal diseasestones     Musculoskeletal   Abdominal (+) + obese,   Peds  Hematology  (+) Blood dyscrasia (Hb 11.9), anemia ,   Anesthesia Other Findings   Reproductive/Obstetrics                            Anesthesia Physical Anesthesia Plan  ASA: 4  Anesthesia Plan: General   Post-op Pain Management:    Induction: Intravenous  PONV Risk Score and Plan: 2 and Treatment may vary due to age or medical condition  Airway Management Planned: Oral ETT  Additional Equipment: Arterial line, PA Cath, TEE and Ultrasound Guidance Line Placement  Intra-op Plan:   Post-operative Plan: Post-operative intubation/ventilation  Informed Consent: I have reviewed the patients History and Physical, chart, labs and discussed the procedure  including the risks, benefits and alternatives for the proposed anesthesia with the patient or authorized representative who has indicated his/her understanding and acceptance.     Dental advisory given  Plan Discussed with: CRNA and Surgeon  Anesthesia Plan Comments:        Anesthesia Quick Evaluation

## 2022-03-27 NOTE — Progress Notes (Signed)
ABG obtained and sent to lab. °

## 2022-03-27 NOTE — Progress Notes (Addendum)
Progress Note  Patient Name: Ryan Herrera Date of Encounter: 03/27/2022  [facility] Cardiologist: Janina Mayo, MD   Subjective   Still with right sided pain, improved with heat packs last evening.   Inpatient Medications    Scheduled Meds:  allopurinol  100 mg Oral QHS   aspirin EC  81 mg Oral Daily   bacitracin   Topical BID   empagliflozin  10 mg Oral q morning   ezetimibe  10 mg Oral Daily   insulin aspart  0-15 Units Subcutaneous TID WC   insulin glargine-yfgn  20 Units Subcutaneous Daily   levothyroxine  100 mcg Oral Q0600   losartan  25 mg Oral Daily   pantoprazole  40 mg Oral Daily   sodium chloride flush  3 mL Intravenous Q12H   sodium chloride flush  3 mL Intravenous Q12H   Continuous Infusions:  sodium chloride     heparin 1,550 Units/hr (03/27/22 0557)   nitroGLYCERIN 65 mcg/min (03/27/22 0557)   PRN Meds: sodium chloride, acetaminophen, magnesium hydroxide, morphine injection, nitroGLYCERIN, nitroGLYCERIN, ondansetron (ZOFRAN) IV, sodium chloride flush   Vital Signs    Vitals:   03/26/22 0736 03/26/22 1551 03/26/22 2031 03/27/22 0543  BP:   118/66 113/60  Pulse:   74 78  Resp:   18 16  Temp: 98 F (36.7 C) 97.6 F (36.4 C) 98.2 F (36.8 C) 99 F (37.2 C)  TempSrc: Axillary Oral Oral Oral  SpO2:   96% 95%  Weight:      Height:        Intake/Output Summary (Last 24 hours) at 03/27/2022 0836 Last data filed at 03/27/2022 0557 Gross per 24 hour  Intake 1623.6 ml  Output 1350 ml  Net 273.6 ml      03/22/2022    3:00 PM 03/19/2022    1:27 PM 03/12/2022   11:38 AM  Last 3 Weights  Weight (lbs) 198 lb 198 lb 12.8 oz 200 lb 9.6 oz  Weight (kg) 89.812 kg 90.175 kg 90.992 kg      Telemetry    Sinus Rhythm - Personally Reviewed  ECG    No new tracing  Physical Exam   GEN: No acute distress.   Neck: No JVD Cardiac: RRR, no murmurs, rubs, or gallops.  Respiratory: Clear to auscultation bilaterally. GI: Soft, tenderness  to RLQ, non-distended  MS: No edema; No deformity. Neuro:  Nonfocal  Psych: Normal affect   Labs    High Sensitivity Troponin:   Recent Labs  Lab 03/22/22 1517 03/22/22 1659 03/23/22 0456  TROPONINIHS 283* 421* 1,667*     Chemistry Recent Labs  Lab 03/22/22 1517 03/23/22 0110 03/25/22 0322 03/26/22 0310 03/27/22 0218  NA 136   < > 132* 135 134*  K 4.2   < > 3.8 3.7 3.9  CL 104   < > 100 104 104  CO2 24   < > '22 22 23  '$ GLUCOSE 216*   < > 175* 145* 192*  BUN 25*   < > 25* 23 22  CREATININE 1.13   < > 1.31* 1.13 1.20  CALCIUM 9.5   < > 8.9 8.5* 8.4*  PROT 7.3  --   --   --   --   ALBUMIN 3.9  --   --   --   --   AST 23  --   --   --   --   ALT 22  --   --   --   --  ALKPHOS 57  --   --   --   --   BILITOT 0.5  --   --   --   --   GFRNONAA >60   < > 56* >60 >60  ANIONGAP 8   < > '10 9 7   '$ < > = values in this interval not displayed.    Lipids No results for input(s): "CHOL", "TRIG", "HDL", "LABVLDL", "LDLCALC", "CHOLHDL" in the last 168 hours.  Hematology Recent Labs  Lab 03/25/22 0322 03/26/22 0310 03/27/22 0218  WBC 8.3 6.7 6.6  RBC 4.01* 3.83* 3.67*  HGB 12.4* 12.1* 11.4*  HCT 37.3* 35.8* 34.0*  MCV 93.0 93.5 92.6  MCH 30.9 31.6 31.1  MCHC 33.2 33.8 33.5  RDW 13.5 13.7 13.5  PLT 211 196 204   Thyroid  Recent Labs  Lab 03/24/22 0743  TSH 2.349  FREET4 1.14*    BNPNo results for input(s): "BNP", "PROBNP" in the last 168 hours.  DDimer No results for input(s): "DDIMER" in the last 168 hours.   Radiology    VAS US DOPPLER PRE CABG  Result Date: 03/26/2022 PREOPERATIVE VASCULAR EVALUATION Patient Name:  Ryan Herrera  Date of Exam:   03/26/2022 Medical Rec #: [id number]                  Accession #:    [id number] Date of Birth: 06-20-1946                  Patient Gender: M Patient Age:   34 years Exam Location:  [facility] Procedure:      VAS US DOPPLER PRE CABG Referring Phys: Remo Lipps HENDRICKSON  --------------------------------------------------------------------------------  Indications:            Pre-CABG. Risk Factors:           Hypertension, hyperlipidemia, Diabetes, past history of                         smoking, prior MI, coronary artery disease. Vascular Interventions: CKD. Comparison Study:       No previous exams Performing Technologist: Rogelia Rohrer RVT, RDMS Supporting Technologist: Darlin Coco RDMS, RVT  Examination Guidelines: A complete evaluation includes B-mode imaging, spectral Doppler, color Doppler, and power Doppler as needed of all accessible portions of each vessel. Bilateral testing is considered an integral part of a complete examination. Limited examinations for reoccurring indications may be performed as noted.  Right Carotid Findings: +----------+--------+--------+--------+------------------+---------------------+           PSV cm/sEDV cm/sStenosisDescribe          Comments              +----------+--------+--------+--------+------------------+---------------------+ CCA Prox  81      11                                                      +----------+--------+--------+--------+------------------+---------------------+ CCA Distal289     42      >50%    calcific and  hypoechoic                              +----------+--------+--------+--------+------------------+---------------------+ ICA Prox  134     54      40-59%  calcific and      Velocities may                                          hypoechoic        underestimate degree                                                      of stenosis due to                                                        more proximal                                                             obstruction.          +----------+--------+--------+--------+------------------+---------------------+ ICA Mid   70      13                                                       +----------+--------+--------+--------+------------------+---------------------+ ICA Distal79      30                                                      +----------+--------+--------+--------+------------------+---------------------+ ECA       243     10      >50%                                            +----------+--------+--------+--------+------------------+---------------------+ +----------+--------+-------+----------------+------------+           PSV cm/sEDV cmsDescribe        Arm Pressure +----------+--------+-------+----------------+------------+ Subclavian126            Multiphasic, WNL             +----------+--------+-------+----------------+------------+ +---------+--------+--+--------+--+---------+ VertebralPSV cm/s52EDV cm/s14Antegrade +---------+--------+--+--------+--+---------+ Left Carotid Findings: +----------+--------+--------+--------+-----------------------+--------+           PSV cm/sEDV cm/sStenosisDescribe               Comments +----------+--------+--------+--------+-----------------------+--------+ CCA Prox  105     13                                              +----------+--------+--------+--------+-----------------------+--------+  CCA Distal94      18                                              +----------+--------+--------+--------+-----------------------+--------+ ICA Prox  177     44      40-59%  calcific and hypoechoic         +----------+--------+--------+--------+-----------------------+--------+ ICA Mid   162     41      40-59%                                  +----------+--------+--------+--------+-----------------------+--------+ ICA Distal102     30                                              +----------+--------+--------+--------+-----------------------+--------+ ECA       248             >50%                                     +----------+--------+--------+--------+-----------------------+--------+ +----------+--------+--------+----------------+------------+ SubclavianPSV cm/sEDV cm/sDescribe        Arm Pressure +----------+--------+--------+----------------+------------+           167             Multiphasic, WNL             +----------+--------+--------+----------------+------------+ +---------+--------+--+--------+-+--------+ VertebralPSV cm/s15EDV cm/s5dampened +---------+--------+--+--------+-+--------+  ABI Findings: +---------+------------------+-----+----------+--------+ Right    Rt Pressure (mmHg)IndexWaveform  Comment  +---------+------------------+-----+----------+--------+ Brachial 127                    triphasic          +---------+------------------+-----+----------+--------+ PTA      143               1.13 biphasic           +---------+------------------+-----+----------+--------+ DP       98                0.77 monophasic         +---------+------------------+-----+----------+--------+ Great Toe111               0.87 Normal             +---------+------------------+-----+----------+--------+ +---------+------------------+-----+---------+-------+ Left     Lt Pressure (mmHg)IndexWaveform Comment +---------+------------------+-----+---------+-------+ Brachial 122                    triphasic        +---------+------------------+-----+---------+-------+ PTA      145               1.14 triphasic        +---------+------------------+-----+---------+-------+ DP       123               0.97 biphasic         +---------+------------------+-----+---------+-------+ Great Toe96                0.76 Normal           +---------+------------------+-----+---------+-------+ +-------+---------------+----------------+ ABI/TBIToday's ABI/TBIPrevious ABI/TBI +-------+---------------+----------------+ Right  1.13 / 0.87                      +-------+---------------+----------------+  Left   1.14 / 0.76                     +-------+---------------+----------------+  Right Doppler Findings: +--------+--------+-----+---------+--------+ Site    PressureIndexDoppler  Comments +--------+--------+-----+---------+--------+ [id number]          triphasic         +--------+--------+-----+---------+--------+ Radial               triphasic         +--------+--------+-----+---------+--------+ Ulnar                triphasic         +--------+--------+-----+---------+--------+  Left Doppler Findings: +--------+--------+-----+---------+--------+ Site    PressureIndexDoppler  Comments +--------+--------+-----+---------+--------+ [id number]          triphasic         +--------+--------+-----+---------+--------+ Radial               triphasic         +--------+--------+-----+---------+--------+ Ulnar                triphasic         +--------+--------+-----+---------+--------+  Summary: Right Carotid: Velocities in the right ICA are consistent with a 40-59%                stenosis. Hemodynamically significant plaque >50% visualized in                the CCA. The ECA appears >50% stenosed. Left Carotid: Velocities in the left ICA are consistent with a 40-59% stenosis.               The ECA appears >50% stenosed. Vertebrals:  Right vertebral artery demonstrates antegrade flow. Left vertebral              artery demonstrates dampened flow. Subclavians: Normal flow hemodynamics were seen in bilateral subclavian              arteries. Right ABI: Resting right ankle-brachial index is within normal range. No evidence of significant right lower extremity arterial disease. The right toe-brachial index is normal. Left ABI: Resting left ankle-brachial index is within normal range. No evidence of significant left lower extremity arterial disease. The left toe-brachial index is normal. Bilateral Extremity: Doppler waveforms remain  within normal limits with compression bilaterally for the radial arteries. Doppler waveforms remain within normal limits with compression bilaterally for the ulnar arteries.  Electronically signed by Deitra Mayo MD on 03/26/2022 at 1:35:28 PM.    Final    DG Abd Portable 1V  Result Date: 03/26/2022 CLINICAL DATA:  RIGHT lower abdominal pain, history of STEMI EXAM: PORTABLE ABDOMEN - 1 VIEW COMPARISON:  Portable exam 0904 hours compared to CT abdomen and pelvis of 08/10/2018 FINDINGS: Nonobstructive bowel gas pattern. No bowel dilatation bowel wall thickening. Prior lumbar fusion. Faint rounded density RIGHT upper quadrant 24 mm diameter consistent with cholelithiasis. Urolift clips at prostate gland. IMPRESSION: No acute abnormalities. Cholelithiasis. Electronically Signed   By: Lavonia Dana M.D.   On: 03/26/2022 09:11   CARDIAC CATHETERIZATION  Result Date: 03/25/2022   Ramus lesion is 80% stenosed.   Lat Ramus lesion is 80% stenosed.   Mid Cx lesion is 75% stenosed.  SVG to OM is occulded.   LPAV lesion is 90% stenosed.   Mid LAD lesion is 100% stenosed. LIMA to LAD is patent.   Origin to Prox Graft lesion is 100% stenosed.   Origin to Prox Graft lesion is 100% stenosed.  Prox RCA lesion is 90% stenosed with 90% stenosed side branch in 1st RPL.  SVG to PDA is occluded.   The left ventricular systolic function is normal.   LV end diastolic pressure is normal.   The left ventricular ejection fraction is 55-65% by visual estimate.   There is no aortic valve stenosis. Severe disease of the circumflex and branches and well as RCA and branches.  RCA has a sharp angulation and has several branches that arise proximally that would make PCI difficult.  Severe calcific disease in both branches of a ramus vessel and in the left PDA.  SVG to OM and SVG to PDA are occluded.  Consult surgery for possible redo CABG. Prior surgery was done at [facility] in 2008.    Cardiac Studies   Cath: 03/25/22     Ramus lesion  is 80% stenosed.   Lat Ramus lesion is 80% stenosed.   Mid Cx lesion is 75% stenosed.  SVG to OM is occulded.   LPAV lesion is 90% stenosed.   Mid LAD lesion is 100% stenosed. LIMA to LAD is patent.   Origin to Prox Graft lesion is 100% stenosed.   Origin to Prox Graft lesion is 100% stenosed.   Prox RCA lesion is 90% stenosed with 90% stenosed side branch in 1st RPL.  SVG to PDA is occluded.   The left ventricular systolic function is normal.   LV end diastolic pressure is normal.   The left ventricular ejection fraction is 55-65% by visual estimate.   There is no aortic valve stenosis.   Severe disease of the circumflex and branches and well as RCA and branches.  RCA has a sharp angulation and has several branches that arise proximally that would make PCI difficult.  Severe calcific disease in both branches of a ramus vessel and in the left PDA.     SVG to OM and SVG to PDA are occluded.     Consult surgery for possible redo CABG. Prior surgery was done at [facility] in 2008.    Diagnostic Dominance: Co-dominant  Echo: 03/23/22   IMPRESSIONS     1. Left ventricular ejection fraction, by estimation, is 60 to 65%. The  left ventricle has normal function. The left ventricle has no regional  wall motion abnormalities. There is mild concentric left ventricular  hypertrophy. Left ventricular diastolic  parameters are consistent with Grade I diastolic dysfunction (impaired  relaxation).   2. Right ventricular systolic function is normal. The right ventricular  size is normal. There is normal pulmonary artery systolic pressure. The  estimated right ventricular systolic pressure is 16.1 mmHg.   3. The mitral valve is grossly normal. No evidence of mitral valve  regurgitation. No evidence of mitral stenosis. The mean mitral valve  gradient is 2.0 mmHg. Moderate mitral annular calcification.   4. The aortic valve is tricuspid. There is moderate calcification of the  aortic valve. There is  moderate thickening of the aortic valve. Aortic  valve regurgitation is not visualized. Aortic valve  sclerosis/calcification is present, without any evidence  of aortic stenosis. Aortic valve mean gradient measures 5.0 mmHg. Normal  LV stroke volume.   5. Aortic dilatation noted. There is mild dilatation of the aortic root,  measuring 40 mm. There is mild dilatation of the ascending aorta,  measuring 41 mm. Repeat imaging recommended in one year if clinically  indicated.   6. The inferior vena cava is normal in size with greater than 50%  respiratory variability, suggesting  right atrial pressure of 3 mmHg.   Comparison(s): No prior Echocardiogram.   FINDINGS   Left Ventricle: Calcified papillary muscle. Left ventricular ejection  fraction, by estimation, is 60 to 65%. The left ventricle has normal  function. The left ventricle has no regional wall motion abnormalities.  The left ventricular internal cavity size  was normal in size. There is mild concentric left ventricular hypertrophy.  Left ventricular diastolic parameters are consistent with Grade I  diastolic dysfunction (impaired relaxation).   Right Ventricle: The right ventricular size is normal. No increase in  right ventricular wall thickness. Right ventricular systolic function is  normal. There is normal pulmonary artery systolic pressure. The tricuspid  regurgitant velocity is 2.24 m/s, and   with an assumed right atrial pressure of 3 mmHg, the estimated right  ventricular systolic pressure is 44.8 mmHg.   Left Atrium: Left atrial size was normal in size.   Right Atrium: Right atrial size was normal in size.   Pericardium: There is no evidence of pericardial effusion. Presence of  epicardial fat layer.   Mitral Valve: MVA by 2d Planimetry. The mitral valve is grossly normal.  Moderate mitral annular calcification. No evidence of mitral valve  regurgitation. No evidence of mitral valve stenosis. The mean mitral valve   gradient is 2.0 mmHg with average heart  rate of 64 bpm.   Tricuspid Valve: The tricuspid valve is normal in structure. Tricuspid  valve regurgitation is trivial. No evidence of tricuspid stenosis.   Aortic Valve: The aortic valve is tricuspid. There is moderate  calcification of the aortic valve. There is moderate thickening of the  aortic valve. There is moderate aortic valve annular calcification. Aortic  valve regurgitation is not visualized. Aortic   valve sclerosis/calcification is present, without any evidence of aortic  stenosis. Aortic valve mean gradient measures 5.0 mmHg. Aortic valve peak  gradient measures 8.8 mmHg. Aortic valve area, by VTI measures 2.45 cm.   Pulmonic Valve: The pulmonic valve was normal in structure. Pulmonic valve  regurgitation is mild. No evidence of pulmonic stenosis.   Aorta: Aortic dilatation noted. There is mild dilatation of the aortic  root, measuring 40 mm. There is mild dilatation of the ascending aorta,  measuring 41 mm.   Venous: The inferior vena cava is normal in size with greater than 50%  respiratory variability, suggesting right atrial pressure of 3 mmHg.   IAS/Shunts: No atrial level shunt detected by color flow Doppler.   Patient Profile     76 y.o. male with hx of CAD s/p CABG (09/2007 at [facility], normal NST in 12/2012) hypertension, hyperlipidemia, diabetes, and prior tobacco abuse admitted with NSTEMI.  Transferred from [facility] to [facility] for cath.  Assessment & Plan    CAD s/p prior CABG, here with NSTEMI: Underwent cardiac cath noted above with severe disease in LCx/branches as well as RCA/branches with occluded SVG-OM and SVG-PDA, patent LIMA-LAD. TCTS consulted for redo CABG. Seen by Dr. Roxan Hockey with surgery on Thursday  -- Continue ASA, heparin, Zetia (on Repatha as OP)   Essential HTN: stable -- metoprolol was stopped, along with HCTZ/losartan held for cath -- continue losartan '25mg'$  daily     Hyperlipidemia: LDL 63 on 03/12/22 -- continue Zetia, Repatha as OP (statin intolerant)   Hypothyroidism: last TSH 03/12/22 was 5.760, started on levothyroxine 167mg daily by primary care with plan for follow-up visit in 6 weeks -- Lab with Normal TSH at 2.3, TSH 1.14 >> follow up with PCP post  discharge    Diabetes mellitus: Hgb A1c 7.6 -- SSI -- continue jardiance   Finger injury: -- patient reports he was instructed to keep dressing on until skin begins to heal back over. Seen by ortho with recs noted  -- continue OP f/u with hand surgery   Mobitz 1:  -- No reoccurance with holding BB   AKI: resolved    RLQ/hip/flank pain: started 6/19 afternoon. No fevers, WBC WNL -- KUB with no acute finding, UA ok -- improved with heat pack, suspect MSK in nature. Needs to get up to the chair today  For questions or updates, please contact [place] Please consult [email or web address] for contact info under        Signed, Reino Bellis, NP  03/27/2022, 8:36 AM    I have personally seen and examined this patient. I agree with the assessment and plan as outlined above.  No pain today. Planning for CABG tomorrow. Continue current therapy.   Lauree Chandler 03/27/2022 10:20 AM

## 2022-03-27 NOTE — Progress Notes (Signed)
CARDIAC REHAB PHASE I   PRE:  Rate/Rhythm: 70 SR    BP: sitting 131/60    SaO2: 98 RA  MODE:  Ambulation: 400 ft   POST:  Rate/Rhythm: 84 SR    BP: sitting 123/64     SaO2: 99 RA  Tolerated well, thankful to ambulate. No angina. When asked, pt does st he has right flank and lower abdomen pain plus achilles pain. Did not change with ambulation. To recliner.   Discussed with pt and family IS (1750 ml), sternal precautions, and mobility post op. Pt and family receptive, wife and daughter supportive. Gave Scientist, clinical (histocompatibility and immunogenetics). Encouraged mobility as tolerated today. [place], ACSM 03/27/2022 11:30 AM

## 2022-03-27 NOTE — Progress Notes (Signed)
2 Days Post-Op Procedure(s) (LRB): LEFT HEART CATH AND CORS/GRAFTS ANGIOGRAPHY (N/A) Subjective: No chest pain, right flank pain nearly resolved  Objective: Vital signs in last 24 hours: Temp:  [97.7 F (36.5 C)-99 F (37.2 C)] 97.9 F (36.6 C) (06/21 1330) Pulse Rate:  [63-78] 63 (06/21 1330) Cardiac Rhythm: Normal sinus rhythm (06/21 0900) Resp:  [16-18] 16 (06/21 0730) BP: (111-131)/(60-67) 116/67 (06/21 1330) SpO2:  [94 %-96 %] 96 % (06/21 1330)  Hemodynamic parameters for last 24 hours:    Intake/Output from previous day: 06/20 0701 - 06/21 0700 In: 1623.6 [P.O.:600; I.V.:1023.6] Out: 1600 [Urine:1600] Intake/Output this shift: Total I/O In: 240 [P.O.:240] Out: 450 [Urine:450]  General appearance: alert, cooperative, and no distress Neurologic: intact Heart: regular rate and rhythm Lungs: clear to auscultation bilaterally Abdomen: normal findings: soft, non-tender  Lab Results: Recent Labs    03/26/22 0310 03/27/22 0218  WBC 6.7 6.6  HGB 12.1* 11.4*  HCT 35.8* 34.0*  PLT 196 204   BMET:  Recent Labs    03/26/22 0310 03/27/22 0218  NA 135 134*  K 3.7 3.9  CL 104 104  CO2 22 23  GLUCOSE 145* 192*  BUN 23 22  CREATININE 1.13 1.20  CALCIUM 8.5* 8.4*    PT/INR: No results for input(s): "LABPROT", "INR" in the last 72 hours. ABG No results found for: "PHART", "HCO3", "TCO2", "ACIDBASEDEF", "O2SAT" CBG (last 3)  Recent Labs    03/26/22 2105 03/27/22 0748 03/27/22 1113  GLUCAP 259* 158* 173*    Assessment/Plan: S/P Procedure(s) (LRB): LEFT HEART CATH AND CORS/GRAFTS ANGIOGRAPHY (N/A) Severe native 3 vessel CAD, 2 vessel graft disease  s/p nonSTEMI For redo CABG tomorrow Allen's test limited due to inability to close hand tightly but vascular labs ok, will assess left radial artery as possible graft.   LOS: 5 days    Melrose Nakayama 03/27/2022

## 2022-03-27 NOTE — Progress Notes (Signed)
ANTICOAGULATION CONSULT NOTE  Pharmacy Consult for heparin Indication: chest pain/ACS, possible CABG 6/22  Allergies  Allergen Reactions   Myrbetriq [Mirabegron] Other (See Comments)    Unknown reaction   Niacin And Related Itching   Other Diarrhea and Nausea And Vomiting    PT does not want OPIOIDS   GLP-1 drugs - Nausea, vomiting, diarrhea, belching   Statins Itching    Patient Measurements: Height: 5' 6.5" (168.9 cm) Weight: 89.8 kg (198 lb) IBW/kg (Calculated) : 64.95 Heparin Dosing Weight: 89kg  Vital Signs: Temp: 98.2 F (36.8 C) (06/20 2031) Temp Source: Oral (06/20 2031) BP: 118/66 (06/20 2031) Pulse Rate: 74 (06/20 2031)  Labs: Recent Labs    03/24/22 0743 03/25/22 0322 03/26/22 0310 03/26/22 1645 03/27/22 0218  HGB 12.9* 12.4* 12.1*  --  11.4*  HCT 39.0 37.3* 35.8*  --  34.0*  PLT 213 211 196  --  204  HEPARINUNFRC 0.36 0.12*  --  0.17* 0.30  CREATININE 1.20 1.31* 1.13  --   --      Estimated Creatinine Clearance: 57.1 mL/min (by C-G formula based on SCr of 1.13 mg/dL).   Assessment: 7 yoM with hx CABG admitted with CP and elevated troponins. Pt is not on AC PTA. Heparin started for ACS rule out. LHC 6/19 with mvCAD not amenable to PCI, CVTS consulted for CABG. Heparin to resume while awaiting plan.  Heparin level subtherapeutic (0.17) on infusion at 1350 units/hr. No issues with line or bleeding reported per RN.  6/21 AM update: Heparin level therapeutic  Possible CABG 6/22  Goal of Therapy:  Heparin level 0.3-0.7 units/ml Monitor platelets by anticoagulation protocol: Yes   Plan:  Cont heparin 1550 units/hr 1200 heparin level  Narda Bonds, PharmD, [place] Pharmacist Phone: [phone]

## 2022-03-27 NOTE — Progress Notes (Signed)
ANTICOAGULATION CONSULT NOTE  Pharmacy Consult for heparin Indication: chest pain/ACS, possible CABG 6/22  Allergies  Allergen Reactions   Myrbetriq [Mirabegron] Other (See Comments)    Unknown reaction   Niacin And Related Itching   Other Diarrhea and Nausea And Vomiting    PT does not want OPIOIDS   GLP-1 drugs - Nausea, vomiting, diarrhea, belching   Statins Itching    Patient Measurements: Height: 5' 6.5" (168.9 cm) Weight: 89.8 kg (198 lb) IBW/kg (Calculated) : 64.95 Heparin Dosing Weight: 89kg  Vital Signs: Temp: 97.7 F (36.5 C) (06/21 0730) Temp Source: Oral (06/21 0730) BP: 123/64 (06/21 1015) Pulse Rate: 66 (06/21 0730)  Labs: Recent Labs    03/25/22 0322 03/26/22 0310 03/26/22 1645 03/27/22 0218 03/27/22 1152  HGB 12.4* 12.1*  --  11.4*  --   HCT 37.3* 35.8*  --  34.0*  --   PLT 211 196  --  204  --   HEPARINUNFRC 0.12*  --  0.17* 0.30 0.45  CREATININE 1.31* 1.13  --  1.20  --      Estimated Creatinine Clearance: 53.7 mL/min (by C-G formula based on SCr of 1.2 mg/dL).   Assessment: 40 yoM with hx CABG admitted with CP and elevated troponins. Pt is not on AC PTA. Heparin started for ACS rule out. LHC 6/19 with mvCAD not amenable to PCI, CVTS consulted for CABG. Heparin to resume while awaiting plan.  Heparin level therapeutic at 0.45 on confirmatory check.  Goal of Therapy:  Heparin level 0.3-0.7 units/ml Monitor platelets by anticoagulation protocol: Yes   Plan:  Cont heparin 1550 units/hr Daily heparin level and CBC  Arrie Senate, PharmD, [place], [facility] Clinical Pharmacist [phone] Please check AMION for all [facility] Pharmacy numbers 03/27/2022

## 2022-03-27 NOTE — Inpatient Diabetes Management (Signed)
Inpatient Diabetes Program Recommendations  AACE/ADA: New Consensus Statement on Inpatient Glycemic Control (2015)  Target Ranges:  Prepandial:   less than 140 mg/dL      Peak postprandial:   less than 180 mg/dL (1-2 hours)      Critically ill patients:  140 - 180 mg/dL    Latest Reference Range & Units 03/26/22 08:09 03/26/22 11:52 03/26/22 16:03 03/26/22 21:05  Glucose-Capillary 70 - 99 mg/dL 140 (H)  2 units Novolog  20 units Semglee '@0945'$   215 (H)  5 units Novolog  195 (H)  3 units Novolog  259 (H)  (H): Data is abnormally high  Latest Reference Range & Units 03/27/22 07:48  Glucose-Capillary 70 - 99 mg/dL 158 (H)  (H): Data is abnormally high   Home DM Meds: Glipizide 5 mg daily      Tresiba 20 units QHS      Jardiance 10 mg daily      Metformin 1000 mg BID   Current Orders: Semglee 20 units daily Novolog 0-15 units TID  Jardiance 10 mg Daily     MD- Note pt will be having Surgery tomorrow and will be placed on IV Insulin Drip.    Will follow    --Will follow patient during hospitalization--  Wyn Quaker RN, MSN, CDE Diabetes Coordinator Inpatient Glycemic Control Team Team Pager: [phone] (8a-5p)

## 2022-03-27 NOTE — Progress Notes (Signed)
Floor called, spoke with [employer]. Covid test needed prior to surgery tomorrow, order placed.

## 2022-03-28 ENCOUNTER — Inpatient Hospital Stay (HOSPITAL_COMMUNITY)
Admission: EM | Disposition: A | Discharge status: Home / Self Care | Payer: Self-pay | Source: Home / Self Care | Attending: Internal Medicine

## 2022-03-28 ENCOUNTER — Inpatient Hospital Stay (HOSPITAL_COMMUNITY): Payer: Medicare HMO

## 2022-03-28 ENCOUNTER — Inpatient Hospital Stay (HOSPITAL_COMMUNITY): Payer: Medicare HMO | Admitting: Anesthesiology

## 2022-03-28 ENCOUNTER — Encounter (HOSPITAL_COMMUNITY): Payer: Self-pay | Admitting: Internal Medicine

## 2022-03-28 ENCOUNTER — Other Ambulatory Visit: Payer: Self-pay

## 2022-03-28 DIAGNOSIS — Z951 Presence of aortocoronary bypass graft: Secondary | ICD-10-CM

## 2022-03-28 DIAGNOSIS — I214 Non-ST elevation (NSTEMI) myocardial infarction: Secondary | ICD-10-CM

## 2022-03-28 DIAGNOSIS — I10 Essential (primary) hypertension: Secondary | ICD-10-CM

## 2022-03-28 DIAGNOSIS — E039 Hypothyroidism, unspecified: Secondary | ICD-10-CM

## 2022-03-28 DIAGNOSIS — I25119 Atherosclerotic heart disease of native coronary artery with unspecified angina pectoris: Secondary | ICD-10-CM | POA: Diagnosis not present

## 2022-03-28 DIAGNOSIS — I2510 Atherosclerotic heart disease of native coronary artery without angina pectoris: Secondary | ICD-10-CM

## 2022-03-28 HISTORY — PX: CORONARY ARTERY BYPASS GRAFT: SHX141

## 2022-03-28 HISTORY — PX: TEE WITHOUT CARDIOVERSION: SHX5443

## 2022-03-28 LAB — ECHO INTRAOPERATIVE TEE
AV Mean grad: 4 mmHg
Height: 66.5 in
Weight: 3168 oz

## 2022-03-28 LAB — POCT ACTIVATED CLOTTING TIME
Activated Clotting Time: 163 seconds
Activated Clotting Time: 516 seconds
Activated Clotting Time: 523 seconds
Activated Clotting Time: 523 seconds
Activated Clotting Time: 543 seconds
Activated Clotting Time: 469 seconds
Activated Clotting Time: 516 seconds
Activated Clotting Time: 412 seconds
Activated Clotting Time: 496 seconds
Activated Clotting Time: 114 seconds
Activated Clotting Time: 377 seconds

## 2022-03-28 LAB — POCT I-STAT 7, (LYTES, BLD GAS, ICA,H+H)
Acid-Base Excess: 1 mmol/L (ref 0.0–2.0)
Acid-Base Excess: 1 mmol/L (ref 0.0–2.0)
Acid-base deficit: 1 mmol/L (ref 0.0–2.0)
Acid-base deficit: 6 mmol/L — ABNORMAL HIGH (ref 0.0–2.0)
Acid-base deficit: 4 mmol/L — ABNORMAL HIGH (ref 0.0–2.0)
Acid-base deficit: 6 mmol/L — ABNORMAL HIGH (ref 0.0–2.0)
Bicarbonate: 26.4 mmol/L (ref 20.0–28.0)
Bicarbonate: 25.6 mmol/L (ref 20.0–28.0)
Bicarbonate: 24.7 mmol/L (ref 20.0–28.0)
Bicarbonate: 21.9 mmol/L (ref 20.0–28.0)
Bicarbonate: 21.4 mmol/L (ref 20.0–28.0)
Bicarbonate: 20.6 mmol/L (ref 20.0–28.0)
Calcium, Ion: 1.25 mmol/L (ref 1.15–1.40)
Calcium, Ion: 1.08 mmol/L — ABNORMAL LOW (ref 1.15–1.40)
Calcium, Ion: 1.27 mmol/L (ref 1.15–1.40)
Calcium, Ion: 1.23 mmol/L (ref 1.15–1.40)
Calcium, Ion: 1.19 mmol/L (ref 1.15–1.40)
Calcium, Ion: 1.22 mmol/L (ref 1.15–1.40)
HCT: 31 % — ABNORMAL LOW (ref 39.0–52.0)
HCT: 27 % — ABNORMAL LOW (ref 39.0–52.0)
HCT: 26 % — ABNORMAL LOW (ref 39.0–52.0)
HCT: 32 % — ABNORMAL LOW (ref 39.0–52.0)
HCT: 30 % — ABNORMAL LOW (ref 39.0–52.0)
HCT: 28 % — ABNORMAL LOW (ref 39.0–52.0)
Hemoglobin: 10.5 g/dL — ABNORMAL LOW (ref 13.0–17.0)
Hemoglobin: 9.2 g/dL — ABNORMAL LOW (ref 13.0–17.0)
Hemoglobin: 8.8 g/dL — ABNORMAL LOW (ref 13.0–17.0)
Hemoglobin: 10.9 g/dL — ABNORMAL LOW (ref 13.0–17.0)
Hemoglobin: 10.2 g/dL — ABNORMAL LOW (ref 13.0–17.0)
Hemoglobin: 9.5 g/dL — ABNORMAL LOW (ref 13.0–17.0)
O2 Saturation: 100 %
O2 Saturation: 100 %
O2 Saturation: 100 %
O2 Saturation: 97 %
O2 Saturation: 99 %
O2 Saturation: 97 %
Patient temperature: 35.6
Patient temperature: 38
Patient temperature: 37.5
Potassium: 4.2 mmol/L (ref 3.5–5.1)
Potassium: 3.8 mmol/L (ref 3.5–5.1)
Potassium: 4.3 mmol/L (ref 3.5–5.1)
Potassium: 3.8 mmol/L (ref 3.5–5.1)
Potassium: 5.7 mmol/L — ABNORMAL HIGH (ref 3.5–5.1)
Potassium: 5.5 mmol/L — ABNORMAL HIGH (ref 3.5–5.1)
Sodium: 139 mmol/L (ref 135–145)
Sodium: 140 mmol/L (ref 135–145)
Sodium: 139 mmol/L (ref 135–145)
Sodium: 142 mmol/L (ref 135–145)
Sodium: 139 mmol/L (ref 135–145)
Sodium: 138 mmol/L (ref 135–145)
TCO2: 28 mmol/L (ref 22–32)
TCO2: 27 mmol/L (ref 22–32)
TCO2: 26 mmol/L (ref 22–32)
TCO2: 23 mmol/L (ref 22–32)
TCO2: 23 mmol/L (ref 22–32)
TCO2: 22 mmol/L (ref 22–32)
pCO2 arterial: 46.4 mmHg (ref 32–48)
pCO2 arterial: 41.2 mmHg (ref 32–48)
pCO2 arterial: 44.5 mmHg (ref 32–48)
pCO2 arterial: 49.5 mmHg — ABNORMAL HIGH (ref 32–48)
pCO2 arterial: 40.8 mmHg (ref 32–48)
pCO2 arterial: 42.9 mmHg (ref 32–48)
pH, Arterial: 7.364 (ref 7.35–7.45)
pH, Arterial: 7.401 (ref 7.35–7.45)
pH, Arterial: 7.353 (ref 7.35–7.45)
pH, Arterial: 7.246 — ABNORMAL LOW (ref 7.35–7.45)
pH, Arterial: 7.333 — ABNORMAL LOW (ref 7.35–7.45)
pH, Arterial: 7.292 — ABNORMAL LOW (ref 7.35–7.45)
pO2, Arterial: 438 mmHg — ABNORMAL HIGH (ref 83–108)
pO2, Arterial: 367 mmHg — ABNORMAL HIGH (ref 83–108)
pO2, Arterial: 445 mmHg — ABNORMAL HIGH (ref 83–108)
pO2, Arterial: 100 mmHg (ref 83–108)
pO2, Arterial: 143 mmHg — ABNORMAL HIGH (ref 83–108)
pO2, Arterial: 100 mmHg (ref 83–108)

## 2022-03-28 LAB — POCT I-STAT, CHEM 8
BUN: 17 mg/dL (ref 8–23)
BUN: 17 mg/dL (ref 8–23)
BUN: 18 mg/dL (ref 8–23)
BUN: 16 mg/dL (ref 8–23)
BUN: 17 mg/dL (ref 8–23)
BUN: 16 mg/dL (ref 8–23)
Calcium, Ion: 1.17 mmol/L (ref 1.15–1.40)
Calcium, Ion: 1.08 mmol/L — ABNORMAL LOW (ref 1.15–1.40)
Calcium, Ion: 1.2 mmol/L (ref 1.15–1.40)
Calcium, Ion: 1.07 mmol/L — ABNORMAL LOW (ref 1.15–1.40)
Calcium, Ion: 1.1 mmol/L — ABNORMAL LOW (ref 1.15–1.40)
Calcium, Ion: 1.25 mmol/L (ref 1.15–1.40)
Chloride: 104 mmol/L (ref 98–111)
Chloride: 100 mmol/L (ref 98–111)
Chloride: 103 mmol/L (ref 98–111)
Chloride: 105 mmol/L (ref 98–111)
Chloride: 104 mmol/L (ref 98–111)
Chloride: 106 mmol/L (ref 98–111)
Creatinine, Ser: 0.9 mg/dL (ref 0.61–1.24)
Creatinine, Ser: 0.7 mg/dL (ref 0.61–1.24)
Creatinine, Ser: 1 mg/dL (ref 0.61–1.24)
Creatinine, Ser: 0.8 mg/dL (ref 0.61–1.24)
Creatinine, Ser: 0.8 mg/dL (ref 0.61–1.24)
Creatinine, Ser: 0.8 mg/dL (ref 0.61–1.24)
Glucose, Bld: 121 mg/dL — ABNORMAL HIGH (ref 70–99)
Glucose, Bld: 109 mg/dL — ABNORMAL HIGH (ref 70–99)
Glucose, Bld: 130 mg/dL — ABNORMAL HIGH (ref 70–99)
Glucose, Bld: 108 mg/dL — ABNORMAL HIGH (ref 70–99)
Glucose, Bld: 128 mg/dL — ABNORMAL HIGH (ref 70–99)
Glucose, Bld: 132 mg/dL — ABNORMAL HIGH (ref 70–99)
HCT: 34 % — ABNORMAL LOW (ref 39.0–52.0)
HCT: 26 % — ABNORMAL LOW (ref 39.0–52.0)
HCT: 32 % — ABNORMAL LOW (ref 39.0–52.0)
HCT: 24 % — ABNORMAL LOW (ref 39.0–52.0)
HCT: 26 % — ABNORMAL LOW (ref 39.0–52.0)
HCT: 28 % — ABNORMAL LOW (ref 39.0–52.0)
Hemoglobin: 11.6 g/dL — ABNORMAL LOW (ref 13.0–17.0)
Hemoglobin: 10.9 g/dL — ABNORMAL LOW (ref 13.0–17.0)
Hemoglobin: 8.8 g/dL — ABNORMAL LOW (ref 13.0–17.0)
Hemoglobin: 8.2 g/dL — ABNORMAL LOW (ref 13.0–17.0)
Hemoglobin: 8.8 g/dL — ABNORMAL LOW (ref 13.0–17.0)
Hemoglobin: 9.5 g/dL — ABNORMAL LOW (ref 13.0–17.0)
Potassium: 4.1 mmol/L (ref 3.5–5.1)
Potassium: 3.9 mmol/L (ref 3.5–5.1)
Potassium: 4.2 mmol/L (ref 3.5–5.1)
Potassium: 5.2 mmol/L — ABNORMAL HIGH (ref 3.5–5.1)
Potassium: 5.2 mmol/L — ABNORMAL HIGH (ref 3.5–5.1)
Potassium: 4.3 mmol/L (ref 3.5–5.1)
Sodium: 139 mmol/L (ref 135–145)
Sodium: 136 mmol/L (ref 135–145)
Sodium: 137 mmol/L (ref 135–145)
Sodium: 136 mmol/L (ref 135–145)
Sodium: 138 mmol/L (ref 135–145)
Sodium: 141 mmol/L (ref 135–145)
TCO2: 25 mmol/L (ref 22–32)
TCO2: 24 mmol/L (ref 22–32)
TCO2: 27 mmol/L (ref 22–32)
TCO2: 25 mmol/L (ref 22–32)
TCO2: 24 mmol/L (ref 22–32)
TCO2: 23 mmol/L (ref 22–32)

## 2022-03-28 LAB — POCT I-STAT EG7
Acid-base deficit: 1 mmol/L (ref 0.0–2.0)
Bicarbonate: 24.5 mmol/L (ref 20.0–28.0)
Calcium, Ion: 1.1 mmol/L — ABNORMAL LOW (ref 1.15–1.40)
HCT: 27 % — ABNORMAL LOW (ref 39.0–52.0)
Hemoglobin: 9.2 g/dL — ABNORMAL LOW (ref 13.0–17.0)
O2 Saturation: 77 %
Potassium: 3.6 mmol/L (ref 3.5–5.1)
Sodium: 141 mmol/L (ref 135–145)
TCO2: 26 mmol/L (ref 22–32)
pCO2, Ven: 43 mmHg — ABNORMAL LOW (ref 44–60)
pH, Ven: 7.364 (ref 7.25–7.43)
pO2, Ven: 44 mmHg (ref 32–45)

## 2022-03-28 LAB — CBC
HCT: 35.1 % — ABNORMAL LOW (ref 39.0–52.0)
HCT: 34.4 % — ABNORMAL LOW (ref 39.0–52.0)
HCT: 31.4 % — ABNORMAL LOW (ref 39.0–52.0)
Hemoglobin: 11.9 g/dL — ABNORMAL LOW (ref 13.0–17.0)
Hemoglobin: 11.1 g/dL — ABNORMAL LOW (ref 13.0–17.0)
Hemoglobin: 10.1 g/dL — ABNORMAL LOW (ref 13.0–17.0)
MCH: 31.3 pg (ref 26.0–34.0)
MCH: 31.1 pg (ref 26.0–34.0)
MCH: 30.9 pg (ref 26.0–34.0)
MCHC: 33.9 g/dL (ref 30.0–36.0)
MCHC: 32.3 g/dL (ref 30.0–36.0)
MCHC: 32.2 g/dL (ref 30.0–36.0)
MCV: 92.4 fL (ref 80.0–100.0)
MCV: 96.4 fL (ref 80.0–100.0)
MCV: 96 fL (ref 80.0–100.0)
Platelets: 226 10*3/uL (ref 150–400)
Platelets: 158 10*3/uL (ref 150–400)
Platelets: 151 10*3/uL (ref 150–400)
RBC: 3.8 MIL/uL — ABNORMAL LOW (ref 4.22–5.81)
RBC: 3.57 MIL/uL — ABNORMAL LOW (ref 4.22–5.81)
RBC: 3.27 MIL/uL — ABNORMAL LOW (ref 4.22–5.81)
RDW: 13.4 % (ref 11.5–15.5)
RDW: 13.5 % (ref 11.5–15.5)
RDW: 13.7 % (ref 11.5–15.5)
WBC: 5.7 10*3/uL (ref 4.0–10.5)
WBC: 11 10*3/uL — ABNORMAL HIGH (ref 4.0–10.5)
WBC: 9.4 10*3/uL (ref 4.0–10.5)
nRBC: 0 % (ref 0.0–0.2)
nRBC: 0 % (ref 0.0–0.2)
nRBC: 0 % (ref 0.0–0.2)

## 2022-03-28 LAB — APTT: aPTT: 34 seconds (ref 24–36)

## 2022-03-28 LAB — BASIC METABOLIC PANEL
Anion gap: 10 (ref 5–15)
Anion gap: 7 (ref 5–15)
BUN: 17 mg/dL (ref 8–23)
BUN: 16 mg/dL (ref 8–23)
CO2: 24 mmol/L (ref 22–32)
CO2: 21 mmol/L — ABNORMAL LOW (ref 22–32)
Calcium: 8.5 mg/dL — ABNORMAL LOW (ref 8.9–10.3)
Calcium: 7.6 mg/dL — ABNORMAL LOW (ref 8.9–10.3)
Chloride: 103 mmol/L (ref 98–111)
Chloride: 110 mmol/L (ref 98–111)
Creatinine, Ser: 1.12 mg/dL (ref 0.61–1.24)
Creatinine, Ser: 1.08 mg/dL (ref 0.61–1.24)
GFR, Estimated: >60 mL/min (ref >60)
GFR, Estimated: >60 mL/min (ref >60)
Glucose, Bld: 122 mg/dL — ABNORMAL HIGH (ref 70–99)
Glucose, Bld: 146 mg/dL — ABNORMAL HIGH (ref 70–99)
Potassium: 4 mmol/L (ref 3.5–5.1)
Potassium: 5.5 mmol/L — ABNORMAL HIGH (ref 3.5–5.1)
Sodium: 137 mmol/L (ref 135–145)
Sodium: 138 mmol/L (ref 135–145)

## 2022-03-28 LAB — MAGNESIUM: Magnesium: 2.9 mg/dL — ABNORMAL HIGH (ref 1.7–2.4)

## 2022-03-28 LAB — GLUCOSE, CAPILLARY
Glucose-Capillary: 132 mg/dL — ABNORMAL HIGH (ref 70–99)
Glucose-Capillary: 109 mg/dL — ABNORMAL HIGH (ref 70–99)
Glucose-Capillary: 126 mg/dL — ABNORMAL HIGH (ref 70–99)
Glucose-Capillary: 101 mg/dL — ABNORMAL HIGH (ref 70–99)
Glucose-Capillary: 109 mg/dL — ABNORMAL HIGH (ref 70–99)
Glucose-Capillary: 110 mg/dL — ABNORMAL HIGH (ref 70–99)
Glucose-Capillary: 143 mg/dL — ABNORMAL HIGH (ref 70–99)
Glucose-Capillary: 134 mg/dL — ABNORMAL HIGH (ref 70–99)

## 2022-03-28 LAB — PROTIME-INR
INR: 1.3 — ABNORMAL HIGH (ref 0.8–1.2)
Prothrombin Time: 16.2 seconds — ABNORMAL HIGH (ref 11.4–15.2)

## 2022-03-28 LAB — PLATELET COUNT: Platelets: 157 10*3/uL (ref 150–400)

## 2022-03-28 LAB — HEMOGLOBIN AND HEMATOCRIT, BLOOD
HCT: 26.9 % — ABNORMAL LOW (ref 39.0–52.0)
Hemoglobin: 9.1 g/dL — ABNORMAL LOW (ref 13.0–17.0)

## 2022-03-28 LAB — PREPARE RBC (CROSSMATCH)

## 2022-03-28 LAB — HEPARIN LEVEL (UNFRACTIONATED): Heparin Unfractionated: 0.5 IU/mL (ref 0.30–0.70)

## 2022-03-28 SURGERY — REDO CORONARY ARTERY BYPASS GRAFTING (CABG)
Anesthesia: General | Site: Esophagus

## 2022-03-28 MED ORDER — CHLORHEXIDINE GLUCONATE 0.12 % MT SOLN
OROMUCOSAL | Status: AC
  Administered 2022-03-28: 15 mL via OROMUCOSAL
  Filled 2022-03-28: qty 15

## 2022-03-28 MED ORDER — OXYCODONE HCL 5 MG PO TABS
5.0000 mg | ORAL_TABLET | ORAL | Status: DC | PRN
  Administered 2022-03-28: 5 mg via ORAL
  Administered 2022-03-29 (×2): 10 mg via ORAL
  Administered 2022-03-29: 5 mg via ORAL
  Administered 2022-03-30 – 2022-04-01 (×2): 10 mg via ORAL
  Filled 2022-03-28 (×3): qty 2
  Filled 2022-03-28 (×2): qty 1
  Filled 2022-03-28: qty 2

## 2022-03-28 MED ORDER — SODIUM CHLORIDE 0.45 % IV SOLN: INTRAVENOUS | Status: DC | PRN

## 2022-03-28 MED ORDER — LACTATED RINGERS IV SOLN: INTRAVENOUS | Status: DC

## 2022-03-28 MED ORDER — BISACODYL 10 MG RE SUPP: 10.0000 mg | Freq: Every day | RECTAL | Status: DC

## 2022-03-28 MED ORDER — VANCOMYCIN HCL IN DEXTROSE 1-5 GM/200ML-% IV SOLN
1000.0000 mg | Freq: Once | INTRAVENOUS | Status: AC
  Administered 2022-03-28: 1000 mg via INTRAVENOUS
  Filled 2022-03-28: qty 200

## 2022-03-28 MED ORDER — ACETAMINOPHEN 650 MG RE SUPP
650.0000 mg | Freq: Once | RECTAL | Status: AC
  Administered 2022-03-28: 650 mg via RECTAL

## 2022-03-28 MED ORDER — FENTANYL CITRATE (PF) 250 MCG/5ML IJ SOLN
INTRAMUSCULAR | Status: AC
  Filled 2022-03-28: qty 5

## 2022-03-28 MED ORDER — HEPARIN SODIUM (PORCINE) 1000 UNIT/ML IJ SOLN
INTRAMUSCULAR | Status: AC
  Filled 2022-03-28: qty 1

## 2022-03-28 MED ORDER — SODIUM CHLORIDE 0.9% FLUSH
3.0000 mL | Freq: Two times a day (BID) | INTRAVENOUS | Status: DC
  Administered 2022-03-29 – 2022-03-31 (×5): 3 mL via INTRAVENOUS

## 2022-03-28 MED ORDER — CEFAZOLIN SODIUM-DEXTROSE 2-4 GM/100ML-% IV SOLN
2.0000 g | Freq: Three times a day (TID) | INTRAVENOUS | Status: AC
  Administered 2022-03-28 – 2022-03-30 (×6): 2 g via INTRAVENOUS
  Filled 2022-03-28 (×6): qty 100

## 2022-03-28 MED ORDER — SODIUM CHLORIDE 0.9 % IV SOLN: INTRAVENOUS | Status: DC

## 2022-03-28 MED ORDER — METOPROLOL TARTRATE 25 MG/10 ML ORAL SUSPENSION: 12.5000 mg | Freq: Two times a day (BID) | ORAL | Status: DC

## 2022-03-28 MED ORDER — ALBUMIN HUMAN 5 % IV SOLN: INTRAVENOUS | Status: DC | PRN

## 2022-03-28 MED ORDER — DEXAMETHASONE SODIUM PHOSPHATE 10 MG/ML IJ SOLN
INTRAMUSCULAR | Status: DC | PRN
  Administered 2022-03-28: 5 mg via INTRAVENOUS

## 2022-03-28 MED ORDER — ONDANSETRON HCL 4 MG/2ML IJ SOLN
4.0000 mg | Freq: Four times a day (QID) | INTRAMUSCULAR | Status: DC | PRN
Start: 2022-03-28 — End: 2022-04-01
  Administered 2022-03-30: 4 mg via INTRAVENOUS
  Filled 2022-03-28: qty 2

## 2022-03-28 MED ORDER — CHLORHEXIDINE GLUCONATE 0.12 % MT SOLN
15.0000 mL | OROMUCOSAL | Status: AC
  Administered 2022-03-28: 15 mL via OROMUCOSAL

## 2022-03-28 MED ORDER — LACTATED RINGERS IV SOLN: INTRAVENOUS | Status: DC | PRN

## 2022-03-28 MED ORDER — PROPOFOL 10 MG/ML IV BOLUS
INTRAVENOUS | Status: AC
  Filled 2022-03-28: qty 20

## 2022-03-28 MED ORDER — NITROGLYCERIN IN D5W 200-5 MCG/ML-% IV SOLN: 0.0000 ug/min | INTRAVENOUS | Status: DC

## 2022-03-28 MED ORDER — ACETAMINOPHEN 160 MG/5ML PO SOLN: 650.0000 mg | Freq: Once | ORAL | Status: AC

## 2022-03-28 MED ORDER — DOCUSATE SODIUM 100 MG PO CAPS
200.0000 mg | ORAL_CAPSULE | Freq: Every day | ORAL | Status: DC
  Administered 2022-03-29 – 2022-04-01 (×4): 200 mg via ORAL
  Filled 2022-03-28 (×4): qty 2

## 2022-03-28 MED ORDER — SODIUM CHLORIDE 0.9 % IR SOLN
Status: DC | PRN
  Administered 2022-03-28: 5000 mL

## 2022-03-28 MED ORDER — MORPHINE SULFATE (PF) 2 MG/ML IV SOLN
1.0000 mg | INTRAVENOUS | Status: DC | PRN
  Administered 2022-03-28 – 2022-03-29 (×4): 2 mg via INTRAVENOUS
  Administered 2022-03-29: 4 mg via INTRAVENOUS
  Administered 2022-03-29 (×2): 2 mg via INTRAVENOUS
  Administered 2022-03-29: 4 mg via INTRAVENOUS
  Administered 2022-03-29: 2 mg via INTRAVENOUS
  Administered 2022-03-29 (×2): 4 mg via INTRAVENOUS
  Administered 2022-03-30: 2 mg via INTRAVENOUS
  Filled 2022-03-28 (×4): qty 1
  Filled 2022-03-28: qty 2
  Filled 2022-03-28 (×2): qty 1
  Filled 2022-03-28 (×2): qty 2
  Filled 2022-03-28: qty 1
  Filled 2022-03-28: qty 2
  Filled 2022-03-28: qty 1

## 2022-03-28 MED ORDER — ORAL CARE MOUTH RINSE: 15.0000 mL | Freq: Once | OROMUCOSAL | Status: AC

## 2022-03-28 MED ORDER — ASPIRIN 325 MG PO TBEC
325.0000 mg | DELAYED_RELEASE_TABLET | Freq: Every day | ORAL | Status: DC
  Administered 2022-03-29 – 2022-04-01 (×4): 325 mg via ORAL
  Filled 2022-03-28 (×4): qty 1

## 2022-03-28 MED ORDER — SODIUM CHLORIDE 0.9 % IV SOLN: 250.0000 mL | INTRAVENOUS | Status: DC

## 2022-03-28 MED ORDER — PROTAMINE SULFATE 10 MG/ML IV SOLN
INTRAVENOUS | Status: AC
  Filled 2022-03-28: qty 25

## 2022-03-28 MED ORDER — INSULIN ASPART 100 UNIT/ML IJ SOLN: 0.0000 [IU] | INTRAMUSCULAR | Status: DC | PRN

## 2022-03-28 MED ORDER — ROCURONIUM BROMIDE 10 MG/ML (PF) SYRINGE
PREFILLED_SYRINGE | INTRAVENOUS | Status: AC
  Filled 2022-03-28: qty 20

## 2022-03-28 MED ORDER — MAGNESIUM SULFATE 4 GM/100ML IV SOLN
4.0000 g | Freq: Once | INTRAVENOUS | Status: AC
  Administered 2022-03-28: 4 g via INTRAVENOUS
  Filled 2022-03-28: qty 100

## 2022-03-28 MED ORDER — FAMOTIDINE IN NACL 20-0.9 MG/50ML-% IV SOLN
20.0000 mg | Freq: Two times a day (BID) | INTRAVENOUS | Status: AC
  Administered 2022-03-28 – 2022-03-29 (×2): 20 mg via INTRAVENOUS
  Filled 2022-03-28 (×2): qty 50

## 2022-03-28 MED ORDER — LEVOTHYROXINE SODIUM 100 MCG PO TABS
100.0000 ug | ORAL_TABLET | Freq: Every day | ORAL | Status: DC
Start: 2022-03-29 — End: 2022-04-01
  Administered 2022-03-29 – 2022-04-01 (×4): 100 ug via ORAL
  Filled 2022-03-28 (×5): qty 1

## 2022-03-28 MED ORDER — ROCURONIUM BROMIDE 10 MG/ML (PF) SYRINGE
PREFILLED_SYRINGE | INTRAVENOUS | Status: DC | PRN
  Administered 2022-03-28: 20 mg via INTRAVENOUS
  Administered 2022-03-28: 50 mg via INTRAVENOUS
  Administered 2022-03-28: 100 mg via INTRAVENOUS
  Administered 2022-03-28: 80 mg via INTRAVENOUS

## 2022-03-28 MED ORDER — PLASMA-LYTE A IV SOLN: INTRAVENOUS | Status: DC | PRN

## 2022-03-28 MED ORDER — TRAMADOL HCL 50 MG PO TABS
50.0000 mg | ORAL_TABLET | ORAL | Status: DC | PRN
  Administered 2022-03-29 – 2022-03-30 (×2): 50 mg via ORAL
  Filled 2022-03-28: qty 2
  Filled 2022-03-28: qty 1

## 2022-03-28 MED ORDER — SODIUM BICARBONATE 8.4 % IV SOLN
50.0000 meq | Freq: Once | INTRAVENOUS | Status: AC
  Administered 2022-03-28: 50 meq via INTRAVENOUS

## 2022-03-28 MED ORDER — INSULIN REGULAR(HUMAN) IN NACL 100-0.9 UT/100ML-% IV SOLN: INTRAVENOUS | Status: DC

## 2022-03-28 MED ORDER — METOPROLOL TARTRATE 12.5 MG HALF TABLET
12.5000 mg | ORAL_TABLET | Freq: Two times a day (BID) | ORAL | Status: DC
Start: 2022-03-28 — End: 2022-03-29

## 2022-03-28 MED ORDER — PANTOPRAZOLE SODIUM 40 MG PO TBEC
40.0000 mg | DELAYED_RELEASE_TABLET | Freq: Every day | ORAL | Status: DC
  Administered 2022-03-30 – 2022-04-01 (×3): 40 mg via ORAL
  Filled 2022-03-28 (×3): qty 1

## 2022-03-28 MED ORDER — CHLORHEXIDINE GLUCONATE 0.12 % MT SOLN
15.0000 mL | Freq: Once | OROMUCOSAL | Status: AC
  Filled 2022-03-28: qty 15

## 2022-03-28 MED ORDER — MIDAZOLAM HCL (PF) 10 MG/2ML IJ SOLN
INTRAMUSCULAR | Status: AC
  Filled 2022-03-28: qty 2

## 2022-03-28 MED ORDER — DEXAMETHASONE SODIUM PHOSPHATE 10 MG/ML IJ SOLN
INTRAMUSCULAR | Status: AC
  Filled 2022-03-28: qty 1

## 2022-03-28 MED ORDER — CHLORHEXIDINE GLUCONATE CLOTH 2 % EX PADS
6.0000 | MEDICATED_PAD | Freq: Every day | CUTANEOUS | Status: DC
  Administered 2022-03-28 – 2022-03-31 (×5): 6 via TOPICAL

## 2022-03-28 MED ORDER — DEXMEDETOMIDINE HCL IN NACL 400 MCG/100ML IV SOLN
0.0000 ug/kg/h | INTRAVENOUS | Status: DC
  Administered 2022-03-28: 0.7 ug/kg/h via INTRAVENOUS

## 2022-03-28 MED ORDER — MIDAZOLAM HCL (PF) 5 MG/ML IJ SOLN
INTRAMUSCULAR | Status: DC | PRN
  Administered 2022-03-28: 6 mg via INTRAVENOUS
  Administered 2022-03-28 (×2): 1 mg via INTRAVENOUS
  Administered 2022-03-28: 2 mg via INTRAVENOUS

## 2022-03-28 MED ORDER — FENTANYL CITRATE (PF) 250 MCG/5ML IJ SOLN
INTRAMUSCULAR | Status: DC | PRN
  Administered 2022-03-28: 150 ug via INTRAVENOUS
  Administered 2022-03-28 (×3): 100 ug via INTRAVENOUS
  Administered 2022-03-28: 50 ug via INTRAVENOUS
  Administered 2022-03-28: 150 ug via INTRAVENOUS
  Administered 2022-03-28: 700 ug via INTRAVENOUS

## 2022-03-28 MED ORDER — PHENYLEPHRINE 80 MCG/ML (10ML) SYRINGE FOR IV PUSH (FOR BLOOD PRESSURE SUPPORT)
PREFILLED_SYRINGE | INTRAVENOUS | Status: AC
  Filled 2022-03-28: qty 10

## 2022-03-28 MED ORDER — ALBUMIN HUMAN 5 % IV SOLN
250.0000 mL | INTRAVENOUS | Status: AC | PRN
  Administered 2022-03-28 – 2022-03-29 (×4): 12.5 g via INTRAVENOUS
  Filled 2022-03-28 (×2): qty 250

## 2022-03-28 MED ORDER — BISACODYL 5 MG PO TBEC
10.0000 mg | DELAYED_RELEASE_TABLET | Freq: Every day | ORAL | Status: DC
  Administered 2022-03-29 – 2022-04-01 (×3): 10 mg via ORAL
  Filled 2022-03-28 (×3): qty 2

## 2022-03-28 MED ORDER — PHENYLEPHRINE HCL-NACL 20-0.9 MG/250ML-% IV SOLN
0.0000 ug/min | INTRAVENOUS | Status: DC
  Administered 2022-03-29: 10 ug/min via INTRAVENOUS
  Filled 2022-03-28: qty 250

## 2022-03-28 MED ORDER — MIDAZOLAM HCL 2 MG/2ML IJ SOLN: 2.0000 mg | INTRAMUSCULAR | Status: DC | PRN

## 2022-03-28 MED ORDER — ACETAMINOPHEN 160 MG/5ML PO SOLN: 1000.0000 mg | Freq: Four times a day (QID) | ORAL | Status: DC

## 2022-03-28 MED ORDER — ROCURONIUM BROMIDE 10 MG/ML (PF) SYRINGE
PREFILLED_SYRINGE | INTRAVENOUS | Status: AC
  Filled 2022-03-28: qty 10

## 2022-03-28 MED ORDER — ASPIRIN 81 MG PO CHEW
324.0000 mg | CHEWABLE_TABLET | Freq: Every day | ORAL | Status: DC
  Filled 2022-03-28: qty 4

## 2022-03-28 MED ORDER — SODIUM CHLORIDE 0.9% FLUSH: 3.0000 mL | INTRAVENOUS | Status: DC | PRN

## 2022-03-28 MED ORDER — PROPOFOL 10 MG/ML IV BOLUS
INTRAVENOUS | Status: DC | PRN
  Administered 2022-03-28: 30 mg via INTRAVENOUS
  Administered 2022-03-28: 40 mg via INTRAVENOUS
  Administered 2022-03-28 (×3): 20 mg via INTRAVENOUS

## 2022-03-28 MED ORDER — EZETIMIBE 10 MG PO TABS: 10.0000 mg | ORAL_TABLET | Freq: Every day | ORAL | Status: DC

## 2022-03-28 MED ORDER — SODIUM CHLORIDE 0.9 % IV SOLN: INTRAVENOUS | Status: DC | PRN

## 2022-03-28 MED ORDER — DEXTROSE 50 % IV SOLN: 0.0000 mL | INTRAVENOUS | Status: DC | PRN

## 2022-03-28 MED ORDER — PROTAMINE SULFATE 10 MG/ML IV SOLN
INTRAVENOUS | Status: DC | PRN
  Administered 2022-03-28: 280 mg via INTRAVENOUS

## 2022-03-28 MED ORDER — POTASSIUM CHLORIDE 10 MEQ/50ML IV SOLN
10.0000 meq | INTRAVENOUS | Status: AC
  Administered 2022-03-28 (×3): 10 meq via INTRAVENOUS

## 2022-03-28 MED ORDER — PROTAMINE SULFATE 10 MG/ML IV SOLN
INTRAVENOUS | Status: AC
Start: 2022-03-28 — End: ?
  Filled 2022-03-28: qty 5

## 2022-03-28 MED ORDER — ONDANSETRON HCL 4 MG/2ML IJ SOLN
INTRAMUSCULAR | Status: DC | PRN
  Administered 2022-03-28: 4 mg via INTRAVENOUS

## 2022-03-28 MED ORDER — ACETAMINOPHEN 500 MG PO TABS
1000.0000 mg | ORAL_TABLET | Freq: Four times a day (QID) | ORAL | Status: DC
  Administered 2022-03-28 – 2022-04-01 (×15): 1000 mg via ORAL
  Filled 2022-03-28 (×16): qty 2

## 2022-03-28 MED ORDER — HEPARIN SODIUM (PORCINE) 1000 UNIT/ML IJ SOLN
INTRAMUSCULAR | Status: DC | PRN
  Administered 2022-03-28: 28000 [IU] via INTRAVENOUS

## 2022-03-28 MED ORDER — ONDANSETRON HCL 4 MG/2ML IJ SOLN
INTRAMUSCULAR | Status: AC
  Filled 2022-03-28: qty 2

## 2022-03-28 MED ORDER — HEMOSTATIC AGENTS (NO CHARGE) OPTIME
TOPICAL | Status: DC | PRN
  Administered 2022-03-28 (×2): 1 via TOPICAL

## 2022-03-28 MED ORDER — METOPROLOL TARTRATE 5 MG/5ML IV SOLN
2.5000 mg | INTRAVENOUS | Status: DC | PRN
  Administered 2022-03-29: 5 mg via INTRAVENOUS
  Administered 2022-03-29 (×2): 2.5 mg via INTRAVENOUS
  Filled 2022-03-28 (×3): qty 5

## 2022-03-28 MED ORDER — PHENYLEPHRINE 80 MCG/ML (10ML) SYRINGE FOR IV PUSH (FOR BLOOD PRESSURE SUPPORT)
PREFILLED_SYRINGE | INTRAVENOUS | Status: DC | PRN
  Administered 2022-03-28 (×2): 80 ug via INTRAVENOUS

## 2022-03-28 MED ORDER — SODIUM CHLORIDE (PF) 0.9 % IJ SOLN
OROMUCOSAL | Status: DC | PRN
  Administered 2022-03-28: 16 mL via TOPICAL
  Administered 2022-03-28: 4 mL via TOPICAL

## 2022-03-28 MED ORDER — LACTATED RINGERS IV SOLN: 500.0000 mL | Freq: Once | INTRAVENOUS | Status: DC | PRN

## 2022-03-28 SURGICAL SUPPLY — 121 items
ADAPTER CARDIO PERF ANTE/RETRO (ADAPTER) ×1 IMPLANT
APPLIER CLIP 9.375 MED OPEN (MISCELLANEOUS)
APPLIER CLIP 9.375 SM OPEN (CLIP)
BAG DECANTER FOR FLEXI CONT (MISCELLANEOUS) ×3 IMPLANT
BLADE CLIPPER SURG (BLADE) ×3 IMPLANT
BLADE CORE FAN STRYKER (BLADE) ×5 IMPLANT
BLADE SAW SAG 29X58X.64 (BLADE) IMPLANT
BLADE STERNUM SYSTEM 6 (BLADE) ×3 IMPLANT
BNDG ELASTIC 4X5.8 VLCR STR LF (GAUZE/BANDAGES/DRESSINGS) ×4 IMPLANT
BNDG ELASTIC 6X5.8 VLCR STR LF (GAUZE/BANDAGES/DRESSINGS) ×3 IMPLANT
BNDG GAUZE DERMACEA FLUFF (GAUZE/BANDAGES/DRESSINGS) ×2
BNDG GAUZE DERMACEA FLUFF 4 (GAUZE/BANDAGES/DRESSINGS) IMPLANT
BNDG GAUZE ELAST 4 BULKY (GAUZE/BANDAGES/DRESSINGS) ×2 IMPLANT
CANISTER SUCT 3000ML PPV (MISCELLANEOUS) ×3 IMPLANT
CANNULA ARTERIAL VENT 3/8 20FR (CANNULA) IMPLANT
CANNULA EZ GLIDE AORTIC 21FR (CANNULA) ×1 IMPLANT
CANNULA GUNDRY RCSP 15FR (MISCELLANEOUS) IMPLANT
CANNULA GUNDRY RETROGRADE 15FR (MISCELLANEOUS) ×1 IMPLANT
CATH CPB KIT HENDRICKSON (MISCELLANEOUS) ×1 IMPLANT
CATH ROBINSON RED A/P 18FR (CATHETERS) ×4 IMPLANT
CATH THORACIC 28FR (CATHETERS) ×1 IMPLANT
CATH THORACIC 36FR (CATHETERS) ×1 IMPLANT
CATH THORACIC 36FR RT ANG (CATHETERS) ×1 IMPLANT
CLIP APPLIE 9.375 MED OPEN (MISCELLANEOUS) IMPLANT
CLIP APPLIE 9.375 SM OPEN (CLIP) IMPLANT
CLIP FOGARTY SPRING 6M (CLIP) IMPLANT
CLIP TI WIDE RED SMALL 24 (CLIP) ×1 IMPLANT
CLIP VESOCCLUDE MED 24/CT (CLIP) IMPLANT
CLIP VESOCCLUDE SM WIDE 24/CT (CLIP) IMPLANT
CONN Y 3/8X3/8X3/8  BEN (MISCELLANEOUS)
CONN Y 3/8X3/8X3/8 BEN (MISCELLANEOUS) IMPLANT
CONTAINER PROTECT SURGISLUSH (MISCELLANEOUS) ×4 IMPLANT
DERMABOND ADVANCED (GAUZE/BANDAGES/DRESSINGS) ×1
DERMABOND ADVANCED .7 DNX12 (GAUZE/BANDAGES/DRESSINGS) IMPLANT
DRAPE CARDIOVASCULAR INCISE (DRAPES) ×1
DRAPE EXTREMITY T 121X128X90 (DISPOSABLE) ×1 IMPLANT
DRAPE HALF SHEET 40X57 (DRAPES) ×1 IMPLANT
DRAPE SRG 135X102X78XABS (DRAPES) ×2 IMPLANT
DRAPE WARM FLUID 44X44 (DRAPES) IMPLANT
DRSG COVADERM 4X14 (GAUZE/BANDAGES/DRESSINGS) ×3 IMPLANT
ELECT REM PT RETURN 9FT ADLT (ELECTROSURGICAL) ×6
ELECTRODE REM PT RTRN 9FT ADLT (ELECTROSURGICAL) ×4 IMPLANT
FELT TEFLON 1X6 (MISCELLANEOUS) ×3 IMPLANT
GAUZE 4X4 16PLY ~~LOC~~+RFID DBL (SPONGE) ×3 IMPLANT
GAUZE SPONGE 4X4 12PLY STRL (GAUZE/BANDAGES/DRESSINGS) ×7 IMPLANT
GAUZE SPONGE 4X4 12PLY STRL LF (GAUZE/BANDAGES/DRESSINGS) ×3 IMPLANT
GLOVE BIO SURGEON STRL SZ 6.5 (GLOVE) ×2 IMPLANT
GLOVE BIOGEL PI IND STRL 7.0 (GLOVE) IMPLANT
GLOVE BIOGEL PI INDICATOR 7.0 (GLOVE) ×2
GLOVE SS BIOGEL STRL SZ 6 (GLOVE) IMPLANT
GLOVE SS BIOGEL STRL SZ 6.5 (GLOVE) IMPLANT
GLOVE SUPERSENSE BIOGEL SZ 6 (GLOVE) ×1
GLOVE SUPERSENSE BIOGEL SZ 6.5 (GLOVE) ×1
GLOVE SURG SIGNA 7.5 PF LTX (GLOVE) IMPLANT
GLOVE SURG SS PI 7.5 STRL IVOR (GLOVE) ×1 IMPLANT
GLOVE SURG SS PI 8.0 STRL IVOR (GLOVE) ×1 IMPLANT
GOWN STRL REUS W/ TWL LRG LVL3 (GOWN DISPOSABLE) ×8 IMPLANT
GOWN STRL REUS W/ TWL XL LVL3 (GOWN DISPOSABLE) ×4 IMPLANT
GOWN STRL REUS W/TWL LRG LVL3 (GOWN DISPOSABLE) ×5
GOWN STRL REUS W/TWL XL LVL3 (GOWN DISPOSABLE) ×2
HEMOSTAT POWDER SURGIFOAM 1G (HEMOSTASIS) ×5 IMPLANT
HEMOSTAT SURGICEL 2X14 (HEMOSTASIS) ×1 IMPLANT
INSERT FOGARTY XLG (MISCELLANEOUS) IMPLANT
KIT BASIN OR (CUSTOM PROCEDURE TRAY) ×3 IMPLANT
KIT SUCTION CATH 14FR (SUCTIONS) IMPLANT
KIT TURNOVER KIT B (KITS) ×3 IMPLANT
KIT VASOVIEW HEMOPRO 2 VH 4000 (KITS) ×3 IMPLANT
MARKER GRAFT CORONARY BYPASS (MISCELLANEOUS) ×3 IMPLANT
NS IRRIG 1000ML POUR BTL (IV SOLUTION) ×13 IMPLANT
PACK E OPEN HEART (SUTURE) ×3 IMPLANT
PACK OPEN HEART (CUSTOM PROCEDURE TRAY) ×3 IMPLANT
PAD ARMBOARD 7.5X6 YLW CONV (MISCELLANEOUS) ×6 IMPLANT
PAD DEFIB R2 (MISCELLANEOUS) IMPLANT
PAD ELECT DEFIB RADIOL ZOLL (MISCELLANEOUS) ×3 IMPLANT
PENCIL BUTTON HOLSTER BLD 10FT (ELECTRODE) IMPLANT
POSITIONER HEAD DONUT 9IN (MISCELLANEOUS) ×3 IMPLANT
PUNCH AORTIC ROTATE 4.0MM (MISCELLANEOUS) ×1 IMPLANT
PUNCH AORTIC ROTATE 4.5MM 8IN (MISCELLANEOUS) IMPLANT
PUNCH AORTIC ROTATE 5MM 8IN (MISCELLANEOUS) IMPLANT
SEALANT PATCH FIBRIN 2X4IN (MISCELLANEOUS) ×1 IMPLANT
SEALANT PROGEL (MISCELLANEOUS) ×1 IMPLANT
SET MPS 3-ND DEL (MISCELLANEOUS) ×1 IMPLANT
SHEARS HARMONIC 9CM CVD (BLADE) ×1 IMPLANT
SOL ANTI FOG 6CC (MISCELLANEOUS) ×2 IMPLANT
SOLUTION ANTI FOG 6CC (MISCELLANEOUS) ×1
SPONGE T-LAP 18X18 ~~LOC~~+RFID (SPONGE) ×12 IMPLANT
SPONGE T-LAP 4X18 ~~LOC~~+RFID (SPONGE) ×3 IMPLANT
SUT ETHIBOND NAB MH 2-0 36IN (SUTURE) ×2 IMPLANT
SUT MNCRL AB 4-0 PS2 18 (SUTURE) ×1 IMPLANT
SUT PROLENE 3 0 SH DA (SUTURE) IMPLANT
SUT PROLENE 3 0 SH1 36 (SUTURE) ×1 IMPLANT
SUT PROLENE 4 0 RB 1 (SUTURE) ×1
SUT PROLENE 4 0 SH DA (SUTURE) ×3 IMPLANT
SUT PROLENE 4-0 RB1 .5 CRCL 36 (SUTURE) IMPLANT
SUT PROLENE 5 0 C 1 36 (SUTURE) ×2 IMPLANT
SUT PROLENE 6 0 C 1 30 (SUTURE) ×9 IMPLANT
SUT PROLENE 7 0 BV 1 (SUTURE) ×4 IMPLANT
SUT PROLENE 8 0 BV175 6 (SUTURE) ×6 IMPLANT
SUT SILK  1 MH (SUTURE)
SUT SILK 1 MH (SUTURE) IMPLANT
SUT SILK 1 TIES 10X30 (SUTURE) ×3 IMPLANT
SUT STEEL 6MS V (SUTURE) IMPLANT
SUT STEEL STERNAL CCS#1 18IN (SUTURE) ×1 IMPLANT
SUT STEEL SZ 6 DBL 3X14 BALL (SUTURE) ×1 IMPLANT
SUT TEM PAC WIRE 2 0 SH (SUTURE) IMPLANT
SUT VIC AB 1 CTX 36 (SUTURE)
SUT VIC AB 1 CTX36XBRD ANBCTR (SUTURE) IMPLANT
SUT VIC AB 2-0 CT1 27 (SUTURE) ×2
SUT VIC AB 2-0 CT1 TAPERPNT 27 (SUTURE) IMPLANT
SUT VIC AB 2-0 CTX 27 (SUTURE) IMPLANT
SUT VIC AB 3-0 X1 27 (SUTURE) ×1 IMPLANT
SYSTEM SAHARA CHEST DRAIN ATS (WOUND CARE) ×3 IMPLANT
TAPE CLOTH 4X10 WHT NS (GAUZE/BANDAGES/DRESSINGS) ×1 IMPLANT
TAPE PAPER 2X10 WHT MICROPORE (GAUZE/BANDAGES/DRESSINGS) ×1 IMPLANT
TIP APPLICATOR SPRAY EXTEND 16 (VASCULAR PRODUCTS) ×1 IMPLANT
TOWEL GREEN STERILE (TOWEL DISPOSABLE) ×3 IMPLANT
TOWEL GREEN STERILE FF (TOWEL DISPOSABLE) ×3 IMPLANT
TRAY FOLEY SLVR 16FR TEMP STAT (SET/KITS/TRAYS/PACK) ×3 IMPLANT
TUBING LAP HI FLOW INSUFFLATIO (TUBING) ×3 IMPLANT
UNDERPAD 30X36 HEAVY ABSORB (UNDERPADS AND DIAPERS) ×3 IMPLANT
WATER STERILE IRR 1000ML POUR (IV SOLUTION) ×6 IMPLANT

## 2022-03-28 NOTE — Anesthesia Procedure Notes (Signed)
Central Venous Catheter Insertion Performed by: Annye Asa, MD, anesthesiologist Start/End6/22/2023 8:28 AM, 03/28/2022 8:44 AM Patient location: Pre-op. Preanesthetic checklist: patient identified, IV checked, site marked, risks and benefits discussed, surgical consent, monitors and equipment checked, pre-op evaluation, timeout performed and anesthesia consent Position: supine Lidocaine 1% used for infiltration and patient sedated Hand hygiene performed , maximum sterile barriers used  and Seldinger technique used Catheter size: 8 Fr Central line was placed.MAC introducer Swan type:thermodilution Procedure performed using ultrasound guided technique. Ultrasound Notes:anatomy identified, needle tip was noted to be adjacent to the nerve/plexus identified, no ultrasound evidence of intravascular and/or intraneural injection and image(s) printed for medical record Attempts: 1 Following insertion, line sutured, dressing applied and Biopatch. Post procedure assessment: blood return through all ports, free fluid flow and no air  Patient tolerated the procedure well with no immediate complications. Additional procedure comments: PA catheter:  Routine monitors. Timeout, sterile prep, drape, FBP R neck. Supine position.  1% Lido local, finder and trocar RIJ 1st pass with US guidance.  MAC introducer placed over J wire. PA catheter in easily.  Sterile dressing applied.  Patient tolerated well, VSS.  Jenita Seashore, MD .

## 2022-03-28 NOTE — Procedures (Signed)
Extubation Procedure Note  Patient Details:   Name: Ryan Herrera DOB: [birth year] MRN: [id number]   Airway Documentation:    Vent end date: 03/28/22 Vent end time: 2049   Evaluation  O2 sats: stable throughout Complications: No apparent complications Patient did tolerate procedure well. Bilateral Breath Sounds: Clear, Diminished   Yes  Patient was able to perform VC 760 mL; NIF -40 and positive cuff leak.  Patient was extubated to 4 L [place] and performed IS 500 mL  Caudle Clamp A 03/28/2022, 9:04 PM

## 2022-03-28 NOTE — Anesthesia Procedure Notes (Signed)
Procedure Name: Intubation Date/Time: 03/28/2022 9:42 AM  Performed by: Janace Litten, CRNAPre-anesthesia Checklist: Patient identified, Emergency Drugs available, Suction available and Patient being monitored Patient Re-evaluated:Patient Re-evaluated prior to induction Oxygen Delivery Method: Circle System Utilized Preoxygenation: Pre-oxygenation with 100% oxygen Induction Type: IV induction Ventilation: Mask ventilation without difficulty Laryngoscope Size: Mac and 4 Grade View: Grade II Tube type: Oral Tube size: 8.0 mm Number of attempts: 1 Airway Equipment and Method: Stylet and Oral airway Placement Confirmation: ETT inserted through vocal cords under direct vision, positive ETCO2 and breath sounds checked- equal and bilateral Secured at: 23 cm Tube secured with: Tape Dental Injury: Teeth and Oropharynx as per pre-operative assessment

## 2022-03-28 NOTE — Brief Op Note (Addendum)
03/22/2022 - 03/28/2022  1:51 PM  PATIENT:  Ryan Herrera  76 y.o. male  PRE-OPERATIVE DIAGNOSIS:  CORONARY ARTERY DISEASE  POST-OPERATIVE DIAGNOSIS:  CORONARY ARTERY DISEASE  PROCEDURE:   REDO STERNOTOMY CORONARY ARTERY BYPASS GRAFTING (CABG) X 2 USING OPEN LEFT RADIAL ARTERY AND ENDOSCOPIC RIGHT GREATER SAPHENOUS VEIN HARVEST  Left Radial Artery->Ramus Intermediate SVG-> PDA REPAIR OF LIMA INJURY WITH END TO END ANASTOMOSIS  Vein harvest time: 37mn Vein prep time: 257m  TRANSESOPHAGEAL ECHOCARDIOGRAM   SURGEON:  HeMelrose NakayamaMD   PHYSICIAN ASSISTANT: Roddenberry  ASSISTANTS: WaDineen KidRN, RN First Assistant   ANESTHESIA:   general  EBL:  200  BLOOD ADMINISTERED:none  DRAINS:  Mediastinal Blake Drain    LOCAL MEDICATIONS USED:  NONE  SPECIMEN:  No Specimen  DISPOSITION OF SPECIMEN:  N/A  COUNTS:  Correct  DICTATION: .Dragon Dictation  PLAN OF CARE: Admit to inpatient   PATIENT DISPOSITION:  ICU - intubated and hemodynamically stable.   Delay start of Pharmacological VTE agent (>24hrs) due to surgical blood loss or risk of bleeding: yes

## 2022-03-28 NOTE — Procedures (Incomplete)
Extubation Procedure Note  Patient Details:   Name: Ryan Herrera DOB: 05-17-1946 MRN: [id number]   Airway Documentation:    Vent end date: 03/28/22 Vent end time: 2049   Evaluation  O2 sats: {O2 LDKC:4619012} Complications: {QUIVHOYWVXUCJ:6701100} Patient {did/not:14019} tolerate procedure well. Bilateral Breath Sounds: Clear, Diminished   {CHL IP RT ABILITY TO PEJYL:1643539}  Nicklas Clamp A 03/28/2022, 9:03 PM

## 2022-03-28 NOTE — Op Note (Signed)
NAME: Ryan Herrera, Ryan Herrera. MEDICAL RECORD NO: [id number] ACCOUNT NO: [id number] DATE OF BIRTH: [birth year] FACILITY: [facility] LOCATION: [facility] PHYSICIAN: Salvatore Decent. Dorris Fetch, MD  Operative Report   DATE OF PROCEDURE: 03/28/2022  PREOPERATIVE DIAGNOSES:  Severe native and graft 2-vessel coronary artery disease, status post non-ST elevation myocardial infarction.  POSTOPERATIVE DIAGNOSES:  Severe native and graft 2-vessel coronary artery disease, status post non-ST elevation myocardial infarction.  PROCEDURE:   Redo median sternotomy,  Redo coronary artery bypass grafting x2  Left radial artery to ramus intermedius and  Saphenous vein graft to posterior descending, Repair of left internal mammary artery injury with end-to-end anastomosis.   Endoscopic vein harvest right leg and open left radial artery harvest.  CLINICAL NOTE:  Ryan Herrera is a 76 year old gentleman with a history of coronary artery disease with previous coronary artery bypass grafting at [facility] many years ago.  He presented with accelerating angina and ruled in for non-ST elevation MI.  At  catheterization, he had severe native 3-vessel disease.  He had a patent left mammary to his LAD.  His vein grafts were occluded.  He was offered the option of redo coronary artery bypass grafting.  The indications, risks, benefits, and alternatives were  discussed in detail with the patient.  He accepted the risks and agreed to proceed.  He understood that the vessels grafted would be dependent on intraoperative findings.  OPERATIVE NOTE:  Ryan Herrera was brought to the preoperative holding area on 03/28/2022.  Anesthesia placed a Swan-Ganz catheter and an arterial blood pressure monitoring line.  He was taken to the operating room and anesthetized and intubated.  Foley  catheter was placed.  Intravenous antibiotics were administered.  Transesophageal echocardiography was performed.  It showed a preserved left ventricular systolic  function with no significant valvular pathology.  The chest, abdomen, legs and left arm were prepped and draped in the usual sterile fashion.  A timeout was performed.  Incision was made over the volar aspect of the left wrist.  A short segment of the left radial artery was dissected out.  There was a  preserved pulse distally and good Doppler signal in the palmar arch with proximal occlusion.  The incision then was extended to just below the antecubital fossa and the left radial was harvested using the Harmonic scalpel. Incision was made through the  patient's previous sternotomy scar and was carried through the skin and subcutaneous tissue.  The wires were dissected out, cut and removed.  Redo sternotomy was performed with an oscillating saw, the anterior table was divided first before dividing the  posterior table along the length of the sternum.  Each side of the sternum then was elevated and the underlying tissue was dissected off the edge of the sternum to allow placement of the retractor.  The pericardium had not been closed and the right  ventricle was exposed.  Dissection was slow and tedious and there were extensive adhesions throughout the pericardial space.  In some areas these adhesions were quite dense.  A plane was developed along the diaphragm.  The adhesions were less dense in that area and then a portion of the right atrium was dissected out working superiorly from there.  The dissection was carried to the aorta and the aorta was dissected out.  The pericardial edge was identified on the left side.  On the right side, the  pericardium was densely adherent to the aorta and was left in place.  The old cannulation site was dissected out and enough  of the right atrium was dissected out to allow for cannulation.  At this point, the radial incision had been closed and the arm  was tucked back to the patient's side.  The greater saphenous vein was harvested from the right thigh endoscopically.   The patient was fully heparinized.  After confirming adequate anticoagulation with ACT measurement, the aorta was cannulated via  concentric 2-0 Ethibond pledgeted pursestring sutures just distal to the prior cannulation site.  A dual stage venous cannula was placed via a pursestring suture in the right atrium.  Cardiopulmonary bypass was initiated.  Some additional dissection was  carried out along the right atrium and along the diaphragm.  The PA was dissected out superiorly. There were dense adhesions of the anterior wall to the sternum.  This was very slow and tedious dissection.  A retrograde cardioplegia cannula was placed  via a pursestring suture in the right atrium and directed into the coronary sinus.  An antegrade cardioplegia cannula was placed in the ascending aorta. While dissecting out the left mammary artery and attempting to dissect further posteriorly to expose  the OM. The mammary artery was cut near its insertion to the LAD.  The bulldog was placed across this area and the aortic crossclamp was applied.  The left ventricle was emptied via the aortic root vent.  Cardiac arrest then was achieved with a  combination of cold antegrade and retrograde blood cardioplegia and topical ice saline.  Initial cardioplegia was given antegrade.  There was a rapid diastolic arrest.  The remainder of the initial dose of cardioplegia was administered retrograde and  there was septal cooling to 10 degrees Celsius.  The mammary was inspected.  There was no way to primarily repair the tear in the mammary  It was felt the best option was to go ahead and transect the mammary at the site where it was injured.  There was sufficient length of the  mammary and it was dissected away from adhesions.  There was sufficient length to do an end-to-end mammary anastomosis. The heart was elevated exposing the lateral wall.  The previous vein graft to the ramus was identified.  This was a small vessel.  Attempts  to  localize a larger vessel in that area were unsuccessful.  An arteriotomy was made.  The initial arteriotomy was made in the hood of the vein graft and this was extended along the vessel distally  A 1 mm probe did pass distally.  The left radial artery  was bevelled.  It was anastomosed end-to-side to the ramus with a running 8-0 Prolene suture.  At completion of the anastomosis, the graft flushed easily.  Cardioplegia was administered down the graft and there was good flow and good hemostasis.  There  was some bleeding from the myocardial dissection.  The vessel was intramyocardial.  Next, the vein graft to the posterior descending was identified.  The posterior descending was a better quality vessel.  It was accepted a 1.5 mm probe distally.  The arteriotomy again was made through the previous anastomosis and the saphenous vein was  bevelled distally.  It was anastomosed end-to-side with a running 7-0 Prolene suture.  Probe passed easily proximally and distally and cardioplegia was administered and there was good flow and good hemostasis.  Next, the proximal and distal ends of the mammary artery were bevelled and an end-to-end anastomosis was performed with a running 8-0 Prolene suture.  At the completion of this anastomosis, the bulldog clamp was removed to  inspect for hemostasis.  Rapid  septal rewarming was noted.  The bulldog clamp was replaced and additional cardioplegia was administered.  The cardioplegia cannula was removed from the ascending aorta.  The proximal anastomoses for the left radial and the vein graft were performed to  4.0 mm punch aortotomies.  The radial graft was placed to the hood of the previous vein graft on the left.  This anastomosis was done with a running 7-0 Prolene suture and then the vein proximal was done with a running 6-0 Prolene suture.  It was placed just above the previous proximal on the right  The patient was placed in Trendelenburg position.  Lidocaine was  administered.  A warm dose of retrograde cardioplegia was administered.  The bulldog clamp  was removed from the left mammary artery.  The aortic crossclamp was removed.  The total crossclamp time was 106 minutes.  The patient spontaneously resumed a rhythm and did not require defibrillation.  While rewarming was completed, all proximal and distal anastomoses were inspected for hemostasis.  Epicardial pacing wires were placed on the right ventricle and right atrium.  When the patient had rewarmed to a core temperature of 37 degrees Celsius,  the retrograde cardioplegia cannula was removed.  He was weaned from cardiopulmonary bypass on the first attempt.  He did not require inotropic support.  Total bypass time was 209 minutes.  Initial cardiac index was 2 liters per minute per meter squared  and the patient remained hemodynamically stable throughout the post-bypass period.  Test dose protamine was administered and was well tolerated.  The atrial and aortic cannulae were removed.  The remainder of protamine was administered without incident.  The chest was copiously irrigated with warm saline.  Hemostasis was achieved.   There was some air leakage from the left lung from the dissection around the mammary artery.  Progel was applied to this area.  A 28-French Blake drain and 36-French chest tube were placed through separate subcostal incisions and secured with #1 silk  sutures.  The sternum was closed with a combination of single and double heavy gauge stainless steel wires.  The pectoralis fascia, subcutaneous tissue and skin were closed in standard fashion.  There was an incorrect needle count with two needles in addition to the ones that had been counted.  A chest x-ray was done and no retained foreign bodies were noted.  The patient then was transported from the operating room to the surgical intensive care unit, intubated and in good condition.  Experienced assistance was necessary for this case due  to surgical complexity.  Jillyn Hidden served as Armed forces technical officer the radial artery and saphenous vein and then providing exposure, retraction of delicate tissues, suctioning and suture  management during the anastomoses.   PUS D: 03/28/2022 6:33:01 pm T: 03/28/2022 8:13:00 pm  JOB: [id number]/ [id number]

## 2022-03-28 NOTE — Plan of Care (Signed)
  Problem: Education: Goal: Understanding of cardiac disease, CV risk reduction, and recovery process will improve Outcome: Progressing Goal: Individualized Educational Video(s) Outcome: Progressing   Problem: Activity: Goal: Ability to tolerate increased activity will improve Outcome: Progressing   Problem: Cardiac: Goal: Ability to achieve and maintain adequate cardiovascular perfusion will improve Outcome: Progressing   Problem: Health Behavior/Discharge Planning: Goal: Ability to safely manage health-related needs after discharge will improve Outcome: Progressing   Problem: Education: Goal: Ability to describe self-care measures that may prevent or decrease complications (Diabetes Survival Skills Education) will improve Outcome: Progressing Goal: Individualized Educational Video(s) Outcome: Progressing   Problem: Coping: Goal: Ability to adjust to condition or change in health will improve Outcome: Progressing   Problem: Fluid Volume: Goal: Ability to maintain a balanced intake and output will improve Outcome: Progressing   Problem: Health Behavior/Discharge Planning: Goal: Ability to identify and utilize available resources and services will improve Outcome: Progressing Goal: Ability to manage health-related needs will improve Outcome: Progressing   Problem: Metabolic: Goal: Ability to maintain appropriate glucose levels will improve Outcome: Progressing   Problem: Nutritional: Goal: Maintenance of adequate nutrition will improve Outcome: Progressing Goal: Progress toward achieving an optimal weight will improve Outcome: Progressing   Problem: Skin Integrity: Goal: Risk for impaired skin integrity will decrease Outcome: Progressing   Problem: Tissue Perfusion: Goal: Adequacy of tissue perfusion will improve Outcome: Progressing   Problem: Education: Goal: Understanding of CV disease, CV risk reduction, and recovery process will improve Outcome:  Progressing Goal: Individualized Educational Video(s) Outcome: Progressing   Problem: Activity: Goal: Ability to return to baseline activity level will improve Outcome: Progressing   Problem: Cardiovascular: Goal: Ability to achieve and maintain adequate cardiovascular perfusion will improve Outcome: Progressing Goal: Vascular access site(s) Level 0-1 will be maintained Outcome: Progressing   Problem: Health Behavior/Discharge Planning: Goal: Ability to safely manage health-related needs after discharge will improve Outcome: Progressing   Problem: Education: Goal: Knowledge of General Education information will improve Description: Including pain rating scale, medication(s)/side effects and non-pharmacologic comfort measures Outcome: Progressing   Problem: Health Behavior/Discharge Planning: Goal: Ability to manage health-related needs will improve Outcome: Progressing   Problem: Clinical Measurements: Goal: Ability to maintain clinical measurements within normal limits will improve Outcome: Progressing Goal: Will remain free from infection Outcome: Progressing Goal: Diagnostic test results will improve Outcome: Progressing Goal: Respiratory complications will improve Outcome: Progressing Goal: Cardiovascular complication will be avoided Outcome: Progressing   Problem: Activity: Goal: Risk for activity intolerance will decrease Outcome: Progressing   Problem: Nutrition: Goal: Adequate nutrition will be maintained Outcome: Progressing   Problem: Coping: Goal: Level of anxiety will decrease Outcome: Progressing   Problem: Elimination: Goal: Will not experience complications related to bowel motility Outcome: Progressing Goal: Will not experience complications related to urinary retention Outcome: Progressing   Problem: Pain Managment: Goal: General experience of comfort will improve Outcome: Progressing   Problem: Safety: Goal: Ability to remain free from  injury will improve Outcome: Progressing   Problem: Skin Integrity: Goal: Risk for impaired skin integrity will decrease Outcome: Progressing   

## 2022-03-28 NOTE — Progress Notes (Signed)
  Echocardiogram Echocardiogram Transesophageal has been performed.  Ryan Herrera 03/28/2022, 10:13 AM

## 2022-03-28 NOTE — Transfer of Care (Signed)
Immediate Anesthesia Transfer of Care Note  Patient: Ryan Herrera  Procedure(s) Performed: REDO CORONARY ARTERY BYPASS GRAFTING (CABG) X2 BYPASSES USING OPEN LEFT RADIAL ARTERY AND ENDOSCOPIC RIGHT GREATER SAPHENOUS VEIN HARVEST (Chest) TRANSESOPHAGEAL ECHOCARDIOGRAM (TEE) (Esophagus)  Patient Location: ICU  Anesthesia Type:General  Level of Consciousness: drowsy, patient cooperative and Patient remains intubated per anesthesia plan  Airway & Oxygen Therapy: Patient remains intubated per anesthesia plan and Patient placed on Ventilator (see vital sign flow sheet for setting)  Post-op Assessment: Report given to RN and Post -op Vital signs reviewed and stable  Post vital signs: Reviewed and stable  Last Vitals:  Vitals Value Taken Time  BP 166/46 03/28/22 1638  Temp 35.6 C 03/28/22 1644  Pulse 86 03/28/22 1644  Resp 10 03/28/22 1644  SpO2 98 % 03/28/22 1644  Vitals shown include unvalidated device data.  Last Pain:  Vitals:   03/28/22 0839  TempSrc: Oral  PainSc: 0-No pain      Patients Stated Pain Goal: 0 (17/00/17 4944)  Complications: No notable events documented.

## 2022-03-28 NOTE — Interval H&P Note (Signed)
History and Physical Interval Note:  03/28/2022 8:27 AM  Ryan Herrera  has presented today for surgery, with the diagnosis of CAD.  The various methods of treatment have been discussed with the patient and family. After consideration of risks, benefits and other options for treatment, the patient has consented to  Procedure(s): REDO CORONARY ARTERY BYPASS GRAFTING (CABG) (N/A) TRANSESOPHAGEAL ECHOCARDIOGRAM (TEE) (N/A) as a surgical intervention.  The patient's history has been reviewed, patient examined, no change in status, stable for surgery.  I have reviewed the patient's chart and labs.  Questions were answered to the patient's satisfaction.     Melrose Nakayama

## 2022-03-28 NOTE — Anesthesia Procedure Notes (Signed)
Arterial Line Insertion Start/End6/22/2023 8:45 AM Performed by: Janace Litten, CRNA, CRNA  Patient location: Pre-op. Preanesthetic checklist: patient identified, IV checked, surgical consent, monitors and equipment checked and pre-op evaluation Lidocaine 1% used for infiltration Right, radial was placed Catheter size: 20 G Hand hygiene performed  and maximum sterile barriers used   Attempts: 2 Procedure performed without using ultrasound guided technique. Following insertion, dressing applied and Biopatch. Post procedure assessment: normal

## 2022-03-29 ENCOUNTER — Encounter (HOSPITAL_COMMUNITY): Payer: Self-pay | Admitting: Thoracic Surgery (Cardiothoracic Vascular Surgery)

## 2022-03-29 ENCOUNTER — Inpatient Hospital Stay (HOSPITAL_COMMUNITY): Payer: Medicare HMO

## 2022-03-29 DIAGNOSIS — I214 Non-ST elevation (NSTEMI) myocardial infarction: Secondary | ICD-10-CM | POA: Diagnosis not present

## 2022-03-29 LAB — POCT I-STAT 7, (LYTES, BLD GAS, ICA,H+H)
Acid-base deficit: 3 mmol/L — ABNORMAL HIGH (ref 0.0–2.0)
Bicarbonate: 22.4 mmol/L (ref 20.0–28.0)
Calcium, Ion: 1.19 mmol/L (ref 1.15–1.40)
HCT: 26 % — ABNORMAL LOW (ref 39.0–52.0)
Hemoglobin: 8.8 g/dL — ABNORMAL LOW (ref 13.0–17.0)
O2 Saturation: 99 %
Patient temperature: 36.5
Potassium: 4.9 mmol/L (ref 3.5–5.1)
Sodium: 138 mmol/L (ref 135–145)
TCO2: 24 mmol/L (ref 22–32)
pCO2 arterial: 39.9 mmHg (ref 32–48)
pH, Arterial: 7.355 (ref 7.35–7.45)
pO2, Arterial: 153 mmHg — ABNORMAL HIGH (ref 83–108)

## 2022-03-29 LAB — CBC
HCT: 29 % — ABNORMAL LOW (ref 39.0–52.0)
HCT: 29.9 % — ABNORMAL LOW (ref 39.0–52.0)
Hemoglobin: 9.7 g/dL — ABNORMAL LOW (ref 13.0–17.0)
Hemoglobin: 9.7 g/dL — ABNORMAL LOW (ref 13.0–17.0)
MCH: 32 pg (ref 26.0–34.0)
MCH: 31.5 pg (ref 26.0–34.0)
MCHC: 33.4 g/dL (ref 30.0–36.0)
MCHC: 32.4 g/dL (ref 30.0–36.0)
MCV: 95.7 fL (ref 80.0–100.0)
MCV: 97.1 fL (ref 80.0–100.0)
Platelets: 146 10*3/uL — ABNORMAL LOW (ref 150–400)
Platelets: 160 10*3/uL (ref 150–400)
RBC: 3.03 MIL/uL — ABNORMAL LOW (ref 4.22–5.81)
RBC: 3.08 MIL/uL — ABNORMAL LOW (ref 4.22–5.81)
RDW: 13.8 % (ref 11.5–15.5)
RDW: 14 % (ref 11.5–15.5)
WBC: 7.7 10*3/uL (ref 4.0–10.5)
WBC: 8.1 10*3/uL (ref 4.0–10.5)
nRBC: 0 % (ref 0.0–0.2)
nRBC: 0 % (ref 0.0–0.2)

## 2022-03-29 LAB — MAGNESIUM
Magnesium: 2.7 mg/dL — ABNORMAL HIGH (ref 1.7–2.4)
Magnesium: 2.5 mg/dL — ABNORMAL HIGH (ref 1.7–2.4)

## 2022-03-29 LAB — BASIC METABOLIC PANEL
Anion gap: 6 (ref 5–15)
Anion gap: 10 (ref 5–15)
BUN: 16 mg/dL (ref 8–23)
BUN: 17 mg/dL (ref 8–23)
CO2: 22 mmol/L (ref 22–32)
CO2: 22 mmol/L (ref 22–32)
Calcium: 7.8 mg/dL — ABNORMAL LOW (ref 8.9–10.3)
Calcium: 7.8 mg/dL — ABNORMAL LOW (ref 8.9–10.3)
Chloride: 111 mmol/L (ref 98–111)
Chloride: 101 mmol/L (ref 98–111)
Creatinine, Ser: 1.07 mg/dL (ref 0.61–1.24)
Creatinine, Ser: 1.17 mg/dL (ref 0.61–1.24)
GFR, Estimated: >60 mL/min (ref >60)
GFR, Estimated: >60 mL/min (ref >60)
Glucose, Bld: 97 mg/dL (ref 70–99)
Glucose, Bld: 184 mg/dL — ABNORMAL HIGH (ref 70–99)
Potassium: 5.1 mmol/L (ref 3.5–5.1)
Potassium: 4.6 mmol/L (ref 3.5–5.1)
Sodium: 139 mmol/L (ref 135–145)
Sodium: 133 mmol/L — ABNORMAL LOW (ref 135–145)

## 2022-03-29 LAB — GLUCOSE, CAPILLARY
Glucose-Capillary: 103 mg/dL — ABNORMAL HIGH (ref 70–99)
Glucose-Capillary: 114 mg/dL — ABNORMAL HIGH (ref 70–99)
Glucose-Capillary: 115 mg/dL — ABNORMAL HIGH (ref 70–99)
Glucose-Capillary: 115 mg/dL — ABNORMAL HIGH (ref 70–99)
Glucose-Capillary: 139 mg/dL — ABNORMAL HIGH (ref 70–99)
Glucose-Capillary: 146 mg/dL — ABNORMAL HIGH (ref 70–99)
Glucose-Capillary: 151 mg/dL — ABNORMAL HIGH (ref 70–99)
Glucose-Capillary: 157 mg/dL — ABNORMAL HIGH (ref 70–99)
Glucose-Capillary: 167 mg/dL — ABNORMAL HIGH (ref 70–99)
Glucose-Capillary: 87 mg/dL (ref 70–99)
Glucose-Capillary: 90 mg/dL (ref 70–99)
Glucose-Capillary: 94 mg/dL (ref 70–99)
Glucose-Capillary: 96 mg/dL (ref 70–99)
Glucose-Capillary: 99 mg/dL (ref 70–99)

## 2022-03-29 MED ORDER — FUROSEMIDE 10 MG/ML IJ SOLN
40.0000 mg | Freq: Once | INTRAMUSCULAR | Status: AC
  Administered 2022-03-29: 40 mg via INTRAVENOUS
  Filled 2022-03-29: qty 4

## 2022-03-29 MED ORDER — POLYETHYLENE GLYCOL 3350 17 G PO PACK
17.0000 g | PACK | Freq: Every day | ORAL | Status: DC | PRN
Start: 2022-03-29 — End: 2022-04-01
  Filled 2022-03-29: qty 1

## 2022-03-29 MED ORDER — METOPROLOL TARTRATE 25 MG/10 ML ORAL SUSPENSION
25.0000 mg | Freq: Two times a day (BID) | ORAL | Status: DC
  Filled 2022-03-29 (×5): qty 10

## 2022-03-29 MED ORDER — ENOXAPARIN SODIUM 40 MG/0.4ML IJ SOSY
40.0000 mg | PREFILLED_SYRINGE | Freq: Every day | INTRAMUSCULAR | Status: DC
  Administered 2022-03-29 – 2022-03-31 (×3): 40 mg via SUBCUTANEOUS
  Filled 2022-03-29 (×3): qty 0.4

## 2022-03-29 MED ORDER — GLIPIZIDE ER 5 MG PO TB24
5.0000 mg | ORAL_TABLET | Freq: Every day | ORAL | Status: DC
  Administered 2022-03-29 – 2022-04-01 (×4): 5 mg via ORAL
  Filled 2022-03-29 (×5): qty 1

## 2022-03-29 MED ORDER — ISOSORBIDE MONONITRATE ER 30 MG PO TB24
30.0000 mg | ORAL_TABLET | Freq: Every day | ORAL | Status: DC
  Administered 2022-03-29 – 2022-04-01 (×4): 30 mg via ORAL
  Filled 2022-03-29 (×4): qty 1

## 2022-03-29 MED ORDER — ALLOPURINOL 100 MG PO TABS
100.0000 mg | ORAL_TABLET | Freq: Every day | ORAL | Status: DC
  Administered 2022-03-29 – 2022-04-01 (×4): 100 mg via ORAL
  Filled 2022-03-29 (×4): qty 1

## 2022-03-29 MED ORDER — EZETIMIBE 10 MG PO TABS
10.0000 mg | ORAL_TABLET | Freq: Every day | ORAL | Status: DC
  Administered 2022-03-29 – 2022-04-01 (×4): 10 mg via ORAL
  Filled 2022-03-29 (×4): qty 1

## 2022-03-29 MED ORDER — METOPROLOL TARTRATE 25 MG PO TABS
25.0000 mg | ORAL_TABLET | Freq: Two times a day (BID) | ORAL | Status: DC
  Administered 2022-03-29 – 2022-04-01 (×7): 25 mg via ORAL
  Filled 2022-03-29 (×7): qty 1

## 2022-03-29 MED ORDER — EMPAGLIFLOZIN 10 MG PO TABS
10.0000 mg | ORAL_TABLET | Freq: Every morning | ORAL | Status: DC
  Administered 2022-03-29 – 2022-04-01 (×4): 10 mg via ORAL
  Filled 2022-03-29 (×4): qty 1

## 2022-03-29 MED ORDER — INSULIN ASPART 100 UNIT/ML IJ SOLN
0.0000 [IU] | INTRAMUSCULAR | Status: DC
  Administered 2022-03-29: 4 [IU] via SUBCUTANEOUS
  Administered 2022-03-29 – 2022-03-30 (×4): 2 [IU] via SUBCUTANEOUS

## 2022-03-29 NOTE — Progress Notes (Signed)
EVENING ROUNDS NOTE :     [address]       [place] [address]             [phone]                 1 Day Post-Op Procedure(s) (LRB): REDO CORONARY ARTERY BYPASS GRAFTING (CABG) X2 BYPASSES USING OPEN LEFT RADIAL ARTERY AND ENDOSCOPIC RIGHT GREATER SAPHENOUS VEIN HARVEST (N/A) TRANSESOPHAGEAL ECHOCARDIOGRAM (TEE) (N/A)   Total Length of Stay:  LOS: 7 days  Events:   No events Stable day    BP (!) 163/57   Pulse 71   Temp 97.8 F (36.6 C) (Oral)   Resp 16   Ht 5' 6.5" (1.689 m)   Wt 94.7 kg   SpO2 99%   BMI 33.19 kg/m   PAP: (16-43)/(7-25) 24/11 CO:  [3.7 L/min-6.2 L/min] 4 L/min CI:  [1.9 L/min/m2-3.1 L/min/m2] 2 L/min/m2  Vent Mode: PSV;CPAP FiO2 (%):  [40 %-50 %] 40 % Set Rate:  [4 bmp] 4 bmp Vt Set:  [520 mL] 520 mL PEEP:  [5 cmH20] 5 cmH20 Pressure Support:  [10 cmH20] 10 cmH20   sodium chloride Stopped (03/29/22 0557)   sodium chloride     sodium chloride 10 mL/hr at 03/29/22 0800    ceFAZolin (ANCEF) IV 2 g (03/29/22 1554)   dexmedetomidine (PRECEDEX) IV infusion Stopped (03/28/22 1938)   insulin Stopped (03/29/22 1003)   lactated ringers     lactated ringers     lactated ringers 20 mL/hr at 03/29/22 0800   nitroGLYCERIN 60 mcg/min (03/29/22 0800)   phenylephrine (NEO-SYNEPHRINE) Adult infusion 10 mcg/min (03/29/22 0800)    I/O last 3 completed shifts: In: 7696.5 [P.O.:120; I.V.:4564.8; Blood:750; IV Piggyback:2261.7] Out: 4826 [Urine:3050; Blood:1450; Chest Tube:326]      Latest Ref Rng & Units 03/29/2022    3:53 AM 03/29/2022    3:50 AM 03/28/2022   10:34 PM  CBC  WBC 4.0 - 10.5 K/uL  7.7  9.4   Hemoglobin 13.0 - 17.0 g/dL 8.8  9.7  27.2   Hematocrit 39.0 - 52.0 % 26.0  29.0  31.4   Platelets 150 - 400 K/uL  146  151        Latest Ref Rng & Units 03/29/2022    3:53 AM 03/29/2022    3:50 AM 03/28/2022   10:34 PM  BMP  Glucose 70 - 99 mg/dL  97  536   BUN 8 - 23 mg/dL  16  16   Creatinine 6.44 - 1.24 mg/dL  0.34   7.42   Sodium 595 - 145 mmol/L 138  139  138   Potassium 3.5 - 5.1 mmol/L 4.9  5.1  5.5   Chloride 98 - 111 mmol/L  111  110   CO2 22 - 32 mmol/L  22  21   Calcium 8.9 - 10.3 mg/dL  7.8  7.6     ABG    Component Value Date/Time   PHART 7.355 03/29/2022 0353   PCO2ART 39.9 03/29/2022 0353   PO2ART 153 (H) 03/29/2022 0353   HCO3 22.4 03/29/2022 0353   TCO2 24 03/29/2022 0353   ACIDBASEDEF 3.0 (H) 03/29/2022 0353   O2SAT 99 03/29/2022 0353       Brynda Greathouse, MD 03/29/2022 4:54 PM

## 2022-03-29 NOTE — Discharge Summary (Addendum)
Physician Discharge Summary  Patient ID: Ryan Herrera MRN: [id number] DOB/AGE: 1946/07/08 76 y.o.  Admit date: 03/22/2022 Discharge date: 04/01/2022  Admission Diagnoses:  Coronary artery disease, recurrent and progressive History of CABG x3 Acute non-ST elevation myocardial infarction Hypertension Hypercholesterolemia Type 2 diabetes mellitus History of iron deficiency anemia History of benign prostatic hyperplasia Degenerative joint disease Hypothyroidism  Discharge Diagnoses:   Coronary artery disease, recurrent and progressive History of CABG x3 Acute non-ST elevation myocardial infarction Hypertension Hypercholesterolemia Statin intolerance Type 2 diabetes mellitus History of iron deficiency anemia History of benign prostatic hyperplasia Degenerative joint disease Hypothyroidism S/P REDO STERNOTOMY AND CABG x 2 Expected acute blood loss anemia   Discharged Condition: stable  Referring: Dr. Angelena Form, MD Primary Care: Ryan Rival, FNP Primary Cardiologist:Branch, Ryan Crochet, MD  History of Present Illness: 76 year old male with multiple cardiac risk factors and known CAD.  Status post CABG x3 at [facility] in 2008.  Now presents with accelerating angina and ruled in for non-ST elevation MI.  Catheterization shows a patent left mammary to LAD with severe native three-vessel disease and occluded vein grafts to the ramus intermedius and posterior descending.  Potential surgical targets include the larger ramus branch, posterior descending, and possibly the acute marginal.  The distal OM was not large enough to graft at his last operation and based on catheterization images would not be graftable in this instance either.  I discussed the possibility of redo coronary bypass grafting with Ryan Herrera.  I informed him of the general nature of the procedure.  He and his family understand that redo surgery is more complicated and carries higher risk than his  original surgery.  I informed them of the need for general anesthesia, the incisions to be used, use of cardiopulmonary bypass, use of drainage tubes and temporary pacemaker wires postoperatively, the expected hospital stay, and the overall recovery.  They understand the risks include, but not limited to death, stroke, MI, DVT, PE, bleeding, possible need for transfusion, infection, cardiac arrhythmias, respiratory failure, renal failure, as well as possibility of other unforeseeable complications.  Hospital Course: Ryan Herrera remained stable following left heart catheterization.  He decided to proceed with surgery here at [facility].  He was taken to the OR electively on 03/28/2022 where redo sternotomy was performed along with two-vessel coronary bypass grafting.  The left radial artery was grafted to the ramus intermediate coronary artery.  Saphenous vein was grafted to the posterior descending coronary artery.  The left internal mammary artery graft to the LAD was also revised.  Following the procedure, he separated from cardiopulmonary bypass without difficulty.  He was transferred to the surgical ICU in stable condition.  He was weaned from the ventilator per standard protocol and extubated by 9 PM on the day of surgery.  He was maintained on nitroglycerin for the radial graft.  On postop day 1, the nitroglycerin was transitioned to oral Imdur.  Metoprolol was increased for moderate hypertension.  He is statin intolerant so he was started back on Zetia but he was not started on a statin after surgery.  He was on Repatha prior to surgery.  Monitoring lines were removed.  He was mobilized.  Lasix was begun for expected postoperative volume overload. The postoperative chest x-ray showed a rounded density in the left lateral lung zone.  Attention during follow-up is recommended.  Consults: None  Significant Diagnostic Studies:   CLINICAL DATA:  Status post CABG   EXAM: PORTABLE CHEST 1  VIEW  COMPARISON:  March 29, 2022   FINDINGS: The PA catheter has been removed with a right IJ sheath remaining. No other support apparatus identified. No pneumothorax. Mild atelectasis in the right base. The right lung is otherwise clear. Stable cardiomediastinal silhouette. Rounded opacity in the periphery of the left lateral lung base is stable. Mild opacity in left base is similar in the interval.   IMPRESSION: 1. Support apparatus as above. 2. Rounded opacity in the periphery of the left lung base is stable. Recommend attention on follow-up. No other changes.     Electronically Signed   By: Dorise Bullion III M.D.   On: 03/30/2022 08:35   Treatments: Surgery  Operative Report    DATE OF PROCEDURE: 03/28/2022   PREOPERATIVE DIAGNOSES:  Severe native and graft 2-vessel coronary artery disease, status post non-ST elevation myocardial infarction.   POSTOPERATIVE DIAGNOSES:  Severe native and graft 2-vessel coronary artery disease, status post non-ST elevation myocardial infarction.   PROCEDURE:  Redo median sternotomy, redo coronary artery bypass grafting x2 (left radial artery to ramus intermedius and saphenous vein graft to posterior descending), repair of left internal mammary artery injury with end-to-end anastomosis.  Endoscopic  vein harvest, right leg and open left radial artery harvest.  Discharge Exam: Blood pressure (!) 143/84, pulse 82, temperature 98 F (36.7 C), temperature source Oral, resp. rate 18, height 5' 6.5" (1.689 m), weight 92.5 kg, SpO2 98 %.  General appearance: alert, cooperative, and no distress Neurologic: intact Heart: Regular rate and rhythm, monitor shows normal sinus rhythm with no significant arrhythmias Lungs: Breath sounds are clear Abdomen: Obese, soft and nontender Extremities: No peripheral edema.  Left arm incision and right lower extremity EVH incision are both intact and dry. Wound: Open to air, well approximated and  dry.  Disposition:  Discharged home in stable condition   Allergies as of 04/01/2022       Reactions   Myrbetriq [mirabegron] Other (See Comments)   Unknown reaction   Niacin And Related Itching   Other Diarrhea, Nausea And Vomiting   PT does not want OPIOIDS   GLP-1 drugs - Nausea, vomiting, diarrhea, belching   Statins Itching        Medication List     STOP taking these medications    doxycycline 100 MG capsule Commonly known as: VIBRAMYCIN   losartan 25 MG tablet Commonly known as: COZAAR   Mag64 64 MG Tbec Generic drug: Magnesium Chloride   valACYclovir 500 MG tablet Commonly known as: VALTREX       TAKE these medications    allopurinol 100 MG tablet Commonly known as: ZYLOPRIM Take 1 tablet (100 mg total) by mouth daily.   aspirin EC 81 MG tablet Take 81 mg by mouth daily.   BD Pen Needle Nano U/F 32G X 4 MM Misc Generic drug: Insulin Pen Needle   ezetimibe 10 MG tablet Commonly known as: ZETIA Take 1 tablet (10 mg total) by mouth daily.   glipiZIDE 5 MG 24 hr tablet Commonly known as: GLUCOTROL XL TAKE 1 TABLET BY MOUTH EVERY DAY WITH BREAKFAST What changed: See the new instructions.   hydrochlorothiazide 25 MG tablet Commonly known as: HYDRODIURIL Take 1 tablet (25 mg total) by mouth daily.   isosorbide mononitrate 30 MG 24 hr tablet Commonly known as: IMDUR Take 1 tablet (30 mg total) by mouth daily. For 2 months then stop. Start taking on: April 02, 2022   Jardiance 10 MG Tabs tablet Generic drug: empagliflozin Take 1 tablet (  10 mg total) by mouth every morning.   ketoconazole 2 % shampoo Commonly known as: NIZORAL Apply 1 Application topically See admin instructions. Uses a small amount with every shower   levothyroxine 100 MCG tablet Commonly known as: SYNTHROID TAKE 1 TABLET BY MOUTH DAILY BEFORE BREAKFAST.   metFORMIN 1000 MG tablet Commonly known as: GLUCOPHAGE Take 1,000 mg by mouth 2 (two) times daily with a meal.    metoprolol tartrate 25 MG tablet Commonly known as: LOPRESSOR Take 1 tablet (25 mg total) by mouth 2 (two) times daily.   OneTouch Verio test strip Generic drug: glucose blood USE AS DIRECTED 4 TIMES A DAY   FreeStyle Precision Neo Test test strip Generic drug: glucose blood Use to test glucose 2 times daily.   pantoprazole 40 MG tablet Commonly known as: Protonix Take 1 tablet (40 mg total) by mouth daily.   polyethylene glycol 17 g packet Commonly known as: MIRALAX / GLYCOLAX Take 17 g by mouth daily as needed for mild constipation.   Repatha SureClick 854 MG/ML Soaj Generic drug: Evolocumab Inject 140 mg into the skin every 14 (fourteen) days.   tadalafil 5 MG tablet Commonly known as: CIALIS Take 5 mg by mouth daily.   traMADol 50 MG tablet Commonly known as: ULTRAM Take 1 tablet (50 mg total) by mouth every 6 (six) hours as needed for up to 7 days for moderate pain.   Tyler Aas FlexTouch 100 UNIT/ML FlexTouch Pen Generic drug: insulin degludec Inject 20 Units into the skin at bedtime.   VITAMIN B-12 PO Take 1 tablet by mouth daily.   VITAMIN D-3 PO Take 1 capsule by mouth daily.        Follow-up Information     Melrose Nakayama, MD. Go on 04/30/2022.   Specialty: Cardiothoracic Surgery Why: Your appointment is at 2:45 PM.  Please arrive about 30 minutes early for x-ray to be performed by [facility] located on the first floor of the same building Contact information: [place] [phone]         Monge, [place], NP. Go on 04/19/2022.   Specialties: Nurse Practitioner, Family Medicine Why: Your appointment is at 10:05am. Contact information: [address] [place] [address] [phone]                 Signed: Antony Odea, PA-C 04/01/2022, 9:13 AM

## 2022-03-30 ENCOUNTER — Inpatient Hospital Stay (HOSPITAL_COMMUNITY): Payer: Medicare HMO

## 2022-03-30 LAB — CBC WITH DIFFERENTIAL/PLATELET
Abs Immature Granulocytes: 0.05 10*3/uL (ref 0.00–0.07)
Basophils Absolute: 0 10*3/uL (ref 0.0–0.1)
Basophils Relative: 1 %
Eosinophils Absolute: 0.5 10*3/uL (ref 0.0–0.5)
Eosinophils Relative: 6 %
HCT: 27.5 % — ABNORMAL LOW (ref 39.0–52.0)
Hemoglobin: 8.7 g/dL — ABNORMAL LOW (ref 13.0–17.0)
Immature Granulocytes: 1 %
Lymphocytes Relative: 16 %
Lymphs Abs: 1.3 10*3/uL (ref 0.7–4.0)
MCH: 30.5 pg (ref 26.0–34.0)
MCHC: 31.6 g/dL (ref 30.0–36.0)
MCV: 96.5 fL (ref 80.0–100.0)
Monocytes Absolute: 0.8 10*3/uL (ref 0.1–1.0)
Monocytes Relative: 10 %
Neutro Abs: 5.2 10*3/uL (ref 1.7–7.7)
Neutrophils Relative %: 66 %
Platelets: 161 10*3/uL (ref 150–400)
RBC: 2.85 MIL/uL — ABNORMAL LOW (ref 4.22–5.81)
RDW: 14 % (ref 11.5–15.5)
WBC: 7.8 10*3/uL (ref 4.0–10.5)
nRBC: 0 % (ref 0.0–0.2)

## 2022-03-30 LAB — BASIC METABOLIC PANEL
Anion gap: 7 (ref 5–15)
BUN: 17 mg/dL (ref 8–23)
CO2: 24 mmol/L (ref 22–32)
Calcium: 8 mg/dL — ABNORMAL LOW (ref 8.9–10.3)
Chloride: 103 mmol/L (ref 98–111)
Creatinine, Ser: 1.11 mg/dL (ref 0.61–1.24)
GFR, Estimated: >60 mL/min (ref >60)
Glucose, Bld: 129 mg/dL — ABNORMAL HIGH (ref 70–99)
Potassium: 4.3 mmol/L (ref 3.5–5.1)
Sodium: 134 mmol/L — ABNORMAL LOW (ref 135–145)

## 2022-03-30 LAB — GLUCOSE, CAPILLARY
Glucose-Capillary: 109 mg/dL — ABNORMAL HIGH (ref 70–99)
Glucose-Capillary: 133 mg/dL — ABNORMAL HIGH (ref 70–99)
Glucose-Capillary: 119 mg/dL — ABNORMAL HIGH (ref 70–99)
Glucose-Capillary: 166 mg/dL — ABNORMAL HIGH (ref 70–99)

## 2022-03-30 MED ORDER — ~~LOC~~ CARDIAC SURGERY, PATIENT & FAMILY EDUCATION: Freq: Once | Status: AC

## 2022-03-30 MED ORDER — INSULIN ASPART 100 UNIT/ML IJ SOLN
0.0000 [IU] | Freq: Three times a day (TID) | INTRAMUSCULAR | Status: DC
  Administered 2022-03-30 – 2022-03-31 (×2): 4 [IU] via SUBCUTANEOUS
  Administered 2022-03-31 – 2022-04-01 (×2): 2 [IU] via SUBCUTANEOUS
  Administered 2022-04-01: 4 [IU] via SUBCUTANEOUS

## 2022-03-30 MED ORDER — SODIUM CHLORIDE 0.9% FLUSH: 3.0000 mL | INTRAVENOUS | Status: DC | PRN

## 2022-03-30 MED ORDER — INSULIN ASPART 100 UNIT/ML IJ SOLN: 0.0000 [IU] | INTRAMUSCULAR | Status: DC

## 2022-03-30 MED ORDER — SODIUM CHLORIDE 0.9 % IV SOLN: 250.0000 mL | INTRAVENOUS | Status: DC | PRN

## 2022-03-30 MED ORDER — SODIUM CHLORIDE 0.9% FLUSH
3.0000 mL | Freq: Two times a day (BID) | INTRAVENOUS | Status: DC
  Administered 2022-03-30 – 2022-03-31 (×4): 3 mL via INTRAVENOUS

## 2022-03-30 NOTE — Progress Notes (Signed)
      [address]       [place] [address]             [phone]                 2 Days Post-Op Procedure(s) (LRB): REDO CORONARY ARTERY BYPASS GRAFTING (CABG) X2 BYPASSES USING OPEN LEFT RADIAL ARTERY AND ENDOSCOPIC RIGHT GREATER SAPHENOUS VEIN HARVEST (N/A) TRANSESOPHAGEAL ECHOCARDIOGRAM (TEE) (N/A)   Events: No events _______________________________________________________________ Vitals: BP (!) 148/70   Pulse 90   Temp 98.4 F (36.9 C) (Oral)   Resp (!) 22   Ht 5' 6.5" (1.689 m)   Wt 93.3 kg   SpO2 92%   BMI 32.70 kg/m  Filed Weights   03/22/22 1500 03/29/22 0500 03/30/22 0500  Weight: 89.8 kg 94.7 kg 93.3 kg     - Neuro: arousable  - Cardiovascular: sinus  Drips: none.      - Pulm: ewob    ABG    Component Value Date/Time   PHART 7.355 03/29/2022 0353   PCO2ART 39.9 03/29/2022 0353   PO2ART 153 (H) 03/29/2022 0353   HCO3 22.4 03/29/2022 0353   TCO2 24 03/29/2022 0353   ACIDBASEDEF 3.0 (H) 03/29/2022 0353   O2SAT 99 03/29/2022 0353    - Abd: ND - Extremity: warm  .Intake/Output      06/23 0701 06/24 0700 06/24 0701 06/25 0700   P.O.     I.V. (mL/kg) 587.9 (6.3)    Blood     IV Piggyback 200.1    Total Intake(mL/kg) 788 (8.4)    Urine (mL/kg/hr) 2000 (0.9) 350 (1)   Blood     Chest Tube     Total Output 2000 350   Net -1212 -350           _______________________________________________________________ Labs:    Latest Ref Rng & Units 03/30/2022    2:54 AM 03/29/2022    5:34 PM 03/29/2022    3:53 AM  CBC  WBC 4.0 - 10.5 K/uL 7.8  8.1    Hemoglobin 13.0 - 17.0 g/dL 8.7  9.7  8.8   Hematocrit 39.0 - 52.0 % 27.5  29.9  26.0   Platelets 150 - 400 K/uL 161  160        Latest Ref Rng & Units 03/30/2022    2:54 AM 03/29/2022    5:34 PM 03/29/2022    3:53 AM  CMP  Glucose 70 - 99 mg/dL 914  782    BUN 8 - 23 mg/dL 17  17    Creatinine 9.56 - 1.24 mg/dL 2.13  0.86    Sodium 578 - 145 mmol/L 134  133  138    Potassium 3.5 - 5.1 mmol/L 4.3  4.6  4.9   Chloride 98 - 111 mmol/L 103  101    CO2 22 - 32 mmol/L 24  22    Calcium 8.9 - 10.3 mg/dL 8.0  7.8      CXR: clear  _______________________________________________________________  Assessment and Plan: POD 2 s/p Redo sternotomy, CABG  Neuro: pain controlled CV: on A/S/BB.  On Imdur for radial Pulm: IS, Ambulation Renal: creat stable GI: on diet Heme: stable ID: afebrile Endo: SSI Dispo: floor today   Corliss Skains 03/30/2022 10:48 AM

## 2022-03-31 LAB — TYPE AND SCREEN
ABO/RH(D): A POS
Antibody Screen: NEGATIVE
Unit division: 0
Unit division: 0
Unit division: 0
Unit division: 0

## 2022-03-31 LAB — BPAM RBC
Blood Product Expiration Date: 202307132359
Blood Product Expiration Date: 202307132359
Blood Product Expiration Date: 202307132359
Blood Product Expiration Date: 202307132359
ISSUE DATE / TIME: 202306220910
ISSUE DATE / TIME: 202306220910
ISSUE DATE / TIME: 202306220910
ISSUE DATE / TIME: 202306220910
Unit Type and Rh: 6200
Unit Type and Rh: 6200
Unit Type and Rh: 6200
Unit Type and Rh: 6200

## 2022-03-31 LAB — BASIC METABOLIC PANEL
Anion gap: 8 (ref 5–15)
BUN: 16 mg/dL (ref 8–23)
CO2: 22 mmol/L (ref 22–32)
Calcium: 8.2 mg/dL — ABNORMAL LOW (ref 8.9–10.3)
Chloride: 108 mmol/L (ref 98–111)
Creatinine, Ser: 1.03 mg/dL (ref 0.61–1.24)
GFR, Estimated: >60 mL/min (ref >60)
Glucose, Bld: 104 mg/dL — ABNORMAL HIGH (ref 70–99)
Potassium: 4.4 mmol/L (ref 3.5–5.1)
Sodium: 138 mmol/L (ref 135–145)

## 2022-03-31 LAB — GLUCOSE, CAPILLARY
Glucose-Capillary: 106 mg/dL — ABNORMAL HIGH (ref 70–99)
Glucose-Capillary: 113 mg/dL — ABNORMAL HIGH (ref 70–99)
Glucose-Capillary: 183 mg/dL — ABNORMAL HIGH (ref 70–99)
Glucose-Capillary: 143 mg/dL — ABNORMAL HIGH (ref 70–99)

## 2022-03-31 LAB — CBC
HCT: 29.1 % — ABNORMAL LOW (ref 39.0–52.0)
Hemoglobin: 9.2 g/dL — ABNORMAL LOW (ref 13.0–17.0)
MCH: 30.7 pg (ref 26.0–34.0)
MCHC: 31.6 g/dL (ref 30.0–36.0)
MCV: 97 fL (ref 80.0–100.0)
Platelets: 207 10*3/uL (ref 150–400)
RBC: 3 MIL/uL — ABNORMAL LOW (ref 4.22–5.81)
RDW: 14 % (ref 11.5–15.5)
WBC: 8.7 10*3/uL (ref 4.0–10.5)
nRBC: 0 % (ref 0.0–0.2)

## 2022-04-01 LAB — BASIC METABOLIC PANEL
Anion gap: 12 (ref 5–15)
BUN: 18 mg/dL (ref 8–23)
CO2: 21 mmol/L — ABNORMAL LOW (ref 22–32)
Calcium: 8.1 mg/dL — ABNORMAL LOW (ref 8.9–10.3)
Chloride: 105 mmol/L (ref 98–111)
Creatinine, Ser: 1.07 mg/dL (ref 0.61–1.24)
GFR, Estimated: >60 mL/min (ref >60)
Glucose, Bld: 130 mg/dL — ABNORMAL HIGH (ref 70–99)
Potassium: 4 mmol/L (ref 3.5–5.1)
Sodium: 138 mmol/L (ref 135–145)

## 2022-04-01 LAB — GLUCOSE, CAPILLARY
Glucose-Capillary: 127 mg/dL — ABNORMAL HIGH (ref 70–99)
Glucose-Capillary: 169 mg/dL — ABNORMAL HIGH (ref 70–99)
Glucose-Capillary: 121 mg/dL — ABNORMAL HIGH (ref 70–99)

## 2022-04-01 LAB — CBC
HCT: 28.3 % — ABNORMAL LOW (ref 39.0–52.0)
Hemoglobin: 8.8 g/dL — ABNORMAL LOW (ref 13.0–17.0)
MCH: 30.6 pg (ref 26.0–34.0)
MCHC: 31.1 g/dL (ref 30.0–36.0)
MCV: 98.3 fL (ref 80.0–100.0)
Platelets: 247 10*3/uL (ref 150–400)
RBC: 2.88 MIL/uL — ABNORMAL LOW (ref 4.22–5.81)
RDW: 13.8 % (ref 11.5–15.5)
WBC: 8.2 10*3/uL (ref 4.0–10.5)
nRBC: 0 % (ref 0.0–0.2)

## 2022-04-01 MED ORDER — ISOSORBIDE MONONITRATE ER 30 MG PO TB24: 30.0000 mg | ORAL_TABLET | Freq: Every day | ORAL | 0 refills | Status: DC

## 2022-04-01 MED ORDER — METOPROLOL TARTRATE 25 MG PO TABS: 25.0000 mg | ORAL_TABLET | Freq: Two times a day (BID) | ORAL | 5 refills | Status: DC

## 2022-04-01 MED ORDER — TRAMADOL HCL 50 MG PO TABS
50.0000 mg | ORAL_TABLET | Freq: Four times a day (QID) | ORAL | 0 refills | Status: AC | PRN
Start: 2022-04-01 — End: 2022-04-08

## 2022-04-02 ENCOUNTER — Telehealth: Payer: Self-pay | Admitting: *Deleted

## 2022-04-02 MED FILL — Mannitol IV Soln 20%: INTRAVENOUS | Qty: 500 | Status: AC

## 2022-04-02 MED FILL — Sodium Chloride IV Soln 0.9%: INTRAVENOUS | Qty: 2000 | Status: AC

## 2022-04-02 MED FILL — Heparin Sodium (Porcine) Inj 1000 Unit/ML: INTRAMUSCULAR | Qty: 30 | Status: AC

## 2022-04-02 MED FILL — Albumin, Human Inj 5%: INTRAVENOUS | Qty: 250 | Status: AC

## 2022-04-02 MED FILL — Calcium Chloride Inj 10%: INTRAVENOUS | Qty: 10 | Status: AC

## 2022-04-02 MED FILL — Sodium Bicarbonate IV Soln 8.4%: INTRAVENOUS | Qty: 50 | Status: AC

## 2022-04-02 MED FILL — Lidocaine HCl Local Soln Prefilled Syringe 100 MG/5ML (2%): INTRAMUSCULAR | Qty: 5 | Status: AC

## 2022-04-02 MED FILL — Electrolyte-R (PH 7.4) Solution: INTRAVENOUS | Qty: 4000 | Status: AC

## 2022-04-03 MED FILL — Heparin Sodium (Porcine) Inj 1000 Unit/ML: Qty: 1000 | Status: AC

## 2022-04-03 MED FILL — Potassium Chloride Inj 2 mEq/ML: INTRAVENOUS | Qty: 40 | Status: AC

## 2022-04-03 MED FILL — Magnesium Sulfate Inj 50%: INTRAMUSCULAR | Qty: 10 | Status: AC

## 2022-04-04 ENCOUNTER — Telehealth: Payer: Self-pay

## 2022-04-04 ENCOUNTER — Telehealth: Payer: Self-pay | Admitting: Internal Medicine

## 2022-04-04 NOTE — Telephone Encounter (Signed)
Pt states that he has been going to the bathroom a lot and says it is lime green color. He also states that he has been urinating a lot. Please advise.

## 2022-04-04 NOTE — Telephone Encounter (Signed)
Returned call to pt informed pt that Dr Harl Bowie is out of the office as well as Diona Browner, they will not be able to answer his question he should call his PCP. Verbalized understanding. Pt states that he does not want to do this (PCP), he will wait.

## 2022-04-04 NOTE — Telephone Encounter (Signed)
Patient needs a refill  pantoprazole (PROTONIX) 40 MG tablet   Pharmacy: [facility]

## 2022-04-04 NOTE — Telephone Encounter (Signed)
Spoke with pt advised to pt that refill was sent 6/2 with refills he should contact pharmacy to fill

## 2022-04-04 NOTE — Telephone Encounter (Signed)
Patient called said the lift chair he has now hard to get in and out of and malfunction with controls. Need a new lift chair needs prescription.

## 2022-04-05 ENCOUNTER — Other Ambulatory Visit: Payer: Self-pay

## 2022-04-05 ENCOUNTER — Other Ambulatory Visit: Payer: Self-pay | Admitting: Nurse Practitioner

## 2022-04-05 DIAGNOSIS — M1990 Unspecified osteoarthritis, unspecified site: Secondary | ICD-10-CM

## 2022-04-05 DIAGNOSIS — M47812 Spondylosis without myelopathy or radiculopathy, cervical region: Secondary | ICD-10-CM

## 2022-04-05 MED ORDER — UNABLE TO FIND: 0 refills | Status: DC

## 2022-04-05 NOTE — Telephone Encounter (Signed)
Spoke with pt he bought a used lift chair a year and a half ago. [facility], [place], and [facility] no longer carry. Printed rx and placed referral for ccm

## 2022-04-08 ENCOUNTER — Telehealth: Payer: Self-pay | Admitting: Nurse Practitioner

## 2022-04-08 ENCOUNTER — Other Ambulatory Visit: Payer: Self-pay

## 2022-04-08 MED ORDER — PANTOPRAZOLE SODIUM 40 MG PO TBEC: 40.0000 mg | DELAYED_RELEASE_TABLET | Freq: Every day | ORAL | 3 refills | Status: DC

## 2022-04-08 NOTE — Telephone Encounter (Signed)
Pt called stating he has been trying to get pantoprazole (PROTONIX) 40 MG tablet . Wants to know if we can please resend this medication? Phar states they have not received this?      [facility]

## 2022-04-08 NOTE — Telephone Encounter (Signed)
Spoke with pt this medication requires prior authorization

## 2022-04-08 NOTE — Telephone Encounter (Signed)
Working on pa

## 2022-04-10 NOTE — Telephone Encounter (Signed)
Called pt and he states that the lime green was his BM not urine. Diona Browner comment given to pt. He states this has resolved. Nothing further needed

## 2022-04-11 ENCOUNTER — Other Ambulatory Visit: Payer: Self-pay

## 2022-04-11 DIAGNOSIS — E1159 Type 2 diabetes mellitus with other circulatory complications: Secondary | ICD-10-CM

## 2022-04-11 MED ORDER — GLUCOSE BLOOD VI STRP: 1.0000 | ORAL_STRIP | 2 refills | Status: DC | PRN

## 2022-04-19 ENCOUNTER — Ambulatory Visit: Payer: Medicare HMO | Admitting: Nurse Practitioner

## 2022-04-23 ENCOUNTER — Ambulatory Visit (INDEPENDENT_AMBULATORY_CARE_PROVIDER_SITE_OTHER): Payer: Medicare HMO | Admitting: Internal Medicine

## 2022-04-23 ENCOUNTER — Encounter: Payer: Self-pay | Admitting: Internal Medicine

## 2022-04-23 VITALS — BP 115/74 | HR 87 | Ht 66.0 in | Wt 181.8 lb

## 2022-04-23 DIAGNOSIS — I25810 Atherosclerosis of coronary artery bypass graft(s) without angina pectoris: Secondary | ICD-10-CM | POA: Diagnosis not present

## 2022-04-23 MED ORDER — REPATHA SURECLICK 140 MG/ML ~~LOC~~ SOAJ: 140.0000 mg | SUBCUTANEOUS | 3 refills | Status: DC

## 2022-04-23 NOTE — Patient Instructions (Addendum)
Medication Instructions:  No Changes In Medications at this time.   *If you need a refill on your cardiac medications before your next appointment, please call your pharmacy*  Lab Work: None Ordered At This Time.  If you have labs (blood work) drawn today and your tests are completely normal, you will receive your results only by: [facility] (if you have MyChart) OR A paper copy in the mail If you have any lab test that is abnormal or we need to change your treatment, we will call you to review the results.  Testing/Procedures: None Ordered At This Time.   Follow-Up: At [facility], you and your health needs are our priority.  As part of our continuing mission to provide you with exceptional heart care, we have created designated Provider Care Teams.  These Care Teams include your primary Cardiologist (physician) and Advanced Practice Providers (APPs -  Physician Assistants and Nurse Practitioners) who all work together to provide you with the care you need, when you need it.  Your next appointment:   6 month(s)  The format for your next appointment:   In Person  Provider:   Janina Mayo, MD    Referral to Cardiac Rehab- someone will reach out to you regarding this.
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
## 2022-04-24 ENCOUNTER — Other Ambulatory Visit: Payer: Self-pay | Admitting: Physician Assistant

## 2022-04-25 ENCOUNTER — Ambulatory Visit: Payer: Medicare HMO | Admitting: Nurse Practitioner

## 2022-04-29 ENCOUNTER — Other Ambulatory Visit: Payer: Self-pay | Admitting: Thoracic Surgery (Cardiothoracic Vascular Surgery)

## 2022-04-29 DIAGNOSIS — Z951 Presence of aortocoronary bypass graft: Secondary | ICD-10-CM

## 2022-04-30 ENCOUNTER — Ambulatory Visit (INDEPENDENT_AMBULATORY_CARE_PROVIDER_SITE_OTHER): Payer: Self-pay | Admitting: Thoracic Surgery (Cardiothoracic Vascular Surgery)

## 2022-04-30 ENCOUNTER — Ambulatory Visit
Admission: RE | Admit: 2022-04-30 | Discharge: 2022-04-30 | Disposition: A | Discharge status: Home / Self Care | Payer: Medicare HMO | Source: Ambulatory Visit | Attending: Thoracic Surgery (Cardiothoracic Vascular Surgery) | Admitting: Thoracic Surgery (Cardiothoracic Vascular Surgery)

## 2022-04-30 VITALS — BP 132/67 | HR 76 | Resp 20 | Ht 66.0 in | Wt 184.0 lb

## 2022-04-30 DIAGNOSIS — Z951 Presence of aortocoronary bypass graft: Secondary | ICD-10-CM

## 2022-04-30 NOTE — Progress Notes (Signed)
[address]       [place] [address]             [phone]     HPI: Ryan Herrera returns for follow-up after recent redo bypass grafting  Ryan Herrera is a 76 year old man man with a past history significant for hypertension, hyperlipidemia, remote tobacco abuse, diabetes, CAD, CABG x3 in 2008, and hypothyroidism who presented with celebrating chest pain.  He ruled in for a non-ST elevation MI.  Catheterization showed severe native and graft two-vessel coronary disease.  There was a patent mammary to the LAD.  I did a redo sternotomy for redo coronary bypass grafting on 03/28/2022.  During the dissection, the LIMA was injured and had to be repaired with an end and anastomosis.  Fortunately there was sufficient length to do so.  His postoperative course was uncomplicated and he went home on day 4.  He has had some incisional discomfort but is only taken Tylenol since he was discharged from the hospital.  That is improving.  He has not had any recurrent anginal symptoms.  Had problems with his appetite and food tasting unusual, which he experiences after general anesthesia.  That is just now starting to improve.  Does have a lack of stamina.  Past Medical History:  Diagnosis Date   Arthritis    hands   Coronary artery disease    Diabetes mellitus without complication ([name])    GERD (gastroesophageal reflux disease)    H/O poliomyelitis    age 76.  now has "weak stomach muscles"   Hypertension    IBS (irritable bowel syndrome)    Iron deficiency anemia 09/22/2018   Renal insufficiency    Thyroid disease     Current Outpatient Medications  Medication Sig Dispense Refill   allopurinol (ZYLOPRIM) 100 MG tablet Take 1 tablet (100 mg total) by mouth daily. 30 tablet 3   aspirin EC 81 MG tablet Take 81 mg by mouth daily.     BD PEN NEEDLE NANO U/F 32G X 4 MM MISC      Cholecalciferol (VITAMIN D-3 PO) Take 1 capsule by mouth daily.     Cyanocobalamin (VITAMIN B-12  PO) Take 1 tablet by mouth daily.     Evolocumab (REPATHA SURECLICK) 924 MG/ML SOAJ Inject 140 mg into the skin every 14 (fourteen) days. 2 mL 3   ezetimibe (ZETIA) 10 MG tablet Take 1 tablet (10 mg total) by mouth daily. 90 tablet 3   glipiZIDE (GLUCOTROL XL) 5 MG 24 hr tablet TAKE 1 TABLET BY MOUTH EVERY DAY WITH BREAKFAST (Patient taking differently: Take 5 mg by mouth daily with breakfast.) 90 tablet 0   glucose blood test strip 1 each by Other route as needed. Use to check blood glucose twice daily as instructed 100 each 2   hydrochlorothiazide (HYDRODIURIL) 25 MG tablet Take 1 tablet (25 mg total) by mouth daily. 90 tablet 3   insulin degludec (TRESIBA FLEXTOUCH) 100 UNIT/ML FlexTouch Pen Inject 20 Units into the skin at bedtime. 15 mL 1   isosorbide mononitrate (IMDUR) 30 MG 24 hr tablet Take 1 tablet (30 mg total) by mouth daily. For 2 months then stop. 60 tablet 0   JARDIANCE 10 MG TABS tablet Take 1 tablet (10 mg total) by mouth every morning. 30 tablet 3   ketoconazole (NIZORAL) 2 % shampoo Apply 1 Application topically See admin instructions. Uses a small amount with every shower     levothyroxine (SYNTHROID) 100  MCG tablet TAKE 1 TABLET BY MOUTH DAILY BEFORE BREAKFAST. (Patient taking differently: Take 100 mcg by mouth daily before breakfast.) 90 tablet 0   metFORMIN (GLUCOPHAGE) 1000 MG tablet Take 1,000 mg by mouth 2 (two) times daily with a meal.     metoprolol tartrate (LOPRESSOR) 25 MG tablet Take 1 tablet (25 mg total) by mouth 2 (two) times daily. 60 tablet 5   pantoprazole (PROTONIX) 40 MG tablet Take 1 tablet (40 mg total) by mouth daily. 90 tablet 3   polyethylene glycol (MIRALAX / GLYCOLAX) 17 g packet Take 17 g by mouth daily as needed for mild constipation.     tadalafil (CIALIS) 5 MG tablet Take 5 mg by mouth daily.     UNABLE TO FIND Lift chair DX: F81.017 1 each 0   No current facility-administered medications for this visit.    Physical Exam BP 132/67   Pulse 76    Resp 20   Ht '5\' 6"'$  (1.676 m)   Wt 184 lb (83.5 kg)   SpO2 96% Comment: RA  BMI 29.43 kg/m  76 year old man in no acute distress Alert and oriented x3 with no focal deficits Lungs clear with equal breath sounds bilaterally Cardiac regular rate and rhythm, no murmur or rub Sternum stable, incision clean dry and intact Leg incision intact, moderate seroma right thigh Left arm incision healing well, left hand neuro intact  Diagnostic Tests: I personally reviewed the chest x-ray.  Shows postoperative changes.  Impression: Ryan Herrera is a 76 year old man with a past history significant for hypertension, hyperlipidemia, remote tobacco abuse, diabetes, CAD, CABG x3 in 2008, and hypothyroidism.  He presented with chest pain and ruled in for non-ST elevation MI.  He had severe native and graft two-vessel CAD.  He underwent redo coronary bypass grafting and repair of the left mammary on 03/28/2022.  His early postoperative course was uncomplicated and he went home on day 4.  He is doing well at this time.  Has not had any recurrent anginal pain.  He does tire easily and does not have much stamina.  That should improve dramatically over the next 3 to 4 weeks.  He is not having to take any narcotics.  He is using Tylenol for discomfort.  He does have a seroma in the right leg but there is no erythema or significant tenderness.  He knows to let me know right away if he experiences redness, increase in size, or tenderness.  He may begin driving.  Appropriate precautions were discussed.  He should wait at least 2 more weeks before lifting anything over 10 pounds.  Medically cleared for cardiac rehab when his energy levels improved.  Plan: Continue to follow-up with Dr. Harl Bowie of cardiology I will be happy to see Ryan Herrera back anytime in the future if I can be of any further assistance with his care.  Melrose Nakayama, MD [facility] [phone]

## 2022-05-02 ENCOUNTER — Encounter: Payer: Self-pay | Admitting: Nurse Practitioner

## 2022-05-02 ENCOUNTER — Ambulatory Visit (INDEPENDENT_AMBULATORY_CARE_PROVIDER_SITE_OTHER): Payer: Medicare HMO | Admitting: Nurse Practitioner

## 2022-05-02 ENCOUNTER — Telehealth: Payer: Self-pay

## 2022-05-02 VITALS — BP 112/71 | HR 66 | Ht 66.0 in | Wt 185.0 lb

## 2022-05-02 DIAGNOSIS — E119 Type 2 diabetes mellitus without complications: Secondary | ICD-10-CM | POA: Diagnosis not present

## 2022-05-02 DIAGNOSIS — Z951 Presence of aortocoronary bypass graft: Secondary | ICD-10-CM | POA: Diagnosis not present

## 2022-05-02 DIAGNOSIS — E038 Other specified hypothyroidism: Secondary | ICD-10-CM

## 2022-05-02 DIAGNOSIS — I10 Essential (primary) hypertension: Secondary | ICD-10-CM | POA: Diagnosis not present

## 2022-05-02 DIAGNOSIS — D508 Other iron deficiency anemias: Secondary | ICD-10-CM

## 2022-05-02 MED ORDER — UNABLE TO FIND: 0 refills | Status: DC

## 2022-05-02 MED ORDER — METOPROLOL SUCCINATE ER 25 MG PO TB24: 25.0000 mg | ORAL_TABLET | Freq: Every day | ORAL | 3 refills | Status: DC

## 2022-05-02 NOTE — Assessment & Plan Note (Addendum)
Lab Results  Component Value Date   HGBA1C 7.6 (H) 03/23/2022  Chronic condition controlled Has upcoming appointment with endocrinology Currently on glipizide 5 mg daily, Jardiance 10 mg daily, metformin 1000 mg twice daily Tresiba 20 units daily. Avoid sugar sweets ordered Patient encouraged to maintain close follow-up with endocrinologist Check urine microalbumin labs at next visit

## 2022-05-02 NOTE — Telephone Encounter (Signed)
Spoke with pt

## 2022-05-02 NOTE — Telephone Encounter (Signed)
Called pt to let him know that Kristin Bruins has sent in a rx for metoprolol into the pharmacy to be taken once daily no answer left vm

## 2022-05-02 NOTE — Assessment & Plan Note (Addendum)
Patient stated that he is doing well since after having surgery Currently denies chest pain He plans to call cardiac rehab in 2 weeks.  Patient encouraged to engage in regular walking exercises daily as tolerated

## 2022-05-02 NOTE — Assessment & Plan Note (Signed)
BP Readings from Last 3 Encounters:  05/02/22 112/71  04/30/22 132/67  04/23/22 115/74  Condition well-controlled on hydrochlorothiazide 25 mg daily, metoprolol titrate, patient taking 25 mg daily instead of twice daily,, isosorbide mononitrate 30 mg daily. I switched metoprolol to 25 mg XL daily since patient prefers to be taking medication daily instead of twice daily.  DASH diet advised, patient encouraged to engage in regular daily walking exercises as tolerated Appreciate collaboration with cardiology

## 2022-05-02 NOTE — Patient Instructions (Addendum)
Nurse please order blood pressure today    It is important that you exercise regularly at least 30 minutes 5 times a week.  Think about what you will eat, plan ahead. Choose " clean, green, fresh or frozen" over canned, processed or packaged foods which are more sugary, salty and fatty. 70 to 75% of food eaten should be vegetables and fruit. Three meals at set times with snacks allowed between meals, but they must be fruit or vegetables. Aim to eat over a 12 hour period , example 7 am to 7 pm, and STOP after  your last meal of the day. Drink water,generally about 64 ounces per day, no other drink is as healthy. Fruit juice is best enjoyed in a healthy way, by EATING the fruit.  Thanks for choosing [facility], we consider it a privelige to serve you.

## 2022-05-02 NOTE — Assessment & Plan Note (Signed)
Lab Results  Component Value Date   TSH 2.349 03/24/2022  Currently on levothyroxine 100 mcg tablet daily Continue current medication Follow-up in 4 months

## 2022-05-02 NOTE — Progress Notes (Signed)
Ryan Herrera     MRN: [id number]      DOB: 02-Sep-1946   HPI Mr. Boomershine with past medical history of CAD, essential hypertension, diabetes type 2, iron deficiency anemia, CKD is here for follow up for hypothyroidism.   Patient recently had redo CABG on 03/28/2022.  Patient stated that he has been doing fine and getting stronger since after the surgery.  He currently denies chest pain.  He was taking all losartan while in the hospital and placed on metoprolol titrate 25 mg twice daily.  Patient stated that he has been taking the medication daily instead of twice daily.  Patient stated that he would rather take metoprolol daily instead of twice daily patient stated that he will call cardiac rehab in 2 weeks so he can be put on the schedule for rehab.  Patient denies headache, dizziness, syncope   Hypothyroidism.  Currently on levothyroxine 100 mcg tablet daily, medication was increased at his last visit due to  elevated TSH level. Recent TSH level checked when in the hospital was normal , though T4 was elevated at 1.14. pt refused blood work today , has upcoming appointment with endo. Has chronic fatigue , denies palpitations , constipation    Iron deficiency anemia .  Patient stated that he was getting iron infusion in the past , he could not take iron pills due to severe constipation .  Patient stated that he has chronic fatigue , he declined referral to hematology today stating that he wants to get better from his surgery first .  Declined labs today    ROS Denies recent fever or chills. Denies sinus pressure, nasal congestion, ear pain or sore throat. Denies chest congestion, productive cough or wheezing. Denies chest pains, palpitations and leg swelling Denies abdominal pain, nausea, vomiting,diarrhea or constipation.   Denies dysuria, frequency, hesitancy or incontinence. Denies joint pain, swelling and limitation in mobility. Denies headaches, seizures, numbness, or  tingling. Denies depression, anxiety or insomnia. Denies skin break down or rash.   PE  BP 112/71 (BP Location: Right Arm, Patient Position: Sitting, Cuff Size: Normal)   Pulse 66   Ht '5\' 6"'$  (1.676 m)   Wt 185 lb (83.9 kg)   SpO2 96%   BMI 29.86 kg/m   Patient alert and oriented and in no cardiopulmonary distress.  Chest: Clear to auscultation bilaterally.  CVS: S1, S2 no murmurs, no S3.Regular rate.  ABD: Soft non tender.   Ext: No edema  MS: Adequate ROM spine, shoulders, hips and knees.  Skin: Midsternal incision and right leg incision healing well no redness or swelling noted  Psych: Good eye contact, normal affect. Memory intact not anxious or depressed appearing.  CNS: CN 2-12 intact, power,  normal throughout.no focal deficits noted.   Assessment & Plan  S/P CABG x 2 Patient stated that he is doing well since after having surgery Currently denies chest pain He plans to call cardiac rehab in 2 weeks.  Patient encouraged to engage in regular walking exercises daily as tolerated  Essential hypertension BP Readings from Last 3 Encounters:  05/02/22 112/71  04/30/22 132/67  04/23/22 115/74  Condition well-controlled on hydrochlorothiazide 25 mg daily, metoprolol titrate, patient taking 25 mg daily instead of twice daily,, isosorbide mononitrate 30 mg daily. I switched metoprolol to 25 mg XL daily since patient prefers to be taking medication daily instead of twice daily.  DASH diet advised, patient encouraged to engage in regular daily walking exercises as tolerated Appreciate collaboration  with cardiology  Iron deficiency anemia Lab Results  Component Value Date   WBC 8.2 04/01/2022   HGB 8.8 (L) 04/01/2022   HCT 28.3 (L) 04/01/2022   MCV 98.3 04/01/2022   PLT 247 04/01/2022  Chronic condition Has had iron infusion in the past Patient refused labs today Patient states that he  will consider referral to hematology at his next  appointment  Hypothyroidism Lab Results  Component Value Date   TSH 2.349 03/24/2022  Currently on levothyroxine 100 mcg tablet daily Continue current medication Follow-up in 4 months  Diabetes mellitus type 2 in nonobese [facility]) Lab Results  Component Value Date   HGBA1C 7.6 (H) 03/23/2022  Chronic condition controlled Has upcoming appointment with endocrinology Currently on glipizide 5 mg daily, Jardiance 10 mg daily, metformin 1000 mg twice daily Tresiba 20 units daily. Avoid sugar sweets ordered Patient encouraged to maintain close follow-up with endocrinologist Check urine microalbumin labs at next visit

## 2022-05-02 NOTE — Assessment & Plan Note (Signed)
Lab Results  Component Value Date   WBC 8.2 04/01/2022   HGB 8.8 (L) 04/01/2022   HCT 28.3 (L) 04/01/2022   MCV 98.3 04/01/2022   PLT 247 04/01/2022  Chronic condition Has had iron infusion in the past Patient refused labs today Patient states that he  will consider referral to hematology at his next appointment

## 2022-05-02 NOTE — Telephone Encounter (Signed)
Pt returning call

## 2022-05-13 ENCOUNTER — Telehealth: Payer: Self-pay | Admitting: Nurse Practitioner

## 2022-05-13 NOTE — Telephone Encounter (Signed)
(  Noted/sleeved & copied)    Handicap renewal form

## 2022-05-14 NOTE — Telephone Encounter (Signed)
Patient asked if Ryan Herrera would check permanent  not temporary.

## 2022-05-14 NOTE — Telephone Encounter (Signed)
Patient called asking if can be completed this AM will be in town.

## 2022-05-14 NOTE — Telephone Encounter (Signed)
Spoke with pt

## 2022-05-17 ENCOUNTER — Other Ambulatory Visit: Payer: Self-pay | Admitting: Nurse Practitioner

## 2022-05-23 ENCOUNTER — Telehealth: Payer: Self-pay | Admitting: Interventional Cardiology

## 2022-05-23 NOTE — Telephone Encounter (Signed)
Patient called and wanted to know if he could start back taking his medication that was stopped in the hospital. They are tadalafil (CIALIS) 5 MG tablet [[id number]] and valACYclovir (VALTREX) 500 MG tablet [[id number]]  DISCONTINUED

## 2022-05-23 NOTE — Telephone Encounter (Signed)
Returned call to patient.  He states his valacyclovir was discontinued during hospitalization in June 2023. He states he needs to start back on this medication to treat his fever blisters, they have started appearing again and he will not tolerate it. He states he will start this medication again unless given a "very good reason not to."   He also states tadalafil (Cialis) is listed but was not checked on his discharge medication list to take. He is asking if it is okay for him to start taking this medication again.  Will forward message to primary cardiologist Dr. Harl Bowie to review and advise.

## 2022-05-23 NOTE — Telephone Encounter (Signed)
Patient wants to know about the medication that was stopped while he was in the emergency room. The medications are tadalafil (CIALIS) 5 MG tablet  valACYclovir (VALTREX) 500 MG tablet [[id number]]  DISCONTINUED

## 2022-05-23 NOTE — Telephone Encounter (Signed)
Spoke with patient regarding Dr. Nelly Laurence reply: Please let him know valacyclovir is ok. Cialis can't be taken on the same day as imdur.  The recommendations is no cialis or viagra with nitrates   Patient states he needs to take the Cialis '5mg'$  QD for his prostate, he states it was prescribed for him to take daily to help him urinate.  Patient was started on Imdur '30mg'$  QD during hospitalization in June 2023. He asked if he needs to be switched to a different medication for BP control to be able to take the Cialis daily.

## 2022-05-27 ENCOUNTER — Other Ambulatory Visit: Payer: Self-pay

## 2022-05-27 MED ORDER — RANOLAZINE ER 500 MG PO TB12: 500.0000 mg | ORAL_TABLET | Freq: Two times a day (BID) | ORAL | 1 refills | Status: DC

## 2022-05-27 NOTE — Telephone Encounter (Signed)
Called patient, made aware of change below.  Patient verbalized understanding- we will send in new medication to his pharmacy he would like to try it.

## 2022-05-27 NOTE — Telephone Encounter (Signed)
Called patient back- advised of message below.  He states that he is still having chest pain- so he has since stopped the Cialis, he states he is going to "finish his imdur" and then start back on the Cialis- I did advise the imdur was a long term medication to help with chest pain, but he states if he does not take the imdur he has chest pain, and if he does not take Cialis he is unable to urinate. He would like to discuss therapy that is non nitrate, if this is an option so he is able to take his medication like he is suppose to.  Thanks!

## 2022-05-29 ENCOUNTER — Encounter (HOSPITAL_COMMUNITY): Payer: Self-pay

## 2022-05-29 ENCOUNTER — Encounter (HOSPITAL_COMMUNITY)
Admission: RE | Admit: 2022-05-29 | Discharge: 2022-05-29 | Disposition: A | Discharge status: Home / Self Care | Payer: Medicare HMO | Source: Ambulatory Visit | Attending: Internal Medicine | Admitting: Internal Medicine

## 2022-05-29 VITALS — BP 116/64 | HR 89 | Ht 66.0 in | Wt 181.4 lb

## 2022-05-29 DIAGNOSIS — Z951 Presence of aortocoronary bypass graft: Secondary | ICD-10-CM | POA: Insufficient documentation

## 2022-05-29 DIAGNOSIS — I214 Non-ST elevation (NSTEMI) myocardial infarction: Secondary | ICD-10-CM | POA: Diagnosis present

## 2022-05-29 LAB — GLUCOSE, CAPILLARY: Glucose-Capillary: 206 mg/dL — ABNORMAL HIGH (ref 70–99)

## 2022-05-29 NOTE — Progress Notes (Signed)
Cardiac Individual Treatment Plan  Patient Details  Name: Ryan Herrera MRN: [id number] Date of Birth: November 10, 1945 Referring Provider:   Flowsheet Row CARDIAC REHAB PHASE II ORIENTATION from 05/29/2022 in [facility]  Referring Provider Dr. Lucien Mons       Initial Encounter Date:  Flowsheet Row CARDIAC REHAB PHASE II ORIENTATION from 05/29/2022 in [facility]  Date 05/29/22       Visit Diagnosis: NSTEMI (non-ST elevated myocardial infarction) ([facility])  S/P CABG x 2  Patient's Home Medications on Admission:  Current Outpatient Medications:    acetaminophen (TYLENOL) 650 MG CR tablet, Take 1,300 mg by mouth every 8 (eight) hours as needed for pain., Disp: , Rfl:    allopurinol (ZYLOPRIM) 100 MG tablet, Take 1 tablet (100 mg total) by mouth daily., Disp: 30 tablet, Rfl: 3   amoxicillin (AMOXIL) 500 MG capsule, Take 2,000 mg by mouth See admin instructions. Take 2000 mg 1 hour prior to dental work, Disp: , Rfl:    aspirin EC 81 MG tablet, Take 81 mg by mouth daily., Disp: , Rfl:    Betamethasone Dipropionate Aug (DIPROLENE EX), Apply 1 Application topically daily as needed (rash)., Disp: , Rfl:    Carboxymethylcellulose Sodium (THERATEARS PF OP), Place 1 drop into both eyes daily as needed (dry eyes)., Disp: , Rfl:    Cholecalciferol (VITAMIN D) 50 MCG (2000 UT) tablet, Take 2,000 Units by mouth daily., Disp: , Rfl:    clotrimazole-betamethasone (LOTRISONE) cream, Apply 1 Application topically 2 (two) times daily as needed (rash)., Disp: , Rfl:    cyanocobalamin (VITAMIN B12) 1000 MCG tablet, Take 1,000 mcg by mouth daily., Disp: , Rfl:    Evolocumab (REPATHA SURECLICK) 892 MG/ML SOAJ, Inject 140 mg into the skin every 14 (fourteen) days., Disp: 2 mL, Rfl: 3   ezetimibe (ZETIA) 10 MG tablet, Take 1 tablet (10 mg total) by mouth daily., Disp: 90 tablet, Rfl: 3   glipiZIDE (GLUCOTROL XL) 5 MG 24 hr tablet, TAKE 1 TABLET BY MOUTH EVERY DAY  WITH BREAKFAST (Patient taking differently: Take 5 mg by mouth daily with breakfast.), Disp: 90 tablet, Rfl: 0   hydrochlorothiazide (HYDRODIURIL) 25 MG tablet, Take 1 tablet (25 mg total) by mouth daily., Disp: 90 tablet, Rfl: 3   insulin degludec (TRESIBA FLEXTOUCH) 100 UNIT/ML FlexTouch Pen, Inject 20 Units into the skin at bedtime., Disp: 15 mL, Rfl: 1   JARDIANCE 10 MG TABS tablet, Take 1 tablet (10 mg total) by mouth every morning., Disp: 30 tablet, Rfl: 3   ketoconazole (NIZORAL) 2 % shampoo, Apply 1 Application topically See admin instructions. Uses a small amount with every shower, Disp: , Rfl:    levothyroxine (SYNTHROID) 100 MCG tablet, TAKE 1 TABLET BY MOUTH DAILY BEFORE BREAKFAST., Disp: 90 tablet, Rfl: 0   Lidocaine HCl (LIDOCAINE PLUS EX), Apply 1 Application topically daily as needed (itching/pain)., Disp: , Rfl:    metFORMIN (GLUCOPHAGE) 1000 MG tablet, Take 1,000 mg by mouth 2 (two) times daily with a meal., Disp: , Rfl:    metoprolol succinate (TOPROL-XL) 25 MG 24 hr tablet, Take 1 tablet (25 mg total) by mouth daily., Disp: 90 tablet, Rfl: 3   pantoprazole (PROTONIX) 40 MG tablet, Take 1 tablet (40 mg total) by mouth daily., Disp: 90 tablet, Rfl: 3   triamcinolone cream (KENALOG) 0.5 %, Apply 1 Application topically 2 (two) times daily as needed (tick bites)., Disp: , Rfl:    valACYclovir (VALTREX) 500 MG tablet, Take 500 mg by  mouth 2 (two) times daily., Disp: , Rfl:    BD PEN NEEDLE NANO U/F 32G X 4 MM MISC, , Disp: , Rfl:    glucose blood test strip, 1 each by Other route as needed. Use to check blood glucose twice daily as instructed, Disp: 100 each, Rfl: 2   omeprazole (PRILOSEC) 20 MG capsule, TAKE 1 CAPSULE BY MOUTH EVERY DAY (Patient not taking: Reported on 05/27/2022), Disp: 90 capsule, Rfl: 1   ranolazine (RANEXA) 500 MG 12 hr tablet, Take 1 tablet (500 mg total) by mouth 2 (two) times daily., Disp: 60 tablet, Rfl: 1   tadalafil (CIALIS) 5 MG tablet, Take 5 mg by mouth  daily., Disp: , Rfl:    UNABLE TO FIND, Lift chair DX: M47.812, Disp: 1 each, Rfl: 0   UNABLE TO FIND, Blood pressure cuff DX: I10, Disp: 1 each, Rfl: 0  Past Medical History: Past Medical History:  Diagnosis Date   Arthritis    hands   Coronary artery disease    Diabetes mellitus without complication ([name])    GERD (gastroesophageal reflux disease)    H/O poliomyelitis    age 89.  now has "weak stomach muscles"   Hypertension    IBS (irritable bowel syndrome)    Iron deficiency anemia 09/22/2018   Renal insufficiency    Thyroid disease     Tobacco Use: Social History   Tobacco Use  Smoking Status Former   Packs/day: 1.00   Years: 38.00   Total pack years: 38.00   Types: Cigarettes   Quit date: 12/1996   Years since quitting: 25.4  Smokeless Tobacco Never    Labs: Review Flowsheet  More data exists      Latest Ref Rng & Units 03/14/2022 03/23/2022 03/27/2022 03/28/2022 03/29/2022  Labs for ITP Cardiac and Pulmonary Rehab  Hemoglobin A1c 4.8 - 5.6 % 7.6  7.6  - - -  PH, Arterial 7.35 - 7.45 - - 7.47  7.292  7.333  7.246  7.353  7.401  7.364  7.355   PCO2 arterial 32 - 48 mmHg - - 39  42.9  40.8  49.5  44.5  41.2  46.4  39.9   Bicarbonate 20.0 - 28.0 mmol/L - - 28.4  20.6  21.4  21.9  24.7  24.5  25.6  26.4  22.4   TCO2 22 - 32 mmol/L - - - '22  23  23  23  26  24  25  27  26  27  24  25  28  24   '$ Acid-base deficit 0.0 - 2.0 mmol/L - - - 6.0  4.0  6.0  1.0  1.0  3.0   O2 Saturation % - - 98.4  97  99  97  100  77  100  100  99     Capillary Blood Glucose: Lab Results  Component Value Date   GLUCAP 206 (H) 05/29/2022   GLUCAP 121 (H) 04/01/2022   GLUCAP 169 (H) 04/01/2022   GLUCAP 127 (H) 04/01/2022   GLUCAP 143 (H) 03/31/2022     Exercise Target Goals: Exercise Program Goal: Individual exercise prescription set using results from initial 6 min walk test and THRR while considering  patient's activity barriers and safety.   Exercise Prescription Goal: Starting  with aerobic activity 30 plus minutes a day, 3 days per week for initial exercise prescription. Provide home exercise prescription and guidelines that participant acknowledges understanding prior to discharge.  Activity Barriers & Risk Stratification:  Activity Barriers & Cardiac Risk Stratification - 05/29/22 1306       Activity Barriers & Cardiac Risk Stratification   Activity Barriers Left Knee Replacement;Right Knee Replacement;Arthritis;Back Problems;Chest Pain/Angina;History of Falls    Cardiac Risk Stratification High             6 Minute Walk:  6 Minute Walk     Row Name 05/29/22 1407         6 Minute Walk   Phase Initial     Distance 1200 feet     Walk Time 6 minutes     # of Rest Breaks 0     MPH 2.27     METS 2.02     RPE 11     VO2 Peak 7.07     Symptoms No     Resting HR 89 bpm     Resting BP 116/64     Resting Oxygen Saturation  96 %     Exercise Oxygen Saturation  during 6 min walk 97 %     Max Ex. HR 94 bpm     Max Ex. BP 118/68     2 Minute Post BP 112/60              Oxygen Initial Assessment:   Oxygen Re-Evaluation:   Oxygen Discharge (Final Oxygen Re-Evaluation):   Initial Exercise Prescription:  Initial Exercise Prescription - 05/29/22 1400       Date of Initial Exercise RX and Referring Provider   Date 05/29/22    Referring Provider Dr. Lucien Mons    Expected Discharge Date 08/21/22      NuStep   Level 1    SPM 60    Minutes 22      Arm Ergometer   Level 1    RPM 50    Minutes 17      Prescription Details   Frequency (times per week) 3    Duration Progress to 30 minutes of continuous aerobic without signs/symptoms of physical distress      Intensity   THRR 40-80% of Max Heartrate 57-114    Ratings of Perceived Exertion 11-13      Resistance Training   Training Prescription Yes    Weight 4    Reps 10-15             Perform Capillary Blood Glucose checks as needed.  Exercise Prescription  Changes:   Exercise Comments:   Exercise Goals and Review:   Exercise Goals     Row Name 05/29/22 1411             Exercise Goals   Increase Physical Activity Yes       Intervention Provide advice, education, support and counseling about physical activity/exercise needs.;Develop an individualized exercise prescription for aerobic and resistive training based on initial evaluation findings, risk stratification, comorbidities and participant's personal goals.       Expected Outcomes Short Term: Attend rehab on a regular basis to increase amount of physical activity.;Long Term: Add in home exercise to make exercise part of routine and to increase amount of physical activity.;Long Term: Exercising regularly at least 3-5 days a week.       Increase Strength and Stamina Yes       Intervention Provide advice, education, support and counseling about physical activity/exercise needs.;Develop an individualized exercise prescription for aerobic and resistive training based on initial evaluation findings, risk stratification, comorbidities and participant's personal goals.       Expected Outcomes Short  Term: Increase workloads from initial exercise prescription for resistance, speed, and METs.;Short Term: Perform resistance training exercises routinely during rehab and add in resistance training at home;Long Term: Improve cardiorespiratory fitness, muscular endurance and strength as measured by increased METs and functional capacity (6MWT)       Able to understand and use rate of perceived exertion (RPE) scale Yes       Intervention Provide education and explanation on how to use RPE scale       Expected Outcomes Short Term: Able to use RPE daily in rehab to express subjective intensity level;Long Term:  Able to use RPE to guide intensity level when exercising independently       Knowledge and understanding of Target Heart Rate Range (THRR) Yes       Intervention Provide education and explanation of  THRR including how the numbers were predicted and where they are located for reference       Expected Outcomes Short Term: Able to state/look up THRR;Short Term: Able to use daily as guideline for intensity in rehab;Long Term: Able to use THRR to govern intensity when exercising independently       Able to check pulse independently Yes       Intervention Provide education and demonstration on how to check pulse in carotid and radial arteries.;Review the importance of being able to check your own pulse for safety during independent exercise       Expected Outcomes Short Term: Able to explain why pulse checking is important during independent exercise;Long Term: Able to check pulse independently and accurately       Understanding of Exercise Prescription Yes       Intervention Provide education, explanation, and written materials on patient's individual exercise prescription       Expected Outcomes Short Term: Able to explain program exercise prescription;Long Term: Able to explain home exercise prescription to exercise independently                Exercise Goals Re-Evaluation :    Discharge Exercise Prescription (Final Exercise Prescription Changes):   Nutrition:  Target Goals: Understanding of nutrition guidelines, daily intake of sodium '1500mg'$ , cholesterol '200mg'$ , calories 30% from fat and 7% or less from saturated fats, daily to have 5 or more servings of fruits and vegetables.  Biometrics:  Pre Biometrics - 05/29/22 1412       Pre Biometrics   Height '5\' 6"'$  (1.676 m)    Weight 181 lb 7 oz (82.3 kg)    Waist Circumference 38 inches    Hip Circumference 40 inches    Waist to Hip Ratio 0.95 %    BMI (Calculated) 29.3    Triceps Skinfold 14 mm    % Body Fat 27.5 %    Grip Strength 17 kg    Flexibility 0 in    Single Leg Stand 6 seconds              Nutrition Therapy Plan and Nutrition Goals:  Nutrition Therapy & Goals - 05/29/22 1314       Intervention Plan    Intervention Nutrition handout(s) given to patient.    Expected Outcomes Short Term Goal: Understand basic principles of dietary content, such as calories, fat, sodium, cholesterol and nutrients.             Nutrition Assessments:  Nutrition Assessments - 05/29/22 1315       MEDFICTS Scores   Pre Score 33  MEDIFICTS Score Key: ?70 Need to make dietary changes  40-70 Heart Healthy Diet ? 40 Therapeutic Level Cholesterol Diet   Picture Your Plate Scores: <38 Unhealthy dietary pattern with much room for improvement. 41-50 Dietary pattern unlikely to meet recommendations for good health and room for improvement. 51-60 More healthful dietary pattern, with some room for improvement.  >60 Healthy dietary pattern, although there may be some specific behaviors that could be improved.    Nutrition Goals Re-Evaluation:   Nutrition Goals Discharge (Final Nutrition Goals Re-Evaluation):   Psychosocial: Target Goals: Acknowledge presence or absence of significant depression and/or stress, maximize coping skills, provide positive support system. Participant is able to verbalize types and ability to use techniques and skills needed for reducing stress and depression.  Initial Review & Psychosocial Screening:  Initial Psych Review & Screening - 05/29/22 1312       Initial Review   Current issues with None Identified      Family Dynamics   Good Support System? Yes    Comments His support system includes his wife.      Barriers   Psychosocial barriers to participate in program There are no identifiable barriers or psychosocial needs.      Screening Interventions   Interventions Encouraged to exercise    Expected Outcomes Long Term goal: The participant improves quality of Life and PHQ9 Scores as seen by post scores and/or verbalization of changes;Short Term goal: Identification and review with participant of any Quality of Life or Depression concerns found by scoring  the questionnaire.             Quality of Life Scores:  Quality of Life - 05/29/22 1413       Quality of Life   Select Quality of Life      Quality of Life Scores   Health/Function Pre 22.21 %    Socioeconomic Pre 28.58 %    Psych/Spiritual Pre 24.21 %    Family Pre 28.8 %    GLOBAL Pre 24.94 %            Scores of 19 and below usually indicate a poorer quality of life in these areas.  A difference of  2-3 points is a clinically meaningful difference.  A difference of 2-3 points in the total score of the Quality of Life Index has been associated with significant improvement in overall quality of life, self-image, physical symptoms, and general health in studies assessing change in quality of life.  PHQ-9: Review Flowsheet  More data exists      05/29/2022 05/02/2022 03/12/2022 03/08/2022 02/15/2022  Depression screen PHQ 2/9  Decreased Interest 0 0 0 0 0  Down, Depressed, Hopeless 1 0 0 0 0  PHQ - 2 Score 1 0 0 0 0  Altered sleeping 0 - - - -  Tired, decreased energy 1 - - - -  Change in appetite 1 - - - -  Feeling bad or failure about yourself  0 - - - -  Trouble concentrating 0 - - - -  Moving slowly or fidgety/restless 0 - - - -  Suicidal thoughts 0 - - - -  PHQ-9 Score 3 - - - -  Difficult doing work/chores Not difficult at all - - - -   Interpretation of Total Score  Total Score Depression Severity:  1-4 = Minimal depression, 5-9 = Mild depression, 10-14 = Moderate depression, 15-19 = Moderately severe depression, 20-27 = Severe depression   Psychosocial Evaluation and Intervention:  Psychosocial Evaluation - 05/29/22 1340       Psychosocial Evaluation & Interventions   Interventions Stress management education;Relaxation education    Comments Pt has no barriers to participating in CR. He has no identifiable psychosocial issues. He denies being stressed, but he continually talked about how angry he is with his neighbor due to a land/utility dispute. He scored a  3 on his PHQ-9, and he relates this to his continual recovery from his CABG. His wife is his support system, and they moved into the [place] area about a year ago from [place]. His goals while in the program are to return to his usual activites such as riding his motorcycle and working in his shop. He is eager to start the program.    Expected Outcomes Pt will continue to have no identifiable psychosocial issues.    Continue Psychosocial Services  No Follow up required             Psychosocial Re-Evaluation:   Psychosocial Discharge (Final Psychosocial Re-Evaluation):   Vocational Rehabilitation: Provide vocational rehab assistance to qualifying candidates.   Vocational Rehab Evaluation & Intervention:  Vocational Rehab - 05/29/22 1323       Initial Vocational Rehab Evaluation & Intervention   Assessment shows need for Vocational Rehabilitation No      Vocational Rehab Re-Evaulation   Comments He is retired             Education: Education Goals: Education classes will be provided on a weekly basis, covering required topics. Participant will state understanding/return demonstration of topics presented.  Learning Barriers/Preferences:  Learning Barriers/Preferences - 05/29/22 1319       Learning Barriers/Preferences   Learning Barriers None    Learning Preferences Audio;Computer/Internet;Group Instruction;Individual Instruction;Pictoral;Skilled Demonstration;Verbal Instruction;Video;Written Material             Education Topics: Hypertension, Hypertension Reduction -Define heart disease and high blood pressure. Discus how high blood pressure affects the body and ways to reduce high blood pressure.   Exercise and Your Heart -Discuss why it is important to exercise, the FITT principles of exercise, normal and abnormal responses to exercise, and how to exercise safely.   Angina -Discuss definition of angina, causes of angina, treatment of angina, and how to  decrease risk of having angina.   Cardiac Medications -Review what the following cardiac medications are used for, how they affect the body, and side effects that may occur when taking the medications.  Medications include Aspirin, Beta blockers, calcium channel blockers, ACE Inhibitors, angiotensin receptor blockers, diuretics, digoxin, and antihyperlipidemics.   Congestive Heart Failure -Discuss the definition of CHF, how to live with CHF, the signs and symptoms of CHF, and how keep track of weight and sodium intake.   Heart Disease and Intimacy -Discus the effect sexual activity has on the heart, how changes occur during intimacy as we age, and safety during sexual activity.   Smoking Cessation / COPD -Discuss different methods to quit smoking, the health benefits of quitting smoking, and the definition of COPD.   Nutrition I: Fats -Discuss the types of cholesterol, what cholesterol does to the heart, and how cholesterol levels can be controlled.   Nutrition II: Labels -Discuss the different components of food labels and how to read food label   Heart Parts/Heart Disease and PAD -Discuss the anatomy of the heart, the pathway of blood circulation through the heart, and these are affected by heart disease.   Stress I: Signs and Symptoms -Discuss the causes of stress,  how stress may lead to anxiety and depression, and ways to limit stress.   Stress II: Relaxation -Discuss different types of relaxation techniques to limit stress.   Warning Signs of Stroke / TIA -Discuss definition of a stroke, what the signs and symptoms are of a stroke, and how to identify when someone is having stroke.   Knowledge Questionnaire Score:  Knowledge Questionnaire Score - 05/29/22 1319       Knowledge Questionnaire Score   Pre Score 19/24             Core Components/Risk Factors/Patient Goals at Admission:  Personal Goals and Risk Factors at Admission - 05/29/22 1324       Core  Components/Risk Factors/Patient Goals on Admission   Improve shortness of breath with ADL's Yes    Intervention Provide education, individualized exercise plan and daily activity instruction to help decrease symptoms of SOB with activities of daily living.    Expected Outcomes Short Term: Improve cardiorespiratory fitness to achieve a reduction of symptoms when performing ADLs;Long Term: Be able to perform more ADLs without symptoms or delay the onset of symptoms    Hypertension Yes    Intervention Provide education on lifestyle modifcations including regular physical activity/exercise, weight management, moderate sodium restriction and increased consumption of fresh fruit, vegetables, and low fat dairy, alcohol moderation, and smoking cessation.;Monitor prescription use compliance.    Expected Outcomes Short Term: Continued assessment and intervention until BP is < 140/34m HG in hypertensive participants. < 130/81mHG in hypertensive participants with diabetes, heart failure or chronic kidney disease.;Long Term: Maintenance of blood pressure at goal levels.    Lipids Yes    Intervention Provide education and support for participant on nutrition & aerobic/resistive exercise along with prescribed medications to achieve LDL '70mg'$ , HDL >'40mg'$ .    Expected Outcomes Short Term: Participant states understanding of desired cholesterol values and is compliant with medications prescribed. Participant is following exercise prescription and nutrition guidelines.;Long Term: Cholesterol controlled with medications as prescribed, with individualized exercise RX and with personalized nutrition plan. Value goals: LDL < '70mg'$ , HDL > 40 mg.             Core Components/Risk Factors/Patient Goals Review:    Core Components/Risk Factors/Patient Goals at Discharge (Final Review):    ITP Comments:   Comments: Patient arrived for 1st visit/orientation/education at 1230. Patient was referred to CR by Dr.  HeRoxan Hockeyue to NSTEMI (I21.4) and S/P CABG x 2 (Z95.1). During orientation advised patient on arrival and appointment times what to wear, what to do before, during and after exercise. Reviewed attendance and class policy.  Pt is scheduled to return Cardiac Rehab on 05/31/2022 at 1445. Pt was advised to come to class 15 minutes before class starts.  Discussed RPE/Dpysnea scales. Patient participated in warm up stretches. Patient was able to complete 6 minute walk test.  Telemetry: NSR. Patient was measured for the equipment. Discussed equipment safety with patient. Took patient pre-anthropometric measurements. Patient finished visit at 1400.

## 2022-05-31 ENCOUNTER — Encounter (HOSPITAL_COMMUNITY)
Admission: RE | Admit: 2022-05-31 | Discharge: 2022-05-31 | Disposition: A | Discharge status: Home / Self Care | Payer: Medicare HMO | Source: Ambulatory Visit | Attending: Internal Medicine | Admitting: Internal Medicine

## 2022-05-31 DIAGNOSIS — I214 Non-ST elevation (NSTEMI) myocardial infarction: Secondary | ICD-10-CM

## 2022-05-31 DIAGNOSIS — Z951 Presence of aortocoronary bypass graft: Secondary | ICD-10-CM

## 2022-05-31 LAB — GLUCOSE, CAPILLARY: Glucose-Capillary: 152 mg/dL — ABNORMAL HIGH (ref 70–99)

## 2022-05-31 NOTE — Progress Notes (Signed)
Daily Session Note  Patient Details  Name: Ryan Herrera MRN: [id number] Date of Birth: October 01, 1946 Referring Provider:   Flowsheet Row CARDIAC REHAB PHASE II ORIENTATION from 05/29/2022 in [facility]  Referring Provider Dr. Lucien Mons       Encounter Date: 05/31/2022  Check In:  Session Check In - 05/31/22 1443       Check-In   Supervising physician immediately available to respond to emergencies [facility] MD immediately available    Physician(s) Dr. Phineas Inches    Location [facility]    Staff Present Geanie Cooley, RN;Heather Otho Ket, BS, Exercise Physiologist;Melaney Tellefsen Wynetta Emery, RN, Joanette Gula, RN, BSN    Virtual Visit No    Medication changes reported     No    Fall or balance concerns reported    Yes    Comments He has fallen once in the past year.    Tobacco Cessation No Change    Warm-up and Cool-down Performed as group-led instruction    Resistance Training Performed Yes    VAD Patient? No    PAD/SET Patient? No      Pain Assessment   Currently in Pain? No/denies    Multiple Pain Sites No             Capillary Blood Glucose: No results found for this or any previous visit (from the past 24 hour(s)).    Social History   Tobacco Use  Smoking Status Former   Packs/day: 1.00   Years: 38.00   Total pack years: 38.00   Types: Cigarettes   Quit date: 12/1996   Years since quitting: 25.5  Smokeless Tobacco Never    Goals Met:  Independence with exercise equipment Exercise tolerated well No report of concerns or symptoms today Strength training completed today  Goals Unmet:  Not Applicable  Comments: Check out 1545.   Dr. Carlyle Dolly is Medical Director for [facility] Cardiac [facility]

## 2022-06-03 ENCOUNTER — Encounter (HOSPITAL_COMMUNITY): Payer: Medicare HMO

## 2022-06-04 ENCOUNTER — Telehealth: Payer: Self-pay

## 2022-06-04 NOTE — Telephone Encounter (Signed)
Called pt he states that he is not having issues with urinary retention and someone has something mixed up

## 2022-06-04 NOTE — Telephone Encounter (Signed)
   Telephone encounter was:  Successful.  06/04/2022 Name: ERMINE SPOFFORD MRN: [id number] DOB: 08-Jul-1946  NIAL HAWE is a 76 y.o. year old male who is a primary care patient of Renee Rival, FNP . The community resource team was consulted for assistance with  lift chair  Care guide performed the following interventions: Patient provided with information about care guide support team and interviewed to confirm resource needs.patient needs a lift chair and would like resources.   Follow Up Plan:  Care guide will follow up with patient by phone over the next day    [facility]  [phone] [address], [place], [place] [address] Phone: [phone] Email: [email or web address]

## 2022-06-05 ENCOUNTER — Telehealth: Payer: Self-pay

## 2022-06-05 ENCOUNTER — Encounter (HOSPITAL_COMMUNITY): Payer: Medicare HMO

## 2022-06-05 NOTE — Telephone Encounter (Signed)
[facility], [facility]  [phone] [address], [place], [place] [address] Phone: [phone] Email: [email or web address]

## 2022-06-07 ENCOUNTER — Encounter (HOSPITAL_COMMUNITY)
Admission: RE | Admit: 2022-06-07 | Discharge: 2022-06-07 | Disposition: A | Discharge status: Home / Self Care | Payer: Medicare HMO | Source: Ambulatory Visit | Attending: Internal Medicine | Admitting: Internal Medicine

## 2022-06-07 DIAGNOSIS — I214 Non-ST elevation (NSTEMI) myocardial infarction: Secondary | ICD-10-CM | POA: Diagnosis present

## 2022-06-07 DIAGNOSIS — Z951 Presence of aortocoronary bypass graft: Secondary | ICD-10-CM

## 2022-06-07 LAB — GLUCOSE, CAPILLARY: Glucose-Capillary: 230 mg/dL — ABNORMAL HIGH (ref 70–99)

## 2022-06-07 NOTE — Progress Notes (Signed)
Daily Session Note  Patient Details  Name: Ryan Herrera MRN: [id number] Date of Birth: 01/13/1946 Referring Provider:   Flowsheet Row CARDIAC REHAB PHASE II ORIENTATION from 05/29/2022 in [facility]  Referring Provider Dr. Lucien Mons       Encounter Date: 06/07/2022  Check In:  Session Check In - 06/07/22 1445       Check-In   Supervising physician immediately available to respond to emergencies [facility] MD immediately available    Physician(s) Dr. Debara Pickett    Location [facility]    Staff Present Geanie Cooley, RN;Heather Otho Ket, BS, Exercise Physiologist;Dalton Kris Mouton, MS, ACSM-CEP, Exercise Physiologist    Virtual Visit No    Medication changes reported     No    Fall or balance concerns reported    Yes    Comments He has fallen once in the past year.    Tobacco Cessation No Change    Warm-up and Cool-down Performed as group-led instruction    Resistance Training Performed Yes    VAD Patient? No    PAD/SET Patient? No      Pain Assessment   Currently in Pain? No/denies    Multiple Pain Sites No             Capillary Blood Glucose: Results for orders placed or performed during the hospital encounter of 06/07/22 (from the past 24 hour(s))  Glucose, capillary     Status: Abnormal   Collection Time: 06/07/22  2:42 PM  Result Value Ref Range   Glucose-Capillary 230 (H) 70 - 99 mg/dL      Social History   Tobacco Use  Smoking Status Former   Packs/day: 1.00   Years: 38.00   Total pack years: 38.00   Types: Cigarettes   Quit date: 12/1996   Years since quitting: 25.5  Smokeless Tobacco Never    Goals Met:  Independence with exercise equipment Exercise tolerated well No report of concerns or symptoms today Strength training completed today  Goals Unmet:  Not Applicable  Comments: check ou @ 3:45pm   Dr. Carlyle Dolly is Medical Director for [facility]

## 2022-06-10 ENCOUNTER — Encounter (HOSPITAL_COMMUNITY): Payer: Medicare HMO

## 2022-06-11 ENCOUNTER — Emergency Department (HOSPITAL_COMMUNITY): Payer: No Typology Code available for payment source

## 2022-06-11 ENCOUNTER — Emergency Department (HOSPITAL_COMMUNITY)
Admission: EM | Admit: 2022-06-11 | Discharge: 2022-06-12 | Disposition: A | Discharge status: Home / Self Care | Payer: No Typology Code available for payment source | Attending: Emergency Medicine | Admitting: Emergency Medicine

## 2022-06-11 ENCOUNTER — Other Ambulatory Visit: Payer: Self-pay

## 2022-06-11 ENCOUNTER — Encounter (HOSPITAL_COMMUNITY): Payer: Self-pay

## 2022-06-11 DIAGNOSIS — Z79899 Other long term (current) drug therapy: Secondary | ICD-10-CM | POA: Diagnosis not present

## 2022-06-11 DIAGNOSIS — R109 Unspecified abdominal pain: Secondary | ICD-10-CM | POA: Insufficient documentation

## 2022-06-11 DIAGNOSIS — M549 Dorsalgia, unspecified: Secondary | ICD-10-CM | POA: Diagnosis not present

## 2022-06-11 DIAGNOSIS — R519 Headache, unspecified: Secondary | ICD-10-CM | POA: Insufficient documentation

## 2022-06-11 DIAGNOSIS — M79669 Pain in unspecified lower leg: Secondary | ICD-10-CM | POA: Insufficient documentation

## 2022-06-11 DIAGNOSIS — Y92410 Unspecified street and highway as the place of occurrence of the external cause: Secondary | ICD-10-CM | POA: Insufficient documentation

## 2022-06-11 DIAGNOSIS — Z20822 Contact with and (suspected) exposure to covid-19: Secondary | ICD-10-CM | POA: Diagnosis not present

## 2022-06-11 DIAGNOSIS — R079 Chest pain, unspecified: Secondary | ICD-10-CM | POA: Diagnosis present

## 2022-06-11 LAB — COMPREHENSIVE METABOLIC PANEL
ALT: 19 U/L (ref 0–44)
AST: 21 U/L (ref 15–41)
Albumin: 3.5 g/dL (ref 3.5–5.0)
Alkaline Phosphatase: 67 U/L (ref 38–126)
Anion gap: 8 (ref 5–15)
BUN: 16 mg/dL (ref 8–23)
CO2: 27 mmol/L (ref 22–32)
Calcium: 9.1 mg/dL (ref 8.9–10.3)
Chloride: 103 mmol/L (ref 98–111)
Creatinine, Ser: 1.13 mg/dL (ref 0.61–1.24)
GFR, Estimated: >60 mL/min (ref >60)
Glucose, Bld: 196 mg/dL — ABNORMAL HIGH (ref 70–99)
Potassium: 4.2 mmol/L (ref 3.5–5.1)
Sodium: 138 mmol/L (ref 135–145)
Total Bilirubin: 0.7 mg/dL (ref 0.3–1.2)
Total Protein: 6.9 g/dL (ref 6.5–8.1)

## 2022-06-11 LAB — CBC
HCT: 39 % (ref 39.0–52.0)
Hemoglobin: 12.4 g/dL — ABNORMAL LOW (ref 13.0–17.0)
MCH: 28.5 pg (ref 26.0–34.0)
MCHC: 31.8 g/dL (ref 30.0–36.0)
MCV: 89.7 fL (ref 80.0–100.0)
Platelets: 247 10*3/uL (ref 150–400)
RBC: 4.35 MIL/uL (ref 4.22–5.81)
RDW: 13.9 % (ref 11.5–15.5)
WBC: 5.2 10*3/uL (ref 4.0–10.5)
nRBC: 0 % (ref 0.0–0.2)

## 2022-06-11 LAB — URINALYSIS, ROUTINE W REFLEX MICROSCOPIC
Bacteria, UA: NONE SEEN
Bilirubin Urine: NEGATIVE
Glucose, UA: >=500 mg/dL — AB
Hgb urine dipstick: NEGATIVE
Ketones, ur: NEGATIVE mg/dL
Leukocytes,Ua: NEGATIVE
Nitrite: NEGATIVE
Protein, ur: NEGATIVE mg/dL
Specific Gravity, Urine: 1.024 (ref 1.005–1.030)
pH: 5 (ref 5.0–8.0)

## 2022-06-11 LAB — PROTIME-INR
INR: 1 (ref 0.8–1.2)
Prothrombin Time: 12.6 seconds (ref 11.4–15.2)

## 2022-06-11 LAB — RESP PANEL BY RT-PCR (FLU A&B, COVID) ARPGX2
Influenza A by PCR: NEGATIVE
Influenza B by PCR: NEGATIVE
SARS Coronavirus 2 by RT PCR: NEGATIVE

## 2022-06-11 LAB — SAMPLE TO BLOOD BANK

## 2022-06-11 LAB — LACTIC ACID, PLASMA: Lactic Acid, Venous: 2.4 mmol/L (ref 0.5–1.9)

## 2022-06-11 LAB — ETHANOL: Alcohol, Ethyl (B): <10 mg/dL (ref <10)

## 2022-06-11 MED ORDER — LACTATED RINGERS IV BOLUS
1000.0000 mL | Freq: Once | INTRAVENOUS | Status: AC
  Administered 2022-06-11: 1000 mL via INTRAVENOUS

## 2022-06-11 MED ORDER — ACETAMINOPHEN 500 MG PO TABS
1000.0000 mg | ORAL_TABLET | Freq: Once | ORAL | Status: AC
  Administered 2022-06-11: 1000 mg via ORAL
  Filled 2022-06-11: qty 2

## 2022-06-11 MED ORDER — IOHEXOL 300 MG/ML  SOLN
100.0000 mL | Freq: Once | INTRAMUSCULAR | Status: AC | PRN
  Administered 2022-06-11: 100 mL via INTRAVENOUS

## 2022-06-11 NOTE — ED Notes (Signed)
Date and time results received: 06/11/22 1949  (use smartphrase ".now" to insert current time)  Test: lactic acid Critical Value: 2.4  Name of Provider Notified: Dr Oswald Hillock  Orders Received? Or Actions Taken?:  not at this time

## 2022-06-11 NOTE — ED Notes (Signed)
Pharmacy at bedside

## 2022-06-11 NOTE — ED Notes (Signed)
IV attempted 2x unsuccessful. Another RN to try

## 2022-06-11 NOTE — ED Notes (Signed)
pt refusing IV and fluids at this time. wants to drink PO instead unless CT shows that he cannot drink anything then he will get an IV and fluids. EDP made aware
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
## 2022-06-11 NOTE — ED Notes (Signed)
Patient transported to CT 

## 2022-06-12 ENCOUNTER — Encounter (HOSPITAL_COMMUNITY): Payer: Medicare HMO

## 2022-06-14 ENCOUNTER — Ambulatory Visit (INDEPENDENT_AMBULATORY_CARE_PROVIDER_SITE_OTHER): Payer: Medicare HMO | Admitting: Family Medicine

## 2022-06-14 ENCOUNTER — Encounter: Payer: Self-pay | Admitting: Family Medicine

## 2022-06-14 ENCOUNTER — Encounter (HOSPITAL_COMMUNITY): Payer: Medicare HMO

## 2022-06-14 DIAGNOSIS — Z23 Encounter for immunization: Secondary | ICD-10-CM | POA: Insufficient documentation

## 2022-06-14 DIAGNOSIS — M542 Cervicalgia: Secondary | ICD-10-CM

## 2022-06-14 MED ORDER — CYCLOBENZAPRINE HCL 5 MG PO TABS: 5.0000 mg | ORAL_TABLET | Freq: Three times a day (TID) | ORAL | 0 refills | Status: DC | PRN
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
## 2022-06-14 NOTE — Patient Instructions (Addendum)
I appreciate the opportunity to provide care to you today!    Follow up:  3 months   How can a broken rib cause pneumonia? Not fully inflating your lungs allows the far ends of the lungs to collapse.This makes you more susceptible to developing pneumonia.   Please pick up your medication at the pharmacy Most rib fractures usually heal on their own in 1-3 months. Managing pain, stiffness, and swelling  put ice on the injured area. To do this: Put ice in a plastic bag. Place a towel between your skin and the bag. Leave the ice on for 20 minutes, 2-3 times a day. Take off the ice if your skin turns bright red. This is very important. If you cannot feel pain, heat, or cold, you have a greater risk of damage to the area. Avoid activities that cause pain to the injured area. Protect your injured area. Slowly increase activity   Do deep breathing exercises  Take deep breaths many times a day. Cough several times a day while hugging a pillow. Use a device (incentive spirometer) to do deep breathing many times a day.  Please continue to a heart-healthy diet and increase your physical activities. Try to exercise for 45mns at least three times a week.      It was a pleasure to see you and I look forward to continuing to work together on your health and well-being. Please do not hesitate to call the office if you need care or have questions about your care.   Have a wonderful day and week. With Gratitude, GAlvira MondayMSN, FNP-BC

## 2022-06-14 NOTE — Assessment & Plan Note (Signed)
Patient educated on CDC recommendation for the vaccine. Verbal consent was obtained from the patient, vaccine administered by nurse, no sign of adverse reactions noted at this time. Patient education on arm soreness and use of tylenol or ibuprofen for this patient  was discussed. Patient educated on the signs and symptoms of adverse effect and advise to contact the office if they occur.  

## 2022-06-14 NOTE — Assessment & Plan Note (Addendum)
Informed patient that rib fracture takes 4-12 weeks to heal Encouraged to continue taking Tylenol for pain Flexeril ordered Encouraged pt to continue using his incentive spirometry to prevent pneumonia

## 2022-06-17 ENCOUNTER — Encounter (HOSPITAL_COMMUNITY): Payer: Medicare HMO

## 2022-06-18 ENCOUNTER — Other Ambulatory Visit: Payer: Self-pay | Admitting: Internal Medicine

## 2022-06-18 ENCOUNTER — Telehealth: Payer: Self-pay | Admitting: Family Medicine

## 2022-06-18 NOTE — Telephone Encounter (Signed)
Called in regard to visit on 06/14/22  Patient states that provided would print out total charges with CPT code attached for insurances purposes.  Wants a call back in regard.  Patient was given the number to [facility] dept.

## 2022-06-19 ENCOUNTER — Encounter (HOSPITAL_COMMUNITY): Admission: RE | Admit: 2022-06-19 | Payer: Medicare HMO | Source: Ambulatory Visit

## 2022-06-19 ENCOUNTER — Encounter: Payer: Self-pay | Admitting: Oncology
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
## 2022-06-20 ENCOUNTER — Ambulatory Visit (INDEPENDENT_AMBULATORY_CARE_PROVIDER_SITE_OTHER): Payer: Medicare HMO | Admitting: "Endocrinology

## 2022-06-20 ENCOUNTER — Encounter: Payer: Self-pay | Admitting: "Endocrinology

## 2022-06-20 VITALS — BP 114/66 | HR 72 | Ht 66.5 in | Wt 188.8 lb

## 2022-06-20 DIAGNOSIS — I10 Essential (primary) hypertension: Secondary | ICD-10-CM

## 2022-06-20 DIAGNOSIS — E782 Mixed hyperlipidemia: Secondary | ICD-10-CM | POA: Diagnosis not present

## 2022-06-20 DIAGNOSIS — E1159 Type 2 diabetes mellitus with other circulatory complications: Secondary | ICD-10-CM | POA: Diagnosis not present

## 2022-06-20 DIAGNOSIS — E039 Hypothyroidism, unspecified: Secondary | ICD-10-CM

## 2022-06-20 LAB — POCT GLYCOSYLATED HEMOGLOBIN (HGB A1C): HbA1c, POC (controlled diabetic range): 7.3 % — AB (ref 0.0–7.0)

## 2022-06-20 NOTE — Patient Instructions (Signed)

## 2022-06-20 NOTE — Progress Notes (Unsigned)
06/20/2022, 9:30 PM   Endocrinology follow-up note  Subjective:    Patient ID: Ryan Herrera, male    DOB: 1946-02-23.  XAI FRERKING is being seen in follow-up after he was feeling consultation for management of currently uncontrolled symptomatic diabetes requested by  Alvira Monday, [place].   Past Medical History:  Diagnosis Date   Arthritis    hands   Coronary artery disease    Diabetes mellitus without complication ([name])    GERD (gastroesophageal reflux disease)    H/O poliomyelitis    age 76.  now has "weak stomach muscles"   Hypertension    IBS (irritable bowel syndrome)    Iron deficiency anemia 09/22/2018   Renal insufficiency    Thyroid disease     Past Surgical History:  Procedure Laterality Date   BACK SURGERY  1967   benign tumor removal  2004   CORONARY ARTERY BYPASS GRAFT  2008   [facility] - 3 vessel   CORONARY ARTERY BYPASS GRAFT N/A 03/28/2022   Procedure: REDO CORONARY ARTERY BYPASS GRAFTING (CABG) X2 BYPASSES USING OPEN LEFT RADIAL ARTERY AND ENDOSCOPIC RIGHT GREATER SAPHENOUS VEIN HARVEST;  Surgeon: Melrose Nakayama, MD;  Location: [facility];  Service: Open Heart Surgery;  Laterality: N/A;   CYSTOSCOPY WITH INSERTION OF UROLIFT     HERNIA REPAIR Bilateral    inguinal   HYDROCELE EXCISION / REPAIR     JOINT REPLACEMENT     left knee x2, rright x1   LEFT HEART CATH AND CORS/GRAFTS ANGIOGRAPHY N/A 03/25/2022   Procedure: LEFT HEART CATH AND CORS/GRAFTS ANGIOGRAPHY;  Surgeon: Jettie Booze, MD;  Location: [facility];  Service: Cardiovascular;  Laterality: N/A;   PROSTATE SURGERY N/A 01/2021   TEE WITHOUT CARDIOVERSION N/A 03/28/2022   Procedure: TRANSESOPHAGEAL ECHOCARDIOGRAM (TEE);  Surgeon: Melrose Nakayama, MD;  Location: [facility];  Service: Open Heart Surgery;  Laterality: N/A;   TONSILLECTOMY      Social History   Socioeconomic History   Marital status: Divorced    Spouse name: Not on file    Number of children: 1   Years of education: 12   Highest education level: Master's degree (e.g., MA, MS, MEng, MEd, MSW, MBA)  Occupational History   Occupation: retired  Tobacco Use   Smoking status: Former    Packs/day: 1.00    Years: 38.00    Total pack years: 38.00    Types: Cigarettes    Quit date: 12/1996    Years since quitting: 25.5   Smokeless tobacco: Never  Vaping Use   Vaping Use: Never used  Substance and Sexual Activity   Alcohol use: Yes    Comment: occassionally   Drug use: No   Sexual activity: Not Currently  Other Topics Concern   Not on file  Social History Narrative   Lives with his significant other, pt is retired    Scientist, 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[place]  (11/08/2021)   Overall Financial Resource Strain ([place])    Difficulty of Paying Living Expenses: Not hard at [place]: No [place] (11/08/2021)   Hunger Vital Sign    Worried About [place] in the Last Year: Never true    [place] in the Last Year: Never true  Transportation Needs: No Transportation Needs (11/08/2021)   PRAPARE - Transportation    Lack of Transportation (Medical): No    Lack  of Transportation (Non-Medical): No  Physical Activity: Inactive (11/08/2021)   Exercise Vital Sign    Days of Exercise per Week: 0 days    Minutes of Exercise per Session: 0 min  Stress: No Stress Concern Present (11/08/2021)   [place]    Feeling of Stress : Not at all  Social Connections: Socially Isolated (11/08/2021)   Social Connection and Isolation Panel [NHANES]    Frequency of Communication with Friends and Family: More than three times a week    Frequency of Social Gatherings with Friends and Family: Once a week    Attends Religious Services: Never    Marine scientist or Organizations: No    Attends Music therapist: Never    Marital Status: Divorced     Family History  Problem Relation Age of Onset   Hypertension Mother    Heart attack Mother    Osteoarthritis Mother    Stroke Father    Hypertension Father    Stroke Sister    Stroke Sister    Colon cancer Neg Hx    Prostate cancer Neg Hx    Cancer - Lung Neg Hx     Outpatient Encounter Medications as of 06/20/2022  Medication Sig   acetaminophen (TYLENOL) 650 MG CR tablet Take 1,300 mg by mouth every 8 (eight) hours as needed for pain.   allopurinol (ZYLOPRIM) 100 MG tablet Take 1 tablet (100 mg total) by mouth daily.   amoxicillin (AMOXIL) 500 MG capsule Take 2,000 mg by mouth See admin instructions. Take 2000 mg 1 hour prior to dental work   aspirin EC 81 MG tablet Take 81 mg by mouth daily.   BD PEN NEEDLE NANO U/F 32G X 4 MM MISC    Betamethasone Dipropionate Aug (DIPROLENE EX) Apply 1 Application topically daily as needed (rash).   Carboxymethylcellulose Sodium (THERATEARS PF OP) Place 1 drop into both eyes daily as needed (dry eyes).   Cholecalciferol (VITAMIN D) 50 MCG (2000 UT) tablet Take 2,000 Units by mouth daily.   clotrimazole-betamethasone (LOTRISONE) cream Apply 1 Application topically 2 (two) times daily as needed (rash).   cyanocobalamin (VITAMIN B12) 1000 MCG tablet Take 1,000 mcg by mouth daily.   cyclobenzaprine (FLEXERIL) 5 MG tablet Take 1 tablet (5 mg total) by mouth 3 (three) times daily as needed for muscle spasms.   Evolocumab (REPATHA SURECLICK) 902 MG/ML SOAJ Inject 140 mg into the skin every 14 (fourteen) days.   ezetimibe (ZETIA) 10 MG tablet Take 1 tablet (10 mg total) by mouth daily.   glipiZIDE (GLUCOTROL XL) 5 MG 24 hr tablet TAKE 1 TABLET BY MOUTH EVERY DAY WITH BREAKFAST (Patient taking differently: Take 5 mg by mouth daily with breakfast.)   glucose blood test strip 1 each by Other route as needed. Use to check blood glucose twice daily as instructed   hydrochlorothiazide (HYDRODIURIL) 25 MG tablet Take 1 tablet (25 mg total) by mouth daily.    insulin degludec (TRESIBA FLEXTOUCH) 100 UNIT/ML FlexTouch Pen Inject 20 Units into the skin at bedtime.   ketoconazole (NIZORAL) 2 % shampoo Apply 1 Application topically See admin instructions. Uses a small amount with every shower   levothyroxine (SYNTHROID) 100 MCG tablet TAKE 1 TABLET BY MOUTH DAILY BEFORE BREAKFAST.   Lidocaine HCl (LIDOCAINE PLUS EX) Apply 1 Application topically daily as needed (itching/pain).   metFORMIN (GLUCOPHAGE) 1000 MG tablet Take 1,000 mg by mouth 2 (two) times daily with a  meal.   metoprolol succinate (TOPROL-XL) 25 MG 24 hr tablet Take 1 tablet (25 mg total) by mouth daily.   omeprazole (PRILOSEC) 20 MG capsule TAKE 1 CAPSULE BY MOUTH EVERY DAY   pantoprazole (PROTONIX) 40 MG tablet Take 1 tablet (40 mg total) by mouth daily.   ranolazine (RANEXA) 500 MG 12 hr tablet TAKE 1 TABLET BY MOUTH TWICE A DAY   tadalafil (CIALIS) 5 MG tablet Take 5 mg by mouth daily.   triamcinolone cream (KENALOG) 0.5 % Apply 1 Application topically 2 (two) times daily as needed (tick bites).   UNABLE TO FIND Lift chair DX: M47.812   UNABLE TO FIND Blood pressure cuff DX: I10   valACYclovir (VALTREX) 500 MG tablet Take 500 mg by mouth 2 (two) times daily.   [DISCONTINUED] JARDIANCE 10 MG TABS tablet Take 1 tablet (10 mg total) by mouth every morning.   [DISCONTINUED] rosuvastatin (CRESTOR) 5 MG tablet Take 5 mg by mouth daily.   No facility-administered encounter medications on file as of 06/20/2022.    ALLERGIES: Allergies  Allergen Reactions   Myrbetriq [Mirabegron] Nausea And Vomiting   Jardiance [Empagliflozin]    Niacin And Related Itching   Nsaids     Kidney damage    Other Diarrhea and Nausea And Vomiting    PT does not want OPIOIDS   GLP-1 drugs - Nausea, vomiting, diarrhea, belching   Statins Itching    VACCINATION STATUS: Immunization History  Administered Date(s) Administered   Fluad Quad(high Dose 65+) 06/14/2022   Influenza Split 07/19/2013    Influenza, High Dose Seasonal PF 06/13/2015, 07/18/2016, 06/11/2017, 07/08/2018, 07/02/2019, 09/15/2020, 07/27/2021   Influenza,inj,Quad PF,6+ Mos 08/08/2014   Influenza-Unspecified 07/15/2012, 07/15/2013, 08/08/2014, 06/13/2015, 07/18/2016   PFIZER Comirnaty(Gray Top)Covid-19 Tri-Sucrose Vaccine 11/27/2019, 12/19/2019, 07/26/2020, 04/20/2021   PPD Test 01/22/2013, 01/22/2013   Pfizer Covid-19 Vaccine Bivalent Booster 65yr & up 07/27/2021   Pneumococcal Conjugate-13 05/04/2014   Pneumococcal Polysaccharide-23 07/15/2012   Td 02/15/2003   Tdap 05/04/2014, 03/09/2022   Zoster Recombinat (Shingrix) 02/12/2017, 02/13/2017, 06/04/2017    Diabetes He presents for his follow-up diabetic visit. He has type 2 diabetes mellitus. Onset time: He was diagnosed at approximate age of 89years. His disease course has been improving. There are no hypoglycemic associated symptoms. Pertinent negatives for hypoglycemia include no confusion, headaches, pallor or seizures. Pertinent negatives for diabetes include no chest pain, no fatigue, no polydipsia, no polyphagia, no polyuria and no weakness. There are no hypoglycemic complications. Symptoms are improving. Diabetic complications include heart disease. Risk factors for coronary artery disease include dyslipidemia, diabetes mellitus, family history, obesity, male sex, hypertension, tobacco exposure and sedentary lifestyle. Current diabetic treatment includes insulin injections (He is currently on Tresiba 30 units daily, metformin 1000 mg p.o. twice daily, glipizide 10 mg p.o. twice daily, Jardiance 10 mg p.o. once a day.). His weight is increasing steadily. He is following a generally unhealthy diet. When asked about meal planning, he reported none. He rarely participates in exercise. His home blood glucose trend is decreasing steadily. His breakfast blood glucose range is generally 140-180 mg/dl. His overall blood glucose range is 140-180 mg/dl. (Gwyndolyn Saxonpresents with  improved glycemic profile and point-of-care A1c was 7.3%.  His average blood glucose is 145-168 mg per DL.  No hypoglycemia  documented or reported.  ) An ACE inhibitor/angiotensin II receptor blocker is being taken.  Hyperlipidemia This is a chronic problem. The current episode started more than 1 year ago. The problem is controlled. Exacerbating diseases include diabetes.  Pertinent negatives include no chest pain, myalgias or shortness of breath. Treatments tried: He is on Repatha injection as well as Zetia 10 mg p.o. daily. Risk factors for coronary artery disease include dyslipidemia, diabetes mellitus, family history, obesity, male sex, hypertension and a sedentary lifestyle.  Hypertension This is a chronic problem. The current episode started more than 1 year ago. Pertinent negatives include no chest pain, headaches, neck pain, palpitations or shortness of breath. Risk factors for coronary artery disease include family history, dyslipidemia, diabetes mellitus, male gender, obesity and sedentary lifestyle. Past treatments include angiotensin blockers. Hypertensive end-organ damage includes CAD/MI.     Review of Systems  Constitutional:  Negative for chills, fatigue, fever and unexpected weight change.  HENT:  Negative for dental problem, mouth sores and trouble swallowing.   Eyes:  Negative for visual disturbance.  Respiratory:  Negative for cough, choking, chest tightness, shortness of breath and wheezing.   Cardiovascular:  Negative for chest pain, palpitations and leg swelling.  Gastrointestinal:  Negative for abdominal distention, abdominal pain, constipation, diarrhea, nausea and vomiting.  Endocrine: Negative for polydipsia, polyphagia and polyuria.  Genitourinary:  Negative for dysuria, flank pain, hematuria and urgency.  Musculoskeletal:  Negative for back pain, gait problem, myalgias and neck pain.  Skin:  Negative for pallor, rash and wound.  Neurological:  Negative for seizures,  syncope, weakness, numbness and headaches.  Psychiatric/Behavioral:  Negative for confusion and dysphoric mood.     Objective:       06/20/2022    1:17 PM 06/14/2022    1:53 PM 06/12/2022   12:00 AM  Vitals with BMI  Height 5' 6.5" 5' 6.5"   Weight 188 lbs 13 oz 189 lbs 2 oz   BMI 50.53 97.67   Systolic 341 937 902  Diastolic 66 68 70  Pulse 72 74 72    BP 114/66   Pulse 72   Ht 5' 6.5" (1.689 m)   Wt 188 lb 12.8 oz (85.6 kg)   BMI 30.02 kg/m   Wt Readings from Last 3 Encounters:  06/20/22 188 lb 12.8 oz (85.6 kg)  06/14/22 189 lb 1.9 oz (85.8 kg)  06/11/22 181 lb (82.1 kg)       CMP ( most recent) CMP     Component Value Date/Time   NA 138 06/11/2022 1859   NA 139 03/12/2022 1310   NA 139 04/29/2014 1500   K 4.2 06/11/2022 1859   K 4.2 04/29/2014 1500   CL 103 06/11/2022 1859   CL 103 04/29/2014 1500   CO2 27 06/11/2022 1859   CO2 27 04/29/2014 1500   GLUCOSE 196 (H) 06/11/2022 1859   GLUCOSE 111 (H) 04/29/2014 1500   BUN 16 06/11/2022 1859   BUN 19 03/12/2022 1310   BUN 16 04/29/2014 1500   CREATININE 1.13 06/11/2022 1859   CREATININE 1.14 04/29/2014 1500   CALCIUM 9.1 06/11/2022 1859   CALCIUM 9.2 04/29/2014 1500   PROT 6.9 06/11/2022 1859   PROT 6.1 03/12/2022 1310   ALBUMIN 3.5 06/11/2022 1859   ALBUMIN 3.9 03/12/2022 1310   AST 21 06/11/2022 1859   ALT 19 06/11/2022 1859   ALKPHOS 67 06/11/2022 1859   BILITOT 0.7 06/11/2022 1859   BILITOT 0.3 03/12/2022 1310   GFRNONAA >60 06/11/2022 1859   GFRNONAA >60 04/29/2014 1500   GFRAA >60 05/12/2020 1848   GFRAA >60 04/29/2014 1500     Diabetic Labs (most recent): Lab Results  Component Value Date   HGBA1C 7.3 (A)  06/20/2022   HGBA1C 7.6 (H) 03/23/2022   HGBA1C 7.6 (A) 03/14/2022     Lipid Panel ( most recent) Lipid Panel     Component Value Date/Time   CHOL 120 03/12/2022 1310   TRIG 94 03/12/2022 1310   HDL 39 (L) 03/12/2022 1310   CHOLHDL 3.1 03/12/2022 1310   LDLCALC 63 03/12/2022  1310   LABVLDL 18 03/12/2022 1310      Lab Results  Component Value Date   TSH 2.349 03/24/2022   TSH 5.760 (H) 03/12/2022   TSH 4.124 12/21/2018   TSH 5.545 (H) 09/22/2018   TSH 4.84 (H) 04/29/2014   FREET4 1.14 (H) 03/24/2022   FREET4 1.01 12/21/2018      Assessment & Plan:   1. DM type 2 causing vascular disease ([place])  - Suzan Garibaldi has currently uncontrolled symptomatic type 2 DM since  76 years of age. Sadarius presents with improved glycemic profile and point-of-care A1c was 7.3%.  His average blood glucose is 145-168 mg per DL.  No hypoglycemia documented or reported.    Recent labs reviewed. His average blood glucose of 168 in his meter. - I had a long discussion with him about the progressive nature of diabetes and the pathology behind its complications. -his diabetes is complicated by coronary artery disease status post cardiac bypass in 2008, recurrent coronary artery disease, obesity/sedentary life, comorbid hypertension /hyperlipidemia, history of smoking and he remains at a high risk for more acute and chronic complications which include CAD, CVA, CKD, retinopathy, and neuropathy. These are all discussed in detail with him.  - I discussed all available options of managing his diabetes including de-escalation of medications. I have counseled him on diet  and weight management  by adopting a Whole Food , Plant Predominant  ( WFPP) nutrition as recommended by [employer] of Lifestyle Medicine. Patient is encouraged to switch to  unprocessed or minimally processed  complex starch, adequate protein intake (mainly plant source), minimal liquid fat ( mainly vegetable oils), plenty of fruits, and vegetables. -  he is advised to stick to a routine mealtimes to eat 3 complete meals a day and snack only when necessary ( to snack only to correct hypoglycemia BG <70 day time or <100 at night).   - he acknowledges that there is a room for improvement in his food and drink  choices. - Suggestion is made for him to avoid simple carbohydrates  from his diet including Cakes, Sweet Desserts, Ice Cream, Soda (diet and regular), Sweet Tea, Candies, Chips, Cookies, Store Bought Juices, Alcohol , Artificial Sweeteners,  Coffee Creamer, and "Sugar-free" Products, Lemonade. This will help patient to have more stable blood glucose profile and potentially avoid unintended weight gain.  The following Lifestyle Medicine recommendations according to [facility]) were discussed and and offered to patient and he  agrees to start the journey:  A. Whole Foods, Plant-Based Nutrition comprising of fruits and vegetables, plant-based proteins, whole-grain carbohydrates was discussed in detail with the patient.   A list for source of those nutrients were also provided to the patient.  Patient will use only water or unsweetened tea for hydration. B.  The need to stay away from risky substances including alcohol, smoking; obtaining 7 to 9 hours of restorative sleep, at least 150 minutes of moderate intensity exercise weekly, the importance of healthy social connections,  and stress management techniques were discussed. C.  A full color page of  Calorie density of  various food groups per pound showing examples of each food groups was provided to the patient.   - he will be scheduled with Jearld Fenton, RDN, CDE for individualized diabetes education.  - I have approached him with the following plan to manage  his diabetes and patient agrees:   -Based on his presentation, he will not need prandial insulin for now, advised to continue Tresiba 20 units nightly, associated with monitoring of blood glucose twice a day-daily before breakfast and at bedtime.   He is advised to continue metformin 1000 mg p.o. twice daily, advised continue glipizide 5 mg p.o. daily.  - He wishes to come off of Jardiance due to urology complications. - he is warned not to take insulin  without proper monitoring per orders.  - he is encouraged to call clinic for blood glucose levels less than 70 or above 150 mg /dl fasting x3/week.   - Specific targets for  A1c;  LDL, HDL,  and Triglycerides were discussed with the patient.  2) Blood Pressure /Hypertension:   His blood pressure is controlled to 100.  he is advised to continue his current medications including losartan 25 mg p.o. daily with breakfast .   3) Lipids/Hyperlipidemia:   Review of his recent lipid panel showed  controlled  LDL at 63 .  He does not tolerate statins.  He is advised to continue Repatha 140 mg every 14 days, Zetia 10 mg every night.   Side effects and precautions discussed with him.  If he indeed engages enough for  whole food , plant-based diet, he had a chance to come off of these antilipid medications.  4)  Weight/Diet:  Body mass index is 30.02 kg/m.  -   clearly complicating his diabetes care.   he is  a candidate for weight loss. I discussed with him the fact that loss of 5 - 10% of his  current body weight will have the most impact on his diabetes management.  The above detailed  ACLM recommendations for nutrition, exercise, sleep, social life, avoidance of risky substances, the need for restorative sleep   information will also detailed on discharge instructions.  5) Chronic Care/Health Maintenance:  -he  is on ARB  medications and  is encouraged to initiate and continue to follow up with Ophthalmology, Dentist,  Podiatrist at least yearly or according to recommendations, and advised to   stay away from smoking. I have recommended yearly flu vaccine and pneumonia vaccine at least every 5 years; moderate intensity exercise for up to 150 minutes weekly; and  sleep for 7- 9 hours a day.   6) hypothyroidism: Circumstance of diagnosis not available to review.  His recent TSH was slightly above target.  He is currently on levothyroxine 100 mcg p.o. daily before breakfast.  He is advised to continue.    - We discussed about the correct intake of his thyroid hormone, on empty stomach at fasting, with water, separated by at least 30 minutes from breakfast and other medications,  and separated by more than 4 hours from calcium, iron, multivitamins, acid reflux medications (PPIs). -Patient is made aware of the fact that thyroid hormone replacement is needed for life, dose to be adjusted by periodic monitoring of thyroid function tests.   - he is  advised to maintain close follow up with Alvira Monday, FNP for primary care needs, as well as his other providers for optimal and coordinated care.   I spent 32 minutes in the care of the patient  today including review of labs from [place], Lipids, Thyroid Function, Hematology (current and previous including abstractions from other facilities); face-to-face time discussing  his blood glucose readings/logs, discussing hypoglycemia and hyperglycemia episodes and symptoms, medications doses, his options of short and long term treatment based on the latest standards of care / guidelines;  discussion about incorporating lifestyle medicine;  and documenting the encounter. Risk reduction counseling performed per USPSTF guidelines to reduce  obesity and cardiovascular risk factors.     Please refer to Patient Instructions for Blood Glucose Monitoring and Insulin/Medications Dosing Guide"  in media tab for additional information. Please  also refer to " Patient Self Inventory" in the Media  tab for reviewed elements of pertinent patient history.  Suzan Garibaldi participated in the discussions, expressed understanding, and voiced agreement with the above plans.  All questions were answered to his satisfaction. he is encouraged to contact clinic should he have any questions or concerns prior to his return visit.    Follow up plan: - Return in about 4 months (around 10/20/2022) for F/U with Pre-visit Labs, Meter/CGM/Logs, A1c here.  Glade Lloyd, MD [facility] [address] [place], [place] [address] Phone: [phone]  Fax: [phone]    06/20/2022, 9:30 PM  This note was partially dictated with voice recognition software. Similar sounding words can be transcribed inadequately or may not  be corrected upon review.

## 2022-06-21 ENCOUNTER — Encounter: Payer: Self-pay | Admitting: Internal Medicine

## 2022-06-21 ENCOUNTER — Ambulatory Visit: Payer: Medicare HMO | Attending: Internal Medicine | Admitting: Internal Medicine

## 2022-06-21 ENCOUNTER — Encounter (HOSPITAL_COMMUNITY): Payer: Medicare HMO

## 2022-06-21 ENCOUNTER — Other Ambulatory Visit: Payer: Self-pay | Admitting: "Endocrinology

## 2022-06-21 VITALS — BP 118/68 | HR 84 | Ht 66.5 in | Wt 187.0 lb

## 2022-06-21 DIAGNOSIS — I2510 Atherosclerotic heart disease of native coronary artery without angina pectoris: Secondary | ICD-10-CM | POA: Diagnosis not present

## 2022-06-21 NOTE — Patient Instructions (Signed)
Medication Instructions:  Your physician recommends that you continue on your current medications as directed. Please refer to the Current Medication list given to you today.  *If you need a refill on your cardiac medications before your next appointment, please call your pharmacy*   Follow-Up: At [facility], you and your health needs are our priority.  As part of our continuing mission to provide you with exceptional heart care, we have created designated Provider Care Teams.  These Care Teams include your primary Cardiologist (physician) and Advanced Practice Providers (APPs -  Physician Assistants and Nurse Practitioners) who all work together to provide you with the care you need, when you need it.  We recommend signing up for the patient portal called "MyChart".  Sign up information is provided on this After Visit Summary.  MyChart is used to connect with patients for Virtual Visits (Telemedicine).  Patients are able to view lab/test results, encounter notes, upcoming appointments, etc.  Non-urgent messages can be sent to your provider as well.   To learn more about what you can do with MyChart, go to [email or web address].    Your next appointment:   12 month(s)  The format for your next appointment:   In Person  Provider:   Janina Mayo, MD
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
## 2022-06-24 ENCOUNTER — Encounter (HOSPITAL_COMMUNITY): Admission: RE | Admit: 2022-06-24 | Payer: Medicare HMO | Source: Ambulatory Visit

## 2022-06-25 ENCOUNTER — Ambulatory Visit (INDEPENDENT_AMBULATORY_CARE_PROVIDER_SITE_OTHER): Payer: Medicare HMO | Admitting: Family Medicine

## 2022-06-25 ENCOUNTER — Encounter: Payer: Self-pay | Admitting: Family Medicine

## 2022-06-25 ENCOUNTER — Telehealth: Payer: Self-pay | Admitting: "Endocrinology

## 2022-06-25 DIAGNOSIS — M542 Cervicalgia: Secondary | ICD-10-CM

## 2022-06-25 NOTE — Progress Notes (Deleted)
Established Patient Office Visit  Subjective:  Patient ID: Ryan Herrera, male    DOB: 1946-01-05  Age: 76 y.o. MRN: [id number]  CC:  Chief Complaint  Patient presents with   Neck Pain    Pt states his neck pain is not any better from last visit, pain level is a 10/10. Patient has been using tylenol but has not been helping much. Neck is worse when he turns his head side to side.     HPI    Past Medical History:  Diagnosis Date   Arthritis    hands   Coronary artery disease    Diabetes mellitus without complication ([name])    GERD (gastroesophageal reflux disease)    H/O poliomyelitis    age 1.  now has "weak stomach muscles"   Hypertension    IBS (irritable bowel syndrome)    Iron deficiency anemia 09/22/2018   Renal insufficiency    Thyroid disease     Past Surgical History:  Procedure Laterality Date   BACK SURGERY  1967   benign tumor removal  2004   CORONARY ARTERY BYPASS GRAFT  2008   [facility] - 3 vessel   CORONARY ARTERY BYPASS GRAFT N/A 03/28/2022   Procedure: REDO CORONARY ARTERY BYPASS GRAFTING (CABG) X2 BYPASSES USING OPEN LEFT RADIAL ARTERY AND ENDOSCOPIC RIGHT GREATER SAPHENOUS VEIN HARVEST;  Surgeon: Melrose Nakayama, MD;  Location: [place];  Service: Open Heart Surgery;  Laterality: N/A;   CYSTOSCOPY WITH INSERTION OF UROLIFT     HERNIA REPAIR Bilateral    inguinal   HYDROCELE EXCISION / REPAIR     JOINT REPLACEMENT     left knee x2, rright x1   LEFT HEART CATH AND CORS/GRAFTS ANGIOGRAPHY N/A 03/25/2022   Procedure: LEFT HEART CATH AND CORS/GRAFTS ANGIOGRAPHY;  Surgeon: Jettie Booze, MD;  Location: [place] [facility];  Service: Cardiovascular;  Laterality: N/A;   PROSTATE SURGERY N/A 01/2021   TEE WITHOUT CARDIOVERSION N/A 03/28/2022   Procedure: TRANSESOPHAGEAL ECHOCARDIOGRAM (TEE);  Surgeon: Melrose Nakayama, MD;  Location: [place];  Service: Open Heart Surgery;  Laterality: N/A;   TONSILLECTOMY      Family History  Problem  Relation Age of Onset   Hypertension Mother    Heart attack Mother    Osteoarthritis Mother    Stroke Father    Hypertension Father    Stroke Sister    Stroke Sister    Colon cancer Neg Hx    Prostate cancer Neg Hx    Cancer - Lung Neg Hx     Social History   Socioeconomic History   Marital status: Divorced    Spouse name: Not on file   Number of children: 1   Years of education: 12   Highest education level: Master's degree (e.g., MA, MS, MEng, MEd, MSW, MBA)  Occupational History   Occupation: retired  Tobacco Use   Smoking status: Former    Packs/day: 1.00    Years: 38.00    Total pack years: 38.00    Types: Cigarettes    Quit date: 12/1996    Years since quitting: 25.5   Smokeless tobacco: Never  Vaping Use   Vaping Use: Never used  Substance and Sexual Activity   Alcohol use: Yes    Comment: occassionally   Drug use: No   Sexual activity: Not Currently  Other Topics Concern   Not on file  Social History Narrative   Lives with his significant other, pt is retired  Social Determinants of Health   Financial Resource Strain: Low Risk  (11/08/2021)   Overall Financial Resource Strain (CARDIA)    Difficulty of Paying Living Expenses: Not hard at all  Food Insecurity: No Food Insecurity (11/08/2021)   Hunger Vital Sign    Worried About Running Out of Food in the Last Year: Never true    Ran Out of Food in the Last Year: Never true  Transportation Needs: No Transportation Needs (11/08/2021)   PRAPARE - Hydrologist (Medical): No    Lack of Transportation (Non-Medical): No  Physical Activity: Inactive (11/08/2021)   Exercise Vital Sign    Days of Exercise per Week: 0 days    Minutes of Exercise per Session: 0 min  Stress: No Stress Concern Present (11/08/2021)   [place]    Feeling of Stress : Not at all  Social Connections: Socially Isolated (11/08/2021)   Social  Connection and Isolation Panel [NHANES]    Frequency of Communication with Friends and Family: More than three times a week    Frequency of Social Gatherings with Friends and Family: Once a week    Attends Religious Services: Never    Marine scientist or Organizations: No    Attends Archivist Meetings: Never    Marital Status: Divorced  Human resources officer Violence: Not At Risk (11/08/2021)   Humiliation, Afraid, Rape, and Kick questionnaire    Fear of Current or Ex-Partner: No    Emotionally Abused: No    Physically Abused: No    Sexually Abused: No    Outpatient Medications Prior to Visit  Medication Sig Dispense Refill   acetaminophen (TYLENOL) 650 MG CR tablet Take 1,300 mg by mouth every 8 (eight) hours as needed for pain.     allopurinol (ZYLOPRIM) 100 MG tablet Take 1 tablet (100 mg total) by mouth daily. 30 tablet 3   amoxicillin (AMOXIL) 500 MG capsule Take 2,000 mg by mouth See admin instructions. Take 2000 mg 1 hour prior to dental work     aspirin EC 81 MG tablet Take 81 mg by mouth daily.     BD PEN NEEDLE NANO U/F 32G X 4 MM MISC      Betamethasone Dipropionate Aug (DIPROLENE EX) Apply 1 Application topically daily as needed (rash).     Carboxymethylcellulose Sodium (THERATEARS PF OP) Place 1 drop into both eyes daily as needed (dry eyes).     Cholecalciferol (VITAMIN D) 50 MCG (2000 UT) tablet Take 2,000 Units by mouth daily.     clotrimazole-betamethasone (LOTRISONE) cream Apply 1 Application topically 2 (two) times daily as needed (rash).     cyanocobalamin (VITAMIN B12) 1000 MCG tablet Take 1,000 mcg by mouth daily.     cyclobenzaprine (FLEXERIL) 5 MG tablet Take 1 tablet (5 mg total) by mouth 3 (three) times daily as needed for muscle spasms. 30 tablet 0   Evolocumab (REPATHA SURECLICK) 235 MG/ML SOAJ Inject 140 mg into the skin every 14 (fourteen) days. 2 mL 3   ezetimibe (ZETIA) 10 MG tablet Take 1 tablet (10 mg total) by mouth daily. 90 tablet 3    glipiZIDE (GLUCOTROL XL) 5 MG 24 hr tablet TAKE 1 TABLET BY MOUTH EVERY DAY WITH BREAKFAST 90 tablet 0   glucose blood test strip 1 each by Other route as needed. Use to check blood glucose twice daily as instructed 100 each 2   hydrochlorothiazide (HYDRODIURIL) 25 MG tablet  Take 1 tablet (25 mg total) by mouth daily. 90 tablet 3   insulin degludec (TRESIBA FLEXTOUCH) 100 UNIT/ML FlexTouch Pen Inject 20 Units into the skin at bedtime. 15 mL 1   ketoconazole (NIZORAL) 2 % shampoo Apply 1 Application topically See admin instructions. Uses a small amount with every shower     levothyroxine (SYNTHROID) 100 MCG tablet TAKE 1 TABLET BY MOUTH DAILY BEFORE BREAKFAST. 90 tablet 0   Lidocaine HCl (LIDOCAINE PLUS EX) Apply 1 Application topically daily as needed (itching/pain).     metFORMIN (GLUCOPHAGE) 1000 MG tablet Take 1,000 mg by mouth 2 (two) times daily with a meal.     metoprolol succinate (TOPROL-XL) 25 MG 24 hr tablet Take 1 tablet (25 mg total) by mouth daily. 90 tablet 3   omeprazole (PRILOSEC) 20 MG capsule TAKE 1 CAPSULE BY MOUTH EVERY DAY 90 capsule 1   pantoprazole (PROTONIX) 40 MG tablet Take 1 tablet (40 mg total) by mouth daily. 90 tablet 3   ranolazine (RANEXA) 500 MG 12 hr tablet TAKE 1 TABLET BY MOUTH TWICE A DAY 60 tablet 3   tadalafil (CIALIS) 5 MG tablet Take 5 mg by mouth daily.     triamcinolone cream (KENALOG) 0.5 % Apply 1 Application topically 2 (two) times daily as needed (tick bites).     UNABLE TO FIND Lift chair DX: M47.812 1 each 0   UNABLE TO FIND Blood pressure cuff DX: I10 1 each 0   valACYclovir (VALTREX) 500 MG tablet Take 500 mg by mouth 2 (two) times daily.     No facility-administered medications prior to visit.    Allergies  Allergen Reactions   Myrbetriq [Mirabegron] Nausea And Vomiting   Jardiance [Empagliflozin]    Niacin And Related Itching   Nsaids     Kidney damage    Other Diarrhea and Nausea And Vomiting    PT does not want OPIOIDS   GLP-1 drugs  - Nausea, vomiting, diarrhea, belching   Statins Itching    ROS Review of Systems    Objective:    Physical Exam  There were no vitals taken for this visit. Wt Readings from Last 3 Encounters:  06/21/22 187 lb (84.8 kg)  06/20/22 188 lb 12.8 oz (85.6 kg)  06/14/22 189 lb 1.9 oz (85.8 kg)    Lab Results  Component Value Date   TSH 2.349 03/24/2022   Lab Results  Component Value Date   WBC 5.2 06/11/2022   HGB 12.4 (L) 06/11/2022   HCT 39.0 06/11/2022   MCV 89.7 06/11/2022   PLT 247 06/11/2022   Lab Results  Component Value Date   NA 138 06/11/2022   K 4.2 06/11/2022   CO2 27 06/11/2022   GLUCOSE 196 (H) 06/11/2022   BUN 16 06/11/2022   CREATININE 1.13 06/11/2022   BILITOT 0.7 06/11/2022   ALKPHOS 67 06/11/2022   AST 21 06/11/2022   ALT 19 06/11/2022   PROT 6.9 06/11/2022   ALBUMIN 3.5 06/11/2022   CALCIUM 9.1 06/11/2022   ANIONGAP 8 06/11/2022   EGFR 73 03/12/2022   Lab Results  Component Value Date   CHOL 120 03/12/2022   Lab Results  Component Value Date   HDL 39 (L) 03/12/2022   Lab Results  Component Value Date   LDLCALC 63 03/12/2022   Lab Results  Component Value Date   TRIG 94 03/12/2022   Lab Results  Component Value Date   CHOLHDL 3.1 03/12/2022   Lab Results  Component Value  Date   HGBA1C 7.3 (A) 06/20/2022      Assessment & Plan:   Problem List Items Addressed This Visit   None   No orders of the defined types were placed in this encounter.   Follow-up: No follow-ups on file.    Alvira Monday, FNP
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
## 2022-06-25 NOTE — Telephone Encounter (Signed)
New message    The patient called blood sugar reading - call after 2 pm .   9/17 196-198   9/18 165--168 something   9/19 196

## 2022-06-25 NOTE — Telephone Encounter (Signed)
F/u   Patient calling back on blood sugar reading

## 2022-06-26 ENCOUNTER — Encounter (HOSPITAL_COMMUNITY): Payer: Medicare HMO

## 2022-06-26 NOTE — Telephone Encounter (Signed)
Discussed with pt, understanding voiced. 

## 2022-06-26 NOTE — Telephone Encounter (Signed)
Pt states he's taking glipizide '5mg'$  daily, tresiba 20 units qhs and metformin '1000mg'$  bid. States urologist discontinued his jardiance.

## 2022-06-27 NOTE — Addendum Note (Signed)
Encounter addended by: Philis Kendall on: 06/27/2022 3:22 PM  Actions taken: Flowsheet accepted

## 2022-06-28 ENCOUNTER — Encounter (HOSPITAL_COMMUNITY): Payer: Medicare HMO

## 2022-07-01 ENCOUNTER — Encounter (HOSPITAL_COMMUNITY): Payer: Medicare HMO

## 2022-07-03 ENCOUNTER — Other Ambulatory Visit: Payer: Self-pay

## 2022-07-03 ENCOUNTER — Encounter (HOSPITAL_COMMUNITY): Payer: Medicare HMO

## 2022-07-03 ENCOUNTER — Telehealth: Payer: Self-pay

## 2022-07-03 DIAGNOSIS — M542 Cervicalgia: Secondary | ICD-10-CM

## 2022-07-03 NOTE — Telephone Encounter (Signed)
Spoke with pt agreed for me to send a new referral to [facility], hopefully he can get in sooner with them.

## 2022-07-03 NOTE — Telephone Encounter (Signed)
Patient called can not get in until October 18 for therapy for neck pain with [facility]. Patient needs sooner appointment if you have to send him somewhere else. Call patient at [phone]

## 2022-07-04 ENCOUNTER — Other Ambulatory Visit: Payer: Self-pay | Admitting: "Endocrinology

## 2022-07-05 ENCOUNTER — Encounter (HOSPITAL_COMMUNITY): Payer: Medicare HMO

## 2022-07-08 ENCOUNTER — Encounter (HOSPITAL_COMMUNITY): Payer: Medicare HMO

## 2022-07-08 NOTE — Telephone Encounter (Signed)
Patient called in regard to Physical Therapy.  Patient states that he is looking to get into physical therapy earlier than oct 18. Looking to have new referral place to get in sooner. Patient states that he was not looking for pain management he just wanted physical therapy.  Patient wants a call back.

## 2022-07-09 NOTE — Telephone Encounter (Signed)
Please place a referral to physical therapy

## 2022-07-09 NOTE — Addendum Note (Signed)
Encounter addended by: Philis Kendall on: 07/09/2022 12:42 PM  Actions taken: Flowsheet accepted

## 2022-07-10 ENCOUNTER — Encounter (HOSPITAL_COMMUNITY): Payer: Medicare HMO

## 2022-07-10 ENCOUNTER — Other Ambulatory Visit: Payer: Self-pay | Admitting: Nurse Practitioner

## 2022-07-10 ENCOUNTER — Other Ambulatory Visit: Payer: Self-pay

## 2022-07-10 DIAGNOSIS — M542 Cervicalgia: Secondary | ICD-10-CM

## 2022-07-10 DIAGNOSIS — E039 Hypothyroidism, unspecified: Secondary | ICD-10-CM

## 2022-07-10 NOTE — Telephone Encounter (Signed)
Referral placed.

## 2022-07-10 NOTE — Telephone Encounter (Signed)
Patient wants Physical Therapy referral placed at [facility] on [address] Linna Hoff   Fax# [phone].  Patient would like call back when referral placed.

## 2022-07-10 NOTE — Addendum Note (Signed)
Encounter addended by: Dwana Melena, RN on: 07/10/2022 12:50 PM  Actions taken: Flowsheet data copied forward, Flowsheet accepted

## 2022-07-12 ENCOUNTER — Encounter (HOSPITAL_COMMUNITY): Payer: Medicare HMO

## 2022-07-12 ENCOUNTER — Encounter (HOSPITAL_COMMUNITY)
Admission: RE | Admit: 2022-07-12 | Discharge: 2022-07-12 | Disposition: A | Discharge status: Home / Self Care | Payer: Medicare HMO | Source: Ambulatory Visit | Attending: Internal Medicine | Admitting: Internal Medicine

## 2022-07-12 DIAGNOSIS — Z951 Presence of aortocoronary bypass graft: Secondary | ICD-10-CM | POA: Insufficient documentation

## 2022-07-12 DIAGNOSIS — I214 Non-ST elevation (NSTEMI) myocardial infarction: Secondary | ICD-10-CM | POA: Diagnosis present

## 2022-07-12 NOTE — Progress Notes (Signed)
Daily Session Note  Patient Details  Name: Ryan Herrera MRN: [id number] Date of Birth: 11/27/1945 Referring Provider:   Flowsheet Row CARDIAC REHAB PHASE II ORIENTATION from 05/29/2022 in [facility]  Referring Provider Dr. Hendrickerson       Encounter Date: 07/12/2022  Check In:  Session Check In - 07/12/22 1442       Check-In   Supervising physician immediately available to respond to emergencies [facility] MD immediately available    Physician(s) Dr Ross    Location [facility]    Staff Present Heather Jachimiak, BS, Exercise Physiologist;Daphyne Martin, RN, BSN    Virtual Visit No    Medication changes reported     No    Fall or balance concerns reported    Yes    Comments He has fallen once in the past year.    Tobacco Cessation No Change    Warm-up and Cool-down Performed as group-led instruction    Resistance Training Performed Yes    VAD Patient? No    PAD/SET Patient? No      Pain Assessment   Currently in Pain? No/denies    Multiple Pain Sites No             Capillary Blood Glucose: No results found for this or any previous visit (from the past 24 hour(s)).    Social History   Tobacco Use  Smoking Status Former   Packs/day: 1.00   Years: 38.00   Total pack years: 38.00   Types: Cigarettes   Quit date: 12/1996   Years since quitting: 25.6  Smokeless Tobacco Never    Goals Met:  Independence with exercise equipment Exercise tolerated well No report of concerns or symptoms today Strength training completed today  Goals Unmet:  Not Applicable  Comments: Checkout at 1545.   Dr. Jonathan Branch is Medical Director for [facility] 

## 2022-07-15 ENCOUNTER — Encounter (HOSPITAL_COMMUNITY)
Admission: RE | Admit: 2022-07-15 | Discharge: 2022-07-15 | Disposition: A | Discharge status: Home / Self Care | Payer: Medicare HMO | Source: Ambulatory Visit | Attending: Internal Medicine | Admitting: Internal Medicine

## 2022-07-15 VITALS — Wt 192.9 lb

## 2022-07-15 DIAGNOSIS — Z951 Presence of aortocoronary bypass graft: Secondary | ICD-10-CM

## 2022-07-15 DIAGNOSIS — I214 Non-ST elevation (NSTEMI) myocardial infarction: Secondary | ICD-10-CM | POA: Diagnosis not present

## 2022-07-15 NOTE — Progress Notes (Signed)
Daily Session Note  Patient Details  Name: SHUBH CHIARA MRN: [id number] Date of Birth: 16-Jul-1946 Referring Provider:   Flowsheet Row CARDIAC REHAB PHASE II ORIENTATION from 05/29/2022 in [place]  Referring Provider Dr. Lucien Mons       Encounter Date: 07/15/2022  Check In:  Session Check In - 07/15/22 1442       Check-In   Supervising physician immediately available to respond to emergencies [facility] MD immediately available    Physician(s) Dr Radford Pax    Location [facility]    Staff Present Oney Tatlock Hassell Done, RN, Bjorn Loser, MS, ACSM-CEP, Exercise Physiologist    Virtual Visit No    Medication changes reported     No    Fall or balance concerns reported    Yes    Comments He has fallen once in the past year.    Tobacco Cessation No Change    Warm-up and Cool-down Performed as group-led instruction    Resistance Training Performed Yes    VAD Patient? No    PAD/SET Patient? No      Pain Assessment   Currently in Pain? No/denies    Multiple Pain Sites No             Capillary Blood Glucose: No results found for this or any previous visit (from the past 24 hour(s)).    Social History   Tobacco Use  Smoking Status Former   Packs/day: 1.00   Years: 38.00   Total pack years: 38.00   Types: Cigarettes   Quit date: 12/1996   Years since quitting: 25.6  Smokeless Tobacco Never    Goals Met:  Independence with exercise equipment Exercise tolerated well No report of concerns or symptoms today Strength training completed today  Goals Unmet:  Not Applicable  Comments: Checkout at 1545.   Dr. Carlyle Dolly is Medical Director for [facility]

## 2022-07-17 ENCOUNTER — Ambulatory Visit (INDEPENDENT_AMBULATORY_CARE_PROVIDER_SITE_OTHER): Payer: Medicare HMO | Admitting: Internal Medicine

## 2022-07-17 ENCOUNTER — Encounter (HOSPITAL_COMMUNITY): Payer: Medicare HMO

## 2022-07-17 ENCOUNTER — Ambulatory Visit (HOSPITAL_COMMUNITY)
Admission: RE | Admit: 2022-07-17 | Discharge: 2022-07-17 | Disposition: A | Discharge status: Home / Self Care | Payer: Medicare HMO | Source: Ambulatory Visit | Attending: Internal Medicine | Admitting: Internal Medicine

## 2022-07-17 ENCOUNTER — Encounter: Payer: Self-pay | Admitting: Internal Medicine

## 2022-07-17 VITALS — BP 140/76 | HR 73 | Temp 98.2°F | Resp 17 | Ht 66.5 in | Wt 189.0 lb

## 2022-07-17 DIAGNOSIS — R059 Cough, unspecified: Secondary | ICD-10-CM | POA: Insufficient documentation

## 2022-07-17 DIAGNOSIS — R051 Acute cough: Secondary | ICD-10-CM
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
## 2022-07-17 NOTE — Progress Notes (Signed)
     CC: dry cough  HPI:[name] is a 76 y.o. male who presents for evaluation of dry cough. For the details of today's visit, please refer to the assessment and plan.   Filed Weights   07/17/22 1434  Weight: 189 lb (85.7 kg)     Past Medical History:  Diagnosis Date   Arthritis    hands   Coronary artery disease    Diabetes mellitus without complication ([name])    GERD (gastroesophageal reflux disease)    H/O poliomyelitis    age 18.  now has "weak stomach muscles"   Hypertension    IBS (irritable bowel syndrome)    Iron deficiency anemia 09/22/2018   Renal insufficiency    Thyroid disease     Review of Systems:    Review of Systems  Constitutional:  Negative for chills, fever and malaise/fatigue.  Respiratory:  Positive for cough. Negative for sputum production.   Cardiovascular:  Negative for chest pain and leg swelling.     Physical Exam: Vitals:   07/17/22 1434  BP: (!) 140/76  Pulse: 73  Resp: 17  Temp: 98.2 F (36.8 C)  TempSrc: Oral  SpO2: 94%  Weight: 189 lb (85.7 kg)  Height: 5' 6.5" (1.689 m)     Physical Exam Constitutional:      General: He is not in acute distress.    Appearance: He is not ill-appearing.  Cardiovascular:     Rate and Rhythm: Normal rate and regular rhythm.     Heart sounds: No murmur heard.    Comments: JVP 9cm Pulmonary:     Effort: Pulmonary effort is normal.     Breath sounds: Rales present. No wheezing or rhonchi.  Skin:    General: Skin is warm and dry.  Neurological:     Mental Status: He is alert.      Assessment & Plan:   Cough Patient presents with cough which started 3 days ago. It is constant and worsened with lying down. He denies any bad taste in his mouth or burning sensation. The cough is non productive. He sleeps on one pillow. Denies PND. No lower extremity edema or chest pain. Has some occasional shortness of breath, but not having any difficulty breathing at this time. Would like  to be tested for Covid. He hasn't been around anyone sick. He had NSTEMI in June and CABG performed at [facility]. This was his second CABG, previous CABG at [facility] in 2008.   Assessment/Plan: Acute dry cough, most likely from pulmonary congestion. We will test for Covid, but no other symptoms to suggest infectious etiology. Sending for chest xray for better evaluation, checking BNP, and BMP. Will start Lasix if xray confirms pulmonary congestion.     Lorene Dy, MD

## 2022-07-17 NOTE — Patient Instructions (Signed)
Thank you for trusting me with your care. To recap, today we discussed the following:   Acute cough - Basic metabolic panel - DG Chest 2 View - B Nat Peptide  I will call this afternoon if result are back by 6 or I will call tomorrow.

## 2022-07-17 NOTE — Assessment & Plan Note (Addendum)
Patient presents with cough which started 3 days ago. It is constant and worsened with lying down. He denies any bad taste in his mouth or burning sensation. The cough is non productive. He sleeps on one pillow. Denies PND. No lower extremity edema or chest pain. Has some occasional shortness of breath, but not having any difficulty breathing at this time. Would like to be tested for Covid. He hasn't been around anyone sick. He had NSTEMI in June and CABG performed at [facility]. This was his second CABG, previous CABG at [facility] in 2008.   Assessment/Plan: Acute dry cough, most likely from pulmonary congestion. We will test for Covid, but no other symptoms to suggest infectious etiology. Sending for chest xray for better evaluation, checking BNP, and BMP. Will start Lasix if xray confirms pulmonary congestion.

## 2022-07-18 ENCOUNTER — Telehealth: Payer: Self-pay | Admitting: Family Medicine

## 2022-07-18 ENCOUNTER — Telehealth: Payer: Self-pay | Admitting: Internal Medicine

## 2022-07-18 DIAGNOSIS — R051 Acute cough: Secondary | ICD-10-CM

## 2022-07-18 MED ORDER — GUAIFENESIN ER 600 MG PO TB12: 600.0000 mg | ORAL_TABLET | Freq: Two times a day (BID) | ORAL | 0 refills | Status: DC

## 2022-07-18 MED ORDER — DEXTROMETHORPHAN HBR 15 MG/5ML PO SYRP: 10.0000 mL | ORAL_SOLUTION | Freq: Four times a day (QID) | ORAL | 0 refills | Status: DC | PRN

## 2022-07-18 NOTE — Telephone Encounter (Signed)
Called patient and reviewed chest xray results with him. No pleural effusions or consolidation. Covid test pending and BNP. Patient cough productive over night and coughing up green phlegm. May be early viral respiratory infection and recommend symptom management.Will send in medications below to pharmacy. Patient will make appointment for evaluation if not improving.   - dextromethorphan 15 MG/5ML syrup; Take 10 mLs (30 mg total) by mouth 4 (four) times daily as needed for cough.  Dispense: 120 mL; Refill: 0 - guaiFENesin (MUCINEX) 600 MG 12 hr tablet; Take 1 tablet (600 mg total) by mouth 2 (two) times daily.  Dispense: 14 tablet; Refill: 0

## 2022-07-18 NOTE — Telephone Encounter (Signed)
Pt called wanting to know the results of his cxr & covid test. Can you please look into this?   Seen Dr. Court Joy yesterday 07/17/22

## 2022-07-19 ENCOUNTER — Encounter (HOSPITAL_COMMUNITY): Payer: Medicare HMO

## 2022-07-19 LAB — BASIC METABOLIC PANEL
BUN/Creatinine Ratio: 13 (ref 10–24)
BUN: 12 mg/dL (ref 8–27)
CO2: 22 mmol/L (ref 20–29)
Calcium: 9.5 mg/dL (ref 8.6–10.2)
Chloride: 99 mmol/L (ref 96–106)
Creatinine, Ser: 0.95 mg/dL (ref 0.76–1.27)
Glucose: 268 mg/dL — ABNORMAL HIGH (ref 70–99)
Potassium: 4 mmol/L (ref 3.5–5.2)
Sodium: 136 mmol/L (ref 134–144)
eGFR: 82 mL/min/{1.73_m2} (ref >59)

## 2022-07-19 LAB — NOVEL CORONAVIRUS, NAA: SARS-CoV-2, NAA: NOT DETECTED

## 2022-07-19 LAB — BRAIN NATRIURETIC PEPTIDE: BNP: 64.2 pg/mL (ref 0.0–100.0)

## 2022-07-19 NOTE — Telephone Encounter (Signed)
Results given by provider.

## 2022-07-22 ENCOUNTER — Telehealth: Payer: Self-pay | Admitting: Family Medicine

## 2022-07-22 ENCOUNTER — Encounter (HOSPITAL_COMMUNITY): Payer: Medicare HMO
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
## 2022-07-22 NOTE — Telephone Encounter (Signed)
Pt has questions about the results, since impression showed enlarged thyroid glands states no one has went over it with him.

## 2022-07-22 NOTE — Telephone Encounter (Signed)
Pt called stating he received a letter Friday evening stating he needed to speak urgently to his provider about some imaging that was done. Wants to know if you can please look into this & give him a call?

## 2022-07-22 NOTE — Telephone Encounter (Signed)
Tried to reach pt back unable to get in touch.

## 2022-07-22 NOTE — Telephone Encounter (Signed)
Pt would like his CT of head reviewed, received a letter in the mail instructing him to give his pcp a call.

## 2022-07-23 ENCOUNTER — Other Ambulatory Visit: Payer: Self-pay | Admitting: Family Medicine

## 2022-07-23 DIAGNOSIS — B9689 Other specified bacterial agents as the cause of diseases classified elsewhere: Secondary | ICD-10-CM

## 2022-07-23 DIAGNOSIS — E049 Nontoxic goiter, unspecified: Secondary | ICD-10-CM

## 2022-07-23 MED ORDER — AMOXICILLIN-POT CLAVULANATE 875-125 MG PO TABS: 1.0000 | ORAL_TABLET | Freq: Two times a day (BID) | ORAL | 0 refills | Status: AC

## 2022-07-23 NOTE — Telephone Encounter (Signed)
Called and reviewed CT scan of the head result with patient.  Patient also complained that his symptoms has not resolved since seen Dr. Luiz Ochoa on 07/17/2022.  He complains of nasal congestion cough with green phlegm.  We will send in Augmentin and treat for 5 days given the duration of his symptoms.  Encouraged to call back if his symptoms does not resolve after antibiotic treatment.

## 2022-07-23 NOTE — Telephone Encounter (Signed)
CT scan reviewed with patient.  Augmentin sent to his pharmacy to treat acute bacterial sinusitis since his symptoms have now resolved.

## 2022-07-24 ENCOUNTER — Encounter (HOSPITAL_COMMUNITY): Payer: Medicare HMO

## 2022-07-24 ENCOUNTER — Ambulatory Visit (HOSPITAL_COMMUNITY): Payer: No Typology Code available for payment source

## 2022-07-26 ENCOUNTER — Encounter (HOSPITAL_COMMUNITY): Payer: Medicare HMO

## 2022-07-29 ENCOUNTER — Encounter (HOSPITAL_COMMUNITY)
Admission: RE | Admit: 2022-07-29 | Discharge: 2022-07-29 | Disposition: A | Discharge status: Home / Self Care | Payer: Medicare HMO | Source: Ambulatory Visit | Attending: Internal Medicine | Admitting: Internal Medicine

## 2022-07-29 DIAGNOSIS — I214 Non-ST elevation (NSTEMI) myocardial infarction: Secondary | ICD-10-CM | POA: Diagnosis not present

## 2022-07-29 NOTE — Progress Notes (Signed)
Daily Session Note  Patient Details  Name: Ryan Herrera MRN: [id number] Date of Birth: Mar 26, 1946 Referring Provider:   Flowsheet Row CARDIAC REHAB PHASE II ORIENTATION from 05/29/2022 in [place]  Referring Provider Dr. Lucien Mons       Encounter Date: 07/29/2022  Check In:   Capillary Blood Glucose: No results found for this or any previous visit (from the past 24 hour(s)).    Social History   Tobacco Use  Smoking Status Former   Packs/day: 1.00   Years: 38.00   Total pack years: 38.00   Types: Cigarettes   Quit date: 12/1996   Years since quitting: 25.6  Smokeless Tobacco Never    Goals Met:  Independence with exercise equipment Exercise tolerated well No report of concerns or symptoms today Strength training completed today  Goals Unmet:  Not Applicable  Comments: check out 1545   Dr. Carlyle Dolly is Medical Director for [place]

## 2022-07-30 ENCOUNTER — Ambulatory Visit (HOSPITAL_COMMUNITY)
Admission: RE | Admit: 2022-07-30 | Discharge: 2022-07-30 | Disposition: A | Discharge status: Home / Self Care | Payer: Medicare HMO | Source: Ambulatory Visit | Attending: Family Medicine | Admitting: Family Medicine

## 2022-07-30 DIAGNOSIS — E049 Nontoxic goiter, unspecified: Secondary | ICD-10-CM | POA: Diagnosis present

## 2022-07-31 ENCOUNTER — Other Ambulatory Visit: Payer: Self-pay | Admitting: Family Medicine

## 2022-07-31 ENCOUNTER — Ambulatory Visit (HOSPITAL_COMMUNITY): Payer: Medicare HMO

## 2022-07-31 ENCOUNTER — Telehealth: Payer: Self-pay | Admitting: Family Medicine

## 2022-07-31 ENCOUNTER — Other Ambulatory Visit: Payer: Self-pay | Admitting: "Endocrinology

## 2022-07-31 ENCOUNTER — Telehealth: Payer: Self-pay | Admitting: "Endocrinology

## 2022-07-31 ENCOUNTER — Encounter (HOSPITAL_COMMUNITY)
Admission: RE | Admit: 2022-07-31 | Discharge: 2022-07-31 | Disposition: A | Discharge status: Home / Self Care | Payer: Medicare HMO | Source: Ambulatory Visit | Attending: Internal Medicine | Admitting: Internal Medicine

## 2022-07-31 DIAGNOSIS — I214 Non-ST elevation (NSTEMI) myocardial infarction: Secondary | ICD-10-CM

## 2022-07-31 DIAGNOSIS — E042 Nontoxic multinodular goiter: Secondary | ICD-10-CM

## 2022-07-31 DIAGNOSIS — Z951 Presence of aortocoronary bypass graft: Secondary | ICD-10-CM

## 2022-07-31 NOTE — Telephone Encounter (Signed)
I called and spoke with the patient and reviewed the ultrasound results.  Informed patient of FNA procedure and referral placed to endocrinology.  He reports that 2 years ago, he had a biopsy of his thyroid nodules with benign findings.  The patient is currently not having thyroid nodule symptoms.  He denies difficulty swallowing, hoarseness, pain in the neck, swelling in the neck, and cold intolerance.

## 2022-07-31 NOTE — Progress Notes (Unsigned)
Mir, Ryan Libra, MD  Donita Brooks D Approved for US guided FNA.   Nodule 3 (TI-RADS 3), measuring 2.8 cm, located in the inferior right thyroid lobe, meets criteria for FNA.

## 2022-07-31 NOTE — Progress Notes (Signed)
Daily Session Note  Patient Details  Name: Ryan Herrera MRN: [id number] Date of Birth: 12/20/1945 Referring Provider:   Flowsheet Row CARDIAC REHAB PHASE II ORIENTATION from 05/29/2022 in [place]  Referring Provider Dr. Lucien Mons       Encounter Date: 07/31/2022  Check In:  Session Check In - 07/31/22 1440       Check-In   Supervising physician immediately available to respond to emergencies [facility] MD immediately available    Physician(s) Dr. Marlou Porch    Location [facility]    Staff Present Geanie Cooley, RN;Heather Otho Ket, BS, Exercise Physiologist;Daphyne Hassell Done, RN, BSN;Mamie Hundertmark BSN, RN    Virtual Visit No    Medication changes reported     No    Fall or balance concerns reported    Yes    Comments He has fallen once in the past year.    Tobacco Cessation No Change    Warm-up and Cool-down Performed as group-led instruction    Resistance Training Performed Yes    VAD Patient? No    PAD/SET Patient? No      Pain Assessment   Currently in Pain? No/denies    Multiple Pain Sites No             Capillary Blood Glucose: No results found for this or any previous visit (from the past 24 hour(s)).    Social History   Tobacco Use  Smoking Status Former   Packs/day: 1.00   Years: 38.00   Total pack years: 38.00   Types: Cigarettes   Quit date: 12/1996   Years since quitting: 25.6  Smokeless Tobacco Never    Goals Met:  Independence with exercise equipment Exercise tolerated well No report of concerns or symptoms today Strength training completed today  Goals Unmet:  Not Applicable  Comments: check out at 3:45   Dr. Carlyle Dolly is Medical Director for [place]

## 2022-07-31 NOTE — Telephone Encounter (Signed)
Noted  

## 2022-07-31 NOTE — Telephone Encounter (Signed)
Dr Dorris Fetch I received a note from [facility] that this patient had a thyroid nodule. His next follow up is not until January with you. Do you want to see him sooner in reference to this? Please advise and I can call the patient

## 2022-08-02 ENCOUNTER — Encounter (HOSPITAL_COMMUNITY)
Admission: RE | Admit: 2022-08-02 | Discharge: 2022-08-02 | Disposition: A | Discharge status: Home / Self Care | Payer: Medicare HMO | Source: Ambulatory Visit | Attending: Internal Medicine | Admitting: Internal Medicine

## 2022-08-02 DIAGNOSIS — I214 Non-ST elevation (NSTEMI) myocardial infarction: Secondary | ICD-10-CM | POA: Diagnosis not present

## 2022-08-02 DIAGNOSIS — Z951 Presence of aortocoronary bypass graft: Secondary | ICD-10-CM

## 2022-08-02 NOTE — Progress Notes (Signed)
Daily Session Note  Patient Details  Name: Ryan Herrera MRN: [id number] Date of Birth: 1945/12/12 Referring Provider:   Flowsheet Row CARDIAC REHAB PHASE II ORIENTATION from 05/29/2022 in [place]  Referring Provider Dr. Lucien Mons       Encounter Date: 08/02/2022  Check In:  Session Check In - 08/02/22 1445       Check-In   Supervising physician immediately available to respond to emergencies [facility] MD immediately available    Physician(s) Dr. Harl Bowie    Location [facility]    Staff Present Leana Roe, BS, Exercise Physiologist;Debra Wynetta Emery, RN, BSN;Lakeysha Slutsky, RN;Dalton Sherrie George, MS, ACSM-CEP    Virtual Visit No    Medication changes reported     No    Fall or balance concerns reported    Yes    Comments He has fallen once in the past year.    Tobacco Cessation No Change    Warm-up and Cool-down Performed as group-led instruction    Resistance Training Performed Yes    VAD Patient? No    PAD/SET Patient? No      Pain Assessment   Currently in Pain? No/denies    Multiple Pain Sites No             Capillary Blood Glucose: No results found for this or any previous visit (from the past 24 hour(s)).    Social History   Tobacco Use  Smoking Status Former   Packs/day: 1.00   Years: 38.00   Total pack years: 38.00   Types: Cigarettes   Quit date: 12/1996   Years since quitting: 25.6  Smokeless Tobacco Never    Goals Met:  Independence with exercise equipment Exercise tolerated well No report of concerns or symptoms today Strength training completed today  Goals Unmet:  Not Applicable  Comments: check out @ 3:45pm   Dr. Carlyle Dolly is Medical Director for [place]

## 2022-08-03 ENCOUNTER — Other Ambulatory Visit: Payer: Self-pay | Admitting: Family Medicine

## 2022-08-05 ENCOUNTER — Encounter (HOSPITAL_COMMUNITY): Payer: Medicare HMO

## 2022-08-05 ENCOUNTER — Other Ambulatory Visit: Payer: Self-pay | Admitting: "Endocrinology

## 2022-08-07 ENCOUNTER — Telehealth: Payer: Self-pay | Admitting: Internal Medicine

## 2022-08-07 ENCOUNTER — Encounter (HOSPITAL_COMMUNITY)
Admission: RE | Admit: 2022-08-07 | Discharge: 2022-08-07 | Disposition: A | Discharge status: Home / Self Care | Payer: Medicare HMO | Source: Ambulatory Visit | Attending: Internal Medicine | Admitting: Internal Medicine

## 2022-08-07 DIAGNOSIS — Z951 Presence of aortocoronary bypass graft: Secondary | ICD-10-CM | POA: Insufficient documentation

## 2022-08-07 DIAGNOSIS — I214 Non-ST elevation (NSTEMI) myocardial infarction: Secondary | ICD-10-CM | POA: Insufficient documentation

## 2022-08-07 NOTE — Telephone Encounter (Signed)
Pt c/o BP issue: STAT if pt c/o blurred vision, one-sided weakness or slurred speech  1. What are your last 5 BP readings?  Started at 116/64 and is now up to 162/78, per cardiac rehab   2. Are you having any other symptoms (ex. Dizziness, headache, blurred vision, passed out)?  No   3. What is your BP issue?   Patient states his BP is rising and cardiac rehab advised to follow up with Dr. Harl Bowie.

## 2022-08-07 NOTE — Progress Notes (Signed)
Daily Session Note  Patient Details  Name: Ryan Herrera MRN: [id number] Date of Birth: 08-13-1946 Referring Provider:   Flowsheet Row CARDIAC REHAB PHASE II ORIENTATION from 05/29/2022 in [place]  Referring Provider Dr. Lucien Mons       Encounter Date: 08/07/2022  Check In:  Session Check In - 08/07/22 1444       Check-In   Supervising physician immediately available to respond to emergencies [facility] MD immediately available    Physician(s) Dr. Domenic Polite    Location [facility]    Staff Present Leana Roe, BS, Exercise Physiologist;Dalton Sherrie George, MS, ACSM-CEP;Madelyn Flavors, RN, BSN    Virtual Visit No    Medication changes reported     No    Fall or balance concerns reported    Yes    Comments He has fallen once in the past year.    Tobacco Cessation No Change    Warm-up and Cool-down Performed as group-led instruction    Resistance Training Performed Yes    VAD Patient? No    PAD/SET Patient? No      Pain Assessment   Currently in Pain? No/denies    Multiple Pain Sites No             Capillary Blood Glucose: No results found for this or any previous visit (from the past 24 hour(s)).    Social History   Tobacco Use  Smoking Status Former   Packs/day: 1.00   Years: 38.00   Total pack years: 38.00   Types: Cigarettes   Quit date: 12/1996   Years since quitting: 25.6  Smokeless Tobacco Never    Goals Met:  Independence with exercise equipment Exercise tolerated well No report of concerns or symptoms today Strength training completed today  Goals Unmet:  Not Applicable  Comments: check out 1545   Dr. Carlyle Dolly is Medical Director for [place]

## 2022-08-07 NOTE — Telephone Encounter (Signed)
Patient stated his SBP has been increasing, especially during and after cardiac rehab (even with resting breaks). BP ranges from 100/60 to 162/78. Today at rehab 116/64 and 162/78. Patient is taking medications as prescribed. He state he no longer takes ranolazine because he does not have chest pain. Education on decreasing caffeine and sodium in diet. Patient stated he is having thyroid BX tomorrow. Please advise on increasing SBP. Should ranolazine be removed from med list?

## 2022-08-08 ENCOUNTER — Encounter (HOSPITAL_COMMUNITY): Payer: Self-pay

## 2022-08-08 ENCOUNTER — Ambulatory Visit (HOSPITAL_COMMUNITY)
Admission: RE | Admit: 2022-08-08 | Discharge: 2022-08-08 | Disposition: A | Discharge status: Home / Self Care | Payer: Medicare HMO | Source: Ambulatory Visit | Attending: Family Medicine | Admitting: Family Medicine

## 2022-08-08 VITALS — BP 162/80 | HR 73 | Temp 98.0°F | Resp 18

## 2022-08-08 DIAGNOSIS — R896 Abnormal cytological findings in specimens from other organs, systems and tissues: Secondary | ICD-10-CM | POA: Diagnosis not present

## 2022-08-08 DIAGNOSIS — E042 Nontoxic multinodular goiter: Secondary | ICD-10-CM

## 2022-08-08 DIAGNOSIS — E041 Nontoxic single thyroid nodule: Secondary | ICD-10-CM | POA: Diagnosis present

## 2022-08-08 MED ORDER — LIDOCAINE HCL (PF) 2 % IJ SOLN: 10.0000 mL | Freq: Once | INTRAMUSCULAR | Status: AC

## 2022-08-08 MED ORDER — LIDOCAINE HCL (PF) 2 % IJ SOLN
INTRAMUSCULAR | Status: AC
  Filled 2022-08-08: qty 10

## 2022-08-08 MED ORDER — LIDOCAINE HCL (PF) 2 % IJ SOLN
INTRAMUSCULAR | Status: AC
  Administered 2022-08-08: 10 mL
  Filled 2022-08-08: qty 10

## 2022-08-08 NOTE — Progress Notes (Signed)
PT tolerated thyroid biopsy procedure well today. Labs and afirma obtained and sent for pathology. PT ambulatory at discharge with no acute distress noted and verbalized understanding of discharge instructions. 

## 2022-08-09 ENCOUNTER — Encounter (HOSPITAL_COMMUNITY)
Admission: RE | Admit: 2022-08-09 | Discharge: 2022-08-09 | Disposition: A | Discharge status: Home / Self Care | Payer: Medicare HMO | Source: Ambulatory Visit | Attending: Internal Medicine | Admitting: Internal Medicine

## 2022-08-09 ENCOUNTER — Telehealth: Payer: Self-pay | Admitting: Family Medicine

## 2022-08-09 DIAGNOSIS — Z951 Presence of aortocoronary bypass graft: Secondary | ICD-10-CM

## 2022-08-09 DIAGNOSIS — I214 Non-ST elevation (NSTEMI) myocardial infarction: Secondary | ICD-10-CM

## 2022-08-09 NOTE — Telephone Encounter (Signed)
Patient called today and said the note in his chart is not correct. Date of service 06/25/22.  He said the date is 2006 not 2019. And same for the medicine issue.

## 2022-08-09 NOTE — Telephone Encounter (Signed)
Ryan Herrera called and said his Back surg and pain med issue was 2006 not 2019.  Please correct note DOS 06/25/22 per patient

## 2022-08-09 NOTE — Progress Notes (Signed)
Daily Session Note  Patient Details  Name: Ryan Herrera MRN: [id number] Date of Birth: August 28, 1946 Referring Provider:   Flowsheet Row CARDIAC REHAB PHASE II ORIENTATION from 05/29/2022 in [facility]  Referring Provider Dr. Lucien Mons       Encounter Date: 08/09/2022  Check In:  Session Check In - 08/09/22 1448       Check-In   Supervising physician immediately available to respond to emergencies [facility] MD immediately available    Physician(s) Dr. Harl Bowie    Staff Present Leana Roe, BS, Exercise Physiologist;Daphyne Hassell Done, RN, BSN;Lamarco Gudiel, RN    Virtual Visit No    Medication changes reported     No    Fall or balance concerns reported    Yes    Comments He has fallen once in the past year.    Tobacco Cessation No Change    Warm-up and Cool-down Performed as group-led instruction    Resistance Training Performed Yes    VAD Patient? No    PAD/SET Patient? No      Pain Assessment   Currently in Pain? No/denies    Multiple Pain Sites No             Capillary Blood Glucose: No results found for this or any previous visit (from the past 24 hour(s)).    Social History   Tobacco Use  Smoking Status Former   Packs/day: 1.00   Years: 38.00   Total pack years: 38.00   Types: Cigarettes   Quit date: 12/1996   Years since quitting: 25.6  Smokeless Tobacco Never    Goals Met:  Independence with exercise equipment Exercise tolerated well No report of concerns or symptoms today Strength training completed today  Goals Unmet:  Not Applicable  Comments: check out @ 3:45pm   Dr. Carlyle Dolly is Medical Director for [place]

## 2022-08-11 NOTE — Telephone Encounter (Signed)
Will call and speak with the patient to verify and update the notes and his surgical history as needed.

## 2022-08-12 ENCOUNTER — Encounter (HOSPITAL_COMMUNITY)
Admission: RE | Admit: 2022-08-12 | Discharge: 2022-08-12 | Disposition: A | Discharge status: Home / Self Care | Payer: Medicare HMO | Source: Ambulatory Visit | Attending: Internal Medicine | Admitting: Internal Medicine

## 2022-08-12 VITALS — Wt 193.1 lb

## 2022-08-12 DIAGNOSIS — I214 Non-ST elevation (NSTEMI) myocardial infarction: Secondary | ICD-10-CM | POA: Diagnosis not present

## 2022-08-12 DIAGNOSIS — Z951 Presence of aortocoronary bypass graft: Secondary | ICD-10-CM

## 2022-08-12 LAB — CYTOLOGY - NON PAP

## 2022-08-12 NOTE — Progress Notes (Signed)
Daily Session Note  Patient Details  Name: Ryan Herrera MRN: [id number] Date of Birth: 1946/08/14 Referring Provider:   Flowsheet Row CARDIAC REHAB PHASE II ORIENTATION from 05/29/2022 in [facility]  Referring Provider Dr. Lucien Mons       Encounter Date: 08/12/2022  Check In:  Session Check In - 08/12/22 1448       Check-In   Supervising physician immediately available to respond to emergencies [facility] MD immediately available    Physician(s) Dr. Dellia Cloud    Location [facility]    Staff Present Leana Roe, BS, Exercise Physiologist;Daphyne Hassell Done, RN, BSN;Cruz Bong, RN;Dalton Sherrie George, MS, ACSM-CEP;Debra Wynetta Emery, RN, BSN    Virtual Visit No    Medication changes reported     No    Fall or balance concerns reported    Yes    Comments He has fallen once in the past year.    Tobacco Cessation No Change    Warm-up and Cool-down Performed as group-led instruction    Resistance Training Performed Yes    VAD Patient? No    PAD/SET Patient? No      Pain Assessment   Currently in Pain? No/denies    Multiple Pain Sites No             Capillary Blood Glucose: No results found for this or any previous visit (from the past 24 hour(s)).    Social History   Tobacco Use  Smoking Status Former   Packs/day: 1.00   Years: 38.00   Total pack years: 38.00   Types: Cigarettes   Quit date: 12/1996   Years since quitting: 25.7  Smokeless Tobacco Never    Goals Met:  Independence with exercise equipment Exercise tolerated well No report of concerns or symptoms today Strength training completed today  Goals Unmet:  Not Applicable  Comments: check out @ 3:45pm   Dr. Carlyle Dolly is Medical Director for [place]

## 2022-08-12 NOTE — Telephone Encounter (Signed)
Patient informed of Dr. Nelly Laurence reply: "recommend he can continue to monitor his blood pressures, at the same time each day. After he takes his medications." Recommended for patient to check his BP 1-2 hours after taking his BP medication at the same time each day. He stated that the rehab staff are concerned when his SBP gets to 140. He denies any chest discomfort or headache during rehab.

## 2022-08-13 ENCOUNTER — Telehealth: Payer: Self-pay | Admitting: Family Medicine

## 2022-08-13 NOTE — Telephone Encounter (Signed)
Called to verify patient's date of back surgery.  The patient did not answer, and a voice message was left.

## 2022-08-13 NOTE — Telephone Encounter (Signed)
Patient called in returning provider call .  Date of back surgery 10/04/05  Patient wants a call back in regard to biopsy results.

## 2022-08-13 NOTE — Telephone Encounter (Signed)
I called and spoke with the patient's wife and informed the wife that the patient's biopsy showed atypical cells of undetermined significance. Inform the patient's wife that the biopsy has been sent for Afirma testing to determine if the thyroid nodules are benign or cancerous.she reports that she would relay the information to her husband.

## 2022-08-14 ENCOUNTER — Encounter (HOSPITAL_COMMUNITY)
Admission: RE | Admit: 2022-08-14 | Discharge: 2022-08-14 | Disposition: A | Discharge status: Home / Self Care | Payer: Medicare HMO | Source: Ambulatory Visit | Attending: Internal Medicine | Admitting: Internal Medicine

## 2022-08-14 DIAGNOSIS — I214 Non-ST elevation (NSTEMI) myocardial infarction: Secondary | ICD-10-CM | POA: Diagnosis not present

## 2022-08-14 DIAGNOSIS — Z951 Presence of aortocoronary bypass graft: Secondary | ICD-10-CM

## 2022-08-14 NOTE — Progress Notes (Signed)
Daily Session Note  Patient Details  Name: Ryan Herrera MRN: [id number] Date of Birth: 27-Jun-1946 Referring Provider:   Flowsheet Row CARDIAC REHAB PHASE II ORIENTATION from 05/29/2022 in [place]  Referring Provider Dr. Lucien Mons       Encounter Date: 08/14/2022  Check In:  Session Check In - 08/14/22 1445       Check-In   Supervising physician immediately available to respond to emergencies [facility] MD immediately available    Physician(s) Dr. Dellia Cloud    Location [facility]    Staff Present Leana Roe, BS, Exercise Physiologist;Dalton Sherrie George, MS, ACSM-CEP    Virtual Visit No    Medication changes reported     No    Fall or balance concerns reported    Yes    Comments He has fallen once in the past year.    Tobacco Cessation No Change    Warm-up and Cool-down Performed as group-led instruction    Resistance Training Performed Yes    VAD Patient? No    PAD/SET Patient? No      Pain Assessment   Currently in Pain? No/denies    Multiple Pain Sites No             Capillary Blood Glucose: No results found for this or any previous visit (from the past 24 hour(s)).    Social History   Tobacco Use  Smoking Status Former   Packs/day: 1.00   Years: 38.00   Total pack years: 38.00   Types: Cigarettes   Quit date: 12/1996   Years since quitting: 25.7  Smokeless Tobacco Never    Goals Met:  Independence with exercise equipment Exercise tolerated well No report of concerns or symptoms today Strength training completed today  Goals Unmet:  Not Applicable  Comments: check out 1545   Dr. Carlyle Dolly is Medical Director for [place]
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
## 2022-08-16 ENCOUNTER — Encounter (HOSPITAL_COMMUNITY)
Admission: RE | Admit: 2022-08-16 | Discharge: 2022-08-16 | Disposition: A | Discharge status: Home / Self Care | Payer: Medicare HMO | Source: Ambulatory Visit | Attending: Internal Medicine | Admitting: Internal Medicine

## 2022-08-16 DIAGNOSIS — I214 Non-ST elevation (NSTEMI) myocardial infarction: Secondary | ICD-10-CM | POA: Diagnosis not present

## 2022-08-16 DIAGNOSIS — Z951 Presence of aortocoronary bypass graft: Secondary | ICD-10-CM

## 2022-08-16 NOTE — Progress Notes (Signed)
Daily Session Note  Patient Details  Name: DEFORREST BOGLE MRN: [id number] Date of Birth: [birth year] Referring Provider:   Flowsheet Row CARDIAC REHAB PHASE II ORIENTATION from 05/29/2022 in [place]  Referring Provider Dr. Lucien Mons       Encounter Date: 08/16/2022  Check In:  Session Check In - 08/16/22 1441       Check-In   Supervising physician immediately available to respond to emergencies [facility] MD immediately available    Physician(s) Dr. Dellia Cloud    Location [facility]    Staff Present Aundra Dubin, RN, BSN;Saahas Hidrogo, RN;Dalton Sherrie George, MS, ACSM-CEP;Madelyn Flavors, RN, BSN    Virtual Visit No    Fall or balance concerns reported    Yes    Comments He has fallen once in the past year.    Tobacco Cessation No Change    Warm-up and Cool-down Performed as group-led instruction    Resistance Training Performed Yes    VAD Patient? No    PAD/SET Patient? No      Pain Assessment   Currently in Pain? No/denies    Multiple Pain Sites No             Capillary Blood Glucose: No results found for this or any previous visit (from the past 24 hour(s)).    Social History   Tobacco Use  Smoking Status Former   Packs/day: 1.00   Years: 38.00   Total pack years: 38.00   Types: Cigarettes   Quit date: 12/1996   Years since quitting: 25.7  Smokeless Tobacco Never    Goals Met:  Independence with exercise equipment Exercise tolerated well No report of concerns or symptoms today Strength training completed today  Goals Unmet:  Not Applicable  Comments: check out @ 3:45pm   Dr. Carlyle Dolly is Medical Director for [facility]

## 2022-08-19 ENCOUNTER — Encounter (HOSPITAL_COMMUNITY)
Admission: RE | Admit: 2022-08-19 | Discharge: 2022-08-19 | Disposition: A | Discharge status: Home / Self Care | Payer: Medicare HMO | Source: Ambulatory Visit | Attending: Internal Medicine | Admitting: Internal Medicine

## 2022-08-19 DIAGNOSIS — I214 Non-ST elevation (NSTEMI) myocardial infarction: Secondary | ICD-10-CM | POA: Diagnosis not present

## 2022-08-19 DIAGNOSIS — Z951 Presence of aortocoronary bypass graft: Secondary | ICD-10-CM

## 2022-08-19 NOTE — Progress Notes (Signed)
Daily Session Note  Patient Details  Name: Ryan Herrera MRN: [id number] Date of Birth: 07-24-1946 Referring Provider:   Flowsheet Row CARDIAC REHAB PHASE II ORIENTATION from 05/29/2022 in [place]  Referring Provider Dr. Lucien Mons       Encounter Date: 08/19/2022  Check In:  Session Check In - 08/19/22 1444       Check-In   Supervising physician immediately available to respond to emergencies [facility] MD immediately available    Physician(s) Dr. Harl Bowie    Location [facility]    Staff Present Leana Roe, BS, Exercise Physiologist;Haddy Mullinax, RN;Dalton Sherrie George, MS, ACSM-CEP;Debra Wynetta Emery, RN, Joanette Gula, RN, BSN    Virtual Visit No    Medication changes reported     No    Fall or balance concerns reported    Yes    Comments He has fallen once in the past year.    Tobacco Cessation No Change    Warm-up and Cool-down Performed as group-led instruction    Resistance Training Performed Yes    VAD Patient? No    PAD/SET Patient? No      Pain Assessment   Currently in Pain? No/denies    Multiple Pain Sites No             Capillary Blood Glucose: No results found for this or any previous visit (from the past 24 hour(s)).    Social History   Tobacco Use  Smoking Status Former   Packs/day: 1.00   Years: 38.00   Total pack years: 38.00   Types: Cigarettes   Quit date: 12/1996   Years since quitting: 25.7  Smokeless Tobacco Never    Goals Met:  Independence with exercise equipment Exercise tolerated well No report of concerns or symptoms today Strength training completed today  Goals Unmet:  Not Applicable  Comments: check out @ 3:45pm   Dr. Carlyle Dolly is Medical Director for [facility]

## 2022-08-20 ENCOUNTER — Telehealth: Payer: Self-pay | Admitting: "Endocrinology

## 2022-08-20 NOTE — Telephone Encounter (Signed)
New message    The patient called left a voicemail C/o low blood sugars ,spoke with the wife does not know the date of the blood sugar reading.

## 2022-08-21 ENCOUNTER — Telehealth: Payer: Self-pay | Admitting: Family Medicine

## 2022-08-21 ENCOUNTER — Encounter (HOSPITAL_COMMUNITY)
Admission: RE | Admit: 2022-08-21 | Discharge: 2022-08-21 | Disposition: A | Discharge status: Home / Self Care | Payer: Medicare HMO | Source: Ambulatory Visit | Attending: Internal Medicine | Admitting: Internal Medicine

## 2022-08-21 DIAGNOSIS — Z951 Presence of aortocoronary bypass graft: Secondary | ICD-10-CM

## 2022-08-21 DIAGNOSIS — I214 Non-ST elevation (NSTEMI) myocardial infarction: Secondary | ICD-10-CM

## 2022-08-21 NOTE — Telephone Encounter (Signed)
Pt left a VM stating he has low blood sugars in the 50's when he wakes up. Please Advise

## 2022-08-21 NOTE — Progress Notes (Signed)
Daily Session Note  Patient Details  Name: Ryan Herrera MRN: [id number] Date of Birth: 1946/01/09 Referring Provider:   Flowsheet Row CARDIAC REHAB PHASE II ORIENTATION from 05/29/2022 in [place]  Referring Provider Dr. Lucien Mons       Encounter Date: 08/21/2022  Check In:  Session Check In - 08/21/22 1445       Check-In   Supervising physician immediately available to respond to emergencies [facility] MD immediately available    Physician(s) Dr. Harl Bowie    Location [facility]    Staff Present Leana Roe, BS, Exercise Physiologist;Dalton Sherrie George, MS, ACSM-CEP;Madelyn Flavors, RN, BSN    Virtual Visit No    Medication changes reported     No    Fall or balance concerns reported    Yes    Comments He has fallen once in the past year.    Tobacco Cessation No Change    Warm-up and Cool-down Performed as group-led instruction    Resistance Training Performed Yes    VAD Patient? No    PAD/SET Patient? No      Pain Assessment   Currently in Pain? No/denies    Multiple Pain Sites No             Capillary Blood Glucose: No results found for this or any previous visit (from the past 24 hour(s)).    Social History   Tobacco Use  Smoking Status Former   Packs/day: 1.00   Years: 38.00   Total pack years: 38.00   Types: Cigarettes   Quit date: 12/1996   Years since quitting: 25.7  Smokeless Tobacco Never    Goals Met:  Independence with exercise equipment Exercise tolerated well No report of concerns or symptoms today Strength training completed today  Goals Unmet:  Not Applicable  Comments: Checkout at 1545.   Dr. Carlyle Dolly is Medical Director for [facility]

## 2022-08-21 NOTE — Telephone Encounter (Signed)
Pt would like results for ultrasound?

## 2022-08-21 NOTE — Telephone Encounter (Signed)
Patient left voicemail returning call on test results.

## 2022-08-21 NOTE — Telephone Encounter (Signed)
Discussed with pt, he stated he is taking [place] 30 units. Discussed with Dr.Nida. Advised pt to stop glipizide and reduce Tresiba to 20 units qhs and to check BG twice daily per Dr.Nida's orders.

## 2022-08-22 ENCOUNTER — Telehealth: Payer: Self-pay | Admitting: Family Medicine

## 2022-08-22 NOTE — Telephone Encounter (Signed)
I called and spoke with the patient, informing him that his biopsy showed atypical cells of undetermined significance. Inform the patient that the biopsy has been sent for Afirma testing to determine if the thyroid nodules are benign or cancerous, and we are awaiting the results.

## 2022-08-23 ENCOUNTER — Encounter (HOSPITAL_COMMUNITY)
Admission: RE | Admit: 2022-08-23 | Discharge: 2022-08-23 | Disposition: A | Discharge status: Home / Self Care | Payer: Medicare HMO | Source: Ambulatory Visit | Attending: Internal Medicine | Admitting: Internal Medicine

## 2022-08-23 VITALS — Ht 66.0 in | Wt 192.7 lb

## 2022-08-23 DIAGNOSIS — I214 Non-ST elevation (NSTEMI) myocardial infarction: Secondary | ICD-10-CM | POA: Diagnosis not present

## 2022-08-23 DIAGNOSIS — Z951 Presence of aortocoronary bypass graft: Secondary | ICD-10-CM

## 2022-08-23 NOTE — Progress Notes (Signed)
Daily Session Note  Patient Details  Name: Ryan Herrera MRN: [id number] Date of Birth: 1946/07/18 Referring Provider:   Flowsheet Row CARDIAC REHAB PHASE II ORIENTATION from 05/29/2022 in [place]  Referring Provider Dr. Lucien Mons       Encounter Date: 08/23/2022  Check In:  Session Check In - 08/23/22 1440       Check-In   Supervising physician immediately available to respond to emergencies [facility] MD immediately available    Physician(s) Dr. Harl Bowie    Location [facility]    Staff Present Leana Roe, BS, Exercise Physiologist;Daphyne Hassell Done, RN, Jennye Moccasin, RN, BSN;Jannah Guardiola, RN    Virtual Visit No    Medication changes reported     No    Fall or balance concerns reported    Yes    Comments He has fallen once in the past year.    Tobacco Cessation No Change    Warm-up and Cool-down Performed as group-led instruction    Resistance Training Performed Yes    VAD Patient? No    PAD/SET Patient? No      Pain Assessment   Currently in Pain? No/denies    Multiple Pain Sites No             Capillary Blood Glucose: No results found for this or any previous visit (from the past 24 hour(s)).    Social History   Tobacco Use  Smoking Status Former   Packs/day: 1.00   Years: 38.00   Total pack years: 38.00   Types: Cigarettes   Quit date: 12/1996   Years since quitting: 25.7  Smokeless Tobacco Never    Goals Met:  Independence with exercise equipment Exercise tolerated well No report of concerns or symptoms today Strength training completed today  Goals Unmet:  Not Applicable  Comments: check out @ 3:45pm   Dr. Rodman Pickle is Medical Director for Pulmonary Rehab at [facility]

## 2022-08-26 ENCOUNTER — Encounter (HOSPITAL_COMMUNITY): Payer: Self-pay

## 2022-08-26 ENCOUNTER — Telehealth: Payer: Self-pay | Admitting: Family Medicine
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
## 2022-08-26 NOTE — Telephone Encounter (Signed)
Left a voice message informing patient to contact me to review the results of his thyroid biopsy.

## 2022-09-06 ENCOUNTER — Encounter: Payer: Medicare HMO | Admitting: Nurse Practitioner

## 2022-09-06 ENCOUNTER — Ambulatory Visit
Admission: EM | Admit: 2022-09-06 | Discharge: 2022-09-06 | Disposition: A | Discharge status: Home / Self Care | Payer: Medicare HMO | Attending: Family Medicine | Admitting: Family Medicine

## 2022-09-06 ENCOUNTER — Encounter: Payer: Self-pay | Admitting: Emergency Medicine

## 2022-09-06 DIAGNOSIS — U071 COVID-19: Secondary | ICD-10-CM | POA: Diagnosis not present

## 2022-09-06 DIAGNOSIS — J069 Acute upper respiratory infection, unspecified: Secondary | ICD-10-CM | POA: Diagnosis present

## 2022-09-06 HISTORY — DX: Acute myocardial infarction, unspecified: I21.9

## 2022-09-06 LAB — RESP PANEL BY RT-PCR (FLU A&B, COVID) ARPGX2
Influenza A by PCR: NEGATIVE
Influenza B by PCR: NEGATIVE
SARS Coronavirus 2 by RT PCR: POSITIVE — AB

## 2022-09-06 MED ORDER — MOLNUPIRAVIR EUA 200MG CAPSULE: 4.0000 | ORAL_CAPSULE | Freq: Two times a day (BID) | ORAL | 0 refills | Status: AC

## 2022-09-06 MED ORDER — FLUTICASONE PROPIONATE 50 MCG/ACT NA SUSP: 1.0000 | Freq: Two times a day (BID) | NASAL | 2 refills | Status: DC

## 2022-09-06 MED ORDER — GUAIFENESIN ER 600 MG PO TB12: 600.0000 mg | ORAL_TABLET | Freq: Two times a day (BID) | ORAL | 0 refills | Status: DC

## 2022-09-06 MED ORDER — PROMETHAZINE-DM 6.25-15 MG/5ML PO SYRP: 5.0000 mL | ORAL_SOLUTION | Freq: Four times a day (QID) | ORAL | 0 refills | Status: DC | PRN

## 2022-09-06 NOTE — ED Triage Notes (Signed)
Cough, chest and nasal congestion, sore throat, fever, body aches, diarrhea since Monday.  States he has been urinating frequently.  States he has been drinking a lot of water and drink a glass every time he goes to the bathroom.  Home covid test tonight was positive.
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
## 2022-09-13 ENCOUNTER — Encounter: Payer: Self-pay | Admitting: Family Medicine

## 2022-09-13 ENCOUNTER — Ambulatory Visit (INDEPENDENT_AMBULATORY_CARE_PROVIDER_SITE_OTHER): Payer: Medicare HMO | Admitting: Family Medicine

## 2022-09-13 VITALS — BP 122/68 | HR 68 | Ht 66.5 in | Wt 185.1 lb

## 2022-09-13 DIAGNOSIS — R058 Other specified cough: Secondary | ICD-10-CM | POA: Diagnosis not present

## 2022-09-13 DIAGNOSIS — E119 Type 2 diabetes mellitus without complications: Secondary | ICD-10-CM | POA: Diagnosis not present

## 2022-09-13 DIAGNOSIS — E785 Hyperlipidemia, unspecified: Secondary | ICD-10-CM

## 2022-09-13 DIAGNOSIS — I10 Essential (primary) hypertension: Secondary | ICD-10-CM

## 2022-09-13 DIAGNOSIS — Z1159 Encounter for screening for other viral diseases: Secondary | ICD-10-CM

## 2022-09-13 DIAGNOSIS — B356 Tinea cruris: Secondary | ICD-10-CM | POA: Diagnosis not present

## 2022-09-13 DIAGNOSIS — E559 Vitamin D deficiency, unspecified: Secondary | ICD-10-CM

## 2022-09-13 DIAGNOSIS — E038 Other specified hypothyroidism: Secondary | ICD-10-CM

## 2022-09-13 DIAGNOSIS — I2510 Atherosclerotic heart disease of native coronary artery without angina pectoris: Secondary | ICD-10-CM

## 2022-09-13 MED ORDER — KETOCONAZOLE 2 % EX CREA: 1.0000 | TOPICAL_CREAM | Freq: Every day | CUTANEOUS | 3 refills | Status: DC

## 2022-09-13 NOTE — Progress Notes (Unsigned)
Established Patient Office Visit  Subjective:  Patient ID: Ryan Herrera, male    DOB: October 26, 1945  Age: 76 y.o. MRN: [id number]  CC:  Chief Complaint  Patient presents with   Follow-up    Pt reports having high bp readings has called cardiology to try to get in sooner.    Rash    Pt reports skin problem has used ketoconazole cream 2% and this is the only thing that helps.   Cough    Pt reports having covid on 12/01, has lingering cough and chest congestion from this.     HPI Ryan Herrera is a 76 y.o. male with past medical history of tinea of groin, cough, hypertension, and dyslipidemia presents for f/u of  chronic medical conditions.  Tinea of Groin: He complains of an erythematous pruritic rash in the creases of the groin.  He reports the onset of his rash after his prostate surgery in [place] [facility].  He uses ketoconazole 2% shampoo and would like to refill  his ketoconazole 2% cream to apply to the affected site.  Cough: He complains of a lingering cough since his diagnosis with COVID on 09/06/2022.  He reports feeling well overall.  He reports not taking Promethazine DM for cough once his  COVID symptoms abated.  Hypertension: He takes hydrochlorothiazide 25 mg daily and metoprolol 25 mg daily.  He reports compliance with treatment regimen  Hyperlipidemia: He takes ezetimibe 10 mg daily and Repatha 140 mg subcu injection every 14 days.  He reports compliance with treatment regimen.   Past Medical History:  Diagnosis Date   Arthritis    hands   Coronary artery disease    Diabetes mellitus without complication ([name])    GERD (gastroesophageal reflux disease)    H/O poliomyelitis    age 67.  now has "weak stomach muscles"   Hypertension    IBS (irritable bowel syndrome)    Iron deficiency anemia 09/22/2018   Myocardial infarct [facility])    Renal insufficiency    Thyroid disease     Past Surgical History:  Procedure Laterality Date   [place] SURGERY  10/04/2005   benign tumor removal  2004   CORONARY ARTERY BYPASS GRAFT  2008   [facility] - 3 vessel   CORONARY ARTERY BYPASS GRAFT N/A 03/28/2022   Procedure: REDO CORONARY ARTERY BYPASS GRAFTING (CABG) X2 BYPASSES USING OPEN LEFT RADIAL ARTERY AND ENDOSCOPIC RIGHT GREATER SAPHENOUS VEIN HARVEST;  Surgeon: Melrose Nakayama, MD;  Location: [place];  Service: Open Heart Surgery;  Laterality: N/A;   CYSTOSCOPY WITH INSERTION OF UROLIFT     HERNIA REPAIR Bilateral    inguinal   HYDROCELE EXCISION / REPAIR     JOINT REPLACEMENT     left knee x2, rright x1   LEFT HEART CATH AND CORS/GRAFTS ANGIOGRAPHY N/A 03/25/2022   Procedure: LEFT HEART CATH AND CORS/GRAFTS ANGIOGRAPHY;  Surgeon: Jettie Booze, MD;  Location: [place] [facility];  Service: Cardiovascular;  Laterality: N/A;   PROSTATE SURGERY N/A 01/2021   TEE WITHOUT CARDIOVERSION N/A 03/28/2022   Procedure: TRANSESOPHAGEAL ECHOCARDIOGRAM (TEE);  Surgeon: Melrose Nakayama, MD;  Location: [place];  Service: Open Heart Surgery;  Laterality: N/A;   TONSILLECTOMY      Family History  Problem Relation Age of Onset   Hypertension Mother    Heart attack Mother    Osteoarthritis Mother    Stroke Father    Hypertension Father  Stroke Sister    Stroke Sister    Colon cancer Neg Hx    Prostate cancer Neg Hx    Cancer - Lung Neg Hx     Social History   Socioeconomic History   Marital status: Divorced    Spouse name: Not on file   Number of children: 1   Years of education: 12   Highest education level: Master's degree (e.g., MA, MS, MEng, MEd, MSW, MBA)  Occupational History   Occupation: retired  Tobacco Use   Smoking status: Former    Packs/day: 1.00    Years: 38.00    Total pack years: 38.00    Types: Cigarettes    Quit date: 12/1996    Years since quitting: 25.7   Smokeless tobacco: Never  Vaping Use   Vaping Use: Never used  Substance and Sexual Activity   Alcohol use: Yes    Comment:  occassionally   Drug use: No   Sexual activity: Not Currently  Other Topics Concern   Not on file  Social History Narrative   Lives with his significant other, pt is retired    Scientist, 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[place]  (11/08/2021)   Overall Financial Resource Strain (CARDIA)    Difficulty of Paying Living Expenses: Not hard at all  Food Insecurity: No Food Insecurity (11/08/2021)   Hunger Vital Sign    Worried About Running Out of Food in the Last Year: Never true    [place] in the Last Year: Never true  Transportation Needs: No Transportation Needs (11/08/2021)   PRAPARE - Hydrologist (Medical): No    Lack of Transportation (Non-Medical): No  Physical Activity: Inactive (11/08/2021)   Exercise Vital Sign    Days of Exercise per Week: 0 days    Minutes of Exercise per Session: 0 min  Stress: No Stress Concern Present (11/08/2021)   [place]    Feeling of Stress : Not at all  Social Connections: Socially Isolated (11/08/2021)   Social Connection and Isolation Panel [NHANES]    Frequency of Communication with Friends and Family: More than three times a week    Frequency of Social Gatherings with Friends and Family: Once a week    Attends Religious Services: Never    Marine scientist or Organizations: No    Attends Archivist Meetings: Never    Marital Status: Divorced  Human resources officer Violence: Not At Risk (11/08/2021)   Humiliation, Afraid, Rape, and Kick questionnaire    Fear of Current or Ex-Partner: No    Emotionally Abused: No    Physically Abused: No    Sexually Abused: No    Outpatient Medications Prior to Visit  Medication Sig Dispense Refill   acetaminophen (TYLENOL) 650 MG CR tablet Take 1,300 mg by mouth every 8 (eight) hours as needed for pain.     allopurinol (ZYLOPRIM) 100 MG tablet Take 1 tablet (100 mg total) by mouth  daily. 30 tablet 3   aspirin EC 81 MG tablet Take 81 mg by mouth daily.     BD PEN NEEDLE NANO U/F 32G X 4 MM MISC      Betamethasone Dipropionate Aug (DIPROLENE EX) Apply 1 Application topically daily as needed (rash).     Carboxymethylcellulose Sodium (THERATEARS PF OP) Place 1 drop into both eyes daily as needed (dry eyes).     Cholecalciferol (VITAMIN D)  50 MCG (2000 UT) tablet Take 2,000 Units by mouth daily.     clotrimazole-betamethasone (LOTRISONE) cream Apply 1 Application topically 2 (two) times daily as needed (rash).     cyanocobalamin (VITAMIN B12) 1000 MCG tablet Take 1,000 mcg by mouth daily.     cyclobenzaprine (FLEXERIL) 5 MG tablet Take 1 tablet (5 mg total) by mouth 3 (three) times daily as needed for muscle spasms. 30 tablet 0   dextromethorphan 15 MG/5ML syrup Take 10 mLs (30 mg total) by mouth 4 (four) times daily as needed for cough. 120 mL 0   Evolocumab (REPATHA SURECLICK) 256 MG/ML SOAJ Inject 140 mg into the skin every 14 (fourteen) days. 2 mL 3   ezetimibe (ZETIA) 10 MG tablet Take 1 tablet (10 mg total) by mouth daily. 90 tablet 3   fluticasone (FLONASE) 50 MCG/ACT nasal spray Place 1 spray into both nostrils 2 (two) times daily. 16 g 2   glipiZIDE (GLUCOTROL XL) 5 MG 24 hr tablet TAKE 1 TABLET BY MOUTH EVERY DAY WITH BREAKFAST 90 tablet 0   glucose blood test strip 1 each by Other route as needed. Use to check blood glucose twice daily as instructed 100 each 2   guaiFENesin (MUCINEX) 600 MG 12 hr tablet Take 1 tablet (600 mg total) by mouth 2 (two) times daily. 14 tablet 0   guaiFENesin (MUCINEX) 600 MG 12 hr tablet Take 1 tablet (600 mg total) by mouth 2 (two) times daily. 20 tablet 0   hydrochlorothiazide (HYDRODIURIL) 25 MG tablet Take 1 tablet (25 mg total) by mouth daily. 90 tablet 3   ketoconazole (NIZORAL) 2 % shampoo Apply 1 Application topically See admin instructions. Uses a small amount with every shower     levothyroxine (SYNTHROID) 100 MCG tablet TAKE 1  TABLET BY MOUTH EVERY DAY BEFORE BREAKFAST 90 tablet 0   Lidocaine HCl (LIDOCAINE PLUS EX) Apply 1 Application topically daily as needed (itching/pain).     metFORMIN (GLUCOPHAGE) 1000 MG tablet Take 1,000 mg by mouth 2 (two) times daily with a meal.     metoprolol succinate (TOPROL-XL) 25 MG 24 hr tablet Take 1 tablet (25 mg total) by mouth daily. 90 tablet 3   omeprazole (PRILOSEC) 20 MG capsule TAKE 1 CAPSULE BY MOUTH EVERY DAY 90 capsule 1   pantoprazole (PROTONIX) 40 MG tablet Take 1 tablet (40 mg total) by mouth daily. 90 tablet 3   promethazine-dextromethorphan (PROMETHAZINE-DM) 6.25-15 MG/5ML syrup Take 5 mLs by mouth 4 (four) times daily as needed. 100 mL 0   ranolazine (RANEXA) 500 MG 12 hr tablet TAKE 1 TABLET BY MOUTH TWICE A DAY 60 tablet 3   tadalafil (CIALIS) 5 MG tablet Take 5 mg by mouth daily.     TRESIBA FLEXTOUCH 100 UNIT/ML FlexTouch Pen INJECT 20 UNITS INTO THE SKIN AT BEDTIME. 15 mL 1   triamcinolone cream (KENALOG) 0.5 % Apply 1 Application topically 2 (two) times daily as needed (tick bites).     UNABLE TO FIND Lift chair DX: M47.812 1 each 0   UNABLE TO FIND Blood pressure cuff DX: I10 1 each 0   valACYclovir (VALTREX) 500 MG tablet TAKE 1 TABLET BY MOUTH TWICE A DAY 180 tablet 1   No facility-administered medications prior to visit.    Allergies  Allergen Reactions   Myrbetriq [Mirabegron] Nausea And Vomiting   Jardiance [Empagliflozin]    Niacin And Related Itching   Nsaids     Kidney damage    Other Diarrhea and  Nausea And Vomiting    PT does not want OPIOIDS   GLP-1 drugs - Nausea, vomiting, diarrhea, belching   Statins Itching    ROS Review of Systems  Constitutional:  Negative for chills, fatigue and fever.  Eyes:  Negative for visual disturbance.  Respiratory:  Negative for chest tightness and shortness of breath.   Cardiovascular:  Negative for chest pain and palpitations.  Neurological:  Negative for dizziness and headaches.      Objective:     Physical Exam HENT:     Head: Normocephalic.     Right Ear: External ear normal.     Left Ear: External ear normal.  Cardiovascular:     Rate and Rhythm: Regular rhythm. Bradycardia present.     Heart sounds: No murmur heard. Pulmonary:     Effort: No respiratory distress.     Breath sounds: Normal breath sounds.  Neurological:     Mental Status: He is alert.     BP 122/68 (BP Location: Left Arm)   Pulse 68   Ht 5' 6.5" (1.689 m)   Wt 185 lb 1.3 oz (84 kg)   SpO2 96%   BMI 29.43 kg/m  Wt Readings from Last 3 Encounters:  09/13/22 185 lb 1.3 oz (84 kg)  08/23/22 192 lb 10.9 oz (87.4 kg)  08/12/22 193 lb 2 oz (87.6 kg)    Lab Results  Component Value Date   TSH 2.349 03/24/2022   Lab Results  Component Value Date   WBC 5.2 06/11/2022   HGB 12.4 (L) 06/11/2022   HCT 39.0 06/11/2022   MCV 89.7 06/11/2022   PLT 247 06/11/2022   Lab Results  Component Value Date   NA 136 07/17/2022   K 4.0 07/17/2022   CO2 22 07/17/2022   GLUCOSE 268 (H) 07/17/2022   BUN 12 07/17/2022   CREATININE 0.95 07/17/2022   BILITOT 0.7 06/11/2022   ALKPHOS 67 06/11/2022   AST 21 06/11/2022   ALT 19 06/11/2022   PROT 6.9 06/11/2022   ALBUMIN 3.5 06/11/2022   CALCIUM 9.5 07/17/2022   ANIONGAP 8 06/11/2022   EGFR 82 07/17/2022   Lab Results  Component Value Date   CHOL 120 03/12/2022   Lab Results  Component Value Date   HDL 39 (L) 03/12/2022   Lab Results  Component Value Date   LDLCALC 63 03/12/2022   Lab Results  Component Value Date   TRIG 94 03/12/2022   Lab Results  Component Value Date   CHOLHDL 3.1 03/12/2022   Lab Results  Component Value Date   HGBA1C 7.3 (A) 06/20/2022      Assessment & Plan:  Essential hypertension, benign Assessment & Plan: Controlled in the clinic today Encouraged to continue taking hydrochlorothiazide 25 mg daily and metoprolol 25 mg daily BP Readings from Last 3 Encounters:  09/13/22 122/68  09/06/22 (!) 163/84  08/08/22  (!) 162/80     Orders: -     CMP14+EGFR -     CBC with Differential/Platelet  Diabetes mellitus type 2 in nonobese ([name]) -     HM Diabetes Foot Exam -     Microalbumin / creatinine urine ratio -     Hemoglobin A1c  Tinea of groin Assessment & Plan: Refilled ketoconazole 2% shampoo and would like to refill  his ketoconazole 2% cream   Orders: -     Ketoconazole; Apply 1 Application topically daily.  Dispense: 15 g; Refill: 3  Other cough Assessment & Plan: The patient complains  of a lingering cough since being diagnosed with COVID on 09/06/2022 Inform the patient that coughing can linger for a few weeks after COVID symptoms subsides Patient reports feeling well and he will manage symptoms symptomatically   Dyslipidemia Assessment & Plan: Encouraged to continue taking ezetimibe 10 mg daily and Repatha 140 mg subcu injection every 14 days Will assess lipid panel today Lab Results  Component Value Date   CHOL 120 03/12/2022   HDL 39 (L) 03/12/2022   LDLCALC 63 03/12/2022   TRIG 94 03/12/2022   CHOLHDL 3.1 03/12/2022     Orders: -     Lipid panel  Coronary artery disease involving native coronary artery of native heart without angina pectoris  Vitamin D deficiency -     VITAMIN D 25 Hydroxy (Vit-D Deficiency, Fractures)  Need for hepatitis C screening test -     Hepatitis C antibody  Other specified hypothyroidism -     TSH + free T4    Follow-up: Return in about 3 months (around 12/13/2022).   Alvira Monday, FNP

## 2022-09-13 NOTE — Patient Instructions (Signed)
I appreciate the opportunity to provide care to you today!    Follow up:  3 months  Labs: please stop by the lab today to get your blood drawn (CBC, CMP, TSH, Lipid profile, HgA1c, Vit D)  Screening:  Hep C  Please pick up your medications at the pharmacy   Please continue to a heart-healthy diet and increase your physical activities. Try to exercise for 73mns at least three times a week.      It was a pleasure to see you and I look forward to continuing to work together on your health and well-being. Please do not hesitate to call the office if you need care or have questions about your care.   Have a wonderful day and week. With Gratitude, GAlvira MondayMSN, FNP-BC

## 2022-09-14 DIAGNOSIS — B356 Tinea cruris: Secondary | ICD-10-CM | POA: Insufficient documentation

## 2022-09-14 NOTE — Assessment & Plan Note (Signed)
Controlled in the clinic today Encouraged to continue taking hydrochlorothiazide 25 mg daily and metoprolol 25 mg daily BP Readings from Last 3 Encounters:  09/13/22 122/68  09/06/22 (!) 163/84  08/08/22 (!) 162/80

## 2022-09-14 NOTE — Assessment & Plan Note (Signed)
Refilled ketoconazole 2% shampoo and would like to refill  his ketoconazole 2% cream

## 2022-09-14 NOTE — Assessment & Plan Note (Signed)
Encouraged to continue taking ezetimibe 10 mg daily and Repatha 140 mg subcu injection every 14 days Will assess lipid panel today Lab Results  Component Value Date   CHOL 120 03/12/2022   HDL 39 (L) 03/12/2022   LDLCALC 63 03/12/2022   TRIG 94 03/12/2022   CHOLHDL 3.1 03/12/2022

## 2022-09-14 NOTE — Assessment & Plan Note (Signed)
The patient complains of a lingering cough since being diagnosed with COVID on 09/06/2022 Inform the patient that coughing can linger for a few weeks after COVID symptoms subsides Patient reports feeling well and he will manage symptoms symptomatically

## 2022-09-17 ENCOUNTER — Other Ambulatory Visit: Payer: Self-pay | Admitting: Internal Medicine

## 2022-10-03 ENCOUNTER — Other Ambulatory Visit: Payer: Self-pay | Admitting: Family Medicine

## 2022-10-03 DIAGNOSIS — E119 Type 2 diabetes mellitus without complications: Secondary | ICD-10-CM

## 2022-10-03 LAB — CMP14+EGFR
ALT: 23 IU/L (ref 0–44)
AST: 24 IU/L (ref 0–40)
Albumin/Globulin Ratio: 1.3 (ref 1.2–2.2)
Albumin: 3.9 g/dL (ref 3.8–4.8)
Alkaline Phosphatase: 93 IU/L (ref 44–121)
BUN/Creatinine Ratio: 16 (ref 10–24)
BUN: 16 mg/dL (ref 8–27)
Bilirubin Total: 0.3 mg/dL (ref 0.0–1.2)
CO2: 21 mmol/L (ref 20–29)
Calcium: 9.6 mg/dL (ref 8.6–10.2)
Chloride: 98 mmol/L (ref 96–106)
Creatinine, Ser: 0.98 mg/dL (ref 0.76–1.27)
Globulin, Total: 2.9 g/dL (ref 1.5–4.5)
Glucose: 155 mg/dL — ABNORMAL HIGH (ref 70–99)
Potassium: 4.1 mmol/L (ref 3.5–5.2)
Sodium: 133 mmol/L — ABNORMAL LOW (ref 134–144)
Total Protein: 6.8 g/dL (ref 6.0–8.5)
eGFR: 79 mL/min/{1.73_m2} (ref >59)

## 2022-10-03 LAB — LIPID PANEL
Chol/HDL Ratio: 3.3 ratio (ref 0.0–5.0)
Cholesterol, Total: 140 mg/dL (ref 100–199)
HDL: 43 mg/dL (ref >39)
LDL Chol Calc (NIH): 84 mg/dL (ref 0–99)
Triglycerides: 66 mg/dL (ref 0–149)
VLDL Cholesterol Cal: 13 mg/dL (ref 5–40)

## 2022-10-03 LAB — TSH+FREE T4
Free T4: 1.44 ng/dL (ref 0.82–1.77)
TSH: 2.99 u[IU]/mL (ref 0.450–4.500)

## 2022-10-03 LAB — CBC WITH DIFFERENTIAL/PLATELET
Basophils Absolute: 0 10*3/uL (ref 0.0–0.2)
Basos: 1 %
EOS (ABSOLUTE): 0.2 10*3/uL (ref 0.0–0.4)
Eos: 5 %
Hematocrit: 35.6 % — ABNORMAL LOW (ref 37.5–51.0)
Hemoglobin: 11.6 g/dL — ABNORMAL LOW (ref 13.0–17.7)
Immature Grans (Abs): 0 10*3/uL (ref 0.0–0.1)
Immature Granulocytes: 0 %
Lymphocytes Absolute: 1.5 10*3/uL (ref 0.7–3.1)
Lymphs: 33 %
MCH: 28.6 pg (ref 26.6–33.0)
MCHC: 32.6 g/dL (ref 31.5–35.7)
MCV: 88 fL (ref 79–97)
Monocytes Absolute: 0.4 10*3/uL (ref 0.1–0.9)
Monocytes: 9 %
Neutrophils Absolute: 2.4 10*3/uL (ref 1.4–7.0)
Neutrophils: 52 %
Platelets: 179 10*3/uL (ref 150–450)
RBC: 4.05 x10E6/uL — ABNORMAL LOW (ref 4.14–5.80)
RDW: 15.1 % (ref 11.6–15.4)
WBC: 4.5 10*3/uL (ref 3.4–10.8)

## 2022-10-03 LAB — HEMOGLOBIN A1C
Est. average glucose Bld gHb Est-mCnc: 194 mg/dL
Hgb A1c MFr Bld: 8.4 % — ABNORMAL HIGH (ref 4.8–5.6)

## 2022-10-03 LAB — HEPATITIS C ANTIBODY: Hep C Virus Ab: NONREACTIVE

## 2022-10-03 LAB — VITAMIN D 25 HYDROXY (VIT D DEFICIENCY, FRACTURES): Vit D, 25-Hydroxy: 50.1 ng/mL (ref 30.0–100.0)

## 2022-10-03 MED ORDER — GLIPIZIDE 5 MG PO TABS: 5.0000 mg | ORAL_TABLET | Freq: Two times a day (BID) | ORAL | 3 refills | Status: DC

## 2022-10-03 NOTE — Progress Notes (Signed)
Please inform the patient that his hemoglobin A1c has increased from 7.6  six months ago to 8.4. Please encourage the patient to continue taking metformin at 1000 mg twice daily. I have sent a prescription for glipizide 5 mg to take twice daily. I also recommend decreasing his intake of high-sugar foods with increased physical activity. His hemoglobin hematocrit is slightly low; I recommend increasing his intake of iron-rich foods. His kidneys, liver, thyroid, and cholesterol levels are stable.

## 2022-10-04 LAB — MICROALBUMIN / CREATININE URINE RATIO
Creatinine, Urine: 57.4 mg/dL
Microalb/Creat Ratio: 104 mg/g creat — ABNORMAL HIGH (ref 0–29)
Microalbumin, Urine: 59.6 ug/mL

## 2022-10-07 NOTE — Progress Notes (Signed)
   Acute Office Visit  Subjective:     Patient ID: Ryan Herrera, male    DOB: Apr 30, 1946, 77 y.o.   MRN: [id number]  No chief complaint on file.   Neck Pain: Paitent complains of neck pain. Event that precipitate these symptoms: {inciting event:14349}. Onset of symptoms {0-10:33138} {units plural:11} ago, {course:17::"unchanged"} since that time. Current symptoms are {back symptoms:16447}. Patient denies {back symptoms:16447}. Patient has had {prior neck pain:5777::"no prior neck problems"}.  Previous treatments include: {prior neck rx:10608::"none"}   Neck Pain    Patient is in today for ***  Review of Systems  Musculoskeletal:  Positive for neck pain.        Objective:    There were no vitals taken for this visit. {Vitals History (Optional):23777}  Physical Exam  No results found for any visits on 10/08/22.      Assessment & Plan:   Problem List Items Addressed This Visit   None   No orders of the defined types were placed in this encounter.   No follow-ups on file.  Renard Hamper Ria Comment, FNP

## 2022-10-08 ENCOUNTER — Encounter: Payer: Self-pay | Admitting: Family Medicine

## 2022-10-08 ENCOUNTER — Ambulatory Visit (INDEPENDENT_AMBULATORY_CARE_PROVIDER_SITE_OTHER): Payer: Medicare HMO | Admitting: Family Medicine

## 2022-10-08 ENCOUNTER — Ambulatory Visit: Payer: Medicare HMO | Admitting: Family Medicine

## 2022-10-08 VITALS — BP 134/72 | HR 73 | Ht 66.0 in | Wt 192.0 lb

## 2022-10-08 DIAGNOSIS — F419 Anxiety disorder, unspecified: Secondary | ICD-10-CM | POA: Insufficient documentation

## 2022-10-08 DIAGNOSIS — M542 Cervicalgia: Secondary | ICD-10-CM | POA: Insufficient documentation
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
## 2022-10-08 NOTE — Patient Instructions (Addendum)
Please follow up with Orthopedics referral for your neck pain. Please follow up with Behavioral Health to discuss about your anxiety. Please contact your Primary Care Provider if symptoms get worse.

## 2022-10-12 ENCOUNTER — Other Ambulatory Visit: Payer: Self-pay | Admitting: Internal Medicine

## 2022-10-17 ENCOUNTER — Other Ambulatory Visit: Payer: Self-pay

## 2022-10-17 ENCOUNTER — Telehealth: Payer: Self-pay | Admitting: Family Medicine

## 2022-10-17 MED ORDER — METFORMIN HCL 1000 MG PO TABS: 1000.0000 mg | ORAL_TABLET | Freq: Two times a day (BID) | ORAL | 2 refills | Status: DC

## 2022-10-17 NOTE — Telephone Encounter (Signed)
Patient wants refill on  metFORMIN (GLUCOPHAGE) 1000 MG tablet   [facility]  Wants a call when med is sent in

## 2022-10-17 NOTE — Telephone Encounter (Signed)
Rx sent 

## 2022-10-20 ENCOUNTER — Other Ambulatory Visit: Payer: Self-pay | Admitting: "Endocrinology

## 2022-10-21 ENCOUNTER — Encounter: Payer: Self-pay | Admitting: "Endocrinology

## 2022-10-21 ENCOUNTER — Ambulatory Visit (INDEPENDENT_AMBULATORY_CARE_PROVIDER_SITE_OTHER): Payer: Medicare HMO | Admitting: "Endocrinology

## 2022-10-21 VITALS — BP 148/76 | HR 80 | Ht 66.0 in | Wt 195.0 lb

## 2022-10-21 DIAGNOSIS — E782 Mixed hyperlipidemia: Secondary | ICD-10-CM

## 2022-10-21 DIAGNOSIS — I10 Essential (primary) hypertension: Secondary | ICD-10-CM

## 2022-10-21 DIAGNOSIS — E1159 Type 2 diabetes mellitus with other circulatory complications: Secondary | ICD-10-CM | POA: Diagnosis not present

## 2022-10-21 MED ORDER — TRESIBA FLEXTOUCH 100 UNIT/ML ~~LOC~~ SOPN: 30.0000 [IU] | PEN_INJECTOR | Freq: Every day | SUBCUTANEOUS | 1 refills | Status: DC

## 2022-10-21 NOTE — Progress Notes (Signed)
10/21/2022, 6:26 PM   Endocrinology follow-up note  Subjective:    Patient ID: Ryan Herrera, male    DOB: Sep 08, 1946.  Ryan Herrera is being seen in follow-up after he was feeling consultation for management of currently uncontrolled symptomatic diabetes requested by  Alvira Monday, [place].   Past Medical History:  Diagnosis Date   Arthritis    hands   Coronary artery disease    Diabetes mellitus without complication ([name])    GERD (gastroesophageal reflux disease)    H/O poliomyelitis    age 77.  now has "weak stomach muscles"   Hypertension    IBS (irritable bowel syndrome)    Iron deficiency anemia 09/22/2018   Myocardial infarct [facility])    Renal insufficiency    Thyroid disease     Past Surgical History:  Procedure Laterality Date   [place] SURGERY  10/04/2005   benign tumor removal  2004   CORONARY ARTERY BYPASS GRAFT  2008   [facility] - 3 vessel   CORONARY ARTERY BYPASS GRAFT N/A 03/28/2022   Procedure: REDO CORONARY ARTERY BYPASS GRAFTING (CABG) X2 BYPASSES USING OPEN LEFT RADIAL ARTERY AND ENDOSCOPIC RIGHT GREATER SAPHENOUS VEIN HARVEST;  Surgeon: Melrose Nakayama, MD;  Location: [facility];  Service: Open Heart Surgery;  Laterality: N/A;   CYSTOSCOPY WITH INSERTION OF UROLIFT     HERNIA REPAIR Bilateral    inguinal   HYDROCELE EXCISION / REPAIR     JOINT REPLACEMENT     left knee x2, rright x1   LEFT HEART CATH AND CORS/GRAFTS ANGIOGRAPHY N/A 03/25/2022   Procedure: LEFT HEART CATH AND CORS/GRAFTS ANGIOGRAPHY;  Surgeon: Jettie Booze, MD;  Location: [facility];  Service: Cardiovascular;  Laterality: N/A;   PROSTATE SURGERY N/A 01/2021   TEE WITHOUT CARDIOVERSION N/A 03/28/2022   Procedure: TRANSESOPHAGEAL ECHOCARDIOGRAM (TEE);  Surgeon: Melrose Nakayama, MD;  Location: [place];  Service: Open Heart Surgery;  Laterality: N/A;   TONSILLECTOMY      Social History   Socioeconomic History    Marital status: Divorced    Spouse name: Not on file   Number of children: 1   Years of education: 12   Highest education level: Master's degree (e.g., MA, MS, MEng, MEd, MSW, MBA)  Occupational History   Occupation: retired  Tobacco Use   Smoking status: Former    Packs/day: 1.00    Years: 38.00    Total pack years: 38.00    Types: Cigarettes    Quit date: 12/1996    Years since quitting: 25.8   Smokeless tobacco: Never  Vaping Use   Vaping Use: Never used  Substance and Sexual Activity   Alcohol use: Yes    Comment: occassionally   Drug use: No   Sexual activity: Not Currently  Other Topics Concern   Not on file  Social History Narrative   Lives with his significant other, pt is retired    Scientist, 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[place]  (11/08/2021)   Overall Financial Resource Strain ([place])    Difficulty of Paying Living Expenses: Not hard at [place]: No [place] (11/08/2021)   Hunger Vital Sign    Worried About [place] in the Last Year: Never true    [place] in the Last Year: Never true  Transportation Needs: No Transportation Needs (11/08/2021)   [place] - Transportation  Lack of Transportation (Medical): No    Lack of Transportation (Non-Medical): No  Physical Activity: Inactive (11/08/2021)   Exercise Vital Sign    Days of Exercise per Week: 0 days    Minutes of Exercise per Session: 0 min  Stress: No Stress Concern Present (11/08/2021)   [place]    Feeling of Stress : Not at all  Social Connections: Socially Isolated (11/08/2021)   Social Connection and Isolation Panel [NHANES]    Frequency of Communication with Friends and Family: More than three times a week    Frequency of Social Gatherings with Friends and Family: Once a week    Attends Religious Services: Never    Marine scientist or Organizations: No    Attends Programme researcher, broadcasting/film/video: Never    Marital Status: Divorced    Family History  Problem Relation Age of Onset   Hypertension Mother    Heart attack Mother    Osteoarthritis Mother    Stroke Father    Hypertension Father    Stroke Sister    Stroke Sister    Colon cancer Neg Hx    Prostate cancer Neg Hx    Cancer - Lung Neg Hx     Outpatient Encounter Medications as of 10/21/2022  Medication Sig   glipiZIDE (GLUCOTROL XL) 5 MG 24 hr tablet Take 5 mg by mouth daily with breakfast.   acetaminophen (TYLENOL) 650 MG CR tablet Take 1,300 mg by mouth every 8 (eight) hours as needed for pain.   allopurinol (ZYLOPRIM) 100 MG tablet Take 1 tablet (100 mg total) by mouth daily.   aspirin EC 81 MG tablet Take 81 mg by mouth daily.   Betamethasone Dipropionate Aug (DIPROLENE EX) Apply 1 Application topically daily as needed (rash).   Carboxymethylcellulose Sodium (THERATEARS PF OP) Place 1 drop into both eyes daily as needed (dry eyes).   Cholecalciferol (VITAMIN D) 50 MCG (2000 UT) tablet Take 2,000 Units by mouth daily.   clotrimazole-betamethasone (LOTRISONE) cream Apply 1 Application topically 2 (two) times daily as needed (rash).   cyanocobalamin (VITAMIN B12) 1000 MCG tablet Take 1,000 mcg by mouth daily.   cyclobenzaprine (FLEXERIL) 5 MG tablet Take 1 tablet (5 mg total) by mouth 3 (three) times daily as needed for muscle spasms.   dextromethorphan 15 MG/5ML syrup Take 10 mLs (30 mg total) by mouth 4 (four) times daily as needed for cough.   Evolocumab (REPATHA SURECLICK) 478 MG/ML SOAJ INJECT 140 MG INTO THE SKIN EVERY 14 (FOURTEEN) DAYS.   ezetimibe (ZETIA) 10 MG tablet TAKE 1 TABLET BY MOUTH EVERY DAY   fluticasone (FLONASE) 50 MCG/ACT nasal spray Place 1 spray into both nostrils 2 (two) times daily.   glucose blood test strip 1 each by Other route as needed. Use to check blood glucose twice daily as instructed   guaiFENesin (MUCINEX) 600 MG 12 hr tablet Take 1 tablet (600 mg total) by  mouth 2 (two) times daily.   guaiFENesin (MUCINEX) 600 MG 12 hr tablet Take 1 tablet (600 mg total) by mouth 2 (two) times daily.   hydrochlorothiazide (HYDRODIURIL) 25 MG tablet Take 1 tablet (25 mg total) by mouth daily.   insulin degludec (TRESIBA FLEXTOUCH) 100 UNIT/ML FlexTouch Pen Inject 30 Units into the skin at bedtime.   ketoconazole (NIZORAL) 2 % cream Apply 1 Application topically daily.   ketoconazole (NIZORAL) 2 % shampoo Apply 1 Application topically See admin instructions. Uses a small  amount with every shower   levothyroxine (SYNTHROID) 100 MCG tablet TAKE 1 TABLET BY MOUTH EVERY DAY BEFORE BREAKFAST   Lidocaine HCl (LIDOCAINE PLUS EX) Apply 1 Application topically daily as needed (itching/pain).   metFORMIN (GLUCOPHAGE) 1000 MG tablet Take 1 tablet (1,000 mg total) by mouth 2 (two) times daily with a meal.   metoprolol succinate (TOPROL-XL) 25 MG 24 hr tablet Take 1 tablet (25 mg total) by mouth daily.   omeprazole (PRILOSEC) 20 MG capsule TAKE 1 CAPSULE BY MOUTH EVERY DAY   pantoprazole (PROTONIX) 40 MG tablet Take 1 tablet (40 mg total) by mouth daily.   promethazine-dextromethorphan (PROMETHAZINE-DM) 6.25-15 MG/5ML syrup Take 5 mLs by mouth 4 (four) times daily as needed.   ranolazine (RANEXA) 500 MG 12 hr tablet TAKE 1 TABLET BY MOUTH TWICE A DAY   tadalafil (CIALIS) 5 MG tablet Take 5 mg by mouth daily.   triamcinolone cream (KENALOG) 0.5 % Apply 1 Application topically 2 (two) times daily as needed (tick bites).   UNABLE TO FIND Lift chair DX: M47.812   UNABLE TO FIND Blood pressure cuff DX: I10   valACYclovir (VALTREX) 500 MG tablet TAKE 1 TABLET BY MOUTH TWICE A DAY   [DISCONTINUED] BD PEN NEEDLE NANO U/F 32G X 4 MM MISC    [DISCONTINUED] glipiZIDE (GLUCOTROL) 5 MG tablet Take 1 tablet (5 mg total) by mouth 2 (two) times daily before a meal.   [DISCONTINUED] rosuvastatin (CRESTOR) 5 MG tablet Take 5 mg by mouth daily.   [DISCONTINUED] TRESIBA FLEXTOUCH 100 UNIT/ML  FlexTouch Pen INJECT 20 UNITS INTO THE SKIN AT BEDTIME. (Patient taking differently: Inject 25 Units into the skin at bedtime.)   No facility-administered encounter medications on file as of 10/21/2022.    ALLERGIES: Allergies  Allergen Reactions   Myrbetriq [Mirabegron] Nausea And Vomiting   Jardiance [Empagliflozin]    Niacin And Related Itching   Nsaids     Kidney damage    Other Diarrhea and Nausea And Vomiting    PT does not want OPIOIDS   GLP-1 drugs - Nausea, vomiting, diarrhea, belching   Statins Itching    VACCINATION STATUS: Immunization History  Administered Date(s) Administered   Fluad Quad(high Dose 65+) 06/14/2022   Influenza Split 07/19/2013   Influenza, High Dose Seasonal PF 06/13/2015, 07/18/2016, 06/11/2017, 07/08/2018, 07/02/2019, 09/15/2020, 07/27/2021   Influenza,inj,Quad PF,6+ Mos 08/08/2014   Influenza-Unspecified 07/15/2012, 07/15/2013, 08/08/2014, 06/13/2015, 07/18/2016   PFIZER Comirnaty(Gray Top)Covid-19 Tri-Sucrose Vaccine 11/27/2019, 12/19/2019, 07/26/2020, 04/20/2021   PPD Test 01/22/2013, 01/22/2013   Pfizer Covid-19 Vaccine Bivalent Booster 66yr & up 07/27/2021   Pneumococcal Conjugate-13 05/04/2014   Pneumococcal Polysaccharide-23 07/15/2012   Td 02/15/2003   Tdap 05/04/2014, 03/09/2022   Zoster Recombinat (Shingrix) 02/12/2017, 02/13/2017, 06/04/2017    Diabetes He presents for his follow-up diabetic visit. He has type 2 diabetes mellitus. Onset time: He was diagnosed at approximate age of 89years. His disease course has been worsening. There are no hypoglycemic associated symptoms. Pertinent negatives for hypoglycemia include no confusion, headaches, pallor or seizures. Pertinent negatives for diabetes include no chest pain, no fatigue, no polydipsia, no polyphagia, no polyuria and no weakness. There are no hypoglycemic complications. Symptoms are worsening. Diabetic complications include heart disease. Risk factors for coronary artery disease  include dyslipidemia, diabetes mellitus, family history, obesity, male sex, hypertension, tobacco exposure and sedentary lifestyle. Current diabetic treatment includes insulin injections (He is currently on Tresiba 30 units daily, metformin 1000 mg p.o. twice daily, glipizide 10 mg p.o. twice daily,  Jardiance 10 mg p.o. once a day.). His weight is increasing steadily. He is following a generally unhealthy diet. When asked about meal planning, he reported none. He rarely participates in exercise. His home blood glucose trend is increasing steadily. His breakfast blood glucose range is generally 140-180 mg/dl. His overall blood glucose range is 140-180 mg/dl. Ryan Herrera presents with loss of control of his glycemia, 50% of the time at fasting he is hyperglycemic.  His point-of-care A1c is 8.4% increasing from 7.3% to his last visit.  He has occasional mild hypoglycemic episodes.  His glipizide was increased to 5 mg p.o. twice daily in the interim.  ) An ACE inhibitor/angiotensin II receptor blocker is being taken.  Hyperlipidemia This is a chronic problem. The current episode started more than 1 year ago. The problem is controlled. Exacerbating diseases include diabetes. Pertinent negatives include no chest pain, myalgias or shortness of breath. Treatments tried: He is on Repatha injection as well as Zetia 10 mg p.o. daily. Risk factors for coronary artery disease include dyslipidemia, diabetes mellitus, family history, obesity, male sex, hypertension and a sedentary lifestyle.  Hypertension This is a chronic problem. The current episode started more than 1 year ago. Pertinent negatives include no chest pain, headaches, neck pain, palpitations or shortness of breath. Risk factors for coronary artery disease include family history, dyslipidemia, diabetes mellitus, male gender, obesity and sedentary lifestyle. Past treatments include angiotensin blockers. Hypertensive end-organ damage includes CAD/MI.     Review  of Systems  Constitutional:  Negative for chills, fatigue, fever and unexpected weight change.  HENT:  Negative for dental problem, mouth sores and trouble swallowing.   Eyes:  Negative for visual disturbance.  Respiratory:  Negative for cough, choking, chest tightness, shortness of breath and wheezing.   Cardiovascular:  Negative for chest pain, palpitations and leg swelling.  Gastrointestinal:  Negative for abdominal distention, abdominal pain, constipation, diarrhea, nausea and vomiting.  Endocrine: Negative for polydipsia, polyphagia and polyuria.  Genitourinary:  Negative for dysuria, flank pain, hematuria and urgency.  Musculoskeletal:  Negative for back pain, gait problem, myalgias and neck pain.  Skin:  Negative for pallor, rash and wound.  Neurological:  Negative for seizures, syncope, weakness, numbness and headaches.  Psychiatric/Behavioral:  Negative for confusion and dysphoric mood.     Objective:       10/21/2022    1:53 PM 10/08/2022   10:22 AM 10/08/2022   10:17 AM  Vitals with BMI  Height '5\' 6"'$   '5\' 6"'$   Weight 195 lbs  192 lbs  BMI 33.35  31  Systolic 456 256 389  Diastolic 76 72 84  Pulse 80  73    BP (!) 148/76   Pulse 80   Ht '5\' 6"'$  (1.676 m)   Wt 195 lb (88.5 kg)   BMI 31.47 kg/m   Wt Readings from Last 3 Encounters:  10/21/22 195 lb (88.5 kg)  10/08/22 192 lb (87.1 kg)  09/13/22 185 lb 1.3 oz (84 kg)     CMP ( most recent) CMP     Component Value Date/Time   NA 133 (L) 10/02/2022 1442   NA 139 04/29/2014 1500   K 4.1 10/02/2022 1442   K 4.2 04/29/2014 1500   CL 98 10/02/2022 1442   CL 103 04/29/2014 1500   CO2 21 10/02/2022 1442   CO2 27 04/29/2014 1500   GLUCOSE 155 (H) 10/02/2022 1442   GLUCOSE 196 (H) 06/11/2022 1859   GLUCOSE 111 (H) 04/29/2014 1500   BUN  16 10/02/2022 1442   BUN 16 04/29/2014 1500   CREATININE 0.98 10/02/2022 1442   CREATININE 1.14 04/29/2014 1500   CALCIUM 9.6 10/02/2022 1442   CALCIUM 9.2 04/29/2014 1500   PROT 6.8  10/02/2022 1442   ALBUMIN 3.9 10/02/2022 1442   AST 24 10/02/2022 1442   ALT 23 10/02/2022 1442   ALKPHOS 93 10/02/2022 1442   BILITOT 0.3 10/02/2022 1442   GFRNONAA >60 06/11/2022 1859   GFRNONAA >60 04/29/2014 1500   GFRAA >60 05/12/2020 1848   GFRAA >60 04/29/2014 1500     Diabetic Labs (most recent): Lab Results  Component Value Date   HGBA1C 8.4 (H) 10/02/2022   HGBA1C 7.3 (A) 06/20/2022   HGBA1C 7.6 (H) 03/23/2022     Lipid Panel ( most recent) Lipid Panel     Component Value Date/Time   CHOL 140 10/02/2022 1442   TRIG 66 10/02/2022 1442   HDL 43 10/02/2022 1442   CHOLHDL 3.3 10/02/2022 1442   LDLCALC 84 10/02/2022 1442   LABVLDL 13 10/02/2022 1442      Lab Results  Component Value Date   TSH 2.990 10/02/2022   TSH 2.349 03/24/2022   TSH 5.760 (H) 03/12/2022   TSH 4.124 12/21/2018   TSH 5.545 (H) 09/22/2018   TSH 4.84 (H) 04/29/2014   FREET4 1.44 10/02/2022   FREET4 1.14 (H) 03/24/2022   FREET4 1.01 12/21/2018      Assessment & Plan:   1. DM type 2 causing vascular disease (Ryan Herrera)  - Ryan Herrera has currently uncontrolled symptomatic type 2 DM since  77 years of age.  Ryan Herrera presents with loss of control of his glycemia, 50% of the time at fasting he is hyperglycemic.  His point-of-care A1c is 8.4% increasing from 7.3% to his last visit.  He has occasional mild hypoglycemic episodes.  His glipizide was increased to 5 mg p.o. twice daily in the interim.     Recent labs reviewed. His average blood glucose of 168 in his meter. - I had a long discussion with him about the progressive nature of diabetes and the pathology behind its complications. -his diabetes is complicated by coronary artery disease status post cardiac bypass in 2008, recurrent coronary artery disease, obesity/sedentary life, comorbid hypertension /hyperlipidemia, history of smoking and he remains at a high risk for more acute and chronic complications which include CAD, CVA, CKD,  retinopathy, and neuropathy. These are all discussed in detail with him.  - I discussed all available options of managing his diabetes including de-escalation of medications. I have counseled him on diet  and weight management  by adopting a Whole Food , Plant Predominant  ( WFPP) nutrition as recommended by [employer] of Lifestyle Medicine. Patient is encouraged to switch to  unprocessed or minimally processed  complex starch, adequate protein intake (mainly plant source), minimal liquid fat ( mainly vegetable oils), plenty of fruits, and vegetables.  -  he is advised to stick to a routine mealtimes to eat 3 complete meals a day and snack only when necessary ( to snack only to correct hypoglycemia BG <70 day time or <100 at night).   - he acknowledges that there is a room for improvement in his food and drink choices. - Suggestion is made for him to avoid simple carbohydrates  from his diet including Cakes, Sweet Desserts, Ice Cream, Soda (diet and regular), Sweet Tea, Candies, Chips, Cookies, Store Bought Juices, Alcohol , Artificial Sweeteners,  Coffee Creamer, and "Sugar-free" Products, Lemonade. This  will help patient to have more stable blood glucose profile and potentially avoid unintended weight gain.  The following Lifestyle Medicine recommendations according to [place]  [facility]) were discussed and and offered to patient and he  agrees to start the journey:  A. Whole Foods, Plant-Based Nutrition comprising of fruits and vegetables, plant-based proteins, whole-grain carbohydrates was discussed in detail with the patient.   A list for source of those nutrients were also provided to the patient.  Patient will use only water or unsweetened tea for hydration. B.  The need to stay away from risky substances including alcohol, smoking; obtaining 7 to 9 hours of restorative sleep, at least 150 minutes of moderate intensity exercise weekly, the importance of healthy  social connections,  and stress management techniques were discussed. C.  A full color page of  Calorie density of various food groups per pound showing examples of each food groups was provided to the patient.   - he will be scheduled with Jearld Fenton, RDN, CDE for individualized diabetes education.  - I have approached him with the following plan to manage  his diabetes and patient agrees:   -Based on his presentation with above target fasting glycemic profile, he will need a higher dose of Tresiba.  I discussed and increase his Tyler Aas to 30 units nightly, associated with monitoring of blood glucose twice a day-daily before breakfast and at bedtime. I advised him to lower his glipizide to 5 mg XL p.o. daily at breakfast.  He will continue to benefit from low-dose metformin, 500 mg p.o. twice daily.    - he is encouraged to call clinic for blood glucose levels less than 70 or above 200 mg /dl fasting x3/week.   - Specific targets for  A1c;  LDL, HDL,  and Triglycerides were discussed with the patient.  2) Blood Pressure /Hypertension:   -His blood pressure is not controlled to target.  he is advised to continue his current medications including losartan 25 mg p.o. daily with breakfast .   3) Lipids/Hyperlipidemia:   Review of his recent lipid panel showed controlled LDL at 84.    He does not tolerate statins.  He is advised to continue Repatha 140 mg every 14 days, Zetia 10 mg every night.   Side effects and precautions discussed with him.  If he indeed engages enough for  whole food , plant-based diet, he had a chance to come off of these antilipid medications.  4)  Weight/Diet:  Body mass index is 31.47 kg/m.  -   clearly complicating his diabetes care.   he is  a candidate for weight loss. I discussed with him the fact that loss of 5 - 10% of his  current body weight will have the most impact on his diabetes management.  The above detailed  ACLM recommendations for nutrition,  exercise, sleep, social life, avoidance of risky substances, the need for restorative sleep   information will also detailed on discharge instructions.  5) Chronic Care/Health Maintenance:  -he  is on [facility]B  medications and  is encouraged to initiate and continue to follow up with Ophthalmology, Dentist,  Podiatrist at least yearly or according to recommendations, and advised to   stay away from smoking. I have recommended yearly flu vaccine and pneumonia vaccine at least every 5 years; moderate intensity exercise for up to 150 minutes weekly; and  sleep for 7- 9 hours a day.   6) hypothyroidism: Circumstance of diagnosis not available  to review.  His recent TSH was slightly above target.  He is currently on levothyroxine 100 mcg p.o. daily before breakfast.  He is advised to continue on the same dose.   - We discussed about the correct intake of his thyroid hormone, on empty stomach at fasting, with water, separated by at least 30 minutes from breakfast and other medications,  and separated by more than 4 hours from calcium, iron, multivitamins, acid reflux medications (PPIs). -Patient is made aware of the fact that thyroid hormone replacement is needed for life, dose to be adjusted by periodic monitoring of thyroid function tests.   - he is  advised to maintain close follow up with Alvira Monday, FNP for primary care needs, as well as his other providers for optimal and coordinated care.   I spent 26 minutes in the care of the patient today including review of labs from [place], Lipids, Thyroid Function, Hematology (current and previous including abstractions from other facilities); face-to-face time discussing  his blood glucose readings/logs, discussing hypoglycemia and hyperglycemia episodes and symptoms, medications doses, his options of short and long term treatment based on the latest standards of care / guidelines;  discussion about incorporating lifestyle medicine;  and documenting the  encounter. Risk reduction counseling performed per USPSTF guidelines to reduce  obesity and cardiovascular risk factors.     Please refer to Patient Instructions for Blood Glucose Monitoring and Insulin/Medications Dosing Guide"  in media tab for additional information. Please  also refer to " Patient Self Inventory" in the Media  tab for reviewed elements of pertinent patient history.  Ryan Herrera participated in the discussions, expressed understanding, and voiced agreement with the above plans.  All questions were answered to his satisfaction. he is encouraged to contact clinic should he have any questions or concerns prior to his return visit.    Follow up plan: - Return in about 3 months (around 01/20/2023) for Bring Meter/CGM Device/Logs- A1c in Office.  Glade Lloyd, MD [facility] [address] [place], [place] [address] Phone: [phone]  Fax: [phone]    10/21/2022, 6:26 PM  This note was partially dictated with voice recognition software. Similar sounding words can be transcribed inadequately or may not  be corrected upon review.

## 2022-10-21 NOTE — Patient Instructions (Signed)

## 2022-10-22 ENCOUNTER — Telehealth: Payer: Self-pay | Admitting: *Deleted

## 2022-10-22 NOTE — Telephone Encounter (Signed)
Pt called and wanted to know the address to come to for his appt on 10/24/22. Pt advised to come to [address]. Verbalized understanding.

## 2022-10-23 ENCOUNTER — Ambulatory Visit: Payer: Medicare HMO | Admitting: Internal Medicine
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
## 2022-10-24 ENCOUNTER — Encounter: Payer: Self-pay | Admitting: Gastroenterology

## 2022-10-24 ENCOUNTER — Ambulatory Visit (INDEPENDENT_AMBULATORY_CARE_PROVIDER_SITE_OTHER): Payer: Medicare HMO | Admitting: Gastroenterology

## 2022-10-24 VITALS — BP 124/75 | HR 72 | Temp 98.9°F | Ht 66.0 in | Wt 195.2 lb

## 2022-10-24 DIAGNOSIS — R197 Diarrhea, unspecified: Secondary | ICD-10-CM | POA: Diagnosis not present

## 2022-10-24 DIAGNOSIS — K219 Gastro-esophageal reflux disease without esophagitis: Secondary | ICD-10-CM | POA: Diagnosis not present

## 2022-10-24 DIAGNOSIS — D649 Anemia, unspecified: Secondary | ICD-10-CM | POA: Diagnosis not present

## 2022-10-24 DIAGNOSIS — Z1211 Encounter for screening for malignant neoplasm of colon: Secondary | ICD-10-CM

## 2022-10-24 DIAGNOSIS — R194 Change in bowel habit: Secondary | ICD-10-CM | POA: Diagnosis not present

## 2022-10-24 NOTE — Patient Instructions (Addendum)
We are scheduling you for a colonoscopy and upper endoscopy with Dr. Abbey Chatters in the near future.  You will receive separate written instructions regarding your prep as well as medication adjustments. In summary: Hold metformin night before and morning of Hold glipizide morning of 1/2 normal dose of Tresiba night prior   I suspect some of your looser stools may be secondary to overflow from baseline constipation therefore I want you to start taking a daily fiber supplement such as Benefiber.  You may take 2-3 teaspoons daily in 8 ounces of fluid of your choice.  If you do not want to drink the powder you may get Gummies or any over-the-counter fiber capsule.  You may start this whenever.  Your most recent blood work is reassuring including your hemoglobin as well as your thyroid therefore I do not suspect that this is contributing at all to your diarrhea.  I would like to repeat your hemoglobin and your iron panel in 1 month to ensure your hemoglobin is continuing to return back to normal.   It was a pleasure to see you today. I want to create trusting relationships with patients. If you receive a survey regarding your visit,  I greatly appreciate you taking time to fill this out on paper or through your MyChart. I value your feedback.  Venetia Night, MSN, FNP-BC, AGACNP-BC [facility]

## 2022-10-25 ENCOUNTER — Telehealth: Payer: Self-pay | Admitting: *Deleted

## 2022-10-25 NOTE — Telephone Encounter (Signed)
[facility]  'EGD/TCS with Dr.Carver ASA 3

## 2022-10-28 ENCOUNTER — Ambulatory Visit: Payer: Medicare HMO | Attending: Internal Medicine | Admitting: Internal Medicine

## 2022-10-28 ENCOUNTER — Telehealth: Payer: Self-pay | Admitting: *Deleted

## 2022-10-28 ENCOUNTER — Encounter: Payer: Self-pay | Admitting: Internal Medicine

## 2022-10-28 VITALS — BP 146/68 | HR 78 | Ht 66.5 in | Wt 195.0 lb

## 2022-10-28 DIAGNOSIS — I2510 Atherosclerotic heart disease of native coronary artery without angina pectoris: Secondary | ICD-10-CM

## 2022-10-28 MED ORDER — CARVEDILOL 6.25 MG PO TABS: 6.2500 mg | ORAL_TABLET | Freq: Two times a day (BID) | ORAL | 3 refills | Status: DC
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
## 2022-10-28 NOTE — Telephone Encounter (Signed)
Pt needs to have cardiac clearance prior to scheduling procedures. Informed pt and he says he is seeing her today and will tell her about the clearance being faxed over.

## 2022-10-28 NOTE — Patient Instructions (Signed)
Medication Instructions:  Your physician has recommended you make the following change in your medication:  STOP: Metoprolol  Start: Carvedilol 6.25 Twice daily  *If you need a refill on your cardiac medications before your next appointment, please call your pharmacy*   Lab Work: NONE If you have labs (blood work) drawn today and your tests are completely normal, you will receive your results only by: [place] (if you have MyChart) OR A paper copy in the mail If you have any lab test that is abnormal or we need to change your treatment, we will call you to review the results.   Testing/Procedures: NONE   Follow-Up: At [facility], you and your health needs are our priority.  As part of our continuing mission to provide you with exceptional heart care, we have created designated Provider Care Teams.  These Care Teams include your primary Cardiologist (physician) and Advanced Practice Providers (APPs -  Physician Assistants and Nurse Practitioners) who all work together to provide you with the care you need, when you need it.  We recommend signing up for the patient portal called "MyChart".  Sign up information is provided on this After Visit Summary.  MyChart is used to connect with patients for Virtual Visits (Telemedicine).  Patients are able to view lab/test results, encounter notes, upcoming appointments, etc.  Non-urgent messages can be sent to your provider as well.   To learn more about what you can do with MyChart, go to [email or web address].    Your next appointment:   6 month(s)  Provider:   Janina Mayo, MD

## 2022-10-28 NOTE — Telephone Encounter (Signed)
  Request for patient to stop medication prior to procedure or is needing cleareance  10/28/22  Ryan Herrera 04/07/1946  What type of surgery is being performed? Colonoscopy and Esophagogastroduodenoscopy  When is surgery scheduled? TBD  What type of clearance is required (medical or pharmacy to hold medication or both? Medical  Patient needs to have cardiac clearance before we can schedule procedures.  Name of physician performing surgery?  Dr. Hurshel [facility] at [facility] Phone: [phone] Fax: [phone]  Anethesia type (none, local, MAC, general)? MAC

## 2022-10-29 NOTE — Telephone Encounter (Signed)
   Patient Name: Ryan Herrera  DOB: [birth year] MRN: [id number]  Primary Cardiologist: Janina Mayo, MD  Chart reviewed as part of pre-operative protocol coverage. Given past medical history and time since last visit, based on ACC/AHA guidelines, [name] is at acceptable risk for the planned procedure without further cardiovascular testing.   Pt was seen in the office on 10/28/2022 by Dr. Phineas Inches and was cleared for colonoscopy/EGD.  I will route this recommendation as well as the note from pt's most recent OV to the requesting party via [place] fax function and remove from pre-op pool.  Please call with questions.  Lenna Sciara, NP 10/29/2022, 4:22 PM

## 2022-10-30 ENCOUNTER — Encounter: Payer: Self-pay | Admitting: *Deleted

## 2022-10-30 ENCOUNTER — Telehealth: Payer: Self-pay | Admitting: Gastroenterology

## 2022-10-30 MED ORDER — PEG 3350-KCL-NA BICARB-NACL 420 G PO SOLR: 4000.0000 mL | Freq: Once | ORAL | 0 refills | Status: AC

## 2022-10-30 NOTE — Telephone Encounter (Signed)
Pt has been scheduled for 11/18/22 with Dr.Carver. Instructions mailed and prep sent to pharmacy.   [place] PA: Approved Authorization #[id number]  Tracking #[id number]  DOS:11/18/22-02/17/23

## 2022-10-30 NOTE — Telephone Encounter (Signed)
error 

## 2022-10-30 NOTE — Telephone Encounter (Signed)
Reviewed cardiac visit note.  Per cardiology he is acceptable risk to undergo colonoscopy and upper endoscopy. May proceed with scheduling.   Venetia Night, MSN, APRN, FNP-BC, AGACNP-BC [facility] at [facility]

## 2022-11-14 ENCOUNTER — Encounter (HOSPITAL_COMMUNITY): Admission: RE | Admit: 2022-11-14 | Payer: No Typology Code available for payment source | Source: Ambulatory Visit

## 2022-11-15 ENCOUNTER — Encounter (HOSPITAL_COMMUNITY)
Admission: RE | Admit: 2022-11-15 | Discharge: 2022-11-15 | Disposition: A | Discharge status: Home / Self Care | Payer: Medicare HMO | Source: Ambulatory Visit | Attending: Internal Medicine | Admitting: Internal Medicine

## 2022-11-18 ENCOUNTER — Ambulatory Visit (HOSPITAL_BASED_OUTPATIENT_CLINIC_OR_DEPARTMENT_OTHER): Payer: Medicare HMO | Admitting: Certified Registered Nurse Anesthetist

## 2022-11-18 ENCOUNTER — Encounter (HOSPITAL_COMMUNITY)
Admission: RE | Disposition: A | Discharge status: Home / Self Care | Payer: Self-pay | Source: Ambulatory Visit | Attending: Internal Medicine

## 2022-11-18 ENCOUNTER — Ambulatory Visit (HOSPITAL_COMMUNITY)
Admission: RE | Admit: 2022-11-18 | Discharge: 2022-11-18 | Disposition: A | Discharge status: Home / Self Care | Payer: Medicare HMO | Source: Ambulatory Visit | Attending: Internal Medicine | Admitting: Internal Medicine

## 2022-11-18 ENCOUNTER — Ambulatory Visit (HOSPITAL_COMMUNITY): Payer: Medicare HMO | Admitting: Certified Registered Nurse Anesthetist

## 2022-11-18 ENCOUNTER — Encounter (HOSPITAL_COMMUNITY): Payer: Self-pay

## 2022-11-18 DIAGNOSIS — Z87891 Personal history of nicotine dependence: Secondary | ICD-10-CM | POA: Insufficient documentation

## 2022-11-18 DIAGNOSIS — E119 Type 2 diabetes mellitus without complications: Secondary | ICD-10-CM | POA: Diagnosis not present

## 2022-11-18 DIAGNOSIS — K2970 Gastritis, unspecified, without bleeding: Secondary | ICD-10-CM

## 2022-11-18 DIAGNOSIS — I10 Essential (primary) hypertension: Secondary | ICD-10-CM | POA: Insufficient documentation

## 2022-11-18 DIAGNOSIS — D509 Iron deficiency anemia, unspecified: Secondary | ICD-10-CM | POA: Diagnosis present

## 2022-11-18 DIAGNOSIS — Z951 Presence of aortocoronary bypass graft: Secondary | ICD-10-CM | POA: Diagnosis not present

## 2022-11-18 DIAGNOSIS — K635 Polyp of colon: Secondary | ICD-10-CM

## 2022-11-18 DIAGNOSIS — Z7982 Long term (current) use of aspirin: Secondary | ICD-10-CM | POA: Insufficient documentation

## 2022-11-18 DIAGNOSIS — Z794 Long term (current) use of insulin: Secondary | ICD-10-CM | POA: Diagnosis not present

## 2022-11-18 DIAGNOSIS — K589 Irritable bowel syndrome without diarrhea: Secondary | ICD-10-CM | POA: Insufficient documentation

## 2022-11-18 DIAGNOSIS — I2510 Atherosclerotic heart disease of native coronary artery without angina pectoris: Secondary | ICD-10-CM | POA: Diagnosis not present

## 2022-11-18 DIAGNOSIS — K648 Other hemorrhoids: Secondary | ICD-10-CM | POA: Diagnosis not present

## 2022-11-18 DIAGNOSIS — I252 Old myocardial infarction: Secondary | ICD-10-CM | POA: Insufficient documentation

## 2022-11-18 DIAGNOSIS — D125 Benign neoplasm of sigmoid colon: Secondary | ICD-10-CM | POA: Diagnosis not present

## 2022-11-18 DIAGNOSIS — D122 Benign neoplasm of ascending colon: Secondary | ICD-10-CM | POA: Insufficient documentation

## 2022-11-18 DIAGNOSIS — Z7984 Long term (current) use of oral hypoglycemic drugs: Secondary | ICD-10-CM | POA: Insufficient documentation

## 2022-11-18 DIAGNOSIS — D124 Benign neoplasm of descending colon: Secondary | ICD-10-CM | POA: Insufficient documentation

## 2022-11-18 DIAGNOSIS — K5730 Diverticulosis of large intestine without perforation or abscess without bleeding: Secondary | ICD-10-CM | POA: Insufficient documentation

## 2022-11-18 DIAGNOSIS — E039 Hypothyroidism, unspecified: Secondary | ICD-10-CM | POA: Insufficient documentation

## 2022-11-18 DIAGNOSIS — K219 Gastro-esophageal reflux disease without esophagitis: Secondary | ICD-10-CM | POA: Insufficient documentation

## 2022-11-18 DIAGNOSIS — Z09 Encounter for follow-up examination after completed treatment for conditions other than malignant neoplasm: Secondary | ICD-10-CM | POA: Insufficient documentation

## 2022-11-18 HISTORY — PX: BIOPSY: SHX5522

## 2022-11-18 HISTORY — PX: COLONOSCOPY WITH PROPOFOL: SHX5780

## 2022-11-18 HISTORY — PX: ESOPHAGOGASTRODUODENOSCOPY (EGD) WITH PROPOFOL: SHX5813

## 2022-11-18 HISTORY — PX: POLYPECTOMY: SHX5525

## 2022-11-18 LAB — GLUCOSE, CAPILLARY: Glucose-Capillary: 93 mg/dL (ref 70–99)

## 2022-11-18 SURGERY — COLONOSCOPY WITH PROPOFOL
Anesthesia: General

## 2022-11-18 MED ORDER — PROPOFOL 500 MG/50ML IV EMUL
INTRAVENOUS | Status: DC | PRN
  Administered 2022-11-18: 150 ug/kg/min via INTRAVENOUS

## 2022-11-18 MED ORDER — LIDOCAINE HCL (CARDIAC) PF 100 MG/5ML IV SOSY
PREFILLED_SYRINGE | INTRAVENOUS | Status: DC | PRN
  Administered 2022-11-18: 50 mg via INTRAVENOUS

## 2022-11-18 MED ORDER — LACTATED RINGERS IV SOLN: INTRAVENOUS | Status: DC | PRN

## 2022-11-18 MED ORDER — PROPOFOL 10 MG/ML IV BOLUS
INTRAVENOUS | Status: DC | PRN
  Administered 2022-11-18: 100 mg via INTRAVENOUS
  Administered 2022-11-18: 20 mg via INTRAVENOUS

## 2022-11-18 NOTE — Interval H&P Note (Signed)
History and Physical Interval Note:  11/18/2022 12:15 PM  Ryan Herrera  has presented today for surgery, with the diagnosis of diarrhea,change in bowel habits,anemia,GERD.  The various methods of treatment have been discussed with the patient and family. After consideration of risks, benefits and other options for treatment, the patient has consented to  Procedure(s) with comments: COLONOSCOPY WITH PROPOFOL (N/A) - 8:30 am, pt did not want early appt. moved down (per PAT) ESOPHAGOGASTRODUODENOSCOPY (EGD) WITH PROPOFOL (N/A) as a surgical intervention.  The patient's history has been reviewed, patient examined, no change in status, stable for surgery.  I have reviewed the patient's chart and labs.  Questions were answered to the patient's satisfaction.     Eloise Harman

## 2022-11-18 NOTE — Op Note (Signed)
[facility] Patient Name: Ryan Herrera Procedure Date: 11/18/2022 12:17 PM MRN: [id number] Date of Birth: 1945/12/15 Attending MD: Elon Alas. Edgar Frisk, [id number] CSN: [id number] Age: 77 Admit Type: Outpatient Procedure:                Colonoscopy Indications:              Chronic diarrhea, Iron deficiency anemia Providers:                Elon Alas. Abbey Chatters, DO, Caprice Kluver, Aram Candela Referring MD:              Medicines:                See the Anesthesia note for documentation of the                            administered medications Complications:            No immediate complications. Estimated Blood Loss:     Estimated blood loss was minimal. Procedure:                Pre-Anesthesia Assessment:                           - The anesthesia plan was to use monitored                            anesthesia care (MAC).                           After obtaining informed consent, the colonoscope                            was passed under direct vision. Throughout the                            procedure, the patient's blood pressure, pulse, and                            oxygen saturations were monitored continuously. The                            [id number]) scope was introduced through                            the anus and advanced to the the terminal ileum,                            with identification of the appendiceal orifice and                            IC valve. The colonoscopy was performed without                            difficulty. The patient tolerated the procedure  well. The quality of the bowel preparation was                            evaluated using the BBPS [facility] Bowel Preparation                            Scale) with scores of: Right Colon = 3, Transverse                            Colon = 3 and Left Colon = 3 (entire mucosa seen                            well with no residual staining, small fragments of                             stool or opaque liquid). The total BBPS score                            equals 9. Scope In: 12:40:45 PM Scope Out: 12:58:10 PM Scope Withdrawal Time: 0 hours 14 minutes 50 seconds  Total Procedure Duration: 0 hours 17 minutes 25 seconds  Findings:      The perianal and digital rectal examinations were normal.      Non-bleeding internal hemorrhoids were found during endoscopy.      A few medium-mouthed diverticula were found in the sigmoid colon.      Two sessile polyps were found in the ascending colon. The polyps were 5       to 7 mm in size. These polyps were removed with a cold snare. Resection       and retrieval were complete.      Three sessile polyps were found in the sigmoid colon and descending       colon. The polyps were 4 to 6 mm in size. These polyps were removed with       a cold snare. Resection and retrieval were complete.      Biopsies for histology were taken with a cold forceps from the ascending       colon, transverse colon and descending colon for evaluation of       microscopic colitis.      The terminal ileum appeared normal. Impression:               - Non-bleeding internal hemorrhoids.                           - Diverticulosis in the sigmoid colon.                           - Two 5 to 7 mm polyps in the ascending colon,                            removed with a cold snare. Resected and retrieved.                           - Three 4 to 6 mm polyps in the sigmoid colon and  in the descending colon, removed with a cold snare.                            Resected and retrieved.                           - The examined portion of the ileum was normal.                           - Biopsies were taken with a cold forceps from the                            ascending colon, transverse colon and descending                            colon for evaluation of microscopic colitis. Moderate Sedation:      Per Anesthesia  Care Recommendation:           - Patient has a contact number available for                            emergencies. The signs and symptoms of potential                            delayed complications were discussed with the                            patient. Return to normal activities tomorrow.                            Written discharge instructions were provided to the                            patient.                           - Resume previous diet.                           - Continue present medications.                           - Await pathology results.                           - No repeat colonoscopy due to age.                           - Return to GI clinic in 3 months. Procedure Code(s):        --- Professional ---                           [id number], Colonoscopy, flexible; with removal of                            tumor(s), polyp(s), or other  lesion(s) by snare                            technique                           45380, 59, Colonoscopy, flexible; with biopsy,                            single or multiple Diagnosis Code(s):        --- Professional ---                           K64.8, Other hemorrhoids                           D12.2, Benign neoplasm of ascending colon                           D12.5, Benign neoplasm of sigmoid colon                           D12.4, Benign neoplasm of descending colon                           K52.9, Noninfective gastroenteritis and colitis,                            unspecified                           D50.9, Iron deficiency anemia, unspecified                           K57.30, Diverticulosis of large intestine without                            perforation or abscess without bleeding CPT copyright 2022 American Medical Association. All rights reserved. The codes documented in this report are preliminary and upon coder review may  be revised to meet current compliance requirements. Elon Alas. Abbey Chatters, DO [place] Abbey Chatters,  DO 11/18/2022 1:01:52 PM This report has been signed electronically. Number of Addenda: 0

## 2022-11-18 NOTE — Transfer of Care (Signed)
Immediate Anesthesia Transfer of Care Note  Patient: Ryan Herrera  Procedure(s) Performed: COLONOSCOPY WITH PROPOFOL ESOPHAGOGASTRODUODENOSCOPY (EGD) WITH PROPOFOL BIOPSY POLYPECTOMY  Patient Location: Short Stay  Anesthesia Type:General  Level of Consciousness: drowsy  Airway & Oxygen Therapy: Patient Spontanous Breathing  Post-op Assessment: Report given to RN and Post -op Vital signs reviewed and stable  Post vital signs: Reviewed and stable  Last Vitals:  Vitals Value Taken Time  BP    Temp 36.6 C 11/18/22 1302  Pulse 66 11/18/22 1302  Resp 15 11/18/22 1302  SpO2 95 % 11/18/22 1302    Last Pain:  Vitals:   11/18/22 1302  TempSrc: Oral  PainSc: 0-No pain         Complications: No notable events documented.

## 2022-11-18 NOTE — Op Note (Signed)
[facility] Patient Name: Ryan Herrera Procedure Date: 11/18/2022 12:18 PM MRN: [id number] Date of Birth: [birth year] Attending MD: Elon Alas. Abbey Chatters , [place], [id number] CSN: [id number] Age: 77 Admit Type: Outpatient Procedure:                Upper GI endoscopy Indications:              Iron deficiency anemia Providers:                Elon Alas. Abbey Chatters, DO, Caprice Kluver, Aram Candela Referring MD:              Medicines:                See the Anesthesia note for documentation of the                            administered medications Complications:            No immediate complications. Estimated Blood Loss:     Estimated blood loss was minimal. Procedure:                Pre-Anesthesia Assessment:                           - The anesthesia plan was to use monitored                            anesthesia care (MAC).                           After obtaining informed consent, the endoscope was                            passed under direct vision. Throughout the                            procedure, the patient's blood pressure, pulse, and                            oxygen saturations were monitored continuously. The                            [id number]) scope was introduced through the                            mouth, and advanced to the second part of duodenum.                            The upper GI endoscopy was accomplished without                            difficulty. The patient tolerated the procedure                            well. Scope In: 12:30:53 PM Scope Out: 12:35:33 PM Total Procedure Duration: 0 hours 4 minutes 40 seconds  Findings:      The Z-line was regular and was found 39  cm from the incisors.      Patchy mild inflammation characterized by erythema was found in the       gastric body and in the gastric antrum. Biopsies were taken with a cold       forceps for Helicobacter pylori testing.      The duodenal bulb, first portion of the duodenum and  second portion of       the duodenum were normal.      Mucosal changes characterized by an isolated varix? Possible nodule were       found in the middle third of the esophagus at approx 30cm (See pictures). Impression:               - Z-line regular, 39 cm from the incisors.                           - Gastritis. Biopsied.                           - Normal duodenal bulb, first portion of the                            duodenum and second portion of the duodenum. Moderate Sedation:      Per Anesthesia Care Recommendation:           - Patient has a contact number available for                            emergencies. The signs and symptoms of potential                            delayed complications were discussed with the                            patient. Return to normal activities tomorrow.                            Written discharge instructions were provided to the                            patient.                           - Resume previous diet.                           - Continue present medications.                           - Await pathology results.                           - Use a proton pump inhibitor PO daily.                           - Consider Liver US with elastography Procedure Code(s):        --- Professional ---  [id number], Esophagogastroduodenoscopy, flexible,                            transoral; with biopsy, single or multiple Diagnosis Code(s):        --- Professional ---                           K29.70, Gastritis, unspecified, without bleeding                           D50.9, Iron deficiency anemia, unspecified CPT copyright 2022 American Medical Association. All rights reserved. The codes documented in this report are preliminary and upon coder review may  be revised to meet current compliance requirements. Elon Alas. Abbey Chatters, DO [place] Abbey Chatters, DO 11/18/2022 12:40:12 PM This report has been signed electronically. Number of  Addenda: 0

## 2022-11-18 NOTE — Anesthesia Preprocedure Evaluation (Signed)
Anesthesia Evaluation  Patient identified by MRN, date of birth, ID band Patient awake    Reviewed: Allergy & Precautions, H&P , NPO status , Patient's Chart, lab work & pertinent test results, reviewed documented beta blocker date and time   Airway Mallampati: II  TM Distance: >3 FB Neck ROM: full    Dental no notable dental hx.    Pulmonary neg pulmonary ROS, former smoker   Pulmonary exam normal breath sounds clear to auscultation       Cardiovascular Exercise Tolerance: Good hypertension, + CAD and + Past MI  negative cardio ROS  Rhythm:regular Rate:Normal     Neuro/Psych  Neuromuscular disease negative neurological ROS  negative psych ROS   GI/Hepatic negative GI ROS, Neg liver ROS,GERD  ,,  Endo/Other  negative endocrine ROSdiabetesHypothyroidism    Renal/GU Renal diseasenegative Renal ROS  negative genitourinary   Musculoskeletal   Abdominal   Peds  Hematology negative hematology ROS (+) Blood dyscrasia, anemia   Anesthesia Other Findings   Reproductive/Obstetrics negative OB ROS                             Anesthesia Physical Anesthesia Plan  ASA: 3  Anesthesia Plan: General   Post-op Pain Management:    Induction:   PONV Risk Score and Plan: Propofol infusion  Airway Management Planned:   Additional Equipment:   Intra-op Plan:   Post-operative Plan:   Informed Consent: I have reviewed the patients History and Physical, chart, labs and discussed the procedure including the risks, benefits and alternatives for the proposed anesthesia with the patient or authorized representative who has indicated his/her understanding and acceptance.     Dental Advisory Given  Plan Discussed with: CRNA  Anesthesia Plan Comments:        Anesthesia Quick Evaluation

## 2022-11-18 NOTE — Discharge Instructions (Addendum)
EGD Discharge instructions Please read the instructions outlined below and refer to this sheet in the next few weeks. These discharge instructions provide you with general information on caring for yourself after you leave the hospital. Your doctor may also give you specific instructions. While your treatment has been planned according to the most current medical practices available, unavoidable complications occasionally occur. If you have any problems or questions after discharge, please call your doctor. ACTIVITY You may resume your regular activity but move at a slower pace for the next 24 hours.  Take frequent rest periods for the next 24 hours.  Walking will help expel (get rid of) the air and reduce the bloated feeling in your abdomen.  No driving for 24 hours (because of the anesthesia (medicine) used during the test).  You may shower.  Do not sign any important legal documents or operate any machinery for 24 hours (because of the anesthesia used during the test).  NUTRITION Drink plenty of fluids.  You may resume your normal diet.  Begin with a light meal and progress to your normal diet.  Avoid alcoholic beverages for 24 hours or as instructed by your caregiver.  MEDICATIONS You may resume your normal medications unless your caregiver tells you otherwise.  WHAT YOU CAN EXPECT TODAY You may experience abdominal discomfort such as a feeling of fullness or "gas" pains.  FOLLOW-UP Your doctor will discuss the results of your test with you.  SEEK IMMEDIATE MEDICAL ATTENTION IF ANY OF THE FOLLOWING OCCUR: Excessive nausea (feeling sick to your stomach) and/or vomiting.  Severe abdominal pain and distention (swelling).  Trouble swallowing.  Temperature over 101 F (37.8 C).  Rectal bleeding or vomiting of blood.     Colonoscopy Discharge Instructions  Read the instructions outlined below and refer to this sheet in the next few weeks. These discharge instructions provide you with  general information on caring for yourself after you leave the hospital. Your doctor may also give you specific instructions. While your treatment has been planned according to the most current medical practices available, unavoidable complications occasionally occur.   ACTIVITY You may resume your regular activity, but move at a slower pace for the next 24 hours.  Take frequent rest periods for the next 24 hours.  Walking will help get rid of the air and reduce the bloated feeling in your belly (abdomen).  No driving for 24 hours (because of the medicine (anesthesia) used during the test).   Do not sign any important legal documents or operate any machinery for 24 hours (because of the anesthesia used during the test).  NUTRITION Drink plenty of fluids.  You may resume your normal diet as instructed by your doctor.  Begin with a light meal and progress to your normal diet. Heavy or fried foods are harder to digest and may make you feel sick to your stomach (nauseated).  Avoid alcoholic beverages for 24 hours or as instructed.  MEDICATIONS You may resume your normal medications unless your doctor tells you otherwise.  WHAT YOU CAN EXPECT TODAY Some feelings of bloating in the abdomen.  Passage of more gas than usual.  Spotting of blood in your stool or on the toilet paper.  IF YOU HAD POLYPS REMOVED DURING THE COLONOSCOPY: No aspirin products for 7 days or as instructed.  No alcohol for 7 days or as instructed.  Eat a soft diet for the next 24 hours.  FINDING OUT THE RESULTS OF YOUR TEST Not all test results are  available during your visit. If your test results are not back during the visit, make an appointment with your caregiver to find out the results. Do not assume everything is normal if you have not heard from your caregiver or the medical facility. It is important for you to follow up on all of your test results.  SEEK IMMEDIATE MEDICAL ATTENTION IF: You have more than a spotting of  blood in your stool.  Your belly is swollen (abdominal distention).  You are nauseated or vomiting.  You have a temperature over 101.  You have abdominal pain or discomfort that is severe or gets worse throughout the day.   Your EGD revealed mild amount inflammation in your stomach.  I took biopsies of this to rule out infection with a bacteria called H. pylori.  Await pathology results, my office will contact you.  There was what appeared to be a blood vessel in the middle of your esophagus.  This may be a sign of chronic liver disease.  We can consider ultrasound of your liver, though CT scan less than a year ago showed your liver looked normal  Small bowel appeared normal.  Continue on pantoprazole daily.   Your colonoscopy revealed 5 polyp(s) which I removed successfully. Await pathology results, my office will contact you.  Given your age, I think it is reasonable to not pursue surveillance colonoscopy in the future.  You also have diverticulosis and internal hemorrhoids. I would recommend increasing fiber in your diet or adding OTC Benefiber/Metamucil. Be sure to drink at least 4 to 6 glasses of water daily.   I took biopsies throughout your entire colon to rule out a condition called microscopic colitis which can cause chronically loose stools.  I did not see any active inflammation throughout your entire colon.  Follow-up with GI in 2 to 3 months.      I hope you have a great rest of your week!  Elon Alas. Abbey Chatters, D.O. Gastroenterology and Hepatology [facility]

## 2022-11-19 LAB — SURGICAL PATHOLOGY

## 2022-11-19 NOTE — Anesthesia Postprocedure Evaluation (Signed)
Anesthesia Post Note  Patient: Ryan Herrera  Procedure(s) Performed: COLONOSCOPY WITH PROPOFOL ESOPHAGOGASTRODUODENOSCOPY (EGD) WITH PROPOFOL BIOPSY POLYPECTOMY  Patient location during evaluation: Phase II Anesthesia Type: General Level of consciousness: awake Pain management: pain level controlled Vital Signs Assessment: post-procedure vital signs reviewed and stable Respiratory status: spontaneous breathing and respiratory function stable Cardiovascular status: blood pressure returned to baseline and stable Postop Assessment: no headache and no apparent nausea or vomiting Anesthetic complications: no Comments: Late entry   No notable events documented.   Last Vitals:  Vitals:   11/18/22 1204 11/18/22 1302  BP: 123/74   Pulse: 66 66  Resp: 16 15  Temp: 36.7 C 36.6 C  SpO2: 94% 95%    Last Pain:  Vitals:   11/18/22 1302  TempSrc: Oral  PainSc: 0-No pain                 Louann Sjogren

## 2022-11-24 ENCOUNTER — Other Ambulatory Visit: Payer: Self-pay | Admitting: Internal Medicine

## 2022-11-25 ENCOUNTER — Encounter (HOSPITAL_COMMUNITY): Payer: Self-pay | Admitting: Internal Medicine

## 2022-11-27 ENCOUNTER — Telehealth: Payer: Self-pay

## 2022-11-27 NOTE — Telephone Encounter (Signed)
Left a message requesting pt to return call to the office.

## 2022-11-28 ENCOUNTER — Encounter: Payer: Self-pay | Admitting: Family Medicine

## 2022-11-28 ENCOUNTER — Other Ambulatory Visit: Payer: Self-pay

## 2022-11-28 MED ORDER — TRESIBA FLEXTOUCH 100 UNIT/ML ~~LOC~~ SOPN: 30.0000 [IU] | PEN_INJECTOR | Freq: Every day | SUBCUTANEOUS | 0 refills | Status: DC

## 2022-12-02 ENCOUNTER — Other Ambulatory Visit: Payer: Self-pay | Admitting: Family Medicine

## 2022-12-02 NOTE — Telephone Encounter (Signed)
Urgent Care patient Requested Prescriptions  Pending Prescriptions Disp Refills   fluticasone (FLONASE) 50 MCG/ACT nasal spray [Pharmacy Med Name: FLUTICASONE PROP 50 MCG SPRAY] 48 mL     Sig: PLACE 1 SPRAY INTO BOTH NOSTRILS 2 (TWO) TIMES DAILY     There is no refill protocol information for this order

## 2022-12-03 ENCOUNTER — Telehealth: Payer: Self-pay | Admitting: Family Medicine

## 2022-12-03 NOTE — Telephone Encounter (Signed)
Patient called upset he received a no show letter.  He said he was by his phone and no one ever called.  He ask for the no show to be removed and I did.  I told him I can not remove the letter but I have removed the no show and he has 0 no shows listed now.

## 2022-12-05 ENCOUNTER — Ambulatory Visit (INDEPENDENT_AMBULATORY_CARE_PROVIDER_SITE_OTHER): Payer: Medicare HMO

## 2022-12-05 DIAGNOSIS — Z0000 Encounter for general adult medical examination without abnormal findings: Secondary | ICD-10-CM | POA: Diagnosis not present

## 2022-12-05 NOTE — Patient Instructions (Signed)
  Mr. Cressler , Thank you for taking time to come for your Medicare Wellness Visit. I appreciate your ongoing commitment to your health goals. Please review the following plan we discussed and let me know if I can assist you in the future.   These are the goals we discussed:  Goals      Patient Stated     Elizebeth Koller writing books         This is a list of the screening recommended for you and due dates:  Health Maintenance  Topic Date Due   COVID-19 Vaccine (6 - 2023-24 season) 06/07/2022   Eye exam for diabetics  10/16/2022   Hemoglobin A1C  04/03/2023   Complete foot exam   09/14/2023   Yearly kidney function blood test for diabetes  10/03/2023   Yearly kidney health urinalysis for diabetes  10/03/2023   Medicare Annual Wellness Visit  12/05/2023   DTaP/Tdap/Td vaccine (4 - Td or Tdap) 03/09/2032   Pneumonia Vaccine  Completed   Flu Shot  Completed   Hepatitis C Screening: USPSTF Recommendation to screen - Ages 30-79 yo.  Completed   Zoster (Shingles) Vaccine  Completed   HPV Vaccine  Aged Out   Colon Cancer Screening  Discontinued

## 2022-12-05 NOTE — Progress Notes (Signed)
Subjective:   Ryan Herrera is a 77 y.o. male who presents for Medicare Annual/Subsequent preventive examination.  Review of Systems    I connected with  Suzan Garibaldi on 12/05/22 by a audio enabled telemedicine application and verified that I am speaking with the correct person using two identifiers.  Patient Location: Home  Provider Location: Office/Clinic  I discussed the limitations of evaluation and management by telemedicine. The patient expressed understanding and agreed to proceed.        Objective:    There were no vitals filed for this visit. There is no height or weight on file to calculate BMI.     11/18/2022   11:54 AM 06/11/2022    5:04 PM 05/29/2022    1:10 PM 03/22/2022    3:01 PM 03/09/2022    2:16 PM 11/08/2021    1:42 PM 05/12/2020    6:26 PM  Advanced Directives  Does Patient Have a Medical Advance Directive? No No No No No No No  Would patient like information on creating a medical advance directive? No - Patient declined  No - Patient declined No - Patient declined No - Patient declined No - Patient declined     Current Medications (verified) Outpatient Encounter Medications as of 12/05/2022  Medication Sig   acetaminophen (TYLENOL) 650 MG CR tablet Take 1,300 mg by mouth every 8 (eight) hours as needed for pain.   allopurinol (ZYLOPRIM) 100 MG tablet Take 1 tablet (100 mg total) by mouth daily.   aspirin EC 81 MG tablet Take 81 mg by mouth daily.   Carboxymethylcellulose Sodium (THERATEARS PF OP) Place 1 drop into both eyes daily as needed (dry eyes).   carvedilol (COREG) 6.25 MG tablet Take 1 tablet (6.25 mg total) by mouth 2 (two) times daily.   Cholecalciferol (VITAMIN D) 50 MCG (2000 UT) tablet Take 2,000 Units by mouth daily.   clotrimazole-betamethasone (LOTRISONE) cream Apply 1 Application topically 2 (two) times daily as needed (rash).   cyanocobalamin (VITAMIN B12) 1000 MCG tablet Take 1,000 mcg by mouth daily.   Evolocumab (REPATHA  SURECLICK) [id number] MG/ML SOAJ INJECT 140 MG INTO THE SKIN EVERY 14 (FOURTEEN) DAYS.   ezetimibe (ZETIA) 10 MG tablet TAKE 1 TABLET BY MOUTH EVERY DAY   glipiZIDE (GLUCOTROL XL) 5 MG 24 hr tablet Take 5 mg by mouth daily with breakfast.   glucose blood test strip 1 each by Other route as needed. Use to check blood glucose twice daily as instructed   hydrochlorothiazide (HYDRODIURIL) 25 MG tablet TAKE 1 TABLET (25 MG TOTAL) BY MOUTH DAILY.   insulin degludec (TRESIBA FLEXTOUCH) 100 UNIT/ML FlexTouch Pen Inject 30 Units into the skin at bedtime.   Insulin Pen Needle (BD PEN NEEDLE NANO 2ND GEN) 32G X 4 MM MISC Use as directed to inject insulin daily   ketoconazole (NIZORAL) 2 % cream Apply 1 Application topically daily.   ketoconazole (NIZORAL) 2 % shampoo Apply 1 Application topically See admin instructions. Uses a small amount with every shower   levothyroxine (SYNTHROID) 100 MCG tablet TAKE 1 TABLET BY MOUTH EVERY DAY BEFORE BREAKFAST   metFORMIN (GLUCOPHAGE) 1000 MG tablet Take 1 tablet (1,000 mg total) by mouth 2 (two) times daily with a meal.   pantoprazole (PROTONIX) 40 MG tablet Take 1 tablet (40 mg total) by mouth daily.   tadalafil (CIALIS) 5 MG tablet Take 5 mg by mouth daily.   triamcinolone cream (KENALOG) 0.5 % Apply 1 Application topically 2 (two) times daily  as needed (tick bites).   UNABLE TO FIND Lift chair DX: M47.812   UNABLE TO FIND Blood pressure cuff DX: I10   valACYclovir (VALTREX) 500 MG tablet TAKE 1 TABLET BY MOUTH TWICE A DAY   No facility-administered encounter medications on file as of 12/05/2022.    Allergies (verified) Myrbetriq [mirabegron], Jardiance [empagliflozin], Niacin and related, Nsaids, Other, and Statins   History: Past Medical History:  Diagnosis Date   Arthritis    hands   Coronary artery disease    Diabetes mellitus without complication ([place])    GERD (gastroesophageal reflux disease)    H/O poliomyelitis    age 68.  now has "weak stomach muscles"    Hypertension    IBS (irritable bowel syndrome)    Iron deficiency anemia 09/22/2018   Myocardial infarct [facility])    Renal insufficiency    Thyroid disease    Past Surgical History:  Procedure Laterality Date   [place] SURGERY  10/04/2005   benign tumor removal  2004   BIOPSY  11/18/2022   Procedure: BIOPSY;  Surgeon: Eloise Harman, DO;  [facility];  Service: Endoscopy;;   COLONOSCOPY WITH PROPOFOL N/A 11/18/2022   Procedure: COLONOSCOPY WITH PROPOFOL;  Surgeon: Eloise Harman, DO;  [facility];  Service: Endoscopy;  Laterality: N/A;  8:30 am, pt did not want early appt. moved down (per PAT)   CORONARY ARTERY BYPASS GRAFT  2008   [facility] - 3 vessel   CORONARY ARTERY BYPASS GRAFT N/A 03/28/2022   Procedure: REDO CORONARY ARTERY BYPASS GRAFTING (CABG) X2 BYPASSES USING OPEN LEFT RADIAL ARTERY AND ENDOSCOPIC RIGHT GREATER SAPHENOUS VEIN HARVEST;  Surgeon: Melrose Nakayama, MD;  Location: [place];  Service: Open Heart Surgery;  Laterality: N/A;   CYSTOSCOPY WITH INSERTION OF UROLIFT     ESOPHAGOGASTRODUODENOSCOPY (EGD) WITH PROPOFOL N/A 11/18/2022   Procedure: ESOPHAGOGASTRODUODENOSCOPY (EGD) WITH PROPOFOL;  Surgeon: Eloise Harman, DO;  [facility];  Service: Endoscopy;  Laterality: N/A;   HERNIA REPAIR Bilateral    inguinal   HYDROCELE EXCISION / REPAIR     JOINT REPLACEMENT     left knee x2, rright x1   LEFT HEART CATH AND CORS/GRAFTS ANGIOGRAPHY N/A 03/25/2022   Procedure: LEFT HEART CATH AND CORS/GRAFTS ANGIOGRAPHY;  Surgeon: Jettie Booze, MD;  Location: [place] [facility];  Service: Cardiovascular;  Laterality: N/A;   POLYPECTOMY  11/18/2022   Procedure: POLYPECTOMY;  Surgeon: Eloise Harman, DO;  [facility];  Service: Endoscopy;;   PROSTATE SURGERY N/A 01/2021   TEE WITHOUT CARDIOVERSION N/A 03/28/2022   Procedure: TRANSESOPHAGEAL ECHOCARDIOGRAM (TEE);  Surgeon: Melrose Nakayama, MD;   Location: [place];  Service: Open Heart Surgery;  Laterality: N/A;   TONSILLECTOMY     Family History  Problem Relation Age of Onset   Hypertension Mother    Heart attack Mother    Osteoarthritis Mother    Stroke Father    Hypertension Father    Stroke Sister    Stroke Sister    Colon cancer Neg Hx    Prostate cancer Neg Hx    Cancer - Lung Neg Hx    Social History   Socioeconomic History   Marital status: Divorced    Spouse name: Not on file   Number of children: 1   Years of education: 12   Highest education level: Master's degree (e.g., MA, MS, MEng, MEd, MSW, MBA)  Occupational History  Occupation: retired  Tobacco Use   Smoking status: Former    Packs/day: 1.00    Years: 38.00    Total pack years: 38.00    Types: Cigarettes    Quit date: 12/1996    Years since quitting: 26.0   Smokeless tobacco: Never  Vaping Use   Vaping Use: Never used  Substance and Sexual Activity   Alcohol use: Yes    Comment: occassionally   Drug use: No   Sexual activity: Not Currently  Other Topics Concern   Not on file  Social History Narrative   Lives with his significant other, pt is retired    Scientist, 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[place]  (11/08/2021)   Overall Financial Resource Strain (CARDIA)    Difficulty of Paying Living Expenses: Not hard at [place]: No [place] (11/08/2021)   Hunger Vital Sign    Worried About [place] in the Last Year: Never true    [place] in the Last Year: Never true  Transportation Needs: No Transportation Needs (11/08/2021)   PRAPARE - Hydrologist (Medical): No    Lack of Transportation (Non-Medical): No  Physical Activity: Inactive (11/08/2021)   Exercise Vital Sign    Days of Exercise per Week: 0 days    Minutes of Exercise per Session: 0 min  Stress: No Stress Concern Present (11/08/2021)   [place]    Feeling of Stress : Not at all  Social Connections: Socially Isolated (11/08/2021)   Social Connection and Isolation Panel [NHANES]    Frequency of Communication with Friends and Family: More than three times a week    Frequency of Social Gatherings with Friends and Family: Once a week    Attends Religious Services: Never    Marine scientist or Organizations: No    Attends Archivist Meetings: Never    Marital Status: Divorced    Tobacco Counseling Counseling given: Not Answered   Clinical Intake:   Diabetic?YES Nutrition Risk Assessment:  Has the patient had any N/V/D within the last 2 months?  Yes sees GI  Does the patient have any non-healing wounds?  No  Has the patient had any unintentional weight loss or weight gain?  No   Diabetes:  Is the patient diabetic?  Yes  If diabetic, was a CBG obtained today?  No  Did the patient bring in their glucometer from home?  No  How often do you monitor your CBG's? DAILY.   Financial Strains and Diabetes Management:  Are you having any financial strains with the device, your supplies or your medication? No .  Does the patient want to be seen by Chronic Care Management for management of their diabetes?  No  Would the patient like to be referred to a Nutritionist or for Diabetic Management?  No   Diabetic Exams:  Diabetic Eye Exam: Overdue for diabetic eye exam. Pt has been advised about the importance in completing this exam. Patient advised to call and schedule an eye exam. Diabetic Foot Exam: Completed 09/13/22           Activities of Daily Living    03/27/2022    1:00 PM  In your present state of health, do you have any difficulty performing the following activities:  Hearing? 0  Vision? 0  Difficulty concentrating or making decisions? 0  Walking or climbing  stairs? 0  Dressing or bathing? 0  Doing errands, shopping? 0    Patient Care Team: Alvira Monday, FNP as PCP - General  (Family Medicine) Janina Mayo, MD as PCP - Cardiology (Cardiology)  Indicate any recent Medical Services you may have received from other than [facility] providers in the past year (date may be approximate).     Assessment:   This is a routine wellness examination for Joshuadavid.  Hearing/Vision screen No results found.  Dietary issues and exercise activities discussed:     Goals Addressed   None   Depression Screen    10/08/2022   10:19 AM 09/13/2022    2:52 PM 08/26/2022    1:04 PM 06/25/2022    2:32 PM 06/14/2022    1:54 PM 05/29/2022    1:10 PM 05/02/2022   10:57 AM  PHQ 2/9 Scores  PHQ - 2 Score 0 0 0 0 0 1 0  PHQ- 9 Score  '1 2   3     '$ Fall Risk    10/08/2022   10:19 AM 09/13/2022    2:52 PM 07/17/2022    2:36 PM 06/25/2022    2:32 PM 06/14/2022    1:54 PM  Fall Risk   Falls in the past year? 0 0 1 0 1  Number falls in past yr: 0 0 0 0 0  Injury with Fall? 0 0 1 0 1  Risk for fall due to : No Fall Risks No Fall Risks  No Fall Risks Other (Comment)  Follow up Falls evaluation completed Falls evaluation completed  Falls evaluation completed Falls evaluation completed    [place]:  Any stairs in or around the home? Yes  If so, are there any without handrails? No  Home free of loose throw rugs in walkways, pet beds, electrical cords, etc? Yes  Adequate lighting in your home to reduce risk of falls? Yes   ASSISTIVE DEVICES UTILIZED TO PREVENT FALLS:  Life alert? No  Use of a cane, walker or w/c? No  Grab bars in the bathroom? No  Shower chair or bench in shower? No  Elevated toilet seat or a handicapped toilet? Yes           11/08/2021    1:46 PM  6CIT Screen  What Year? 0 points  What month? 0 points  What time? 0 points  Count back from 20 0 points  Months in reverse 0 points  Repeat phrase 0 points  Total Score 0 points    Immunizations Immunization History  Administered Date(s) Administered   Fluad Quad(high Dose 65+)  06/14/2022   Influenza Split 07/19/2013   Influenza, High Dose Seasonal PF 06/13/2015, 07/18/2016, 06/11/2017, 07/08/2018, 07/02/2019, 09/15/2020, 07/27/2021   Influenza,inj,Quad PF,6+ Mos 08/08/2014   Influenza-Unspecified 07/15/2012, 07/15/2013, 08/08/2014, 06/13/2015, 07/18/2016   PFIZER Comirnaty(Gray Top)Covid-19 Tri-Sucrose Vaccine 11/27/2019, 12/19/2019, 07/26/2020, 04/20/2021   PPD Test 01/22/2013, 01/22/2013   Pfizer Covid-19 Vaccine Bivalent Booster 25yr & up 07/27/2021   Pneumococcal Conjugate-13 05/04/2014   Pneumococcal Polysaccharide-23 07/15/2012   Td 02/15/2003   Tdap 05/04/2014, 03/09/2022   Zoster Recombinat (Shingrix) 02/12/2017, 02/13/2017, 06/04/2017    TDAP status: Up to date  Flu Vaccine status: Up to date  Pneumococcal vaccine status: Up to date  Covid-19 vaccine status: Completed vaccines  Qualifies for Shingles Vaccine? Yes   Zostavax completed Yes   Shingrix Completed?: Yes  Screening Tests Health Maintenance  Topic Date Due   COVID-19 Vaccine (6 -  2023-24 season) 06/07/2022   OPHTHALMOLOGY EXAM  10/16/2022   HEMOGLOBIN A1C  04/03/2023   FOOT EXAM  09/14/2023   Diabetic kidney evaluation - eGFR measurement  10/03/2023   Diabetic kidney evaluation - Urine ACR  10/03/2023   Medicare Annual Wellness (AWV)  12/05/2023   DTaP/Tdap/Td (4 - Td or Tdap) 03/09/2032   Pneumonia Vaccine 42+ Years old  Completed   INFLUENZA VACCINE  Completed   Hepatitis C Screening  Completed   Zoster Vaccines- Shingrix  Completed   HPV VACCINES  Aged Out   COLONOSCOPY (Pts 45-65yr Insurance coverage will need to be confirmed)  Discontinued    Health Maintenance  Health Maintenance Due  Topic Date Due   COVID-19 Vaccine (6 - 2023-24 season) 06/07/2022   OPHTHALMOLOGY EXAM  10/16/2022    Colorectal cancer screening: No longer required.   Lung Cancer Screening: (Low Dose CT Chest recommended if Age 463-80years, 30 pack-year currently smoking OR have quit w/in  15years.) does qualify.   Lung Cancer Screening Referral: YES  Additional Screening:  Hepatitis C Screening: does qualify; Completed 10/02/22  Vision Screening: Recommended annual ophthalmology exams for early detection of glaucoma and other disorders of the eye. Is the patient up to date with their annual eye exam?  Yes  Who is the provider or what is the name of the office in which the patient [facility]ends annual [facility] exams[facility] [place] If pt is not established with a provider, would they like to be referred to a provider to establish care? No .   Dental Screening: Recommended annual dental exams for proper oral hygiene  Community Resource Referral / Chronic Care Management: CRR required this visit?  No   CCM required this visit?  No      Plan:     I have personally reviewed and noted the following in the patient's chart:   Medical and social history Use of alcohol, tobacco or illicit drugs  Current medications and supplements including opioid prescriptions. Patient is not currently taking opioid prescriptions. Functional ability and status Nutritional status Physical activity Advanced directives List of other physicians Hospitalizations, surgeries, and ER visits in previous 12 months Vitals Screenings to include cognitive, depression, and falls Referrals and appointments  In addition, I have reviewed and discussed with patient certain preventive protocols, quality metrics, and best practice recommendations. A written personalized care plan for preventive services as well as general preventive health recommendations were provided to patient.     KQuentin Angst [place]  12/05/2022

## 2022-12-09 ENCOUNTER — Other Ambulatory Visit: Payer: Self-pay | Admitting: "Endocrinology

## 2022-12-13 ENCOUNTER — Encounter: Payer: Self-pay | Admitting: Family Medicine

## 2022-12-13 ENCOUNTER — Ambulatory Visit (INDEPENDENT_AMBULATORY_CARE_PROVIDER_SITE_OTHER): Payer: Medicare HMO | Admitting: Family Medicine

## 2022-12-13 VITALS — BP 135/76 | HR 66 | Wt 195.0 lb

## 2022-12-13 DIAGNOSIS — K529 Noninfective gastroenteritis and colitis, unspecified: Secondary | ICD-10-CM

## 2022-12-13 DIAGNOSIS — E038 Other specified hypothyroidism: Secondary | ICD-10-CM | POA: Diagnosis not present

## 2022-12-13 DIAGNOSIS — B356 Tinea cruris: Secondary | ICD-10-CM

## 2022-12-13 DIAGNOSIS — E119 Type 2 diabetes mellitus without complications: Secondary | ICD-10-CM

## 2022-12-13 DIAGNOSIS — E559 Vitamin D deficiency, unspecified: Secondary | ICD-10-CM

## 2022-12-13 DIAGNOSIS — E782 Mixed hyperlipidemia: Secondary | ICD-10-CM

## 2022-12-13 MED ORDER — FLUCONAZOLE 150 MG PO TABS: 150.0000 mg | ORAL_TABLET | Freq: Once | ORAL | 0 refills | Status: DC | PRN

## 2022-12-13 NOTE — Progress Notes (Signed)
Established Patient Office Visit  Subjective:  Patient ID: Ryan Herrera, male    DOB: April 21, 1946  Age: 77 y.o. MRN: [id number]  CC:  Chief Complaint  Patient presents with   Follow-up    3 month f/u.     HPI Ryan Herrera is a 77 y.o. male with past medical history of type 2 diabetes, hypothyroidism, tinea of groin presents for f/u of  chronic medical conditions. For the details of today's visit, please refer to the assessment and plan.     Past Medical History:  Diagnosis Date   Arthritis    hands   Coronary artery disease    Diabetes mellitus without complication ([place])    GERD (gastroesophageal reflux disease)    H/O poliomyelitis    age 68.  now has "weak stomach muscles"   Hypertension    IBS (irritable bowel syndrome)    Iron deficiency anemia 09/22/2018   Myocardial infarct [facility])    Renal insufficiency    Thyroid disease     Past Surgical History:  Procedure Laterality Date   [place] SURGERY  10/04/2005   benign tumor removal  2004   BIOPSY  11/18/2022   Procedure: BIOPSY;  Surgeon: Eloise Harman, DO;  [facility];  Service: Endoscopy;;   COLONOSCOPY WITH PROPOFOL N/A 11/18/2022   Procedure: COLONOSCOPY WITH PROPOFOL;  Surgeon: Eloise Harman, DO;  [facility];  Service: Endoscopy;  Laterality: N/A;  8:30 am, pt did not want early appt. moved down (per PAT)   CORONARY ARTERY BYPASS GRAFT  2008   [facility] - 3 vessel   CORONARY ARTERY BYPASS GRAFT N/A 03/28/2022   Procedure: REDO CORONARY ARTERY BYPASS GRAFTING (CABG) X2 BYPASSES USING OPEN LEFT RADIAL ARTERY AND ENDOSCOPIC RIGHT GREATER SAPHENOUS VEIN HARVEST;  Surgeon: Melrose Nakayama, MD;  Location: [place];  Service: Open Heart Surgery;  Laterality: N/A;   CYSTOSCOPY WITH INSERTION OF UROLIFT     ESOPHAGOGASTRODUODENOSCOPY (EGD) WITH PROPOFOL N/A 11/18/2022   Procedure: ESOPHAGOGASTRODUODENOSCOPY (EGD) WITH PROPOFOL;  Surgeon: Eloise Harman, DO;   [facility];  Service: Endoscopy;  Laterality: N/A;   HERNIA REPAIR Bilateral    inguinal   HYDROCELE EXCISION / REPAIR     JOINT REPLACEMENT     left knee x2, rright x1   LEFT HEART CATH AND CORS/GRAFTS ANGIOGRAPHY N/A 03/25/2022   Procedure: LEFT HEART CATH AND CORS/GRAFTS ANGIOGRAPHY;  Surgeon: Jettie Booze, MD;  Location: [place] [facility];  Service: Cardiovascular;  Laterality: N/A;   POLYPECTOMY  11/18/2022   Procedure: POLYPECTOMY;  Surgeon: Eloise Harman, DO;  [facility];  Service: Endoscopy;;   PROSTATE SURGERY N/A 01/2021   TEE WITHOUT CARDIOVERSION N/A 03/28/2022   Procedure: TRANSESOPHAGEAL ECHOCARDIOGRAM (TEE);  Surgeon: Melrose Nakayama, MD;  Location: [place];  Service: Open Heart Surgery;  Laterality: N/A;   TONSILLECTOMY      Family History  Problem Relation Age of Onset   Hypertension Mother    Heart attack Mother    Osteoarthritis Mother    Stroke Father    Hypertension Father    Stroke Sister    Stroke Sister    Colon cancer Neg Hx    Prostate cancer Neg Hx    Cancer - Lung Neg Hx     Social History   Socioeconomic History   Marital status: Divorced    Spouse name: Not on file  Number of children: 1   Years of education: 12   Highest education level: Master's degree (e.g., MA, MS, MEng, MEd, MSW, MBA)  Occupational History   Occupation: retired  Tobacco Use   Smoking status: Former    Packs/day: 1.00    Years: 38.00    Total pack years: 38.00    Types: Cigarettes    Quit date: 12/1996    Years since quitting: 26.0   Smokeless tobacco: Never  Vaping Use   Vaping Use: Never used  Substance and Sexual Activity   Alcohol use: Yes    Comment: occassionally   Drug use: No   Sexual activity: Not Currently  Other Topics Concern   Not on file  Social History Narrative   Lives with his significant other, pt is retired    Scientist, 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[place]  (12/05/2022)    Overall Financial Resource Strain (CARDIA)    Difficulty of Paying Living Expenses: Not hard at all  Food Insecurity: No Food Insecurity (12/05/2022)   Hunger Vital Sign    Worried About Running Out of Food in the Last Year: Never true    [place] in the Last Year: Never true  Transportation Needs: No Transportation Needs (12/05/2022)   PRAPARE - Hydrologist (Medical): No    Lack of Transportation (Non-Medical): No  Physical Activity: Inactive (12/05/2022)   Exercise Vital Sign    Days of Exercise per Week: 0 days    Minutes of Exercise per Session: 0 min  Stress: No Stress Concern Present (12/05/2022)   [place]    Feeling of Stress : Only a little  Social Connections: Socially Isolated (12/05/2022)   Social Connection and Isolation Panel [NHANES]    Frequency of Communication with Friends and Family: More than three times a week    Frequency of Social Gatherings with Friends and Family: Once a week    Attends Religious Services: Never    Marine scientist or Organizations: No    Attends Archivist Meetings: Never    Marital Status: Divorced  Human resources officer Violence: Not At Risk (12/05/2022)   Humiliation, Afraid, Rape, and Kick questionnaire    Fear of Current or Ex-Partner: No    Emotionally Abused: No    Physically Abused: No    Sexually Abused: No    Outpatient Medications Prior to Visit  Medication Sig Dispense Refill   acetaminophen (TYLENOL) 650 MG CR tablet Take 1,300 mg by mouth every 8 (eight) hours as needed for pain.     allopurinol (ZYLOPRIM) 100 MG tablet Take 1 tablet (100 mg total) by mouth daily. 30 tablet 3   aspirin EC 81 MG tablet Take 81 mg by mouth daily.     Carboxymethylcellulose Sodium (THERATEARS PF OP) Place 1 drop into both eyes daily as needed (dry eyes).     carvedilol (COREG) 6.25 MG tablet Take 1 tablet (6.25 mg total) by mouth 2  (two) times daily. 180 tablet 3   Cholecalciferol (VITAMIN D) 50 MCG (2000 UT) tablet Take 2,000 Units by mouth daily.     clotrimazole-betamethasone (LOTRISONE) cream Apply 1 Application topically 2 (two) times daily as needed (rash).     cyanocobalamin (VITAMIN B12) 1000 MCG tablet Take 1,000 mcg by mouth daily.     Evolocumab (REPATHA SURECLICK) [id number] MG/ML SOAJ INJECT 140 MG INTO THE SKIN EVERY 14 (FOURTEEN)  DAYS. 6 mL 1   ezetimibe (ZETIA) 10 MG tablet TAKE 1 TABLET BY MOUTH EVERY DAY 90 tablet 3   glipiZIDE (GLUCOTROL XL) 5 MG 24 hr tablet TAKE 1 TABLET BY MOUTH EVERY DAY WITH BREAKFAST 90 tablet 0   glucose blood test strip 1 each by Other route as needed. Use to check blood glucose twice daily as instructed 100 each 2   hydrochlorothiazide (HYDRODIURIL) 25 MG tablet TAKE 1 TABLET (25 MG TOTAL) BY MOUTH DAILY. 90 tablet 1   insulin degludec (TRESIBA FLEXTOUCH) 100 UNIT/ML FlexTouch Pen Inject 30 Units into the skin at bedtime. 30 mL 0   Insulin Pen Needle (BD PEN NEEDLE NANO 2ND GEN) 32G X 4 MM MISC Use as directed to inject insulin daily 100 each 2   ketoconazole (NIZORAL) 2 % cream Apply 1 Application topically daily. 15 g 3   ketoconazole (NIZORAL) 2 % shampoo Apply 1 Application topically See admin instructions. Uses a small amount with every shower     levothyroxine (SYNTHROID) 100 MCG tablet TAKE 1 TABLET BY MOUTH EVERY DAY BEFORE BREAKFAST 90 tablet 0   metFORMIN (GLUCOPHAGE) 1000 MG tablet Take 1 tablet (1,000 mg total) by mouth 2 (two) times daily with a meal. 60 tablet 2   pantoprazole (PROTONIX) 40 MG tablet Take 1 tablet (40 mg total) by mouth daily. 90 tablet 3   tadalafil (CIALIS) 5 MG tablet Take 5 mg by mouth daily.     triamcinolone cream (KENALOG) 0.5 % Apply 1 Application topically 2 (two) times daily as needed (tick bites).     UNABLE TO FIND Lift chair DX: M47.812 1 each 0   UNABLE TO FIND Blood pressure cuff DX: I10 1 each 0   valACYclovir (VALTREX) 500 MG tablet TAKE 1  TABLET BY MOUTH TWICE A DAY 180 tablet 1   No facility-administered medications prior to visit.    Allergies  Allergen Reactions   Myrbetriq [Mirabegron] Nausea And Vomiting   Jardiance [Empagliflozin]     Pt does not like this medication   Niacin And Related Itching   Nsaids     Kidney damage    Other Diarrhea and Nausea And Vomiting    PT does not want OPIOIDS   GLP-1 drugs - Nausea, vomiting, diarrhea, belching   Statins Itching    ROS Review of Systems  Constitutional:  Negative for fatigue and fever.  Eyes:  Negative for visual disturbance.  Respiratory:  Negative for chest tightness and shortness of breath.   Cardiovascular:  Negative for chest pain and palpitations.  Neurological:  Negative for dizziness and headaches.      Objective:    Physical Exam HENT:     Head: Normocephalic.     Right Ear: External ear normal.     Left Ear: External ear normal.     Nose: No congestion or rhinorrhea.     Mouth/Throat:     Mouth: Mucous membranes are moist.  Cardiovascular:     Rate and Rhythm: Regular rhythm.     Heart sounds: No murmur heard. Pulmonary:     Effort: No respiratory distress.     Breath sounds: Normal breath sounds.  Neurological:     Mental Status: He is alert.     BP 135/76   Pulse 66   Wt 195 lb 0.6 oz (88.5 kg)   SpO2 96%   BMI 31.01 kg/m  Wt Readings from Last 3 Encounters:  12/13/22 195 lb 0.6 oz (88.5 kg)  10/28/22  195 lb (88.5 kg)  10/24/22 195 lb 3.2 oz (88.5 kg)    Lab Results  Component Value Date   TSH 2.990 10/02/2022   Lab Results  Component Value Date   WBC 4.5 10/02/2022   HGB 11.6 (L) 10/02/2022   HCT 35.6 (L) 10/02/2022   MCV 88 10/02/2022   PLT 179 10/02/2022   Lab Results  Component Value Date   NA 133 (L) 10/02/2022   K 4.1 10/02/2022   CO2 21 10/02/2022   GLUCOSE 155 (H) 10/02/2022   BUN 16 10/02/2022   CREATININE 0.98 10/02/2022   BILITOT 0.3 10/02/2022   ALKPHOS 93 10/02/2022   AST 24 10/02/2022    ALT 23 10/02/2022   PROT 6.8 10/02/2022   ALBUMIN 3.9 10/02/2022   CALCIUM 9.6 10/02/2022   ANIONGAP 8 06/11/2022   EGFR 79 10/02/2022   Lab Results  Component Value Date   CHOL 140 10/02/2022   Lab Results  Component Value Date   HDL 43 10/02/2022   Lab Results  Component Value Date   LDLCALC 84 10/02/2022   Lab Results  Component Value Date   TRIG 66 10/02/2022   Lab Results  Component Value Date   CHOLHDL 3.3 10/02/2022   Lab Results  Component Value Date   HGBA1C 8.4 (H) 10/02/2022      Assessment & Plan:  Tinea of groin Assessment & Plan: Chronic condition Complains of burning and itching in the groin Has occasional flareups Recent flareup occurred a week ago Will send a prescription for fluconazole to take for flareup episodes   Orders: -     Fluconazole; Take 1 tablet (150 mg total) by mouth Once PRN for up to 30 doses.  Dispense: 30 tablet; Refill: 0  Chronic diarrhea Assessment & Plan: Follows up with GI   Other specified hypothyroidism Assessment & Plan: Stable on Synthroid 100 mcg daily No refills needed today  Orders: -     TSH + free T4  Diabetes mellitus type 2 in nonobese ([name]) -     Hemoglobin A1c  Vitamin D deficiency -     VITAMIN D 25 Hydroxy (Vit-D Deficiency, Fractures)  Mixed hyperlipidemia -     CBC with Differential/Platelet -     CMP14+EGFR -     Lipid panel    Follow-up: Return in about 3 months (around 03/15/2023).   Alvira Monday, FNP

## 2022-12-13 NOTE — Assessment & Plan Note (Signed)
Follows up with GI 

## 2022-12-13 NOTE — Assessment & Plan Note (Signed)
Stable on Synthroid 100 mcg daily No refills needed today

## 2022-12-13 NOTE — Patient Instructions (Signed)
I appreciate the opportunity to provide care to you today!    Follow up:  3 months  Labs: please stop by the lab during the week to get your blood drawn (CBC, CMP, TSH, Lipid profile, HgA1c, Vit D)  Please pick up your prescription at the pharmacy    Please continue to a heart-healthy diet and increase your physical activities. Try to exercise for 67mns at least five days a week.   Physical activity helps: Lower your blood glucose, improve your heart health, lower your blood pressure and cholesterol, burn calories to help manage her weight, gave you energy, lower stress, and improve his sleep.  The American diabetes Association (ADA) recommends being active for 2-1/2 hours (150 minutes) or more week.  Exercise for 30 minutes, 5 days a week (150 minutes total)    It was a pleasure to see you and I look forward to continuing to work together on your health and well-being. Please do not hesitate to call the office if you need care or have questions about your care.   Have a wonderful day and week. With Gratitude, GAlvira MondayMSN, FNP-BC

## 2022-12-13 NOTE — Assessment & Plan Note (Signed)
Chronic condition Complains of burning and itching in the groin Has occasional flareups Recent flareup occurred a week ago Will send a prescription for fluconazole to take for flareup episodes

## 2022-12-14 ENCOUNTER — Other Ambulatory Visit: Payer: Self-pay | Admitting: Family Medicine

## 2022-12-14 DIAGNOSIS — E039 Hypothyroidism, unspecified: Secondary | ICD-10-CM
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
## 2022-12-24 ENCOUNTER — Ambulatory Visit (INDEPENDENT_AMBULATORY_CARE_PROVIDER_SITE_OTHER): Payer: Medicare HMO | Admitting: Gastroenterology

## 2022-12-24 ENCOUNTER — Encounter: Payer: Self-pay | Admitting: Gastroenterology

## 2022-12-24 VITALS — BP 119/77 | HR 71 | Temp 97.8°F | Ht 66.5 in | Wt 195.9 lb

## 2022-12-24 DIAGNOSIS — D649 Anemia, unspecified: Secondary | ICD-10-CM

## 2022-12-24 DIAGNOSIS — K219 Gastro-esophageal reflux disease without esophagitis: Secondary | ICD-10-CM | POA: Diagnosis not present

## 2022-12-24 DIAGNOSIS — R197 Diarrhea, unspecified: Secondary | ICD-10-CM | POA: Diagnosis not present

## 2022-12-24 MED ORDER — PSYLLIUM 48.57 % PO POWD: ORAL | 0 refills | Status: DC

## 2022-12-24 MED ORDER — SACCHAROMYCES BOULARDII 250 MG PO CAPS: 250.0000 mg | ORAL_CAPSULE | Freq: Two times a day (BID) | ORAL | 1 refills | Status: DC

## 2022-12-24 NOTE — Patient Instructions (Addendum)
Continue pantoprazole once daily.   I have sent in florastor for you to start daily as a probiotic. If insurance will not cover this please ask what the alternatives are or you may get an over the counter probiotic such as align or [employer]ips colon health. The other option is a new probiotic called visibiome. It is located behind the counter in the pharmacy fridge.   If diarrhea returns then you may use imodium as needed and hold if constipation occurs. Please let me know and I can order stool studies and any additional lab work without a follow up visit.   I think you would benefit from extra fiber in your diet. Take benefiber or metamucil. If needed we could try metamucil through insurance but benefiber is not usually covered by insurance.   It was a pleasure to see you today. I want to create trusting relationships with patients. If you receive a survey regarding your visit,  I greatly appreciate you taking time to fill this out on paper or through your MyChart. I value your feedback.  Venetia Night, MSN, FNP-BC, AGACNP-BC [facility]

## 2022-12-25 ENCOUNTER — Encounter: Payer: Self-pay | Admitting: Gastroenterology

## 2022-12-26 ENCOUNTER — Other Ambulatory Visit: Payer: Self-pay | Admitting: Gastroenterology

## 2022-12-26 ENCOUNTER — Telehealth: Payer: Self-pay | Admitting: *Deleted

## 2022-12-26 MED ORDER — RESTORA PO CAPS: 1.0000 | ORAL_CAPSULE | Freq: Every day | ORAL | 2 refills | Status: DC

## 2022-12-26 NOTE — Telephone Encounter (Signed)
Pt called and states the probiotic is too expensive, is there something else he can take.

## 2022-12-27 NOTE — Telephone Encounter (Signed)
Spoke to pt, informed him of recommendations. Pt voiced understanding. Pt states he can not afford 20$-50$ a month. I left some samples of Restora at the front.

## 2023-01-02 ENCOUNTER — Telehealth: Payer: Self-pay | Admitting: *Deleted

## 2023-01-02 NOTE — Telephone Encounter (Signed)
Pt called and states that Restora is helping some, but it can't afford this medications either. Please advise as to what other medications he can take.

## 2023-01-09 ENCOUNTER — Other Ambulatory Visit: Payer: Self-pay | Admitting: Gastroenterology

## 2023-01-09 MED ORDER — RIFAXIMIN 550 MG PO TABS: 550.0000 mg | ORAL_TABLET | Freq: Three times a day (TID) | ORAL | 0 refills | Status: AC

## 2023-01-09 NOTE — Telephone Encounter (Signed)
Can you send Xifaxin to his pharmacy? He does have IBS right?

## 2023-01-09 NOTE — Telephone Encounter (Signed)
Noted  

## 2023-01-12 ENCOUNTER — Other Ambulatory Visit: Payer: Self-pay | Admitting: Family Medicine

## 2023-01-16 NOTE — Telephone Encounter (Signed)
Spoke to pt, he informed me that Restora made him constipated. But, yesterday he had diarrhea all day. Please advise.

## 2023-01-17 ENCOUNTER — Encounter: Payer: Self-pay | Admitting: Internal Medicine

## 2023-01-23 ENCOUNTER — Encounter: Payer: Self-pay | Admitting: "Endocrinology

## 2023-01-23 ENCOUNTER — Other Ambulatory Visit: Payer: Self-pay | Admitting: "Endocrinology

## 2023-01-23 ENCOUNTER — Ambulatory Visit (INDEPENDENT_AMBULATORY_CARE_PROVIDER_SITE_OTHER): Payer: Medicare HMO | Admitting: "Endocrinology

## 2023-01-23 VITALS — BP 126/64 | HR 64 | Ht 66.5 in | Wt 197.0 lb

## 2023-01-23 DIAGNOSIS — E039 Hypothyroidism, unspecified: Secondary | ICD-10-CM

## 2023-01-23 DIAGNOSIS — I10 Essential (primary) hypertension: Secondary | ICD-10-CM | POA: Diagnosis not present

## 2023-01-23 DIAGNOSIS — E782 Mixed hyperlipidemia: Secondary | ICD-10-CM

## 2023-01-23 DIAGNOSIS — E1159 Type 2 diabetes mellitus with other circulatory complications: Secondary | ICD-10-CM | POA: Diagnosis not present

## 2023-01-23 MED ORDER — METFORMIN HCL 1000 MG PO TABS: ORAL_TABLET | ORAL | 1 refills | Status: DC

## 2023-01-23 MED ORDER — GLIPIZIDE ER 5 MG PO TB24: ORAL_TABLET | ORAL | 1 refills | Status: DC

## 2023-01-23 MED ORDER — TRESIBA FLEXTOUCH 100 UNIT/ML ~~LOC~~ SOPN: 35.0000 [IU] | PEN_INJECTOR | Freq: Every day | SUBCUTANEOUS | 1 refills | Status: DC

## 2023-01-23 NOTE — Progress Notes (Signed)
01/24/2023, 2:14 PM   Endocrinology follow-up note  Subjective:    Patient ID: Ryan Herrera, male    DOB: 29-Oct-1945.  Ryan Herrera is being seen in follow-up after he was feeling consultation for management of currently uncontrolled symptomatic diabetes requested by  Gilmore Laroche, FNP.   Past Medical History:  Diagnosis Date   Arthritis    hands   Coronary artery disease    Diabetes mellitus without complication    GERD (gastroesophageal reflux disease)    H/O poliomyelitis    age 77.  now has "weak stomach muscles"   Hypertension    IBS (irritable bowel syndrome)    Iron deficiency anemia 09/22/2018   Myocardial infarct    Renal insufficiency    Thyroid disease     Past Surgical History:  Procedure Laterality Date   BACK SURGERY  1967   BACK SURGERY  10/04/2005   benign tumor removal  2004   BIOPSY  11/18/2022   Procedure: BIOPSY;  Surgeon: Lanelle Bal, DO;  [facility];  Service: Endoscopy;;   COLONOSCOPY WITH PROPOFOL N/A 11/18/2022   Procedure: COLONOSCOPY WITH PROPOFOL;  Surgeon: Lanelle Bal, DO;  [facility]: [facility];  Service: Endoscopy;  Laterality: N/A;  8:30 am, pt did not want early appt. moved down (per PAT)   CORONARY ARTERY BYPASS GRAFT  2008   [facility] - 3 vessel   CORONARY ARTERY BYPASS GRAFT N/A 03/28/2022   Procedure: REDO CORONARY ARTERY BYPASS GRAFTING (CABG) X2 BYPASSES USING OPEN LEFT RADIAL ARTERY AND ENDOSCOPIC RIGHT GREATER SAPHENOUS VEIN HARVEST;  Surgeon: Loreli Slot, MD;  Location: [facility];  Service: Open Heart Surgery;  Laterality: N/A;   CYSTOSCOPY WITH INSERTION OF UROLIFT     ESOPHAGOGASTRODUODENOSCOPY (EGD) WITH PROPOFOL N/A 11/18/2022   Procedure: ESOPHAGOGASTRODUODENOSCOPY (EGD) WITH PROPOFOL;  Surgeon: Lanelle Bal, DO;  [facility];  Service: Endoscopy;  Laterality: N/A;   HERNIA REPAIR Bilateral    inguinal   HYDROCELE EXCISION / REPAIR      JOINT REPLACEMENT     left knee x2, rright x1   LEFT HEART CATH AND CORS/GRAFTS ANGIOGRAPHY N/A 03/25/2022   Procedure: LEFT HEART CATH AND CORS/GRAFTS ANGIOGRAPHY;  Surgeon: Corky Crafts, MD;  Location: [facility];  Service: Cardiovascular;  Laterality: N/A;   POLYPECTOMY  11/18/2022   Procedure: POLYPECTOMY;  Surgeon: Lanelle Bal, DO;  [facility];  Service: Endoscopy;;   PROSTATE SURGERY N/A 01/2021   TEE WITHOUT CARDIOVERSION N/A 03/28/2022   Procedure: TRANSESOPHAGEAL ECHOCARDIOGRAM (TEE);  Surgeon: Loreli Slot, MD;  Location: [facility] OR;  Service: Open Heart Surgery;  Laterality: N/A;   TONSILLECTOMY      Social History   Socioeconomic History   Marital status: Divorced    Spouse name: Not on file   Number of children: 1   Years of education: 12   Highest education level: Master's degree (e.g., MA, MS, MEng, MEd, MSW, MBA)  Occupational History   Occupation: retired  Tobacco Use   Smoking status: Former    Packs/day: 1.00    Years: 38.00    Additional pack years: 0.00    Total pack years: 38.00    Types: Cigarettes    Quit date: 12/1996    Years since quitting: 26.1   Smokeless tobacco: Never  Vaping Use   Vaping Use: Never used  Substance and Sexual Activity   Alcohol use: Yes  Comment: occassionally   Drug use: No   Sexual activity: Not Currently  Other Topics Concern   Not on file  Social History Narrative   Lives with his significant other, pt is retired    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  (12/05/2022)   Overall Financial Resource Strain (CARDIA)    Difficulty of Paying Living Expenses: Not hard at all  Food Insecurity: No Food Insecurity (12/05/2022)   Hunger Vital Sign    Worried About Running Out of Food in the Last Year: Never true    Ran Out of Food in the Last Year: Never true  Transportation Needs: No Transportation Needs (12/05/2022)   PRAPARE - Scientist, research (physical sciences) (Medical): No    Lack of Transportation (Non-Medical): No  Physical Activity: Inactive (12/05/2022)   Exercise Vital Sign    Days of Exercise per Week: 0 days    Minutes of Exercise per Session: 0 min  Stress: No Stress Concern Present (12/05/2022)   [employer] of Occupational Health - Occupational Stress Questionnaire    Feeling of Stress : Only a little  Social Connections: Socially Isolated (12/05/2022)   Social Connection and Isolation Panel [NHANES]    Frequency of Communication with Friends and Family: More than three times a week    Frequency of Social Gatherings with Friends and Family: Once a week    Attends Religious Services: Never    Database administrator or Organizations: No    Attends Engineer, structural: Never    Marital Status: Divorced    Family History  Problem Relation Age of Onset   Hypertension Mother    Heart attack Mother    Osteoarthritis Mother    Stroke Father    Hypertension Father    Stroke Sister    Stroke Sister    Colon cancer Neg Hx    Prostate cancer Neg Hx    Cancer - Lung Neg Hx     Outpatient Encounter Medications as of 01/23/2023  Medication Sig   Probiotic Product (RESTORA) CAPS Take 1 capsule by mouth daily.   acetaminophen (TYLENOL) 650 MG CR tablet Take 1,300 mg by mouth every 8 (eight) hours as needed for pain.   allopurinol (ZYLOPRIM) 100 MG tablet Take 1 tablet (100 mg total) by mouth daily.   aspirin EC 81 MG tablet Take 81 mg by mouth daily.   Carboxymethylcellulose Sodium (THERATEARS PF OP) Place 1 drop into both eyes daily as needed (dry eyes).   carvedilol (COREG) 6.25 MG tablet Take 1 tablet (6.25 mg total) by mouth 2 (two) times daily.   Cholecalciferol (VITAMIN D) 50 MCG (2000 UT) tablet Take 2,000 Units by mouth daily. (Patient not taking: Reported on 12/24/2022)   clotrimazole-betamethasone (LOTRISONE) cream Apply 1 Application topically 2 (two) times daily as needed (rash).   cyanocobalamin  (VITAMIN B12) 1000 MCG tablet Take 1,000 mcg by mouth daily.   Evolocumab (REPATHA SURECLICK) 140 MG/ML SOAJ INJECT 140 MG INTO THE SKIN EVERY 14 (FOURTEEN) DAYS.   ezetimibe (ZETIA) 10 MG tablet TAKE 1 TABLET BY MOUTH EVERY DAY   glipiZIDE (GLUCOTROL XL) 5 MG 24 hr tablet TAKE 1 TABLET BY MOUTH EVERY DAY WITH BREAKFAST   glucose blood test strip 1 each by Other route as needed. Use to check blood glucose twice daily as instructed   hydrochlorothiazide (HYDRODIURIL) 25 MG tablet TAKE 1 TABLET (25 MG TOTAL) BY MOUTH DAILY.   Insulin Pen  Needle (BD PEN NEEDLE NANO 2ND GEN) 32G X 4 MM MISC Use as directed to inject insulin daily   ketoconazole (NIZORAL) 2 % cream Apply 1 Application topically daily.   ketoconazole (NIZORAL) 2 % shampoo Apply 1 Application topically See admin instructions. Uses a small amount with every shower   levothyroxine (SYNTHROID) 100 MCG tablet TAKE 1 TABLET BY MOUTH EVERY DAY BEFORE BREAKFAST   metFORMIN (GLUCOPHAGE) 1000 MG tablet TAKE 1 TABLET (1,000 MG TOTAL) BY MOUTH TWICE A DAY WITH FOOD   pantoprazole (PROTONIX) 40 MG tablet Take 1 tablet (40 mg total) by mouth daily.   Psyllium 48.57 % POWD 2-3 teaspoons daily in 8 oz of water.   [EXPIRED] rifaximin (XIFAXAN) 550 MG TABS tablet Take 1 tablet (550 mg total) by mouth 3 (three) times daily for 14 days.   saccharomyces boulardii (FLORASTOR) 250 MG capsule Take 1 capsule (250 mg total) by mouth 2 (two) times daily.   tadalafil (CIALIS) 5 MG tablet Take 5 mg by mouth daily.   triamcinolone cream (KENALOG) 0.5 % Apply 1 Application topically 2 (two) times daily as needed (tick bites).   UNABLE TO FIND Lift chair DX: M47.812   UNABLE TO FIND Blood pressure cuff DX: I10   valACYclovir (VALTREX) 500 MG tablet TAKE 1 TABLET BY MOUTH TWICE A DAY   [DISCONTINUED] glipiZIDE (GLUCOTROL XL) 5 MG 24 hr tablet TAKE 1 TABLET BY MOUTH EVERY DAY WITH BREAKFAST   [DISCONTINUED] insulin degludec (TRESIBA FLEXTOUCH) 100 UNIT/ML FlexTouch  Pen Inject 30 Units into the skin at bedtime.   [DISCONTINUED] insulin degludec (TRESIBA FLEXTOUCH) 100 UNIT/ML FlexTouch Pen Inject 35 Units into the skin at bedtime.   [DISCONTINUED] metFORMIN (GLUCOPHAGE) 1000 MG tablet TAKE 1 TABLET (1,000 MG TOTAL) BY MOUTH TWICE A DAY WITH FOOD   No facility-administered encounter medications on file as of 01/23/2023.    ALLERGIES: Allergies  Allergen Reactions   Myrbetriq [Mirabegron] Nausea And Vomiting   Jardiance [Empagliflozin]     Pt does not like this medication   Niacin And Related Itching   Nsaids     Kidney damage    Other Diarrhea and Nausea And Vomiting    PT does not want OPIOIDS   GLP-1 drugs - Nausea, vomiting, diarrhea, belching   Statins Itching    VACCINATION STATUS: Immunization History  Administered Date(s) Administered   Fluad Quad(high Dose 65+) 06/14/2022   Influenza Split 07/19/2013   Influenza, High Dose Seasonal PF 06/13/2015, 07/18/2016, 06/11/2017, 07/08/2018, 07/02/2019, 09/15/2020, 07/27/2021   Influenza,inj,Quad PF,6+ Mos 08/08/2014   Influenza-Unspecified 07/15/2012, 07/15/2013, 08/08/2014, 06/13/2015, 07/18/2016   PFIZER Comirnaty(Gray Top)Covid-19 Tri-Sucrose Vaccine 11/27/2019, 12/19/2019, 07/26/2020, 04/20/2021   PPD Test 01/22/2013, 01/22/2013   Pfizer Covid-19 Vaccine Bivalent Booster 92yrs & up 07/27/2021   Pneumococcal Conjugate-13 05/04/2014   Pneumococcal Polysaccharide-23 07/15/2012   Td 02/15/2003   Tdap 05/04/2014, 03/09/2022   Zoster Recombinat (Shingrix) 02/12/2017, 02/13/2017, 06/04/2017    Diabetes He presents for his follow-up diabetic visit. He has type 2 diabetes mellitus. Onset time: He was diagnosed at approximate age of 48 years. His disease course has been worsening. There are no hypoglycemic associated symptoms. Pertinent negatives for hypoglycemia include no confusion, headaches, pallor or seizures. Pertinent negatives for diabetes include no chest pain, no fatigue, no polydipsia,  no polyphagia, no polyuria and no weakness. There are no hypoglycemic complications. Symptoms are worsening. Diabetic complications include heart disease. Risk factors for coronary artery disease include dyslipidemia, diabetes mellitus, family history, obesity, male sex, hypertension, tobacco  exposure and sedentary lifestyle. Current diabetic treatment includes insulin injections (He is currently on Tresiba 30 units daily, metformin 1000 mg p.o. twice daily, glipizide 10 mg p.o. twice daily, Jardiance 10 mg p.o. once a day.). His weight is fluctuating minimally. He is following a generally unhealthy diet. When asked about meal planning, he reported none. He rarely participates in exercise. His home blood glucose trend is fluctuating minimally. His breakfast blood glucose range is generally 110-130 mg/dl. His overall blood glucose range is 110-130 mg/dl. Ryan Herrera presents with a meter showing only fasting glycemic profile averaging 138.  However, his previsit labs show A1c of 9.3%.  This is worsening from his previous A1c of 8.4%.  He remains on Tresiba 30 units nightly, metformin 1000 mg p.o. twice daily, and glipizide 5 mg p.o. once a day.) An ACE inhibitor/angiotensin II receptor blocker is being taken.  Hyperlipidemia This is a chronic problem. The current episode started more than 1 year ago. The problem is controlled. Exacerbating diseases include diabetes. Pertinent negatives include no chest pain, myalgias or shortness of breath. Treatments tried: He is on Repatha injection as well as Zetia 10 mg p.o. daily. Risk factors for coronary artery disease include dyslipidemia, diabetes mellitus, family history, obesity, male sex, hypertension and a sedentary lifestyle.  Hypertension This is a chronic problem. The current episode started more than 1 year ago. Pertinent negatives include no chest pain, headaches, neck pain, palpitations or shortness of breath. Risk factors for coronary artery disease include  family history, dyslipidemia, diabetes mellitus, male gender, obesity and sedentary lifestyle. Past treatments include angiotensin blockers. Hypertensive end-organ damage includes CAD/MI.       Objective:       01/23/2023    1:01 PM 12/24/2022    3:23 PM 12/24/2022    3:18 PM  Vitals with BMI  Height 5' 6.5"  5' 6.5"  Weight 197 lbs  195 lbs 14 oz  BMI 31.32  31.15  Systolic 126 119 098  Diastolic 64 77 82  Pulse 64 71 76    BP 126/64   Pulse 64   Ht 5' 6.5" (1.689 m)   Wt 197 lb (89.4 kg)   BMI 31.32 kg/m   Wt Readings from Last 3 Encounters:  01/23/23 197 lb (89.4 kg)  12/24/22 195 lb 14.4 oz (88.9 kg)  12/13/22 195 lb 0.6 oz (88.5 kg)     CMP ( most recent) CMP     Component Value Date/Time   NA 137 01/22/2023 1301   NA 139 04/29/2014 1500   K 3.9 01/22/2023 1301   K 4.2 04/29/2014 1500   CL 99 01/22/2023 1301   CL 103 04/29/2014 1500   CO2 23 01/22/2023 1301   CO2 27 04/29/2014 1500   GLUCOSE 70 01/22/2023 1301   GLUCOSE 196 (H) 06/11/2022 1859   GLUCOSE 111 (H) 04/29/2014 1500   BUN 12 01/22/2023 1301   BUN 16 04/29/2014 1500   CREATININE 1.03 01/22/2023 1301   CREATININE 1.14 04/29/2014 1500   CALCIUM 9.5 01/22/2023 1301   CALCIUM 9.2 04/29/2014 1500   PROT 6.9 01/22/2023 1301   ALBUMIN 4.0 01/22/2023 1301   AST 22 01/22/2023 1301   ALT 24 01/22/2023 1301   ALKPHOS 94 01/22/2023 1301   BILITOT 0.3 01/22/2023 1301   GFRNONAA >60 06/11/2022 1859   GFRNONAA >60 04/29/2014 1500   GFRAA >60 05/12/2020 1848   GFRAA >60 04/29/2014 1500     Diabetic Labs (most recent): Lab Results  Component Value  Date   HGBA1C 9.3 (H) 01/22/2023   HGBA1C 8.4 (H) 10/02/2022   HGBA1C 7.3 (A) 06/20/2022    Lipid Panel     Component Value Date/Time   CHOL 131 01/22/2023 1301   TRIG 72 01/22/2023 1301   HDL 38 (L) 01/22/2023 1301   CHOLHDL 3.4 01/22/2023 1301   LDLCALC 78 01/22/2023 1301   LABVLDL 15 01/22/2023 1301        Lab Results  Component Value  Date   TSH 3.950 01/22/2023   TSH 2.990 10/02/2022   TSH 2.349 03/24/2022   TSH 5.760 (H) 03/12/2022   TSH 4.124 12/21/2018   TSH 5.545 (H) 09/22/2018   TSH 4.84 (H) 04/29/2014   FREET4 1.62 01/22/2023   FREET4 1.44 10/02/2022   FREET4 1.14 (H) 03/24/2022   FREET4 1.01 12/21/2018      Assessment & Plan:   1. DM type 2 causing vascular disease ([name])  - Ryan Herrera has currently uncontrolled symptomatic type 2 DM since  77 years of age.  Waddell presents with a meter showing only fasting glycemic profile averaging 138.  However, his previsit labs show A1c of 9.3%.  This is worsening from his previous A1c of 8.4%.  He remains on Tresiba 30 units nightly, metformin 1000 mg p.o. twice daily, and glipizide 5 mg p.o. once a day.   Recent labs reviewed.  - I had a long discussion with him about the progressive nature of diabetes and the pathology behind its complications. -his diabetes is complicated by coronary artery disease status post cardiac bypass in 2008, recurrent coronary artery disease, obesity/sedentary life, comorbid hypertension /hyperlipidemia, history of smoking and he remains at a high risk for more acute and chronic complications which include CAD, CVA, CKD, retinopathy, and neuropathy. These are all discussed in detail with him.  - I discussed all available options of managing his diabetes including de-escalation of medications. I have counseled him on diet  and weight management  by adopting a Whole Food , Plant Predominant  ( WFPP) nutrition as recommended by [employer] of Lifestyle Medicine. Patient is encouraged to switch to  unprocessed or minimally processed  complex starch, adequate protein intake (mainly plant source), minimal liquid fat ( mainly vegetable oils), plenty of fruits, and vegetables.  -  he is advised to stick to a routine mealtimes to eat 3 complete meals a day and snack only when necessary ( to snack only to correct hypoglycemia BG <70 day  time or <100 at night).   - he acknowledges that there is a room for improvement in his food and drink choices. - Suggestion is made for him to avoid simple carbohydrates  from his diet including Cakes, Sweet Desserts, Ice Cream, Soda (diet and regular), Sweet Tea, Candies, Chips, Cookies, Store Bought Juices, Alcohol , Artificial Sweeteners,  Coffee Creamer, and "Sugar-free" Products, Lemonade. This will help patient to have more stable blood glucose profile and potentially avoid unintended weight gain.  The following Lifestyle Medicine recommendations according to American College of Lifestyle Medicine  [facility]) were discussed and and offered to patient and he  agrees to start the journey:  A. Whole Foods, Plant-Based Nutrition comprising of fruits and vegetables, plant-based proteins, whole-grain carbohydrates was discussed in detail with the patient.   A list for source of those nutrients were also provided to the patient.  Patient will use only water or unsweetened tea for hydration. B.  The need to stay away from risky substances including alcohol, smoking;  obtaining 7 to 9 hours of restorative sleep, at least 150 minutes of moderate intensity exercise weekly, the importance of healthy social connections,  and stress management techniques were discussed. C.  A full color page of  Calorie density of various food groups per pound showing examples of each food groups was provided to the patient.   - he will be scheduled with Norm Salt, RDN, CDE for individualized diabetes education.  - I have approached him with the following plan to manage  his diabetes and patient agrees:   -Based on his presentation with significantly above target glycemic profile, he was approached for multiple daily injections of insulin.  However, he is concerned about supplies of the Medi, and not willing to monitor blood glucose more than once a day at this time.   I discussed and ordered a CGM device for him.   If  this is covered for him, he will call me back so we can adjust his treatment to at least premixed insulin twice a day instead of just basal insulin.    In the meantime, he is advised to increase his [place] to 35 units nightly, continue metformin 1000 mg p.o. twice daily, continue glipizide 5 mg p.o. daily at breakfast.    - he is encouraged to call clinic for blood glucose levels less than 70 or above 200 mg /dl fasting Z6/XWRU.   - Specific targets for  A1c;  LDL, HDL,  and Triglycerides were discussed with the patient.  2) Blood Pressure /Hypertension:   -His blood pressure is controlled to target.  he is advised to continue his current medications including losartan 25 mg p.o. daily with breakfast .   3) Lipids/Hyperlipidemia:   Review of his recent lipid panel showed controlled LDL at 78.  He does not tolerate statins.   He is advised to continue Repatha 140 mg every 14 days, Zetia 10 mg every night.   Side effects and precautions discussed with him.  If he indeed engages enough for  whole food , plant-based diet, he had a chance to come off of these antilipid medications.  4)  Weight/Diet:  Body mass index is 31.32 kg/m.  -   clearly complicating his diabetes care.   he is  a candidate for weight loss. I discussed with him the fact that loss of 5 - 10% of his  current body weight will have the most impact on his diabetes management.  The above detailed  AC[facility] recommendations for nutrition, exercise, sleep, social life, avoidance of risky substances, the need for restorative sleep   information will also detailed on discharge instructions.  5) Chronic Care/Health Maintenance:  -he  is on ARB  medications and  is encouraged to initiate and continue to follow up with Ophthalmology, Dentist,  Podiatrist at least yearly or according to recommendations, and advised to   stay away from smoking. I have recommended yearly flu vaccine and pneumonia vaccine at least every 5 years; moderate intensity  exercise for up to 150 minutes weekly; and  sleep for 7- 9 hours a day.   6) hypothyroidism: Circumstance of diagnosis not available to review.  His recent thyroid function tests are consistent with appropriate replacement.  He is advised to continue levothyroxine 100 mcg p.o. daily before breakfast.   - We discussed about the correct intake of his thyroid hormone, on empty stomach at fasting, with water, separated by at least 30 minutes from breakfast and other medications,  and separated by more than  4 hours from calcium, iron, multivitamins, acid reflux medications (PPIs). -Patient is made aware of the fact that thyroid hormone replacement is needed for life, dose to be adjusted by periodic monitoring of thyroid function tests.   - he is  advised to maintain close follow up with Gilmore Laroche, FNP for primary care needs, as well as his other providers for optimal and coordinated care.    I spent  26  minutes in the care of the patient today including review of labs from Thyroid Function, CMP, and other relevant labs ; imaging/biopsy records (current and previous including abstractions from other facilities); face-to-face time discussing  his lab results and symptoms, medications doses, his options of short and long term treatment based on the latest standards of care / guidelines;   and documenting the encounter.  Ryan Herrera  participated in the discussions, expressed understanding, and voiced agreement with the above plans.  All questions were answered to his satisfaction. he is encouraged to contact clinic should he have any questions or concerns prior to his return visit.    Follow up plan: - Return in about 4 months (around 05/25/2023) for Bring Meter/CGM Device/Logs- A1c in Office.  Marquis Lunch, MD [facility] [address] [place], [place] [address] Phone: [phone]  Fax: [phone]    01/24/2023, 2:14 PM  This  note was partially dictated with voice recognition software. Similar sounding words can be transcribed inadequately or may not  be corrected upon review.

## 2023-01-23 NOTE — Patient Instructions (Signed)

## 2023-01-24 LAB — CBC WITH DIFFERENTIAL/PLATELET
Basophils Absolute: 0 10*3/uL (ref 0.0–0.2)
Basos: 1 %
EOS (ABSOLUTE): 0.2 10*3/uL (ref 0.0–0.4)
Eos: 4 %
Hematocrit: 37.2 % — ABNORMAL LOW (ref 37.5–51.0)
Hemoglobin: 12.1 g/dL — ABNORMAL LOW (ref 13.0–17.7)
Immature Grans (Abs): 0 10*3/uL (ref 0.0–0.1)
Immature Granulocytes: 0 %
Lymphocytes Absolute: 1.3 10*3/uL (ref 0.7–3.1)
Lymphs: 28 %
MCH: 29.1 pg (ref 26.6–33.0)
MCHC: 32.5 g/dL (ref 31.5–35.7)
MCV: 89 fL (ref 79–97)
Monocytes Absolute: 0.4 10*3/uL (ref 0.1–0.9)
Monocytes: 9 %
Neutrophils Absolute: 2.8 10*3/uL (ref 1.4–7.0)
Neutrophils: 58 %
Platelets: 170 10*3/uL (ref 150–450)
RBC: 4.16 x10E6/uL (ref 4.14–5.80)
RDW: 14.3 % (ref 11.6–15.4)
WBC: 4.7 10*3/uL (ref 3.4–10.8)

## 2023-01-24 LAB — CMP14+EGFR
ALT: 24 IU/L (ref 0–44)
AST: 22 IU/L (ref 0–40)
Albumin/Globulin Ratio: 1.4 (ref 1.2–2.2)
Albumin: 4 g/dL (ref 3.8–4.8)
Alkaline Phosphatase: 94 IU/L (ref 44–121)
BUN/Creatinine Ratio: 12 (ref 10–24)
BUN: 12 mg/dL (ref 8–27)
Bilirubin Total: 0.3 mg/dL (ref 0.0–1.2)
CO2: 23 mmol/L (ref 20–29)
Calcium: 9.5 mg/dL (ref 8.6–10.2)
Chloride: 99 mmol/L (ref 96–106)
Creatinine, Ser: 1.03 mg/dL (ref 0.76–1.27)
Globulin, Total: 2.9 g/dL (ref 1.5–4.5)
Glucose: 70 mg/dL (ref 70–99)
Potassium: 3.9 mmol/L (ref 3.5–5.2)
Sodium: 137 mmol/L (ref 134–144)
Total Protein: 6.9 g/dL (ref 6.0–8.5)
eGFR: 74 mL/min/{1.73_m2} (ref >59)

## 2023-01-24 LAB — LIPID PANEL
Chol/HDL Ratio: 3.4 ratio (ref 0.0–5.0)
Cholesterol, Total: 131 mg/dL (ref 100–199)
HDL: 38 mg/dL — ABNORMAL LOW (ref >39)
LDL Chol Calc (NIH): 78 mg/dL (ref 0–99)
Triglycerides: 72 mg/dL (ref 0–149)
VLDL Cholesterol Cal: 15 mg/dL (ref 5–40)

## 2023-01-24 LAB — VITAMIN D 25 HYDROXY (VIT D DEFICIENCY, FRACTURES): Vit D, 25-Hydroxy: 40.5 ng/mL (ref 30.0–100.0)

## 2023-01-24 LAB — TSH+FREE T4
Free T4: 1.62 ng/dL (ref 0.82–1.77)
TSH: 3.95 u[IU]/mL (ref 0.450–4.500)

## 2023-01-24 LAB — HEMOGLOBIN A1C
Est. average glucose Bld gHb Est-mCnc: 220 mg/dL
Hgb A1c MFr Bld: 9.3 % — ABNORMAL HIGH (ref 4.8–5.6)

## 2023-01-27 ENCOUNTER — Other Ambulatory Visit: Payer: Self-pay | Admitting: Family Medicine

## 2023-01-27 ENCOUNTER — Other Ambulatory Visit: Payer: Self-pay

## 2023-01-27 DIAGNOSIS — E1159 Type 2 diabetes mellitus with other circulatory complications: Secondary | ICD-10-CM

## 2023-01-27 MED ORDER — TRESIBA FLEXTOUCH 100 UNIT/ML ~~LOC~~ SOPN: 35.0000 [IU] | PEN_INJECTOR | Freq: Every day | SUBCUTANEOUS | 0 refills | Status: DC

## 2023-01-27 MED ORDER — GLIPIZIDE ER 10 MG PO TB24: 10.0000 mg | ORAL_TABLET | Freq: Every day | ORAL | 1 refills | Status: DC

## 2023-01-31 ENCOUNTER — Telehealth: Payer: Self-pay | Admitting: Family Medicine

## 2023-01-31 NOTE — Telephone Encounter (Signed)
Patient was not too happy he has never had to have appt. 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 Patient refused appt

## 2023-01-31 NOTE — Telephone Encounter (Signed)
Pt advise the patient to schedule an appt

## 2023-01-31 NOTE — Telephone Encounter (Signed)
Patient called said has another tick bite today and needs meds called into pharmacy.  Said he will always test positive, he said he has welps can antibiotic call into pharmacy [facility] Call patient if can not call in.

## 2023-02-03 ENCOUNTER — Other Ambulatory Visit: Payer: Self-pay | Admitting: Family Medicine

## 2023-02-03 DIAGNOSIS — B356 Tinea cruris: Secondary | ICD-10-CM

## 2023-02-10 ENCOUNTER — Telehealth: Payer: Self-pay | Admitting: Family Medicine

## 2023-02-10 ENCOUNTER — Other Ambulatory Visit: Payer: Self-pay

## 2023-02-10 NOTE — Telephone Encounter (Signed)
DR Fransico Him taking cutting it back Malachi Bonds prescribes 10 mg Glipizide and per patient Dr Fransico Him prescribes 5 mg extended release. Asked to  remove the 10mg  and keep the 5 mg from Dr Sheila Oats

## 2023-02-10 NOTE — Telephone Encounter (Signed)
Asking to switch providers to Dr Durwin Nora rather have MD provider.

## 2023-02-10 NOTE — Telephone Encounter (Signed)
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

## 2023-02-10 NOTE — Telephone Encounter (Signed)
Updated in chart

## 2023-02-11 NOTE — Telephone Encounter (Signed)
Patient does not want no appointment

## 2023-02-13 ENCOUNTER — Encounter (HOSPITAL_COMMUNITY): Payer: Self-pay | Admitting: Emergency Medicine

## 2023-02-13 ENCOUNTER — Observation Stay (HOSPITAL_BASED_OUTPATIENT_CLINIC_OR_DEPARTMENT_OTHER): Payer: Medicare HMO

## 2023-02-13 ENCOUNTER — Encounter: Payer: Self-pay | Admitting: Oncology

## 2023-02-13 ENCOUNTER — Other Ambulatory Visit: Payer: Self-pay

## 2023-02-13 ENCOUNTER — Emergency Department (HOSPITAL_COMMUNITY): Payer: Medicare HMO

## 2023-02-13 ENCOUNTER — Observation Stay (HOSPITAL_COMMUNITY)
Admission: EM | Admit: 2023-02-13 | Discharge: 2023-02-14 | Disposition: A | Discharge status: Home / Self Care | Payer: Medicare HMO | Attending: Family Medicine | Admitting: Family Medicine

## 2023-02-13 ENCOUNTER — Encounter: Payer: Self-pay | Admitting: *Deleted

## 2023-02-13 DIAGNOSIS — R0789 Other chest pain: Principal | ICD-10-CM | POA: Insufficient documentation

## 2023-02-13 DIAGNOSIS — M545 Low back pain, unspecified: Secondary | ICD-10-CM | POA: Diagnosis present

## 2023-02-13 DIAGNOSIS — Z96652 Presence of left artificial knee joint: Secondary | ICD-10-CM | POA: Insufficient documentation

## 2023-02-13 DIAGNOSIS — Z7982 Long term (current) use of aspirin: Secondary | ICD-10-CM | POA: Diagnosis not present

## 2023-02-13 DIAGNOSIS — I25110 Atherosclerotic heart disease of native coronary artery with unstable angina pectoris: Secondary | ICD-10-CM | POA: Insufficient documentation

## 2023-02-13 DIAGNOSIS — N189 Chronic kidney disease, unspecified: Secondary | ICD-10-CM | POA: Insufficient documentation

## 2023-02-13 DIAGNOSIS — G8929 Other chronic pain: Secondary | ICD-10-CM | POA: Diagnosis not present

## 2023-02-13 DIAGNOSIS — E039 Hypothyroidism, unspecified: Secondary | ICD-10-CM | POA: Diagnosis not present

## 2023-02-13 DIAGNOSIS — Z951 Presence of aortocoronary bypass graft: Secondary | ICD-10-CM | POA: Diagnosis not present

## 2023-02-13 DIAGNOSIS — R079 Chest pain, unspecified: Secondary | ICD-10-CM | POA: Diagnosis not present

## 2023-02-13 DIAGNOSIS — K219 Gastro-esophageal reflux disease without esophagitis: Secondary | ICD-10-CM | POA: Diagnosis present

## 2023-02-13 DIAGNOSIS — Z6831 Body mass index (BMI) 31.0-31.9, adult: Secondary | ICD-10-CM | POA: Diagnosis not present

## 2023-02-13 DIAGNOSIS — E782 Mixed hyperlipidemia: Secondary | ICD-10-CM | POA: Diagnosis not present

## 2023-02-13 DIAGNOSIS — Z87891 Personal history of nicotine dependence: Secondary | ICD-10-CM | POA: Insufficient documentation

## 2023-02-13 DIAGNOSIS — I129 Hypertensive chronic kidney disease with stage 1 through stage 4 chronic kidney disease, or unspecified chronic kidney disease: Secondary | ICD-10-CM | POA: Diagnosis not present

## 2023-02-13 DIAGNOSIS — I200 Unstable angina: Secondary | ICD-10-CM

## 2023-02-13 DIAGNOSIS — I2510 Atherosclerotic heart disease of native coronary artery without angina pectoris: Secondary | ICD-10-CM | POA: Diagnosis not present

## 2023-02-13 DIAGNOSIS — E538 Deficiency of other specified B group vitamins: Secondary | ICD-10-CM | POA: Diagnosis present

## 2023-02-13 DIAGNOSIS — E786 Lipoprotein deficiency: Secondary | ICD-10-CM | POA: Diagnosis present

## 2023-02-13 DIAGNOSIS — E6609 Other obesity due to excess calories: Secondary | ICD-10-CM

## 2023-02-13 DIAGNOSIS — D696 Thrombocytopenia, unspecified: Secondary | ICD-10-CM | POA: Diagnosis not present

## 2023-02-13 DIAGNOSIS — E1122 Type 2 diabetes mellitus with diabetic chronic kidney disease: Secondary | ICD-10-CM | POA: Insufficient documentation

## 2023-02-13 DIAGNOSIS — E1159 Type 2 diabetes mellitus with other circulatory complications: Secondary | ICD-10-CM

## 2023-02-13 DIAGNOSIS — Z87448 Personal history of other diseases of urinary system: Secondary | ICD-10-CM | POA: Diagnosis present

## 2023-02-13 DIAGNOSIS — M19049 Primary osteoarthritis, unspecified hand: Secondary | ICD-10-CM | POA: Diagnosis present

## 2023-02-13 DIAGNOSIS — Z7984 Long term (current) use of oral hypoglycemic drugs: Secondary | ICD-10-CM | POA: Diagnosis not present

## 2023-02-13 DIAGNOSIS — Z794 Long term (current) use of insulin: Secondary | ICD-10-CM | POA: Diagnosis not present

## 2023-02-13 DIAGNOSIS — Z955 Presence of coronary angioplasty implant and graft: Secondary | ICD-10-CM | POA: Diagnosis not present

## 2023-02-13 DIAGNOSIS — E119 Type 2 diabetes mellitus without complications: Secondary | ICD-10-CM

## 2023-02-13 DIAGNOSIS — E11649 Type 2 diabetes mellitus with hypoglycemia without coma: Secondary | ICD-10-CM | POA: Diagnosis present

## 2023-02-13 DIAGNOSIS — I252 Old myocardial infarction: Secondary | ICD-10-CM

## 2023-02-13 LAB — CBC
HCT: 34.8 % — ABNORMAL LOW (ref 39.0–52.0)
Hemoglobin: 11.3 g/dL — ABNORMAL LOW (ref 13.0–17.0)
MCH: 29.1 pg (ref 26.0–34.0)
MCHC: 32.5 g/dL (ref 30.0–36.0)
MCV: 89.7 fL (ref 80.0–100.0)
Platelets: 128 10*3/uL — ABNORMAL LOW (ref 150–400)
RBC: 3.88 MIL/uL — ABNORMAL LOW (ref 4.22–5.81)
RDW: 14.5 % (ref 11.5–15.5)
WBC: 4.6 10*3/uL (ref 4.0–10.5)
nRBC: 0 % (ref 0.0–0.2)

## 2023-02-13 LAB — PROTIME-INR
INR: 0.9 (ref 0.8–1.2)
Prothrombin Time: 12 seconds (ref 11.4–15.2)

## 2023-02-13 LAB — BASIC METABOLIC PANEL
Anion gap: 8 (ref 5–15)
BUN: 14 mg/dL (ref 8–23)
CO2: 26 mmol/L (ref 22–32)
Calcium: 9 mg/dL (ref 8.9–10.3)
Chloride: 101 mmol/L (ref 98–111)
Creatinine, Ser: 0.89 mg/dL (ref 0.61–1.24)
GFR, Estimated: >60 mL/min (ref >60)
Glucose, Bld: 88 mg/dL (ref 70–99)
Potassium: 3.5 mmol/L (ref 3.5–5.1)
Sodium: 135 mmol/L (ref 135–145)

## 2023-02-13 LAB — TROPONIN I (HIGH SENSITIVITY)
Troponin I (High Sensitivity): 8 ng/L (ref <18)
Troponin I (High Sensitivity): 7 ng/L (ref <18)
Troponin I (High Sensitivity): 8 ng/L (ref <18)
Troponin I (High Sensitivity): 7 ng/L (ref <18)
Troponin I (High Sensitivity): 7 ng/L (ref <18)

## 2023-02-13 LAB — ECHOCARDIOGRAM COMPLETE
Area-P 1/2: 2.45 cm2
Height: 66.5 in
S' Lateral: 2.9 cm
Weight: 3093.49 oz

## 2023-02-13 LAB — BRAIN NATRIURETIC PEPTIDE: B Natriuretic Peptide: 154 pg/mL — ABNORMAL HIGH (ref 0.0–100.0)

## 2023-02-13 LAB — GLUCOSE, CAPILLARY: Glucose-Capillary: 123 mg/dL — ABNORMAL HIGH (ref 70–99)

## 2023-02-13 LAB — CBG MONITORING, ED: Glucose-Capillary: 107 mg/dL — ABNORMAL HIGH (ref 70–99)

## 2023-02-13 MED ORDER — HYDROCHLOROTHIAZIDE 25 MG PO TABS: 25.0000 mg | ORAL_TABLET | Freq: Every day | ORAL | Status: DC

## 2023-02-13 MED ORDER — ACETAMINOPHEN 325 MG PO TABS: 650.0000 mg | ORAL_TABLET | ORAL | Status: DC | PRN

## 2023-02-13 MED ORDER — INSULIN ASPART 100 UNIT/ML IJ SOLN: 0.0000 [IU] | Freq: Three times a day (TID) | INTRAMUSCULAR | Status: DC

## 2023-02-13 MED ORDER — ALPRAZOLAM 0.25 MG PO TABS: 0.2500 mg | ORAL_TABLET | Freq: Two times a day (BID) | ORAL | Status: DC | PRN

## 2023-02-13 MED ORDER — CARVEDILOL 3.125 MG PO TABS
6.2500 mg | ORAL_TABLET | Freq: Two times a day (BID) | ORAL | Status: DC
  Administered 2023-02-13 – 2023-02-14 (×2): 6.25 mg via ORAL
  Filled 2023-02-13 (×2): qty 2

## 2023-02-13 MED ORDER — VITAMIN B-12 1000 MCG PO TABS
1000.0000 ug | ORAL_TABLET | Freq: Once | ORAL | Status: DC
  Filled 2023-02-13: qty 1

## 2023-02-13 MED ORDER — ALUM & MAG HYDROXIDE-SIMETH 200-200-20 MG/5ML PO SUSP
30.0000 mL | Freq: Once | ORAL | Status: AC
  Administered 2023-02-13: 30 mL via ORAL
  Filled 2023-02-13: qty 30

## 2023-02-13 MED ORDER — ALLOPURINOL 100 MG PO TABS
100.0000 mg | ORAL_TABLET | Freq: Every day | ORAL | Status: DC
  Administered 2023-02-13 – 2023-02-14 (×2): 100 mg via ORAL
  Filled 2023-02-13: qty 1

## 2023-02-13 MED ORDER — ENOXAPARIN SODIUM 40 MG/0.4ML IJ SOSY
40.0000 mg | PREFILLED_SYRINGE | INTRAMUSCULAR | Status: DC
  Administered 2023-02-13: 40 mg via SUBCUTANEOUS
  Filled 2023-02-13: qty 0.4

## 2023-02-13 MED ORDER — LEVOTHYROXINE SODIUM 100 MCG PO TABS: 100.0000 ug | ORAL_TABLET | Freq: Every day | ORAL | Status: DC

## 2023-02-13 MED ORDER — EZETIMIBE 10 MG PO TABS
10.0000 mg | ORAL_TABLET | Freq: Every day | ORAL | Status: DC
  Administered 2023-02-13 – 2023-02-14 (×2): 10 mg via ORAL
  Filled 2023-02-13: qty 1

## 2023-02-13 MED ORDER — ALLOPURINOL 100 MG PO TABS
100.0000 mg | ORAL_TABLET | Freq: Once | ORAL | Status: DC
  Filled 2023-02-13: qty 1

## 2023-02-13 MED ORDER — ASPIRIN 81 MG PO TBEC: 81.0000 mg | DELAYED_RELEASE_TABLET | Freq: Once | ORAL | Status: DC

## 2023-02-13 MED ORDER — POTASSIUM CHLORIDE CRYS ER 20 MEQ PO TBCR
40.0000 meq | EXTENDED_RELEASE_TABLET | Freq: Once | ORAL | Status: AC
  Administered 2023-02-13: 40 meq via ORAL
  Filled 2023-02-13: qty 2

## 2023-02-13 MED ORDER — HYDROCHLOROTHIAZIDE 25 MG PO TABS
25.0000 mg | ORAL_TABLET | Freq: Every day | ORAL | Status: DC
  Administered 2023-02-14: 25 mg via ORAL
  Filled 2023-02-13: qty 1

## 2023-02-13 MED ORDER — PANTOPRAZOLE SODIUM 40 MG PO TBEC
80.0000 mg | DELAYED_RELEASE_TABLET | Freq: Every day | ORAL | Status: DC
  Administered 2023-02-13: 80 mg via ORAL
  Filled 2023-02-13: qty 2

## 2023-02-13 MED ORDER — LEVOTHYROXINE SODIUM 100 MCG PO TABS
100.0000 ug | ORAL_TABLET | Freq: Every day | ORAL | Status: DC
  Administered 2023-02-13: 100 ug via ORAL
  Filled 2023-02-13: qty 1

## 2023-02-13 MED ORDER — FUROSEMIDE 10 MG/ML IJ SOLN
40.0000 mg | Freq: Once | INTRAMUSCULAR | Status: AC
  Administered 2023-02-13: 40 mg via INTRAVENOUS
  Filled 2023-02-13: qty 4

## 2023-02-13 MED ORDER — ONDANSETRON HCL 4 MG/2ML IJ SOLN: 4.0000 mg | Freq: Four times a day (QID) | INTRAMUSCULAR | Status: DC | PRN

## 2023-02-13 MED ORDER — INSULIN ASPART 100 UNIT/ML IJ SOLN: 6.0000 [IU] | Freq: Three times a day (TID) | INTRAMUSCULAR | Status: DC

## 2023-02-13 MED ORDER — HYDROCHLOROTHIAZIDE 25 MG PO TABS: 25.0000 mg | ORAL_TABLET | Freq: Once | ORAL | Status: DC

## 2023-02-13 MED ORDER — BETAMETHASONE DIPROPIONATE 0.05 % EX LOTN: 1.0000 | TOPICAL_LOTION | Freq: Two times a day (BID) | CUTANEOUS | Status: DC

## 2023-02-13 MED ORDER — KETOCONAZOLE 2 % EX CREA
1.0000 | TOPICAL_CREAM | Freq: Every day | CUTANEOUS | Status: DC
  Administered 2023-02-13 – 2023-02-14 (×2): 1 via TOPICAL
  Filled 2023-02-13: qty 15

## 2023-02-13 MED ORDER — VALACYCLOVIR HCL 500 MG PO TABS
500.0000 mg | ORAL_TABLET | Freq: Two times a day (BID) | ORAL | Status: DC
  Administered 2023-02-13 – 2023-02-14 (×2): 500 mg via ORAL
  Filled 2023-02-13 (×2): qty 1

## 2023-02-13 MED ORDER — VITAMIN B-12 1000 MCG PO TABS
1000.0000 ug | ORAL_TABLET | Freq: Every day | ORAL | Status: DC
  Administered 2023-02-13: 1000 ug via ORAL

## 2023-02-13 MED ORDER — PANTOPRAZOLE SODIUM 40 MG PO TBEC
40.0000 mg | DELAYED_RELEASE_TABLET | Freq: Two times a day (BID) | ORAL | Status: DC
  Administered 2023-02-13 – 2023-02-14 (×2): 40 mg via ORAL
  Filled 2023-02-13 (×2): qty 1

## 2023-02-13 MED ORDER — TRIAMCINOLONE ACETONIDE 0.1 % EX LOTN
TOPICAL_LOTION | Freq: Two times a day (BID) | CUTANEOUS | Status: DC
  Administered 2023-02-13: 1 via TOPICAL
  Filled 2023-02-13: qty 60

## 2023-02-13 MED ORDER — ASPIRIN 81 MG PO TBEC
81.0000 mg | DELAYED_RELEASE_TABLET | Freq: Every day | ORAL | Status: DC
  Administered 2023-02-14: 81 mg via ORAL
  Filled 2023-02-13: qty 1

## 2023-02-13 MED ORDER — VITAMIN B-12 1000 MCG PO TABS
1000.0000 ug | ORAL_TABLET | Freq: Every day | ORAL | Status: DC
  Administered 2023-02-14: 1000 ug via ORAL
  Filled 2023-02-13: qty 1

## 2023-02-13 MED ORDER — ZOLPIDEM TARTRATE 5 MG PO TABS: 5.0000 mg | ORAL_TABLET | Freq: Every evening | ORAL | Status: DC | PRN

## 2023-02-13 MED ORDER — EZETIMIBE 10 MG PO TABS
10.0000 mg | ORAL_TABLET | Freq: Once | ORAL | Status: DC
  Filled 2023-02-13: qty 1

## 2023-02-13 MED ORDER — CARVEDILOL 3.125 MG PO TABS: 6.2500 mg | ORAL_TABLET | Freq: Two times a day (BID) | ORAL | Status: DC

## 2023-02-13 MED ORDER — INSULIN GLARGINE-YFGN 100 UNIT/ML ~~LOC~~ SOLN
30.0000 [IU] | Freq: Every day | SUBCUTANEOUS | Status: DC
  Administered 2023-02-13: 30 [IU] via SUBCUTANEOUS
  Filled 2023-02-13 (×2): qty 0.3

## 2023-02-13 NOTE — Progress Notes (Signed)
Pt otf. 

## 2023-02-13 NOTE — ED Triage Notes (Signed)
Pt via POV c/o central chest pain since 3am, woke him from sleeping, with intermittent radiation to left arm. Pt also feels nauseated. He took 4 chewable aspirin tablets at about 0730PTA. Hx prior MI with similar symptoms.

## 2023-02-13 NOTE — Hospital Course (Signed)
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  He says that this is a similar presentation that he experienced with a prior heart attack.  He felt nauseated.  He is having a lot of acid reflux symptoms.  He has been taking oral doxycycline twice daily for a week as empiric treatment for discovering multiple tick bites and multiple ticks found on body.  He has a history of [facility] spotted fever x 2 and reports that he is generally empirically treated with doxycycline for 1 month if he discovers ticks on his body.  He has been taking the antibiotic for approximately 1 week.  He said he took 4 chewable aspirin tablets at around 730 this morning.  His ED workup has been reassuring however given his significant cardiac history they have asked for observation admission and cardiology consultation, trending troponin and continuous telemetry monitoring.  Patient has been treated with a GI cocktail and reports that symptoms have improved but have not resolved completely.  He also reports diminished exercise tolerance and nonproductive cough that he is experienced over the last couple of days.

## 2023-02-13 NOTE — H&P (Addendum)
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  He says that this is a similar presentation that he experienced with a prior heart attack.  He felt nauseated.  He is having a lot of acid reflux symptoms.  He has been taking oral doxycycline twice daily for a week as empiric treatment for discovering multiple tick bites and multiple ticks found on body.  He has a history of [facility] spotted fever x 2 and reports that he is generally empirically treated with doxycycline for 1 month if he discovers ticks on his body.  He has been taking the antibiotic for approximately 1 week.  He said he took 4 chewable aspirin tablets at around 730 this morning.  His ED workup has been reassuring however given his significant cardiac history they have asked for observation admission and cardiology consultation, trending troponin and continuous telemetry monitoring.  Patient has been treated with a GI cocktail and reports that symptoms have improved but have not resolved completely.  He also reports diminished exercise tolerance and nonproductive cough that he is experienced over the last couple of days.    Past Medical History:  Diagnosis Date   Arthritis    hands   Coronary artery disease    Diabetes mellitus without  complication ([name])    GERD (gastroesophageal reflux disease)    H/O poliomyelitis    age 61.  now has "weak stomach muscles"   Hypertension    IBS (irritable bowel syndrome)    Iron deficiency anemia 09/22/2018   Myocardial infarct [name])    Renal insufficiency    Thyroid disease     Past Surgical History:  Procedure Laterality Date   BACK SURGERY  1967   BACK SURGERY  10/04/2005   benign tumor removal  2004   BIOPSY  11/18/2022   Procedure: BIOPSY;  Surgeon: Lanelle Bal, DO;  [facility];  Service: Endoscopy;;   COLONOSCOPY WITH PROPOFOL N/A 11/18/2022   Procedure: COLONOSCOPY WITH PROPOFOL;  Surgeon: Lanelle Bal, DO;  [facility]: [facility];  Service: Endoscopy;  Laterality: N/A;  8:30 am, pt did not want early appt. moved down (per PAT)   CORONARY ARTERY BYPASS GRAFT  2008   [facility] - 3 vessel   CORONARY ARTERY BYPASS GRAFT N/A 03/28/2022   Procedure: REDO CORONARY ARTERY BYPASS GRAFTING (CABG) X2 BYPASSES USING OPEN LEFT RADIAL ARTERY AND ENDOSCOPIC RIGHT GREATER SAPHENOUS VEIN HARVEST;  Surgeon: Loreli Slot, MD;  Location: [facility] OR;  Service: Open Heart Surgery;  Laterality: N/A;   CYSTOSCOPY WITH INSERTION OF UROLIFT     ESOPHAGOGASTRODUODENOSCOPY (EGD) WITH PROPOFOL N/A 11/18/2022   Procedure: ESOPHAGOGASTRODUODENOSCOPY (EGD) WITH PROPOFOL;  Surgeon: Lanelle Bal, DO;  [facility];  Service: Endoscopy;  Laterality: N/A;   HERNIA REPAIR Bilateral    inguinal   HYDROCELE EXCISION /  REPAIR     JOINT REPLACEMENT     left knee x2, rright x1   LEFT HEART CATH AND CORS/GRAFTS ANGIOGRAPHY N/A 03/25/2022   Procedure: LEFT HEART CATH AND CORS/GRAFTS ANGIOGRAPHY;  Surgeon: Corky Crafts, MD;  Location: [facility] INVASIVE CV LAB;  Service: Cardiovascular;  Laterality: N/A;   POLYPECTOMY  11/18/2022   Procedure: POLYPECTOMY;  Surgeon: Lanelle Bal, DO;  [facility];  Service: Endoscopy;;   PROSTATE SURGERY N/A 01/2021   TEE  WITHOUT CARDIOVERSION N/A 03/28/2022   Procedure: TRANSESOPHAGEAL ECHOCARDIOGRAM (TEE);  Surgeon: Loreli Slot, MD;  Location: [facility] OR;  Service: Open Heart Surgery;  Laterality: N/A;   TONSILLECTOMY       reports that he quit smoking about 26 years ago. His smoking use included cigarettes. He has a 38.00 pack-year smoking history. He has never used smokeless tobacco. He reports current alcohol use. He reports that he does not use drugs.  Allergies  Allergen Reactions   Myrbetriq [Mirabegron] Nausea And Vomiting   Jardiance [Empagliflozin]     Pt does not like this medication   Niacin And Related Itching   Nsaids     Kidney damage    Other Diarrhea and Nausea And Vomiting    PT does not want OPIOIDS   GLP-1 drugs - Nausea, vomiting, diarrhea, belching   Statins Itching    Family History  Problem Relation Age of Onset   Hypertension Mother    Heart attack Mother    Osteoarthritis Mother    Stroke Father    Hypertension Father    Stroke Sister    Stroke Sister    Colon cancer Neg Hx    Prostate cancer Neg Hx    Cancer - Lung Neg Hx     Prior to Admission medications   Medication Sig Start Date End Date Taking? Authorizing Provider  acetaminophen (TYLENOL) 650 MG CR tablet Take 1,300 mg by mouth every 8 (eight) hours as needed for pain.   Yes [provider]  allopurinol (ZYLOPRIM) 100 MG tablet Take 1 tablet (100 mg total) by mouth daily. 10/22/21  Yes Paseda, Baird Kay, Ryan Herrera  aspirin EC 81 MG tablet Take 81 mg by mouth daily.   Yes [provider]  betamethasone dipropionate 0.05 % lotion Apply 1 Application topically 2 (two) times daily. 01/27/23  Yes [provider]  Carboxymethylcellulose Sodium (THERATEARS PF OP) Place 1 drop into both eyes daily as needed (dry eyes).   Yes [provider]  carvedilol (COREG) 6.25 MG tablet Take 1 tablet (6.25 mg total) by mouth 2 (two) times daily. 10/28/22  Yes BranchAlben Spittle, MD   clotrimazole-betamethasone (LOTRISONE) cream Apply 1 Application topically 2 (two) times daily as needed (rash).   Yes [provider]  cyanocobalamin (VITAMIN B12) 1000 MCG tablet Take 1,000 mcg by mouth daily.   Yes [provider]  doxycycline (VIBRAMYCIN) 100 MG capsule Take 100 mg by mouth 2 (two) times daily. 02/03/23  Yes [provider]  Evolocumab (REPATHA SURECLICK) 140 MG/ML SOAJ INJECT 140 MG INTO THE SKIN EVERY 14 (FOURTEEN) DAYS. 10/14/22  Yes Maisie Fus, MD  ezetimibe (ZETIA) 10 MG tablet TAKE 1 TABLET BY MOUTH EVERY DAY 09/18/22  Yes Branch, Alben Spittle, MD  hydrochlorothiazide (HYDRODIURIL) 25 MG tablet TAKE 1 TABLET (25 MG TOTAL) BY MOUTH DAILY. 11/25/22  Yes BranchAlben Spittle, MD  insulin degludec (TRESIBA FLEXTOUCH) 100 UNIT/ML FlexTouch Pen Inject 35 Units into the skin at bedtime.  01/27/23  Yes Roma Kayser, MD  ketoconazole (NIZORAL) 2 % cream APPLY 1 APPLICATION TOPICALLY DAILY 02/03/23  Yes Ryan Herrera, Ryan Herrera  ketoconazole (NIZORAL) 2 % shampoo Apply 1 Application topically See admin instructions. Uses a small amount with every shower 11/01/18  Yes [provider]  levothyroxine (SYNTHROID) 100 MCG tablet TAKE 1 TABLET BY MOUTH EVERY DAY BEFORE BREAKFAST Patient taking differently: Take 100 mcg by mouth daily before breakfast. 12/16/22  Yes Ryan Herrera, Ryan Herrera  metFORMIN (GLUCOPHAGE) 1000 MG tablet TAKE 1 TABLET (1,000 MG TOTAL) BY MOUTH TWICE A DAY WITH FOOD Patient taking differently: Take 1,000 mg by mouth 2 (two) times daily with a meal. 01/23/23  Yes Nida, Denman George, MD  pantoprazole (PROTONIX) 40 MG tablet Take 1 tablet (40 mg total) by mouth daily. 04/08/22  Yes Paseda, Baird Kay, Ryan Herrera  tadalafil (CIALIS) 5 MG tablet Take 5 mg by mouth daily.   Yes [provider]  triamcinolone cream (KENALOG) 0.5 % Apply 1 Application topically 2 (two) times daily as needed (tick bites).   Yes [provider]  valACYclovir  (VALTREX) 500 MG tablet TAKE 1 TABLET BY MOUTH TWICE A DAY 08/05/22  Yes Ryan Herrera, Ryan Herrera  glucose blood test strip 1 each by Other route as needed. Use to check blood glucose twice daily as instructed 04/11/22   Roma Kayser, MD  Insulin Pen Needle (BD PEN NEEDLE NANO 2ND GEN) 32G X 4 MM MISC Use as directed to inject insulin daily 10/21/22   Roma Kayser, MD  UNABLE TO FIND Lift chair DX: [phone] 04/05/22   Donell Beers, Ryan Herrera  UNABLE TO FIND Blood pressure cuff DX: I10 05/02/22   Donell Beers, Ryan Herrera    Physical Exam: Vitals:   02/13/23 0830 02/13/23 0900 02/13/23 1000 02/13/23 1015  BP: (!) 150/70 (!) 153/70 103/83 (!) 144/73  Pulse: 62 60 (!) 59 (!) 57  Resp: 16 16 13 13   Temp:      TempSrc:      SpO2: 96% 98% 97% 97%  Weight:      Height:        Constitutional: elderly frail male, awake, alert, cooperative, NAD, calm, comfortable Eyes: PERRL, lids and conjunctivae normal ENMT: Mucous membranes are moist. Posterior pharynx clear of any exudate or lesions.Normal dentition.  Neck: normal, supple, no masses, no thyromegaly Respiratory: clear to auscultation bilaterally, no wheezing, no crackles. Normal respiratory effort. No accessory muscle use.  Cardiovascular: normal s1, s2 sounds, no murmurs / rubs / gallops. No extremity edema. 2+ pedal pulses. No carotid bruits.  Abdomen: no tenderness, no masses palpated. No hepatosplenomegaly. Bowel sounds positive.  Musculoskeletal: no clubbing / cyanosis. No joint deformity upper and lower extremities. Good ROM, no contractures. Normal muscle tone.  Skin: no rashes, lesions, ulcers. No induration Neurologic: CN 2-12 grossly intact. Sensation intact, DTR normal. Strength 5/5 in all 4.  Psychiatric: Normal judgment and insight. Alert and oriented x 3. Normal mood.   Labs on Admission: I have personally reviewed following labs and imaging studies  CBC: Recent Labs  Lab 02/13/23 0814  WBC 4.6  HGB 11.3*  HCT  34.8*  MCV 89.7  PLT 128*   Basic Metabolic Panel: Recent Labs  Lab 02/13/23 0814  NA 135  K 3.5  CL 101  CO2 26  GLUCOSE 88  BUN 14  CREATININE 0.89  CALCIUM 9.0   GFR: Estimated Creatinine Clearance: 72.4 mL/min (by C-G formula based on SCr of 0.89 mg/dL).  Liver Function Tests: No results for input(s): "AST", "ALT", "ALKPHOS", "BILITOT", "PROT", "ALBUMIN" in the last 168 hours. No results for input(s): "LIPASE", "AMYLASE" in the last 168 hours. No results for input(s): "AMMONIA" in the last 168 hours. Coagulation Profile: Recent Labs  Lab 02/13/23 0814  INR 0.9   Cardiac Enzymes: No results for input(s): "CKTOTAL", "CKMB", "CKMBINDEX", "TROPONINI" in the last 168 hours. BNP (last 3 results) No results for input(s): "PROBNP" in the last 8760 hours. HbA1C: No results for input(s): "HGBA1C" in the last 72 hours. CBG: No results for input(s): "GLUCAP" in the last 168 hours. Lipid Profile: No results for input(s): "CHOL", "HDL", "LDLCALC", "TRIG", "CHOLHDL", "LDLDIRECT" in the last 72 hours. Thyroid Function Tests: No results for input(s): "TSH", "T4TOTAL", "FREET4", "T3FREE", "THYROIDAB" in the last 72 hours. Anemia Panel: No results for input(s): "VITAMINB12", "FOLATE", "FERRITIN", "TIBC", "IRON", "RETICCTPCT" in the last 72 hours. Urine analysis:    Component Value Date/Time   COLORURINE YELLOW 06/11/2022 1840   APPEARANCEUR CLEAR 06/11/2022 1840   APPEARANCEUR CLEAR 04/29/2014 1442   LABSPEC 1.024 06/11/2022 1840   LABSPEC 1.015 04/29/2014 1442   PHURINE 5.0 06/11/2022 1840   GLUCOSEU >=500 (A) 06/11/2022 1840   GLUCOSEU NEGATIVE 04/29/2014 1442   HGBUR NEGATIVE 06/11/2022 1840   BILIRUBINUR NEGATIVE 06/11/2022 1840   BILIRUBINUR NEGATIVE 04/29/2014 1442   KETONESUR NEGATIVE 06/11/2022 1840   PROTEINUR NEGATIVE 06/11/2022 1840   NITRITE NEGATIVE 06/11/2022 1840   LEUKOCYTESUR NEGATIVE 06/11/2022 1840   LEUKOCYTESUR TRACE 04/29/2014 1442    Radiological  Exams on Admission: DG Chest Port 1 View  Result Date: 02/13/2023 CLINICAL DATA:  Shortness of breath.  Chest pain and nausea. EXAM: PORTABLE CHEST 1 VIEW COMPARISON:  Chest radiographs 07/17/2022 FINDINGS: Sequelae of CABG are again identified. The cardiomediastinal silhouette is within normal limits. There is mild chronic coarsening of the interstitial markings. No airspace consolidation, overt pulmonary edema, sizable pleural effusion, or pneumothorax is identified. IMPRESSION: No active disease. Electronically Signed   By: Sebastian Ache M.D.   On: 02/13/2023 08:30    EKG: Independently reviewed. NSR, no acute ST-T wave abnormalities   Assessment/Plan Principal Problem:   Chest pain with high risk for cardiac etiology Active Problems:   CAD (coronary artery disease), native coronary artery   Chronic low back pain   Diabetes mellitus type 2 without retinopathy ([name])   History of chronic kidney disease   Class 1 obesity due to excess calories with serious comorbidity and body mass index (BMI) of 31.0 to 31.9 in adult   Osteoarthritis, hand   Hypothyroidism   DM type 2 causing vascular disease ([name])   Vitamin B 12 deficiency   GERD (gastroesophageal reflux disease)   Mixed hyperlipidemia   S/P CABG x 2   H/O non-ST elevation myocardial infarction (NSTEMI)   Thrombocytopenia   Low HDL (under 40)   Chest Pain with high risk for cardiac etiology -reports symptoms similar to previous MI  -initial work up has been very reassuring -continue to trend HS troponins -continuous cardiac monitoring ordered -check 2D echocardiogram  -treat GERD aggressively -suspect symptoms may be exacerbated since starting doxycycline -will ask cardio team to see in AM   CAD s/p CABG x 2 -resume home cardiac medication -holding parameters added for carvedilol (given bradycardia) -telemetry bed requested  -follow up 2D echocardiogram   GERD -exacerbated by oral doxycycline  -GI cocktail  -pantoprazole  BID ordered -pepcid orally x 1 dose   Dyslipidemia -check fasting lipid panel in AM  -  resume home lipid lowering medication  -heart healthy diet  -glycemic control   Uncontrolled type 2 DM with vascular, renal, retinal complications  -as evidenced by A1c of 9.8%  -Pt recently has established care with Dr Fransico Him of [facility] -resume basal insulin, added prandial coverage and SSI coverage -frequent CBG monitoring ordered in [facility]   Hypothyroidism  -resume home levothyroxine -no plans to test TSH in the inpatient setting -follow up with Dr. Fransico Him outpatient at [facility]  Thrombocytopenia  -likely consumptive in setting of infection/antibiotics -recheck CBC in AM  -choosing enoxaparin instead of heparin for DVT prevention  DVT prophylaxis: enoxaparin  Code Status: Full   Family Communication: wife at bedside   Disposition Plan: anticipate DC home in 24 hours if remains stable  Consults called: cardiology   Admission status: OBS   Level of care: Telemetry Standley Dakins MD [facility] How to contact the [facility] Attending or Consulting provider 7A - 7P or covering provider during after hours 7P -7A, for this patient?  Check the care team in [facility] and look for a) attending/consulting [facility] provider listed and b) the [facility] team listed Log into [email or web address] and use [facility] universal password to access. If you do not have the password, please contact the [facility] operator. Locate the [facility] provider you are looking for under [facility] and page to a number that you can be directly reached. If you still have difficulty reaching the provider, please page the [facility] (Director on Call) for the Hospitalists listed on amion for assistance.   If 7PM-7AM, please contact night-coverage [email or web address] Password [email or web address]  02/13/2023, 10:40 AM

## 2023-02-13 NOTE — ED Notes (Signed)
Pt notes that he has been feeling very winded and SOB on exertion with intermittent cough x about a week. Provider notified.

## 2023-02-13 NOTE — ED Provider Notes (Signed)
77 year old male history of significant coronary disease with multiple different bypass grafts over time most recently in June 2023, on multiple different medications for heart disease as well as diabetes, takes a baby aspirin, presents with severe discomfort radiating from his epigastrium to his throat and a intense burning feeling, this woke him up at night, this is the same sensation he had with prior myocardial infarction which raised his red flag.  He states that is slightly better at this time but still present, he does not remember being short of breath but he was nauseated.  On exam the patient does have a soft heart murmur, regular rate and rhythm otherwise, clear lungs, no edema, no JVD.  He is in no distress and his EKG does not show any signs of acute ischemia.  Vitals reviewed showing some hypertension.  Will get labs troponin chest x-ray keep on the cardiac monitor and consult with cardiology.  Patient is very high risk but symptoms similar to prior MI, anticipate admission  D/w Dr. Laural Benes with [facility] who will admit  Medical screening examination/treatment/procedure(s) were conducted as a shared visit with non-physician practitioner(s) and myself.  I personally evaluated the patient during the encounter.  Clinical Impression:   Final diagnoses:  Unstable angina ([name])         Eber Hong, MD 02/13/23 1022

## 2023-02-13 NOTE — Progress Notes (Signed)
  Echocardiogram 2D Echocardiogram has been performed.  Ryan Herrera 02/13/2023, 3:05 PM

## 2023-02-13 NOTE — ED Provider Notes (Signed)
[place] EMERGENCY DEPARTMENT AT [facility] Provider Note   CSN: [id number] Arrival date & time: 02/13/23  7829     History  Chief Complaint  Patient presents with  . Chest Pain    Ryan Herrera is a 77 y.o. male with a history of NSTEMI, CABG x2, hypertension, and diabetes mellitus presents today for chest pain. Patient was woken up at 3 AM due to pain that starts in the stomach and radiates to his throat as well as some left arm discomfort. He reports taking 4 tablets of 81mg  Aspirin at home with some relief. Patient stated that his chest pain felt the same as it did when he had a heart attack one year ago. He reports new onset shortness of breath with activity which began several weeks ago and he also recently noticed bilateral lower extremity edema despite taking HCTZ.        Home Medications Prior to Admission medications   Medication Sig Start Date End Date Taking? Authorizing Provider  Probiotic Product (RESTORA) CAPS Take 1 capsule by mouth daily. 12/26/22   Aida Raider, NP  acetaminophen (TYLENOL) 650 MG CR tablet Take 1,300 mg by mouth every 8 (eight) hours as needed for pain.    [provider]  allopurinol (ZYLOPRIM) 100 MG tablet Take 1 tablet (100 mg total) by mouth daily. 10/22/21   Donell Beers, FNP  aspirin EC 81 MG tablet Take 81 mg by mouth daily.    [provider]  Carboxymethylcellulose Sodium (THERATEARS PF OP) Place 1 drop into both eyes daily as needed (dry eyes).    [provider]  carvedilol (COREG) 6.25 MG tablet Take 1 tablet (6.25 mg total) by mouth 2 (two) times daily. 10/28/22   Maisie Fus, MD  Cholecalciferol (VITAMIN D) 50 MCG (2000 UT) tablet Take 2,000 Units by mouth daily. Patient not taking: Reported on 12/24/2022    [provider]  clotrimazole-betamethasone (LOTRISONE) cream Apply 1 Application topically 2 (two) times daily as needed (rash).    [provider]   cyanocobalamin (VITAMIN B12) 1000 MCG tablet Take 1,000 mcg by mouth daily.    [provider]  Evolocumab (REPATHA SURECLICK) 140 MG/ML SOAJ INJECT 140 MG INTO THE SKIN EVERY 14 (FOURTEEN) DAYS. 10/14/22   Maisie Fus, MD  ezetimibe (ZETIA) 10 MG tablet TAKE 1 TABLET BY MOUTH EVERY DAY 09/18/22   Maisie Fus, MD  glucose blood test strip 1 each by Other route as needed. Use to check blood glucose twice daily as instructed 04/11/22   Roma Kayser, MD  hydrochlorothiazide (HYDRODIURIL) 25 MG tablet TAKE 1 TABLET (25 MG TOTAL) BY MOUTH DAILY. 11/25/22   Maisie Fus, MD  insulin degludec (TRESIBA FLEXTOUCH) 100 UNIT/ML FlexTouch Pen Inject 35 Units into the skin at bedtime. 01/27/23   Roma Kayser, MD  Insulin Pen Needle (BD PEN NEEDLE NANO 2ND GEN) 32G X 4 MM MISC Use as directed to inject insulin daily 10/21/22   Roma Kayser, MD  ketoconazole (NIZORAL) 2 % cream APPLY 1 APPLICATION TOPICALLY DAILY 02/03/23   Gilmore Laroche, FNP  ketoconazole (NIZORAL) 2 % shampoo Apply 1 Application topically See admin instructions. Uses a small amount with every shower 11/01/18   [provider]  levothyroxine (SYNTHROID) 100 MCG tablet TAKE 1 TABLET BY MOUTH EVERY DAY BEFORE BREAKFAST 12/16/22   Gilmore Laroche, FNP  metFORMIN (GLUCOPHAGE) 1000 MG tablet TAKE 1 TABLET (1,000 MG TOTAL) BY MOUTH  TWICE A DAY WITH FOOD 01/23/23   Roma Kayser, MD  pantoprazole (PROTONIX) 40 MG tablet Take 1 tablet (40 mg total) by mouth daily. 04/08/22   Donell Beers, FNP  Psyllium 48.57 % POWD 2-3 teaspoons daily in 8 oz of water. 12/24/22   Aida Raider, NP  saccharomyces boulardii (FLORASTOR) 250 MG capsule Take 1 capsule (250 mg total) by mouth 2 (two) times daily. 12/24/22   Aida Raider, NP  tadalafil (CIALIS) 5 MG tablet Take 5 mg by mouth daily.    [provider]  triamcinolone cream (KENALOG) 0.5 % Apply 1 Application topically 2 (two) times daily as  needed (tick bites).    [provider]  UNABLE TO FIND Lift chair DX: Z61.096 04/05/22   Donell Beers, FNP  UNABLE TO FIND Blood pressure cuff DX: I10 05/02/22   Paseda, Baird Kay, FNP  valACYclovir (VALTREX) 500 MG tablet TAKE 1 TABLET BY MOUTH TWICE A DAY 08/05/22   Gilmore Laroche, FNP      Allergies    Myrbetriq [mirabegron], Jardiance [empagliflozin], Niacin and related, Nsaids, Other, and Statins    Review of Systems   Review of Systems  HENT:  Positive for sore throat.   Respiratory:  Positive for cough.   Cardiovascular:  Positive for chest pain.  Gastrointestinal:  Positive for abdominal pain and nausea.  All other systems reviewed and are negative.   Physical Exam Updated Vital Signs BP (!) 172/88 (BP Location: Left Arm)   Pulse 63   Temp 97.6 F (36.4 C) (Oral)   Resp 14   Ht 5' 6.5" (1.689 m)   Wt 89.4 kg   SpO2 99%   BMI 31.32 kg/m  Physical Exam Vitals and nursing note reviewed.  Constitutional:      Appearance: Normal appearance.  HENT:     Head: Normocephalic and atraumatic.     Mouth/Throat:     Mouth: Mucous membranes are moist.  Eyes:     Conjunctiva/sclera: Conjunctivae normal.     Pupils: Pupils are equal, round, and reactive to light.  Cardiovascular:     Rate and Rhythm: Normal rate and regular rhythm.     Pulses: Normal pulses.     Heart sounds: Normal heart sounds.  Pulmonary:     Effort: Pulmonary effort is normal.     Breath sounds: Normal breath sounds.  Abdominal:     Palpations: Abdomen is soft.     Tenderness: There is abdominal tenderness.     Comments: Diffuse tenderness to palpation  Musculoskeletal:     Right lower leg: Edema present.     Left lower leg: Edema present.  Skin:    General: Skin is warm and dry.     Capillary Refill: Capillary refill takes less than 2 seconds.     Findings: No rash.  Neurological:     General: No focal deficit present.     Mental Status: He is alert.  Psychiatric:         Mood and Affect: Mood normal.        Behavior: Behavior normal.    ED Results / Procedures / Treatments   Labs (all labs ordered are listed, but only abnormal results are displayed) Labs Reviewed  BASIC METABOLIC PANEL  CBC  PROTIME-INR  TROPONIN I (HIGH SENSITIVITY)    EKG EKG Interpretation  Date/Time:  Thursday Feb 13 2023 08:04:07 EDT Ventricular Rate:  61 PR Interval:  287 QRS Duration: 105 QT Interval:  420 QTC Calculation: 423 R Axis:   12 Text Interpretation: Sinus rhythm Prolonged PR interval Confirmed by Eber Hong ([id number]) on 02/13/2023 8:18:16 AM  Radiology No results found.  Procedures Procedures    Medications Ordered in ED Medications  pantoprazole (PROTONIX) EC tablet 80 mg (has no administration in time range)    ED Course/ Medical Decision Making/ A&P                             Medical Decision Making Amount and/or Complexity of Data Reviewed Labs: ordered. Radiology: ordered.  Risk Prescription drug management.   This patient presents to the ED for concern of chest pain, this involves an extensive number of treatment options, and is a complaint that carries with it a high risk of complications and morbidity.  The differential diagnosis includes ACS, aortic dissection, pneumonia, pneumothorax, pulmonary embolism, GERD.   Co morbidities that complicate the patient evaluation  NSTEMI in 2023 CABG x2 Hypertension Diabetes Mellitus   Additional history obtained:  Additional history obtained from patient and his wife.   Lab Tests:  I Ordered, and personally interpreted labs.  The pertinent results include:  elevated BNP at 152. First troponin was 8, transferred to cardiology care before second reading obtained.   Imaging Studies ordered:  I ordered imaging studies including CXR.  I independently visualized and interpreted imaging which was unremarkable. I agree with the radiologist interpretation.   Cardiac Monitoring: /  EKG:  The patient was maintained on a cardiac monitor.  I personally viewed and interpreted the cardiac monitored which showed an underlying rhythm of: normal sinus rhythm with a prolonged QT interval.   Consultations Obtained:  I requested consultation with the cardiology,  and discussed lab and imaging findings as well as pertinent plan - they recommend: admission to cardiology for continued observation.   Problem List / ED Course / Critical interventions / Medication management  New onset chest pain with history of NSTEMI and CABG x2. I offered patient Nitroglycerin but he declined. I ordered medication including GI cocktail and Omeprazole for GI discomfort. Patient reports some relief but states the pain has localized to the center of his chest.    Test / Admission - Considered:  Due to patient's extensive cardiac history, cardiology is admitting patient for observation.             Final Clinical Impression(s) / ED Diagnoses Final diagnoses:  None    Rx / DC Orders ED Discharge Orders     None         Maxwell Marion, PA-C 02/18/23 1644    Eber Hong, MD 02/22/23 [phone]

## 2023-02-14 ENCOUNTER — Encounter (HOSPITAL_COMMUNITY): Payer: Self-pay | Admitting: Family Medicine

## 2023-02-14 DIAGNOSIS — I10 Essential (primary) hypertension: Secondary | ICD-10-CM

## 2023-02-14 DIAGNOSIS — I25119 Atherosclerotic heart disease of native coronary artery with unspecified angina pectoris: Secondary | ICD-10-CM | POA: Diagnosis not present

## 2023-02-14 DIAGNOSIS — K219 Gastro-esophageal reflux disease without esophagitis: Secondary | ICD-10-CM

## 2023-02-14 DIAGNOSIS — R0789 Other chest pain: Secondary | ICD-10-CM

## 2023-02-14 DIAGNOSIS — I2510 Atherosclerotic heart disease of native coronary artery without angina pectoris: Secondary | ICD-10-CM | POA: Diagnosis not present

## 2023-02-14 DIAGNOSIS — R079 Chest pain, unspecified: Secondary | ICD-10-CM | POA: Diagnosis not present

## 2023-02-14 DIAGNOSIS — Z951 Presence of aortocoronary bypass graft: Secondary | ICD-10-CM | POA: Diagnosis not present

## 2023-02-14 LAB — BASIC METABOLIC PANEL
Anion gap: 8 (ref 5–15)
BUN: 19 mg/dL (ref 8–23)
CO2: 28 mmol/L (ref 22–32)
Calcium: 8.9 mg/dL (ref 8.9–10.3)
Chloride: 100 mmol/L (ref 98–111)
Creatinine, Ser: 1.06 mg/dL (ref 0.61–1.24)
GFR, Estimated: >60 mL/min (ref >60)
Glucose, Bld: 136 mg/dL — ABNORMAL HIGH (ref 70–99)
Potassium: 3.5 mmol/L (ref 3.5–5.1)
Sodium: 136 mmol/L (ref 135–145)

## 2023-02-14 LAB — CBC
HCT: 35 % — ABNORMAL LOW (ref 39.0–52.0)
Hemoglobin: 11.3 g/dL — ABNORMAL LOW (ref 13.0–17.0)
MCH: 29.1 pg (ref 26.0–34.0)
MCHC: 32.3 g/dL (ref 30.0–36.0)
MCV: 90.2 fL (ref 80.0–100.0)
Platelets: 133 10*3/uL — ABNORMAL LOW (ref 150–400)
RBC: 3.88 MIL/uL — ABNORMAL LOW (ref 4.22–5.81)
RDW: 14.5 % (ref 11.5–15.5)
WBC: 4.3 10*3/uL (ref 4.0–10.5)
nRBC: 0 % (ref 0.0–0.2)

## 2023-02-14 LAB — GLUCOSE, CAPILLARY: Glucose-Capillary: 94 mg/dL (ref 70–99)

## 2023-02-14 LAB — LIPID PANEL
Cholesterol: 94 mg/dL (ref 0–200)
HDL: 29 mg/dL — ABNORMAL LOW (ref >40)
LDL Cholesterol: 47 mg/dL (ref 0–99)
Total CHOL/HDL Ratio: 3.2 RATIO
Triglycerides: 89 mg/dL (ref <150)
VLDL: 18 mg/dL (ref 0–40)

## 2023-02-14 LAB — MAGNESIUM: Magnesium: 1.9 mg/dL (ref 1.7–2.4)

## 2023-02-14 MED ORDER — DOXYCYCLINE MONOHYDRATE 100 MG PO CAPS: 100.0000 mg | ORAL_CAPSULE | Freq: Two times a day (BID) | ORAL | 0 refills | Status: AC

## 2023-02-14 MED ORDER — LEVOTHYROXINE SODIUM 100 MCG PO TABS
100.0000 ug | ORAL_TABLET | Freq: Every day | ORAL | Status: DC
  Administered 2023-02-14: 100 ug via ORAL
  Filled 2023-02-14: qty 1

## 2023-02-14 MED ORDER — PANTOPRAZOLE SODIUM 40 MG PO TBEC: 40.0000 mg | DELAYED_RELEASE_TABLET | Freq: Two times a day (BID) | ORAL | 3 refills | Status: DC
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
## 2023-02-14 NOTE — Discharge Instructions (Signed)
IMPORTANT INFORMATION: PAY CLOSE ATTENTION   PHYSICIAN DISCHARGE INSTRUCTIONS  Follow with Primary care provider  Zarwolo, Gloria, FNP  and other consultants as instructed by your Hospitalist Physician  SEEK MEDICAL CARE OR RETURN TO EMERGENCY ROOM IF SYMPTOMS COME BACK, WORSEN OR NEW PROBLEM DEVELOPS   Please note: You were cared for by a hospitalist during your hospital stay. Every effort will be made to forward records to your primary care provider.  You can request that your primary care provider send for your hospital records if they have not received them.  Once you are discharged, your primary care physician will handle any further medical issues. Please note that NO REFILLS for any discharge medications will be authorized once you are discharged, as it is imperative that you return to your primary care physician (or establish a relationship with a primary care physician if you do not have one) for your post hospital discharge needs so that they can reassess your need for medications and monitor your lab values.  Please get a complete blood count and chemistry panel checked by your Primary MD at your next visit, and again as instructed by your Primary MD.  Get Medicines reviewed and adjusted: Please take all your medications with you for your next visit with your Primary MD  Laboratory/radiological data: Please request your Primary MD to go over all hospital tests and procedure/radiological results at the follow up, please ask your primary care provider to get all Hospital records sent to his/her office.  In some cases, they will be blood work, cultures and biopsy results pending at the time of your discharge. Please request that your primary care provider follow up on these results.  If you are diabetic, please bring your blood sugar readings with you to your follow up appointment with primary care.    Please call and make your follow up appointments as soon as possible.    Also Note  the following: If you experience worsening of your admission symptoms, develop shortness of breath, life threatening emergency, suicidal or homicidal thoughts you must seek medical attention immediately by calling 911 or calling your MD immediately  if symptoms less severe.  You must read complete instructions/literature along with all the possible adverse reactions/side effects for all the Medicines you take and that have been prescribed to you. Take any new Medicines after you have completely understood and accpet all the possible adverse reactions/side effects.   Do not drive when taking Pain medications or sleeping medications (Benzodiazepines)  Do not take more than prescribed Pain, Sleep and Anxiety Medications. It is not advisable to combine anxiety,sleep and pain medications without talking with your primary care practitioner  Special Instructions: If you have smoked or chewed Tobacco  in the last 2 yrs please stop smoking, stop any regular Alcohol  and or any Recreational drug use.  Wear Seat belts while driving.  Do not drive if taking any narcotic, mind altering or controlled substances or recreational drugs or alcohol.       

## 2023-02-14 NOTE — Care Management Obs Status (Signed)
MEDICARE OBSERVATION STATUS NOTIFICATION   Patient Details  Name: Ryan Herrera MRN: [id number] Date of Birth: Nov 28, 1945   Medicare Observation Status Notification Given:  Yes    Corey Harold 02/14/2023, 9:48 AM

## 2023-02-14 NOTE — Discharge Summary (Signed)
Physician Discharge Summary  Ryan Herrera:[id number] DOB: [birth year] DOA: 02/13/2023  PCP: Gilmore Laroche, FNP Cardiology: Dr. Tereso Newcomer Admit date: 02/13/2023 Discharge date: 02/14/2023  Admitted From: Home  Disposition: Home   Recommendations for Outpatient Follow-up:  Follow up with PCP in 1 weeks Follow up with cardiology as scheduled  Please take pantoprazole twice daily while on antibiotic  Discharge Condition: STABLE   CODE STATUS: FULL DIET: heart healthy/carb modified    Brief Hospitalization Summary: Please see all hospital notes, images, labs for full details of the hospitalization. 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  He says that this is a similar presentation that he experienced with a prior heart attack.  He felt nauseated.  He is having a lot of acid reflux symptoms.  He has been taking oral doxycycline twice daily for a week as empiric treatment for discovering multiple tick bites and multiple ticks found on body.  He has a history of [facility] spotted fever x 2 and reports that he is generally empirically treated with doxycycline for 1 month if he discovers ticks on his body.  He has been taking the antibiotic for approximately 1 week.  He said he took 4 chewable aspirin tablets at around 730 this morning.  His ED workup has been reassuring however given his significant cardiac history they have asked for observation admission and cardiology consultation, trending troponin and continuous telemetry monitoring.  Patient has been treated with a GI cocktail and reports that symptoms have improved but have not  resolved completely.  He also reports diminished exercise tolerance and nonproductive cough that he is experienced over the last couple of days.  Hospital Course  Patient was admitted for observation chest pain rule out MI with serial high-sensitivity troponin tests that have all come back reassuring.  He also had a 2D echocardiogram which was unchanged from prior exam no ischemic findings of concern.  He was also on continuous telemetry monitoring which has remained stable.  He was seen by the inpatient cardiology service and they felt he could discharge home with outpatient follow-up with cardiology and no changes to his current cardiac medications.  His symptoms responded well to treatment for GERD symptoms with GI cocktail and increasing pantoprazole to twice daily.  He is currently on doxycycline prescribed by his primary care provider.  He requested the doxycycline monohydrate formulation that could be easier on his stomach with less GI side effects.  Patient to follow-up with PCP and cardiology as scheduled.  He is stable to discharge home today.   Discharge Diagnoses:  Principal Problem:   Chest pain with high risk for cardiac etiology Active Problems:   CAD (coronary artery disease), native coronary artery   Chronic low back pain   Diabetes mellitus type 2 without retinopathy ([name])   History of chronic kidney disease   Class 1 obesity due to excess calories with serious comorbidity and body mass index (BMI) of 31.0 to 31.9 in adult   Osteoarthritis, hand   Hypothyroidism   DM type 2 causing vascular disease ([name])   Vitamin B 12 deficiency   GERD (gastroesophageal reflux disease)   Mixed hyperlipidemia   S/P CABG  x 2   H/O non-ST elevation myocardial infarction (NSTEMI)   Thrombocytopenia   Low HDL (under 40)   Discharge Instructions:  Allergies as of 02/14/2023       Reactions   Myrbetriq [mirabegron] Nausea And Vomiting   Jardiance [empagliflozin]    Pt does not like this  medication   Niacin And Related Itching   Nsaids    Kidney damage    Other Diarrhea, Nausea And Vomiting   PT does not want OPIOIDS   GLP-1 drugs - Nausea, vomiting, diarrhea, belching   Statins Itching        Medication List     STOP taking these medications    doxycycline 100 MG capsule Commonly known as: VIBRAMYCIN       TAKE these medications    acetaminophen 650 MG CR tablet Commonly known as: TYLENOL Take 1,300 mg by mouth every 8 (eight) hours as needed for pain.   allopurinol 100 MG tablet Commonly known as: ZYLOPRIM Take 1 tablet (100 mg total) by mouth daily.   aspirin EC 81 MG tablet Take 81 mg by mouth daily.   BD Pen Needle Nano 2nd Gen 32G X 4 MM Misc Generic drug: Insulin Pen Needle Use as directed to inject insulin daily   betamethasone dipropionate 0.05 % lotion Apply 1 Application topically 2 (two) times daily.   carvedilol 6.25 MG tablet Commonly known as: COREG Take 1 tablet (6.25 mg total) by mouth 2 (two) times daily.   clotrimazole-betamethasone cream Commonly known as: LOTRISONE Apply 1 Application topically 2 (two) times daily as needed (rash).   cyanocobalamin 1000 MCG tablet Commonly known as: VITAMIN B12 Take 1,000 mcg by mouth daily.   doxycycline 100 MG capsule Commonly known as: MONODOX Take 1 capsule (100 mg total) by mouth 2 (two) times daily for 14 days.   ezetimibe 10 MG tablet Commonly known as: ZETIA TAKE 1 TABLET BY MOUTH EVERY DAY   glucose blood test strip 1 each by Other route as needed. Use to check blood glucose twice daily as instructed   hydrochlorothiazide 25 MG tablet Commonly known as: HYDRODIURIL TAKE 1 TABLET (25 MG TOTAL) BY MOUTH DAILY.   ketoconazole 2 % shampoo Commonly known as: NIZORAL Apply 1 Application topically See admin instructions. Uses a small amount with every shower   ketoconazole 2 % cream Commonly known as: NIZORAL APPLY 1 APPLICATION TOPICALLY DAILY   levothyroxine 100  MCG tablet Commonly known as: SYNTHROID TAKE 1 TABLET BY MOUTH EVERY DAY BEFORE BREAKFAST What changed: See the new instructions.   metFORMIN 1000 MG tablet Commonly known as: GLUCOPHAGE TAKE 1 TABLET (1,000 MG TOTAL) BY MOUTH TWICE A DAY WITH FOOD What changed:  how much to take how to take this when to take this additional instructions   pantoprazole 40 MG tablet Commonly known as: Protonix Take 1 tablet (40 mg total) by mouth 2 (two) times daily before a meal. What changed: when to take this   Repatha SureClick 140 MG/ML Soaj Generic drug: Evolocumab INJECT 140 MG INTO THE SKIN EVERY 14 (FOURTEEN) DAYS.   tadalafil 5 MG tablet Commonly known as: CIALIS Take 5 mg by mouth daily.   THERATEARS PF OP Place 1 drop into both eyes daily as needed (dry eyes).   Evaristo Bury FlexTouch 100 UNIT/ML FlexTouch Pen Generic drug: insulin degludec Inject 35 Units into the skin at bedtime.   triamcinolone cream 0.5 % Commonly known as: KENALOG Apply 1 Application topically 2 (two) times daily as  needed (tick bites).   UNABLE TO FIND Lift chair DX: M47.812   UNABLE TO FIND Blood pressure cuff DX: I10   valACYclovir 500 MG tablet Commonly known as: VALTREX TAKE 1 TABLET BY MOUTH TWICE A DAY        Follow-up Information     Maisie Fus, MD Follow up on 03/13/2023.   Specialty: Cardiology Why: Cardiology Hospital Follow-up on 03/13/2023 at 9:00 AM. Contact information: [address] [place] [address] [phone]         Gilmore Laroche, FNP. Schedule an appointment as soon as possible for a visit in 1 week(s).   Specialty: Family Medicine Why: Hospital Follow Up Contact information: [address] [place] [address] [phone]                Allergies  Allergen Reactions   Myrbetriq [Mirabegron] Nausea And Vomiting   Jardiance [Empagliflozin]     Pt does not like this medication   Niacin And Related Itching   Nsaids     Kidney  damage    Other Diarrhea and Nausea And Vomiting    PT does not want OPIOIDS   GLP-1 drugs - Nausea, vomiting, diarrhea, belching   Statins Itching   Allergies as of 02/14/2023       Reactions   Myrbetriq [mirabegron] Nausea And Vomiting   Jardiance [empagliflozin]    Pt does not like this medication   Niacin And Related Itching   Nsaids    Kidney damage    Other Diarrhea, Nausea And Vomiting   PT does not want OPIOIDS   GLP-1 drugs - Nausea, vomiting, diarrhea, belching   Statins Itching        Medication List     STOP taking these medications    doxycycline 100 MG capsule Commonly known as: VIBRAMYCIN       TAKE these medications    acetaminophen 650 MG CR tablet Commonly known as: TYLENOL Take 1,300 mg by mouth every 8 (eight) hours as needed for pain.   allopurinol 100 MG tablet Commonly known as: ZYLOPRIM Take 1 tablet (100 mg total) by mouth daily.   aspirin EC 81 MG tablet Take 81 mg by mouth daily.   BD Pen Needle Nano 2nd Gen 32G X 4 MM Misc Generic drug: Insulin Pen Needle Use as directed to inject insulin daily   betamethasone dipropionate 0.05 % lotion Apply 1 Application topically 2 (two) times daily.   carvedilol 6.25 MG tablet Commonly known as: COREG Take 1 tablet (6.25 mg total) by mouth 2 (two) times daily.   clotrimazole-betamethasone cream Commonly known as: LOTRISONE Apply 1 Application topically 2 (two) times daily as needed (rash).   cyanocobalamin 1000 MCG tablet Commonly known as: VITAMIN B12 Take 1,000 mcg by mouth daily.   doxycycline 100 MG capsule Commonly known as: MONODOX Take 1 capsule (100 mg total) by mouth 2 (two) times daily for 14 days.   ezetimibe 10 MG tablet Commonly known as: ZETIA TAKE 1 TABLET BY MOUTH EVERY DAY   glucose blood test strip 1 each by Other route as needed. Use to check blood glucose twice daily as instructed   hydrochlorothiazide 25 MG tablet Commonly known as: HYDRODIURIL TAKE 1  TABLET (25 MG TOTAL) BY MOUTH DAILY.   ketoconazole 2 % shampoo Commonly known as: NIZORAL Apply 1 Application topically See admin instructions. Uses a small amount with every shower   ketoconazole 2 % cream Commonly known as: NIZORAL  APPLY 1 APPLICATION TOPICALLY DAILY   levothyroxine 100 MCG tablet Commonly known as: SYNTHROID TAKE 1 TABLET BY MOUTH EVERY DAY BEFORE BREAKFAST What changed: See the new instructions.   metFORMIN 1000 MG tablet Commonly known as: GLUCOPHAGE TAKE 1 TABLET (1,000 MG TOTAL) BY MOUTH TWICE A DAY WITH FOOD What changed:  how much to take how to take this when to take this additional instructions   pantoprazole 40 MG tablet Commonly known as: Protonix Take 1 tablet (40 mg total) by mouth 2 (two) times daily before a meal. What changed: when to take this   Repatha SureClick 140 MG/ML Soaj Generic drug: Evolocumab INJECT 140 MG INTO THE SKIN EVERY 14 (FOURTEEN) DAYS.   tadalafil 5 MG tablet Commonly known as: CIALIS Take 5 mg by mouth daily.   THERATEARS PF OP Place 1 drop into both eyes daily as needed (dry eyes).   Evaristo Bury FlexTouch 100 UNIT/ML FlexTouch Pen Generic drug: insulin degludec Inject 35 Units into the skin at bedtime.   triamcinolone cream 0.5 % Commonly known as: KENALOG Apply 1 Application topically 2 (two) times daily as needed (tick bites).   UNABLE TO FIND Lift chair DX: M47.812   UNABLE TO FIND Blood pressure cuff DX: I10   valACYclovir 500 MG tablet Commonly known as: VALTREX TAKE 1 TABLET BY MOUTH TWICE A DAY        Procedures/Studies: ECHOCARDIOGRAM COMPLETE  Result Date: 02/13/2023    ECHOCARDIOGRAM REPORT   Patient Name:   Ryan Herrera Date of Exam: 02/13/2023 Medical Rec #:  [id number]            Height:       66.5 in Accession #:    [id number]           Weight:       193.3 lb Date of Birth:  1945/11/18            BSA:          1.982 m Patient Age:    76 years             BP:           150/76 mmHg  Patient Gender: M                    HR:           60 bpm. [facility] Procedure: 2D Echo, 3D Echo, Cardiac Doppler and Color Doppler Indications:    Chest Pain R07.9  History:        Patient has prior history of Echocardiogram examinations, most                 recent 03/28/2022. NSTEMI, CAD and Previous Myocardial                 Infarction, Prior CABG, Signs/Symptoms:Chest Pain; Risk                 Factors:Dyslipidemia, Diabetes, Former Smoker and Hypertension.  Sonographer:    Aron Baba Referring Phys: [id number] Cleora Fleet  Sonographer Comments: Image acquisition challenging due to respiratory motion. IMPRESSIONS  1. Left ventricular ejection fraction, by estimation, is 60 to 65%. The left ventricle has normal function. The left ventricle has no regional wall motion abnormalities. There is mild left ventricular hypertrophy. Left ventricular diastolic parameters are consistent with Grade II diastolic dysfunction (pseudonormalization).  2. Right ventricular systolic function is normal. The right ventricular size is normal. There  is normal pulmonary artery systolic pressure. The estimated right ventricular systolic pressure is 20.5 mmHg.  3. Left atrial size was moderately dilated.  4. The mitral valve is degenerative. Trivial mitral valve regurgitation.  5. The aortic valve is tricuspid. There is moderate calcification of the aortic valve. Aortic valve regurgitation is not visualized. Aortic valve sclerosis/calcification is present, without any evidence of aortic stenosis.  6. Aortic dilatation noted. There is mild dilatation of the aortic root, measuring 41 mm. There is mild dilatation of the ascending aorta, measuring 41 mm.  7. The inferior vena cava is normal in size with greater than 50% respiratory variability, suggesting right atrial pressure of 3 mmHg. Comparison(s): No significant change from prior study. Prior images reviewed side by side. FINDINGS  Left Ventricle: Left ventricular  ejection fraction, by estimation, is 60 to 65%. The left ventricle has normal function. The left ventricle has no regional wall motion abnormalities. Global longitudinal strain performed but not reported based on interpreter judgement due to suboptimal tracking. The left ventricular internal cavity size was normal in size. There is mild left ventricular hypertrophy. Left ventricular diastolic parameters are consistent with Grade II diastolic dysfunction (pseudonormalization). Right Ventricle: The right ventricular size is normal. No increase in right ventricular wall thickness. Right ventricular systolic function is normal. There is normal pulmonary artery systolic pressure. The tricuspid regurgitant velocity is 2.09 m/s, and  with an assumed right atrial pressure of 3 mmHg, the estimated right ventricular systolic pressure is 20.5 mmHg. Left Atrium: Left atrial size was moderately dilated. Right Atrium: Right atrial size was normal in size. Pericardium: Trivial pericardial effusion is present. The pericardial effusion is posterior to the left ventricle. Mitral Valve: The mitral valve is degenerative in appearance. There is mild thickening of the mitral valve leaflet(s). There is mild calcification of the mitral valve leaflet(s). Mild to moderate mitral annular calcification. Trivial mitral valve regurgitation. Tricuspid Valve: The tricuspid valve is grossly normal. Tricuspid valve regurgitation is mild. Aortic Valve: The aortic valve is tricuspid. There is moderate calcification of the aortic valve. Aortic valve regurgitation is not visualized. Aortic valve sclerosis/calcification is present, without any evidence of aortic stenosis. Pulmonic Valve: The pulmonic valve was grossly normal. Pulmonic valve regurgitation is trivial. Aorta: Aortic dilatation noted. There is mild dilatation of the aortic root, measuring 41 mm. There is mild dilatation of the ascending aorta, measuring 41 mm. Venous: The inferior vena cava  is normal in size with greater than 50% respiratory variability, suggesting right atrial pressure of 3 mmHg. IAS/Shunts: No atrial level shunt detected by color flow Doppler.  LEFT VENTRICLE PLAX 2D LVIDd:         4.50 cm   Diastology LVIDs:         2.90 cm   LV e' medial:    5.76 cm/s LV PW:         1.20 cm   LV E/e' medial:  14.9 LV IVS:        1.10 cm   LV e' lateral:   10.40 cm/s LVOT diam:     2.00 cm   LV E/e' lateral: 8.3 LV SV:         62 LV SV Index:   31 LVOT Area:     3.14 cm                           3D Volume EF:  3D EF:        50 %                          LV EDV:       158 ml                          LV ESV:       79 ml                          LV SV:        78 ml RIGHT VENTRICLE RV S prime:     7.42 cm/s TAPSE (M-mode): 1.0 cm LEFT ATRIUM             Index        RIGHT ATRIUM           Index LA diam:        4.30 cm 2.17 cm/m   RA Area:     12.40 cm LA Vol (A2C):   93.3 ml 47.07 ml/m  RA Volume:   23.60 ml  11.91 ml/m LA Vol (A4C):   93.6 ml 47.22 ml/m LA Biplane Vol: 97.0 ml 48.93 ml/m  AORTIC VALVE LVOT Vmax:   86.70 cm/s LVOT Vmean:  57.900 cm/s LVOT VTI:    0.197 m  AORTA Ao Root diam: 4.10 cm Ao Asc diam:  4.10 cm MITRAL VALVE               TRICUSPID VALVE MV Area (PHT): 2.45 cm    TR Peak grad:   17.5 mmHg MV Decel Time: 310 msec    TR Vmax:        209.00 cm/s MV E velocity: 85.90 cm/s MV A velocity: 67.50 cm/s  SHUNTS MV E/A ratio:  1.27        Systemic VTI:  0.20 m                            Systemic Diam: 2.00 cm Nona Dell MD Electronically signed by Nona Dell MD Signature Date/Time: 02/13/2023/3:33:22 PM    Final    DG Chest Port 1 View  Result Date: 02/13/2023 CLINICAL DATA:  Shortness of breath.  Chest pain and nausea. EXAM: PORTABLE CHEST 1 VIEW COMPARISON:  Chest radiographs 07/17/2022 FINDINGS: Sequelae of CABG are again identified. The cardiomediastinal silhouette is within normal limits. There is mild chronic coarsening of the interstitial  markings. No airspace consolidation, overt pulmonary edema, sizable pleural effusion, or pneumothorax is identified. IMPRESSION: No active disease. Electronically Signed   By: Sebastian Ache M.D.   On: 02/13/2023 08:30     Subjective: Pt reports symptoms resolved, eager to go home, GI cocktails have helped more than anything.    Discharge Exam: Vitals:   02/14/23 0739 02/14/23 0940  BP: (!) 146/78 (!) 158/69  Pulse: 62 67  Resp: 18   Temp: 98.1 F (36.7 C) 98.3 F (36.8 C)  SpO2: 94% 93%   Vitals:   02/13/23 2008 02/14/23 0004 02/14/23 0739 02/14/23 0940  BP:  135/72 (!) 146/78 (!) 158/69  Pulse:  60 62 67  Resp: 17 17 18    Temp:  98 F (36.7 C) 98.1 F (36.7 C) 98.3 F (36.8 C)  TempSrc:  Oral Oral Oral  SpO2:  97% 94% 93%  Weight:  Height:       General: Pt is alert, awake, not in acute distress Cardiovascular: normal S1/S2 +, no rubs, no gallops Respiratory: CTA bilaterally, no wheezing, no rhonchi Abdominal: Soft, NT, ND, bowel sounds + Extremities: no edema, no cyanosis   The results of significant diagnostics from this hospitalization (including imaging, microbiology, ancillary and laboratory) are listed below for reference.     Microbiology: No results found for this or any previous visit (from the past 240 hour(s)).   Labs: BNP (last 3 results) Recent Labs    07/17/22 1514 02/13/23 1011  BNP 64.2 154.0*   Basic Metabolic Panel: Recent Labs  Lab 02/13/23 0814 02/14/23 0425  NA 135 136  K 3.5 3.5  CL 101 100  CO2 26 28  GLUCOSE 88 136*  BUN 14 19  CREATININE 0.89 1.06  CALCIUM 9.0 8.9  MG  --  1.9   Liver Function Tests: No results for input(s): "AST", "ALT", "ALKPHOS", "BILITOT", "PROT", "ALBUMIN" in the last 168 hours. No results for input(s): "LIPASE", "AMYLASE" in the last 168 hours. No results for input(s): "AMMONIA" in the last 168 hours. CBC: Recent Labs  Lab 02/13/23 0814 02/14/23 0425  WBC 4.6 4.3  HGB 11.3* 11.3*  HCT 34.8*  35.0*  MCV 89.7 90.2  PLT 128* 133*   Cardiac Enzymes: No results for input(s): "CKTOTAL", "CKMB", "CKMBINDEX", "TROPONINI" in the last 168 hours. BNP: Invalid input(s): "POCBNP" CBG: Recent Labs  Lab 02/13/23 1155 02/13/23 1625 02/14/23 0737  GLUCAP 107* 123* 94   D-Dimer No results for input(s): "DDIMER" in the last 72 hours. Hgb A1c No results for input(s): "HGBA1C" in the last 72 hours. Lipid Profile Recent Labs    02/14/23 0425  CHOL 94  HDL 29*  LDLCALC 47  TRIG 89  CHOLHDL 3.2   Thyroid function studies No results for input(s): "TSH", "T4TOTAL", "T3FREE", "THYROIDAB" in the last 72 hours.  Invalid input(s): "FREET3" Anemia work up No results for input(s): "VITAMINB12", "FOLATE", "FERRITIN", "TIBC", "IRON", "RETICCTPCT" in the last 72 hours. Urinalysis    Component Value Date/Time   COLORURINE YELLOW 06/11/2022 1840   APPEARANCEUR CLEAR 06/11/2022 1840   APPEARANCEUR CLEAR 04/29/2014 1442   LABSPEC 1.024 06/11/2022 1840   LABSPEC 1.015 04/29/2014 1442   PHURINE 5.0 06/11/2022 1840   GLUCOSEU >=500 (A) 06/11/2022 1840   GLUCOSEU NEGATIVE 04/29/2014 1442   HGBUR NEGATIVE 06/11/2022 1840   BILIRUBINUR NEGATIVE 06/11/2022 1840   BILIRUBINUR NEGATIVE 04/29/2014 1442   KETONESUR NEGATIVE 06/11/2022 1840   PROTEINUR NEGATIVE 06/11/2022 1840   NITRITE NEGATIVE 06/11/2022 1840   LEUKOCYTESUR NEGATIVE 06/11/2022 1840   LEUKOCYTESUR TRACE 04/29/2014 1442   Sepsis Labs Recent Labs  Lab 02/13/23 0814 02/14/23 0425  WBC 4.6 4.3   Microbiology No results found for this or any previous visit (from the past 240 hour(s)).  Time coordinating discharge: 37 mins   SIGNED:  Standley Dakins, MD  [facility] 02/14/2023, 9:43 AM How to contact the [facility] Attending or Consulting provider 7A - 7P or covering provider during after hours 7P -7A, for this patient?  Check the care team in [facility] and look for a) attending/consulting [facility] provider listed and b) the [facility]  team listed Log into [email or web address] and use [place]'s universal password to access. If you do not have the password, please contact the hospital operator. Locate the [facility] provider you are looking for under [facility] and page to a number that you can be directly reached. If you still  have difficulty reaching the provider, please page the [facility] (Director on Call) for the Hospitalists listed on amion for assistance.

## 2023-02-14 NOTE — Progress Notes (Signed)
Patient discharged home with significant other via personal vehicle. Discharge education and instructions provided to patient. Verbalizes understanding of the information.

## 2023-02-14 NOTE — TOC Progression Note (Signed)
  Transition of Care [facility]) Screening Note   Patient Details  Name: Ryan Herrera Date of Birth: [birth year]   Transition of Care [facility]) CM/SW Contact:    Elliot Gault, LCSW Phone Number: 02/14/2023, 9:42 AM    Transition of Care Department [facility]) has reviewed patient and no TOC needs have been identified at this time. We will continue to monitor patient advancement through interdisciplinary progression rounds. If new patient transition needs arise, please place a TOC consult.

## 2023-02-17 ENCOUNTER — Telehealth: Payer: Self-pay

## 2023-02-17 NOTE — Transitions of Care (Post Inpatient/ED Visit) (Signed)
02/17/2023  Name: Ryan Herrera MRN: [id number] DOB: 1946/05/20  Today's TOC FU Call Status: Today's TOC FU Call Status:: Successful TOC FU Call Competed TOC FU Call Complete Date: 02/17/23  Transition Care Management Follow-up Telephone Call Date of Discharge: 02/14/23 [facility] Type of Discharge: Inpatient Admission Primary Inpatient Discharge Diagnosis:: angia How have you been since you were released from the hospital?: Better Any questions or concerns?: No  Items Reviewed: Did you receive and understand the discharge instructions provided?: Yes Medications obtained,verified, and reconciled?: Yes (Medications Reviewed) Any new allergies since your discharge?: No Dietary orders reviewed?: Yes  Medications Reviewed Today: Medications Reviewed Today     Reviewed by Karena Addison, LPN (Licensed Practical Nurse) on 02/17/23 at 1230  Med List Status: <None>   Medication Order Taking? Sig Documenting Provider Last Dose Status Informant  acetaminophen (TYLENOL) 650 MG CR tablet [id number] Yes Take 1,300 mg by mouth every 8 (eight) hours as needed for pain. [provider] Taking Active Self  allopurinol (ZYLOPRIM) 100 MG tablet [id number] Yes Take 1 tablet (100 mg total) by mouth daily. Donell Beers, FNP Taking Active Self  aspirin EC 81 MG tablet [id number] Yes Take 81 mg by mouth daily. [provider] Taking Active Self  betamethasone dipropionate 0.05 % lotion [id number] Yes Apply 1 Application topically 2 (two) times daily. [provider] Taking Active Self  Carboxymethylcellulose Sodium (THERATEARS PF OP) [id number] Yes Place 1 drop into both eyes daily as needed (dry eyes). [provider] Taking Active Self  carvedilol (COREG) 6.25 MG tablet [id number] Yes Take 1 tablet (6.25 mg total) by mouth 2 (two) times daily. Maisie Fus, MD Taking Active Self  clotrimazole-betamethasone Thurmond Butts) cream  [id number] Yes Apply 1 Application topically 2 (two) times daily as needed (rash). [provider] Taking Active Self  cyanocobalamin (VITAMIN B12) 1000 MCG tablet [id number] Yes Take 1,000 mcg by mouth daily. [provider] Taking Active Self  doxycycline (MONODOX) 100 MG capsule [id number] Yes Take 1 capsule (100 mg total) by mouth 2 (two) times daily for 14 days. Cleora Fleet, MD Taking Active   Evolocumab [facility] SURECLICK) 140 MG/ML Ivory Broad [id number] Yes INJECT 140 MG INTO THE SKIN EVERY 14 (FOURTEEN) DAYS. Maisie Fus, MD Taking Active Self           Med Note Joanne Gavel, CRYSTAL L   Tue Dec 24, 2022  3:21 PM) Q 15 days  ezetimibe (ZETIA) 10 MG tablet [id number] Yes TAKE 1 TABLET BY MOUTH EVERY DAY Maisie Fus, MD Taking Active Self  glucose blood test strip [id number] Yes 1 each by Other route as needed. Use to check blood glucose twice daily as instructed Roma Kayser, MD Taking Active Self  hydrochlorothiazide (HYDRODIURIL) 25 MG tablet [id number] Yes TAKE 1 TABLET (25 MG TOTAL) BY MOUTH DAILY. Maisie Fus, MD Taking Active Self  insulin degludec (TRESIBA FLEXTOUCH) 100 UNIT/ML FlexTouch Pen [id number] Yes Inject 35 Units into the skin at bedtime. Roma Kayser, MD Taking Active Self  Insulin Pen Needle (BD PEN NEEDLE NANO 2ND GEN) 32G X 4 MM MISC [id number] Yes Use as directed to inject insulin daily Roma Kayser, MD Taking Active Self  ketoconazole (NIZORAL) 2 % cream [id number] Yes APPLY 1 APPLICATION TOPICALLY DAILY Gilmore Laroche, FNP Taking Active Self  ketoconazole (NIZORAL) 2 % shampoo [id number] Yes Apply 1 Application topically See admin instructions. Uses a small amount with every shower [provider] Taking Active  Self  levothyroxine (SYNTHROID) 100 MCG tablet [id number] Yes TAKE 1 TABLET BY MOUTH EVERY DAY BEFORE BREAKFAST  Patient taking differently: Take 100 mcg by mouth daily before breakfast.   Gilmore Laroche, FNP  Taking Active Self  metFORMIN (GLUCOPHAGE) 1000 MG tablet [id number] Yes TAKE 1 TABLET (1,000 MG TOTAL) BY MOUTH TWICE A DAY WITH FOOD  Patient taking differently: Take 1,000 mg by mouth 2 (two) times daily with a meal.   Nida, Denman George, MD Taking Active Self  pantoprazole (PROTONIX) 40 MG tablet [id number] Yes Take 1 tablet (40 mg total) by mouth 2 (two) times daily before a meal. Johnson, Clanford L, MD Taking Active   tadalafil (CIALIS) 5 MG tablet [id number] Yes Take 5 mg by mouth daily. [provider] Taking Active Self           Med Note Mayford Knife, DAWN S   Tue Jun 11, 2022  7:51 PM)    triamcinolone cream (KENALOG) 0.5 % [id number] Yes Apply 1 Application topically 2 (two) times daily as needed (tick bites). [provider] Taking Active Self  UNABLE TO FIND [id number] Yes Lift chair DX: M47.812 Donell Beers, FNP Taking Active Self  UNABLE TO FIND [id number] Yes Blood pressure cuff DX: I10 Paseda, Baird Kay, FNP Taking Active Self  valACYclovir (VALTREX) 500 MG tablet [id number] Yes TAKE 1 TABLET BY MOUTH TWICE A Brynda Greathouse, FNP Taking Active Self            Home Care and Equipment/Supplies: Were Home Health Services Ordered?: NA Any new equipment or medical supplies ordered?: NA  Functional Questionnaire: Do you need assistance with bathing/showering or dressing?: No Do you need assistance with meal preparation?: No Do you need assistance with eating?: No Do you have difficulty maintaining continence: No Do you need assistance with getting out of bed/getting out of a chair/moving?: No Do you have difficulty managing or taking your medications?: No  Follow up appointments reviewed: PCP Follow-up appointment confirmed?: No (patient wants to see Dr Durwin Nora from now on. sent message to staff to schedule) MD Provider Line Number:[phone] Given: No Specialist Hospital Follow-up appointment confirmed?: No Reason Specialist Follow-Up Not  Confirmed: Patient has Specialist Provider Number and will Call for Appointment Do you need transportation to your follow-up appointment?: No Do you understand care options if your condition(s) worsen?: Yes-patient verbalized understanding    SIGNATURE Karena Addison, LPN [facility] Nurse Health Advisor Direct Dial [phone]

## 2023-02-18 NOTE — Telephone Encounter (Signed)
OK to switch PCP to me. FYI Yosseline & Belenda Cruise.

## 2023-02-18 NOTE — Telephone Encounter (Signed)
Okay to switch?  

## 2023-02-18 NOTE — Telephone Encounter (Signed)
Request needs to be sent to future PCP, not current PCP for approval.  Dr. Durwin Nora - this patient is requesting to establish care with an MD. Please review patient chart and determine if care can be managed by you.  Malachi Bonds - please disregard

## 2023-02-18 NOTE — Telephone Encounter (Signed)
Pt wants to switch pcp to [employer]. Wants a call back with the verdict. If does not answer please leave detailed message in regards

## 2023-02-24 ENCOUNTER — Ambulatory Visit (INDEPENDENT_AMBULATORY_CARE_PROVIDER_SITE_OTHER): Payer: Medicare HMO | Admitting: Internal Medicine

## 2023-02-24 ENCOUNTER — Encounter: Payer: Self-pay | Admitting: Internal Medicine

## 2023-02-24 VITALS — BP 135/81 | HR 68 | Ht 66.5 in | Wt 198.0 lb

## 2023-02-24 DIAGNOSIS — N4889 Other specified disorders of penis: Secondary | ICD-10-CM

## 2023-02-24 DIAGNOSIS — K219 Gastro-esophageal reflux disease without esophagitis: Secondary | ICD-10-CM

## 2023-02-24 DIAGNOSIS — Z09 Encounter for follow-up examination after completed treatment for conditions other than malignant neoplasm: Secondary | ICD-10-CM

## 2023-02-24 MED ORDER — PANTOPRAZOLE SODIUM 40 MG PO TBEC: 40.0000 mg | DELAYED_RELEASE_TABLET | Freq: Two times a day (BID) | ORAL | 0 refills | Status: DC

## 2023-02-24 MED ORDER — FLUTICASONE PROPIONATE 0.05 % EX CREA: TOPICAL_CREAM | Freq: Two times a day (BID) | CUTANEOUS | 0 refills | Status: DC | PRN

## 2023-02-24 NOTE — Progress Notes (Unsigned)
   HPI:[name] is a 77 y.o. male who presents for evaluation of Transitions Of Care (D/c 02/14/23) . Patient admitted for chest pain observation with reassuring evaluation by cardiology. He was treated for GERD and chest pain improved. He is symptom free since leaving hospital. For the details of today's visit, please refer to the assessment and plan.  Physical Exam: Vitals:   02/24/23 1116 02/24/23 1118  BP: (!) 146/74 135/81  Pulse: 68   SpO2: 93%   Weight: 198 lb 0.6 oz (89.8 kg)   Height: 5' 6.5" (1.689 m)      Physical Exam Constitutional:      General: He is not in acute distress.    Appearance: He is not ill-appearing.  Cardiovascular:     Rate and Rhythm: Normal rate and regular rhythm.  Pulmonary:     Effort: Pulmonary effort is normal. No respiratory distress.     Breath sounds: No wheezing.  Abdominal:     General: Bowel sounds are normal.     Tenderness: There is no abdominal tenderness. There is no guarding.      Assessment & Plan:   Airic was seen today for transitions of care.  Gastroesophageal reflux disease without esophagitis Assessment & Plan: GERD exacerbated by recent doxycycline , formulation changed and he was started on Protonix 40 mg BID Refilled Protonix Continue Protonix 40 mg BID, follow up with PCP in one month  Follow up with GI scheduled   Orders: -     Pantoprazole Sodium; Take 1 tablet (40 mg total) by mouth 2 (two) times daily before a meal.  Dispense: 180 tablet; Refill: 0  Penile irritation Assessment & Plan: Following with urology and review of urology notes show this was possibly secondary to Jardiance. Jardiance stopped and he was given betamethasone cream. Reports this did not help. He was prescribed fluticasone cream by another physician. Patient penile irriation has improved with prescription of fluticasone proprionate. Request refill today. Refilled and patient has follow up with urology scheduled    Other  orders -     Fluticasone Propionate; Apply topically 2 (two) times daily as needed.  Dispense: 30 g; Refill: 0      Milus Banister, MD

## 2023-02-24 NOTE — Patient Instructions (Addendum)
Thank you, [name] for allowing [place] to provide your care today.   You are no longer having pain in your chest. Protonix 40 mg twice daily sent to pharmacy. Continue to take this way until you follow up with your gastroenterologist.   Refilled Fluticasone.   Reminders: Followed up with PCP scheduled 6/10    Thurmon Fair, M.D.

## 2023-02-25 NOTE — Assessment & Plan Note (Signed)
GERD exacerbated by recent doxycycline , formulation changed and he was started on Protonix 40 mg BID Refilled Protonix Continue Protonix 40 mg BID, follow up with PCP in one month  Follow up with GI scheduled

## 2023-02-25 NOTE — Assessment & Plan Note (Signed)
Following with urology and review of urology notes show this was possibly secondary to Jardiance. Jardiance stopped and he was given betamethasone cream. Reports this did not help. He was prescribed fluticasone cream by another physician. Patient penile irriation has improved with prescription of fluticasone proprionate. Request refill today. Refilled and patient has follow up with urology scheduled

## 2023-03-13 ENCOUNTER — Encounter: Payer: Self-pay | Admitting: Internal Medicine

## 2023-03-13 ENCOUNTER — Ambulatory Visit: Payer: Medicare HMO | Attending: Internal Medicine | Admitting: Internal Medicine

## 2023-03-13 VITALS — BP 128/68 | HR 72 | Ht 66.5 in | Wt 197.6 lb

## 2023-03-13 DIAGNOSIS — I2510 Atherosclerotic heart disease of native coronary artery without angina pectoris: Secondary | ICD-10-CM | POA: Diagnosis not present
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
## 2023-03-13 NOTE — Patient Instructions (Signed)
Medication Instructions:  The current medical regimen is effective;  continue present plan and medications.  *If you need a refill on your cardiac medications before your next appointment, please call your pharmacy*   Follow-Up: At [facility], you and your health needs are our priority.  As part of our continuing mission to provide you with exceptional heart care, we have created designated Provider Care Teams.  These Care Teams include your primary Cardiologist (physician) and Advanced Practice Providers (APPs -  Physician Assistants and Nurse Practitioners) who all work together to provide you with the care you need, when you need it.  We recommend signing up for the patient portal called "MyChart".  Sign up information is provided on this After Visit Summary.  MyChart is used to connect with patients for Virtual Visits (Telemedicine).  Patients are able to view lab/test results, encounter notes, upcoming appointments, etc.  Non-urgent messages can be sent to your provider as well.   To learn more about what you can do with MyChart, go to [email or web address].    Your next appointment:   6 month(s)  Provider:   Branch, Mary E, MD      

## 2023-03-16 ENCOUNTER — Other Ambulatory Visit: Payer: Self-pay | Admitting: "Endocrinology

## 2023-03-16 DIAGNOSIS — E1159 Type 2 diabetes mellitus with other circulatory complications: Secondary | ICD-10-CM

## 2023-03-17 ENCOUNTER — Ambulatory Visit: Payer: Medicare HMO | Admitting: Family Medicine

## 2023-03-17 ENCOUNTER — Ambulatory Visit (INDEPENDENT_AMBULATORY_CARE_PROVIDER_SITE_OTHER): Payer: Medicare HMO | Admitting: Internal Medicine

## 2023-03-17 ENCOUNTER — Encounter: Payer: Self-pay | Admitting: Internal Medicine

## 2023-03-17 VITALS — BP 144/77 | HR 67 | Ht 66.75 in | Wt 197.0 lb

## 2023-03-17 DIAGNOSIS — E119 Type 2 diabetes mellitus without complications: Secondary | ICD-10-CM

## 2023-03-17 DIAGNOSIS — I10 Essential (primary) hypertension: Secondary | ICD-10-CM

## 2023-03-17 DIAGNOSIS — Z7984 Long term (current) use of oral hypoglycemic drugs: Secondary | ICD-10-CM

## 2023-03-17 DIAGNOSIS — K219 Gastro-esophageal reflux disease without esophagitis: Secondary | ICD-10-CM

## 2023-03-17 DIAGNOSIS — E785 Hyperlipidemia, unspecified: Secondary | ICD-10-CM

## 2023-03-17 DIAGNOSIS — I2510 Atherosclerotic heart disease of native coronary artery without angina pectoris: Secondary | ICD-10-CM | POA: Diagnosis not present

## 2023-03-17 MED ORDER — PANTOPRAZOLE SODIUM 40 MG PO TBEC: 40.0000 mg | DELAYED_RELEASE_TABLET | Freq: Every day | ORAL | 0 refills | Status: DC

## 2023-03-17 NOTE — Patient Instructions (Signed)
It was a pleasure to see you today.  Thank you for giving [place] the opportunity to be involved in your care.  Below is a brief recap of your visit and next steps.  We will plan to see you again in 3 months.  Summary No medication changes today Try reducing pantoprazole 40 mg to once daily Follow up in 3 months

## 2023-03-17 NOTE — Progress Notes (Unsigned)
Established Patient Office Visit  Subjective   Patient ID: Ryan Herrera, male    DOB: [birth year]  Age: 77 y.o. MRN: [id number]  Chief Complaint  Patient presents with   Diabetes    Follow up   Ryan Herrera returns to care today for routine follow-up.  Last evaluated at [facility] on 5/20 by Dr. Barbaraann Faster for hospital follow-up after overnight admission for chest pain observation with reassuring cardiac evaluation.  In the interim he was evaluated by cardiology (Dr. Wyline Mood) on 6/6.  No medication changes were made and 45-month follow-up was arranged.  Ryan Herrera reports feeling well today.  He is asymptomatic and has no acute concerns to discuss.  Past Medical History:  Diagnosis Date   Arthritis    hands   Coronary artery disease    a. s/p CABG in 2008 at [facility] b. re-do CABG at [facility] in 03/2022 with left RA-RI and SVG-PDA   Diabetes mellitus without complication ([name])    GERD (gastroesophageal reflux disease)    H/O poliomyelitis    age 67.  now has "weak stomach muscles"   Hypertension    IBS (irritable bowel syndrome)    Iron deficiency anemia 09/22/2018   Myocardial infarct [facility])    Renal insufficiency    Thyroid disease    Past Surgical History:  Procedure Laterality Date   BACK SURGERY  1967   BACK SURGERY  10/04/2005   benign tumor removal  2004   BIOPSY  11/18/2022   Procedure: BIOPSY;  Surgeon: Lanelle Bal, DO;  [facility];  Service: Endoscopy;;   COLONOSCOPY WITH PROPOFOL N/A 11/18/2022   Procedure: COLONOSCOPY WITH PROPOFOL;  Surgeon: Lanelle Bal, DO;  Location: [facility];  Service: Endoscopy;  Laterality: N/A;  8:30 am, pt did not want early appt. moved down (per PAT)   CORONARY ARTERY BYPASS GRAFT  2008   [facility] - 3 vessel   CORONARY ARTERY BYPASS GRAFT N/A 03/28/2022   Procedure: REDO CORONARY ARTERY BYPASS GRAFTING (CABG) X2 BYPASSES USING OPEN LEFT RADIAL ARTERY AND ENDOSCOPIC RIGHT GREATER SAPHENOUS VEIN HARVEST;  Surgeon:  Loreli Slot, MD;  Location: [facility] OR;  Service: Open Heart Surgery;  Laterality: N/A;   CYSTOSCOPY WITH INSERTION OF UROLIFT     ESOPHAGOGASTRODUODENOSCOPY (EGD) WITH PROPOFOL N/A 11/18/2022   Procedure: ESOPHAGOGASTRODUODENOSCOPY (EGD) WITH PROPOFOL;  Surgeon: Lanelle Bal, DO;  Location[facility];  Service: Endoscopy;  Laterality: N/A;   HERNIA REPAIR Bilateral    inguinal   HYDROCELE EXCISION / REPAIR     JOINT REPLACEMENT     left knee x2, rright x1   LEFT HEART CATH AND CORS/GRAFTS ANGIOGRAPHY N/A 03/25/2022   Procedure: LEFT HEART CATH AND CORS/GRAFTS ANGIOGRAPHY;  Surgeon: Corky Crafts, MD;  Location: [facility] INVASIVE CV LAB;  Service: Cardiovascular;  Laterality: N/A;   POLYPECTOMY  11/18/2022   Procedure: POLYPECTOMY;  Surgeon: Lanelle Bal, DO;  Location[facility];  Service: Endoscopy;;   PROSTATE SURGERY N/A 01/2021   TEE WITHOUT CARDIOVERSION N/A 03/28/2022   Procedure: TRANSESOPHAGEAL ECHOCARDIOGRAM (TEE);  Surgeon: Loreli Slot, MD;  Location: [facility] OR;  Service: Open Heart Surgery;  Laterality: N/A;   TONSILLECTOMY     Social History   Tobacco Use   Smoking status: Former    Packs/day: 1.00    Years: 38.00    Additional pack years: 0.00    Total pack years: 38.00    Types: Cigarettes    Quit  date: 12/1996    Years since quitting: 26.2   Smokeless tobacco: Never  Vaping Use   Vaping Use: Never used  Substance Use Topics   Alcohol use: Yes    Comment: occassionally   Drug use: No   Family History  Problem Relation Age of Onset   Hypertension Mother    Heart attack Mother    Osteoarthritis Mother    Stroke Father    Hypertension Father    Stroke Sister    Stroke Sister    Colon cancer Neg Hx    Prostate cancer Neg Hx    Cancer - Lung Neg Hx    Allergies  Allergen Reactions   Myrbetriq [Mirabegron] Nausea And Vomiting   Doxycycline Other (See Comments)   Jardiance [Empagliflozin]     Pt does not like this medication    Niacin And Related Itching   Nsaids     Kidney damage    Other Diarrhea and Nausea And Vomiting    PT does not want OPIOIDS   GLP-1 drugs - Nausea, vomiting, diarrhea, belching   Statins Itching   Review of Systems  Constitutional:  Negative for chills and fever.  HENT:  Negative for sore throat.   Respiratory:  Negative for cough and shortness of breath.   Cardiovascular:  Negative for chest pain, palpitations and leg swelling.  Gastrointestinal:  Negative for abdominal pain, blood in stool, constipation, diarrhea, nausea and vomiting.  Genitourinary:  Negative for dysuria and hematuria.  Musculoskeletal:  Negative for myalgias.  Skin:  Negative for itching and rash.  Neurological:  Negative for dizziness and headaches.  Psychiatric/Behavioral:  Negative for depression and suicidal ideas.      Objective:     BP (!) 144/77   Pulse 67   Ht 5' 6.75" (1.695 m)   Wt 197 lb (89.4 kg)   SpO2 94%   BMI 31.09 kg/m  BP Readings from Last 3 Encounters:  03/17/23 (!) 144/77  03/13/23 128/68  02/24/23 135/81   Physical Exam Vitals reviewed.  Constitutional:      General: He is not in acute distress.    Appearance: Normal appearance. He is obese. He is not ill-appearing.  HENT:     Head: Normocephalic and atraumatic.     Right Ear: External ear normal.     Left Ear: External ear normal.     Nose: Nose normal. No congestion or rhinorrhea.     Mouth/Throat:     Mouth: Mucous membranes are moist.     Pharynx: Oropharynx is clear.  Eyes:     General: No scleral icterus.    Extraocular Movements: Extraocular movements intact.     Conjunctiva/sclera: Conjunctivae normal.     Pupils: Pupils are equal, round, and reactive to light.  Cardiovascular:     Rate and Rhythm: Normal rate and regular rhythm.     Pulses: Normal pulses.     Heart sounds: Normal heart sounds. No murmur heard. Pulmonary:     Effort: Pulmonary effort is normal.     Breath sounds: Normal breath sounds. No  wheezing, rhonchi or rales.  Abdominal:     General: Abdomen is flat. Bowel sounds are normal. There is no distension.     Palpations: Abdomen is soft.     Tenderness: There is no abdominal tenderness.  Musculoskeletal:        General: No swelling or deformity. Normal range of motion.     Cervical back: Normal range of motion.  Skin:  General: Skin is warm and dry.     Capillary Refill: Capillary refill takes less than 2 seconds.  Neurological:     General: No focal deficit present.     Mental Status: He is alert and oriented to person, place, and time.     Motor: No weakness.  Psychiatric:        Mood and Affect: Mood normal.        Behavior: Behavior normal.        Thought Content: Thought content normal.   Last CBC Lab Results  Component Value Date   WBC 4.3 02/14/2023   HGB 11.3 (L) 02/14/2023   HCT 35.0 (L) 02/14/2023   MCV 90.2 02/14/2023   MCH 29.1 02/14/2023   RDW 14.5 02/14/2023   PLT 133 (L) 02/14/2023   Last metabolic panel Lab Results  Component Value Date   GLUCOSE 136 (H) 02/14/2023   NA 136 02/14/2023   K 3.5 02/14/2023   CL 100 02/14/2023   CO2 28 02/14/2023   BUN 19 02/14/2023   CREATININE 1.06 02/14/2023   GFRNONAA >60 02/14/2023   CALCIUM 8.9 02/14/2023   PROT 6.9 01/22/2023   ALBUMIN 4.0 01/22/2023   LABGLOB 2.9 01/22/2023   AGRATIO 1.4 01/22/2023   BILITOT 0.3 01/22/2023   ALKPHOS 94 01/22/2023   AST 22 01/22/2023   ALT 24 01/22/2023   ANIONGAP 8 02/14/2023   Last lipids Lab Results  Component Value Date   CHOL 94 02/14/2023   HDL 29 (L) 02/14/2023   LDLCALC 47 02/14/2023   TRIG 89 02/14/2023   CHOLHDL 3.2 02/14/2023   Last hemoglobin A1c Lab Results  Component Value Date   HGBA1C 9.3 (H) 01/22/2023   Last thyroid functions Lab Results  Component Value Date   TSH 3.950 01/22/2023   T3TOTAL 74 03/24/2022   Last vitamin D Lab Results  Component Value Date   VD25OH 40.5 01/22/2023   Last vitamin B12 and Folate Lab  Results  Component Value Date   VITAMINB12 573 09/22/2018   FOLATE 16.6 09/22/2018     Assessment & Plan:   Problem List Items Addressed This Visit       CAD (coronary artery disease), native coronary artery    History of CAD s/p CABG x 3 at [facility] in 2008.  Underwent redo CABG in June 2023.  Followed by cardiac (Dr. Wyline Mood).  Recent overnight admission for observation in the setting of chest pain.  Cardiac workup was reassuring.  He is currently prescribed ASA 81 mg daily, carvedilol, and Repatha/ezetimibe.  Denies chest pain currently. -No medication changes today      Essential hypertension, benign    BP is mildly elevated today at 144/77, however he routinely checks his blood pressure at home and readings are well-controlled.  His current antihypertensive regimen consists of carvedilol 6.25 mg twice daily, and HCTZ 25 mg daily. -No medication changes today.  Consider addition of ACE/ARB therapy at follow-up for renal protection in the setting of diabetes.      GERD (gastroesophageal reflux disease)    Symptoms are currently well-controlled with Protonix 40 mg twice daily.  He believes symptoms would remain well-controlled if only taking Protonix once daily. -Reduce Protonix to 40 mg daily      Diabetes mellitus type 2 without retinopathy ([name]) - Primary    Followed by endocrinology (Dr. Fransico Him).  Last A1c 9.3.  He is currently prescribed Tresiba, glipizide, and metformin. -No medication change today.  Endocrinology follow-up is scheduled for  August.      Dyslipidemia    History of statin intolerance.  He is currently prescribed ezetimibe 10 mg daily and Repatha 140 mg every 14 days.  Lipid panel updated last month.  Total cholesterol 94 and LDL 47. -No medication changes today      Return in about 3 months (around 06/17/2023).   Billie Lade, MD

## 2023-03-18 NOTE — Assessment & Plan Note (Signed)
Followed by endocrinology (Dr. Fransico Him).  Last A1c 9.3.  He is currently prescribed Tresiba, glipizide, and metformin. -No medication change today.  Endocrinology follow-up is scheduled for August.

## 2023-03-18 NOTE — Assessment & Plan Note (Signed)
History of statin intolerance.  He is currently prescribed ezetimibe 10 mg daily and Repatha 140 mg every 14 days.  Lipid panel updated last month.  Total cholesterol 94 and LDL 47. -No medication changes today

## 2023-03-18 NOTE — Assessment & Plan Note (Signed)
Symptoms are currently well-controlled with Protonix 40 mg twice daily.  He believes symptoms would remain well-controlled if only taking Protonix once daily. -Reduce Protonix to 40 mg daily

## 2023-03-18 NOTE — Assessment & Plan Note (Signed)
History of CAD s/p CABG x 3 at [facility] in 2008.  Underwent redo CABG in June 2023.  Followed by cardiac (Dr. Wyline Mood).  Recent overnight admission for observation in the setting of chest pain.  Cardiac workup was reassuring.  He is currently prescribed ASA 81 mg daily, carvedilol, and Repatha/ezetimibe.  Denies chest pain currently. -No medication changes today

## 2023-03-18 NOTE — Assessment & Plan Note (Signed)
BP is mildly elevated today at 144/77, however he routinely checks his blood pressure at home and readings are well-controlled.  His current antihypertensive regimen consists of carvedilol 6.25 mg twice daily, and HCTZ 25 mg daily. -No medication changes today.  Consider addition of ACE/ARB therapy at follow-up for renal protection in the setting of diabetes.

## 2023-03-21 ENCOUNTER — Other Ambulatory Visit: Payer: Self-pay | Admitting: Family Medicine

## 2023-03-21 DIAGNOSIS — E039 Hypothyroidism, unspecified: Secondary | ICD-10-CM

## 2023-03-25 ENCOUNTER — Ambulatory Visit: Payer: Medicare HMO | Admitting: Gastroenterology

## 2023-03-29 ENCOUNTER — Other Ambulatory Visit: Payer: Self-pay | Admitting: Internal Medicine

## 2023-04-22 DIAGNOSIS — E1140 Type 2 diabetes mellitus with diabetic neuropathy, unspecified: Secondary | ICD-10-CM | POA: Diagnosis not present

## 2023-04-22 DIAGNOSIS — I10 Essential (primary) hypertension: Secondary | ICD-10-CM | POA: Diagnosis not present

## 2023-04-22 DIAGNOSIS — I2510 Atherosclerotic heart disease of native coronary artery without angina pectoris: Secondary | ICD-10-CM | POA: Diagnosis not present

## 2023-04-22 DIAGNOSIS — N4889 Other specified disorders of penis: Secondary | ICD-10-CM | POA: Diagnosis not present

## 2023-04-22 DIAGNOSIS — N35014 Post-traumatic urethral stricture, male, unspecified: Secondary | ICD-10-CM | POA: Diagnosis not present

## 2023-04-22 DIAGNOSIS — Z794 Long term (current) use of insulin: Secondary | ICD-10-CM | POA: Diagnosis not present

## 2023-04-22 DIAGNOSIS — N35811 Other urethral stricture, male, meatal: Secondary | ICD-10-CM | POA: Diagnosis not present

## 2023-04-22 DIAGNOSIS — E039 Hypothyroidism, unspecified: Secondary | ICD-10-CM | POA: Diagnosis not present

## 2023-04-22 DIAGNOSIS — K219 Gastro-esophageal reflux disease without esophagitis: Secondary | ICD-10-CM | POA: Diagnosis not present

## 2023-04-22 DIAGNOSIS — E119 Type 2 diabetes mellitus without complications: Secondary | ICD-10-CM | POA: Diagnosis not present

## 2023-04-22 DIAGNOSIS — N4829 Other inflammatory disorders of penis: Secondary | ICD-10-CM | POA: Diagnosis not present

## 2023-04-22 DIAGNOSIS — E7800 Pure hypercholesterolemia, unspecified: Secondary | ICD-10-CM | POA: Diagnosis not present

## 2023-04-22 DIAGNOSIS — N35919 Unspecified urethral stricture, male, unspecified site: Secondary | ICD-10-CM | POA: Diagnosis not present

## 2023-04-22 DIAGNOSIS — N35010 Post-traumatic urethral stricture, male, meatal: Secondary | ICD-10-CM | POA: Diagnosis not present

## 2023-04-23 ENCOUNTER — Other Ambulatory Visit: Payer: Self-pay | Admitting: *Deleted

## 2023-04-23 ENCOUNTER — Ambulatory Visit (INDEPENDENT_AMBULATORY_CARE_PROVIDER_SITE_OTHER): Payer: Medicare HMO | Admitting: Internal Medicine

## 2023-04-23 ENCOUNTER — Encounter: Payer: Self-pay | Admitting: Internal Medicine

## 2023-04-23 ENCOUNTER — Encounter: Payer: Self-pay | Admitting: *Deleted

## 2023-04-23 VITALS — BP 129/63 | HR 63 | Temp 97.6°F | Ht 66.75 in | Wt 197.8 lb

## 2023-04-23 DIAGNOSIS — K219 Gastro-esophageal reflux disease without esophagitis: Secondary | ICD-10-CM | POA: Diagnosis not present

## 2023-04-23 DIAGNOSIS — R197 Diarrhea, unspecified: Secondary | ICD-10-CM

## 2023-04-23 DIAGNOSIS — R194 Change in bowel habit: Secondary | ICD-10-CM | POA: Diagnosis not present

## 2023-04-23 DIAGNOSIS — D649 Anemia, unspecified: Secondary | ICD-10-CM

## 2023-04-23 NOTE — Patient Instructions (Signed)
For your chronic IBS/diarrhea want to start taking half tablet of Imodium daily.  If you do not get the results you want that I would increase to a full tablet daily.  I am going to order an ultrasound of your liver and gallbladder.  We will call with results.  Follow-up in 3 months.  It was very nice seeing you again today.  Dr. Marletta Lor

## 2023-04-23 NOTE — Progress Notes (Signed)
Referring Provider: Gilmore Laroche, FNP Primary Care Physician:  Billie Lade, MD Primary GI:  Dr. Marletta Lor  Chief Complaint  Patient presents with   Diarrhea    Follow up on diarrhea. Has diarrhea almost daily.     HPI:   Ryan Herrera is a 77 y.o. male who presents to clinic today for fu visit.   Diarrhea, change in bowel habits: Used to be constipated if he did not have 1 BM within time frame of feeling need to defecate then for about the last year or more he has been experiencing multiple stools per day starting out normal and then having repeated episodes of diarrhea. He has had bloating associated with this and also experience urgency as well. Unable to identify any specific triggers. Colon biopsies negative for microscopic colitis. Thyroid function has been normal. No melena or brbpr.  Probiotics did not help. Previously told to try imodium but he has not tried this.      GERD: Controlled with pantoprazole 40 mg once daily.    Anemia: Chronic. Has not been able to tolerate oral iron in the past. Hgb 11.6 with normocytic indices in December 2023. EGD and Colonoscopy up to date. EGD with gastritis, questionable varix. Biopsies without any significance. Colonoscopy with hemorrhoids and multiple tubular adenomas.   EGD 11/18/2022: -Gastritis s/p biopsy -Normal duodenum -Mucosal changes characterized by isolated varix, question of possible nodule found in the middle third of the esophagus -No significant pathology from gastric biopsy, negative for H. Pylori   Colonoscopy 11/2022: -Nonbleeding internal hemorrhoids -Sigmoid diverticulosis -2 polyps in the ascending colon -3 polyps in the sigmoid and descending colon -Random biopsies taken to assess for microscopic colitis -Polyps revealed to be tubular adenomas -No repeat colonoscopy due to age Past Medical History:  Diagnosis Date   Arthritis    hands   Coronary artery disease    a. s/p CABG in 2008 at [facility] b.  re-do CABG at [facility] in 03/2022 with left RA-RI and SVG-PDA   Diabetes mellitus without complication ([name])    GERD (gastroesophageal reflux disease)    H/O poliomyelitis    age 1.  now has "weak stomach muscles"   Hypertension    IBS (irritable bowel syndrome)    Iron deficiency anemia 09/22/2018   Myocardial infarct [facility])    Renal insufficiency    Thyroid disease     Past Surgical History:  Procedure Laterality Date   BACK SURGERY  1967   BACK SURGERY  10/04/2005   benign tumor removal  2004   BIOPSY  11/18/2022   Procedure: BIOPSY;  Surgeon: Lanelle Bal, DO;  Location: [facility];  Service: Endoscopy;;   COLONOSCOPY WITH PROPOFOL N/A 11/18/2022   Procedure: COLONOSCOPY WITH PROPOFOL;  Surgeon: Lanelle Bal, DO;  Location: [facility];  Service: Endoscopy;  Laterality: N/A;  8:30 am, pt did not want early appt. moved down (per PAT)   CORONARY ARTERY BYPASS GRAFT  2008   [facility] - 3 vessel   CORONARY ARTERY BYPASS GRAFT N/A 03/28/2022   Procedure: REDO CORONARY ARTERY BYPASS GRAFTING (CABG) X2 BYPASSES USING OPEN LEFT RADIAL ARTERY AND ENDOSCOPIC RIGHT GREATER SAPHENOUS VEIN HARVEST;  Surgeon: Loreli Slot, MD;  Location: [facility] OR;  Service: Open Heart Surgery;  Laterality: N/A;   CYSTOSCOPY WITH INSERTION OF UROLIFT     ESOPHAGOGASTRODUODENOSCOPY (EGD) WITH PROPOFOL N/A 11/18/2022   Procedure: ESOPHAGOGASTRODUODENOSCOPY (EGD) WITH PROPOFOL;  Surgeon: Lanelle Bal, DO;  Location:  [facility];  Service: Endoscopy;  Laterality: N/A;   HERNIA REPAIR Bilateral    inguinal   HYDROCELE EXCISION / REPAIR     JOINT REPLACEMENT     left knee x2, rright x1   LEFT HEART CATH AND CORS/GRAFTS ANGIOGRAPHY N/A 03/25/2022   Procedure: LEFT HEART CATH AND CORS/GRAFTS ANGIOGRAPHY;  Surgeon: Corky Crafts, MD;  Location: [facility] INVASIVE CV LAB;  Service: Cardiovascular;  Laterality: N/A;   POLYPECTOMY  11/18/2022   Procedure: POLYPECTOMY;  Surgeon: Lanelle Bal,  DO;  Location: [facility];  Service: Endoscopy;;   PROSTATE SURGERY N/A 01/2021   TEE WITHOUT CARDIOVERSION N/A 03/28/2022   Procedure: TRANSESOPHAGEAL ECHOCARDIOGRAM (TEE);  Surgeon: Loreli Slot, MD;  Location: [facility] OR;  Service: Open Heart Surgery;  Laterality: N/A;   TONSILLECTOMY      Current Outpatient Medications  Medication Sig Dispense Refill   acetaminophen (TYLENOL) 650 MG CR tablet Take 1,300 mg by mouth every 8 (eight) hours as needed for pain.     allopurinol (ZYLOPRIM) 100 MG tablet Take 1 tablet (100 mg total) by mouth daily. 30 tablet 3   amoxicillin (AMOXIL) 500 MG capsule Take 500 mg by mouth. 4 capsules before dental work     aspirin EC 81 MG tablet Take 81 mg by mouth daily.     Carboxymethylcellulose Sodium (THERATEARS PF OP) Place 1 drop into both eyes daily as needed (dry eyes).     carvedilol (COREG) 6.25 MG tablet Take 1 tablet (6.25 mg total) by mouth 2 (two) times daily. 180 tablet 3   Cholecalciferol (D3 2000) 50 MCG (2000 UT) CAPS Take by mouth. One daily     clotrimazole-betamethasone (LOTRISONE) cream Apply 1 Application topically 2 (two) times daily as needed (rash).     cyanocobalamin (VITAMIN B12) 1000 MCG tablet Take 1,000 mcg by mouth daily.     Evolocumab (REPATHA SURECLICK) 140 MG/ML SOAJ INJECT 140 MG INTO THE SKIN EVERY 14 (FOURTEEN) DAYS. 2 mL 11   ezetimibe (ZETIA) 10 MG tablet TAKE 1 TABLET BY MOUTH EVERY DAY 90 tablet 3   glipiZIDE (GLUCOTROL XL) 5 MG 24 hr tablet Take 5 mg by mouth daily with breakfast.     glucose blood test strip 1 each by Other route as needed. Use to check blood glucose twice daily as instructed 100 each 2   hydrochlorothiazide (HYDRODIURIL) 25 MG tablet TAKE 1 TABLET (25 MG TOTAL) BY MOUTH DAILY. 90 tablet 1   Insulin Degludec (TRESIBA [place]) Inject into the skin. 35 units bedtime     Insulin Pen Needle (BD PEN NEEDLE NANO 2ND GEN) 32G X 4 MM MISC Use as directed to inject insulin daily 100 each 2   ketoconazole  (NIZORAL) 2 % cream APPLY 1 APPLICATION TOPICALLY DAILY 15 g 3   ketoconazole (NIZORAL) 2 % shampoo Apply 1 Application topically See admin instructions. Uses a small amount with every shower     levothyroxine (SYNTHROID) 100 MCG tablet TAKE 1 TABLET BY MOUTH EVERY DAY BEFORE BREAKFAST 90 tablet 0   metFORMIN (GLUCOPHAGE) 1000 MG tablet TAKE 1 TABLET (1,000 MG TOTAL) BY MOUTH TWICE A DAY WITH FOOD (Patient taking differently: Take 1,000 mg by mouth 2 (two) times daily with a meal.) 180 tablet 1   pantoprazole (PROTONIX) 40 MG tablet Take 1 tablet (40 mg total) by mouth daily. 180 tablet 0   tadalafil (CIALIS) 5 MG tablet Take 5 mg by mouth daily.     triamcinolone cream (KENALOG)  0.5 % Apply 1 Application topically 2 (two) times daily as needed (tick bites).     UNABLE TO FIND Lift chair DX: M47.812 1 each 0   UNABLE TO FIND Blood pressure cuff DX: I10 1 each 0   valACYclovir (VALTREX) 500 MG tablet TAKE 1 TABLET BY MOUTH TWICE A DAY 180 tablet 1   No current facility-administered medications for this visit.    Allergies as of 04/23/2023 - Review Complete 04/23/2023  Allergen Reaction Noted   Myrbetriq [mirabegron] Nausea And Vomiting 09/27/2019   Doxycycline Other (See Comments) 03/13/2023   Jardiance [empagliflozin]  06/20/2022   Niacin and related Itching 06/21/2015   Nsaids  05/27/2022   Other Diarrhea and Nausea And Vomiting 12/21/2020   Statins Itching 06/21/2015    Family History  Problem Relation Age of Onset   Hypertension Mother    Heart attack Mother    Osteoarthritis Mother    Stroke Father    Hypertension Father    Stroke Sister    Stroke Sister    Colon cancer Neg Hx    Prostate cancer Neg Hx    Cancer - Lung Neg Hx     Social History   Socioeconomic History   Marital status: Divorced    Spouse name: Not on file   Number of children: 1   Years of education: 12   Highest education level: Master's degree (e.g., MA, MS, MEng, MEd, MSW, MBA)  Occupational  History   Occupation: retired  Tobacco Use   Smoking status: Former    Current packs/day: 0.00    Average packs/day: 1 pack/day for 38.0 years (38.0 ttl pk-yrs)    Types: Cigarettes    Start date: 12/1958    Quit date: 12/1996    Years since quitting: 26.3   Smokeless tobacco: Never  Vaping Use   Vaping status: Never Used  Substance and Sexual Activity   Alcohol use: Yes    Comment: occassionally   Drug use: No   Sexual activity: Not Currently  Other Topics Concern   Not on file  Social History Narrative   Lives with his significant other, pt is retired    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  (12/05/2022)   Overall Financial Resource Strain (CARDIA)    Difficulty of Paying Living Expenses: Not hard at all  Food Insecurity: No Food Insecurity (02/13/2023)   Hunger Vital Sign    Worried About Running Out of Food in the Last Year: Never true    Ran Out of Food in the Last Year: Never true  Transportation Needs: No Transportation Needs (02/13/2023)   PRAPARE - Administrator, Civil Service (Medical): No    Lack of Transportation (Non-Medical): No  Physical Activity: Inactive (12/05/2022)   Exercise Vital Sign    Days of Exercise per Week: 0 days    Minutes of Exercise per Session: 0 min  Stress: No Stress Concern Present (12/05/2022)   [employer] of Occupational Health - Occupational Stress Questionnaire    Feeling of Stress : Only a little  Social Connections: Socially Isolated (12/05/2022)   Social Connection and Isolation Panel [NHANES]    Frequency of Communication with Friends and Family: More than three times a week    Frequency of Social Gatherings with Friends and Family: Once a week    Attends Religious Services: Never    Database administrator or Organizations: No    Attends Banker Meetings: Never  Marital Status: Divorced    Subjective: Review of Systems  Constitutional:  Negative for chills and  fever.  HENT:  Negative for congestion and hearing loss.   Eyes:  Negative for blurred vision and double vision.  Respiratory:  Negative for cough and shortness of breath.   Cardiovascular:  Negative for chest pain and palpitations.  Gastrointestinal:  Positive for diarrhea and heartburn. Negative for abdominal pain, blood in stool, constipation, melena and vomiting.  Genitourinary:  Negative for dysuria and urgency.  Musculoskeletal:  Negative for joint pain and myalgias.  Skin:  Negative for itching and rash.  Neurological:  Negative for dizziness and headaches.  Psychiatric/Behavioral:  Negative for depression. The patient is not nervous/anxious.      Objective: BP 129/63 (BP Location: Left Arm, Patient Position: Sitting, Cuff Size: Large)   Pulse 63   Temp 97.6 F (36.4 C) (Oral)   Ht 5' 6.75" (1.695 m)   Wt 197 lb 12.8 oz (89.7 kg)   BMI 31.21 kg/m  Physical Exam Constitutional:      Appearance: Normal appearance.  HENT:     Head: Normocephalic and atraumatic.  Eyes:     Extraocular Movements: Extraocular movements intact.     Conjunctiva/sclera: Conjunctivae normal.  Cardiovascular:     Rate and Rhythm: Normal rate and regular rhythm.  Pulmonary:     Effort: Pulmonary effort is normal.     Breath sounds: Normal breath sounds.  Abdominal:     General: Bowel sounds are normal.     Palpations: Abdomen is soft.  Musculoskeletal:        General: Normal range of motion.     Cervical back: Normal range of motion and neck supple.  Skin:    General: Skin is warm.  Neurological:     General: No focal deficit present.     Mental Status: He is alert and oriented to person, place, and time.  Psychiatric:        Mood and Affect: Mood normal.        Behavior: Behavior normal.      Assessment/Plan:  1.  Chronic diarrhea-likely has component of IBS diarrhea predominant.  Colon biopsies negative for microscopic colitis. Thyroid function has been normal. No melena or brbpr.   Probiotics did not help.  Patient recommend to take Imodium half tablet once daily and see how he does.  Increase to 1 tablet daily if no clinical improvement.  Consider 2-week course of Xifaxan.  Can consider trial of pancreatic enzymes or further testing with pancreatic elastase.  2.  Chronic GERD-well-controlled on pantoprazole.  Will continue.  3.  Anemia/thrombocytopenia- EGD and Colonoscopy up to date. EGD with gastritis, questionable varix. Biopsies without any significance. Colonoscopy with hemorrhoids and multiple tubular adenomas.   Will order right upper quadrant ultrasound with elastography today.  Follow-up in 3 months.  04/23/2023 9:55 AM   Disclaimer: This note was dictated with voice recognition software. Similar sounding words can inadvertently be transcribed and may not be corrected upon review.

## 2023-04-24 DIAGNOSIS — M65351 Trigger finger, right little finger: Secondary | ICD-10-CM | POA: Diagnosis not present

## 2023-04-24 DIAGNOSIS — M65849 Other synovitis and tenosynovitis, unspecified hand: Secondary | ICD-10-CM | POA: Diagnosis not present

## 2023-04-24 DIAGNOSIS — W57XXXA Bitten or stung by nonvenomous insect and other nonvenomous arthropods, initial encounter: Secondary | ICD-10-CM | POA: Diagnosis not present

## 2023-04-24 DIAGNOSIS — M13841 Other specified arthritis, right hand: Secondary | ICD-10-CM | POA: Diagnosis not present

## 2023-04-24 DIAGNOSIS — M79641 Pain in right hand: Secondary | ICD-10-CM | POA: Diagnosis not present

## 2023-04-25 ENCOUNTER — Other Ambulatory Visit: Payer: Self-pay | Admitting: "Endocrinology

## 2023-04-28 ENCOUNTER — Ambulatory Visit: Payer: Medicare HMO | Admitting: Internal Medicine

## 2023-04-30 NOTE — Telephone Encounter (Signed)
Pt called in and he is aware of appt deails

## 2023-05-01 ENCOUNTER — Other Ambulatory Visit: Payer: Self-pay | Admitting: Family Medicine

## 2023-05-07 ENCOUNTER — Ambulatory Visit (HOSPITAL_COMMUNITY)
Admission: RE | Admit: 2023-05-07 | Discharge: 2023-05-07 | Disposition: A | Discharge status: Home / Self Care | Payer: Medicare HMO | Source: Ambulatory Visit | Attending: Internal Medicine | Admitting: Internal Medicine

## 2023-05-07 DIAGNOSIS — D649 Anemia, unspecified: Secondary | ICD-10-CM | POA: Insufficient documentation

## 2023-05-07 DIAGNOSIS — R197 Diarrhea, unspecified: Secondary | ICD-10-CM | POA: Insufficient documentation

## 2023-05-07 DIAGNOSIS — D696 Thrombocytopenia, unspecified: Secondary | ICD-10-CM | POA: Diagnosis not present

## 2023-05-07 DIAGNOSIS — K219 Gastro-esophageal reflux disease without esophagitis: Secondary | ICD-10-CM | POA: Diagnosis not present

## 2023-05-13 ENCOUNTER — Telehealth: Payer: Self-pay

## 2023-05-13 NOTE — Telephone Encounter (Signed)
Pt called and was inquiring of his procedure report. States he didn't understand it. Advised the pt it was just read yesterday and once you read it I would contact him with the understanding

## 2023-05-16 ENCOUNTER — Telehealth: Payer: Self-pay | Admitting: Internal Medicine

## 2023-05-16 ENCOUNTER — Other Ambulatory Visit: Payer: Self-pay | Admitting: Internal Medicine

## 2023-05-16 DIAGNOSIS — I7121 Aneurysm of the ascending aorta, without rupture: Secondary | ICD-10-CM

## 2023-05-16 NOTE — Telephone Encounter (Signed)
Patient wants to get orders for his annual CT scan.

## 2023-05-16 NOTE — Telephone Encounter (Signed)
Called pt back to let him know that his order for the CTA has been sent and to look for a call from scheduling. Pt verbalized understanding.

## 2023-05-16 NOTE — Telephone Encounter (Signed)
Pt came in wants a referral to dermatology Dr. Evon Slack  in [facility]   Also wants to have orders for repeat CT scan   Wants a cll back

## 2023-05-16 NOTE — Progress Notes (Signed)
Mr. Smyser contacted our office this afternoon requesting a repeat CT scan.  Reviewing imaging from the past year, a 4.3 cm aneurysmal ascending thoracic aorta was noted on CT chest abdomen pelvis obtained 06/11/2022.  Annual surveillance by CTA or MRI was recommended.  Repeat CTA chest ordered today for surveillance.

## 2023-05-22 DIAGNOSIS — M65351 Trigger finger, right little finger: Secondary | ICD-10-CM | POA: Diagnosis not present

## 2023-05-22 DIAGNOSIS — M13841 Other specified arthritis, right hand: Secondary | ICD-10-CM | POA: Diagnosis not present

## 2023-05-22 DIAGNOSIS — E119 Type 2 diabetes mellitus without complications: Secondary | ICD-10-CM | POA: Diagnosis not present

## 2023-05-24 ENCOUNTER — Other Ambulatory Visit: Payer: Self-pay | Admitting: Internal Medicine

## 2023-05-26 ENCOUNTER — Ambulatory Visit (HOSPITAL_COMMUNITY)
Admission: RE | Admit: 2023-05-26 | Discharge: 2023-05-26 | Disposition: A | Discharge status: Home / Self Care | Payer: Medicare HMO | Source: Ambulatory Visit | Attending: Internal Medicine | Admitting: Internal Medicine

## 2023-05-26 DIAGNOSIS — I7121 Aneurysm of the ascending aorta, without rupture: Secondary | ICD-10-CM | POA: Diagnosis not present

## 2023-05-26 DIAGNOSIS — E041 Nontoxic single thyroid nodule: Secondary | ICD-10-CM | POA: Diagnosis not present

## 2023-05-26 DIAGNOSIS — J9859 Other diseases of mediastinum, not elsewhere classified: Secondary | ICD-10-CM | POA: Diagnosis not present

## 2023-05-26 DIAGNOSIS — I2510 Atherosclerotic heart disease of native coronary artery without angina pectoris: Secondary | ICD-10-CM | POA: Diagnosis not present

## 2023-05-26 LAB — POCT I-STAT CREATININE: Creatinine, Ser: 1.1 mg/dL (ref 0.61–1.24)

## 2023-05-26 MED ORDER — IOHEXOL 350 MG/ML SOLN
100.0000 mL | Freq: Once | INTRAVENOUS | Status: AC | PRN
  Administered 2023-05-26: 75 mL via INTRAVENOUS

## 2023-05-27 DIAGNOSIS — N182 Chronic kidney disease, stage 2 (mild): Secondary | ICD-10-CM | POA: Diagnosis not present

## 2023-05-27 DIAGNOSIS — I10 Essential (primary) hypertension: Secondary | ICD-10-CM | POA: Diagnosis not present

## 2023-05-27 DIAGNOSIS — E1122 Type 2 diabetes mellitus with diabetic chronic kidney disease: Secondary | ICD-10-CM | POA: Diagnosis not present

## 2023-05-28 ENCOUNTER — Ambulatory Visit: Payer: Medicare HMO | Admitting: "Endocrinology

## 2023-05-28 ENCOUNTER — Encounter: Payer: Self-pay | Admitting: "Endocrinology

## 2023-05-28 ENCOUNTER — Telehealth: Payer: Self-pay | Admitting: Internal Medicine

## 2023-05-28 VITALS — BP 136/68 | HR 72 | Ht 66.75 in | Wt 199.0 lb

## 2023-05-28 DIAGNOSIS — I10 Essential (primary) hypertension: Secondary | ICD-10-CM

## 2023-05-28 DIAGNOSIS — E039 Hypothyroidism, unspecified: Secondary | ICD-10-CM | POA: Diagnosis not present

## 2023-05-28 DIAGNOSIS — E6609 Other obesity due to excess calories: Secondary | ICD-10-CM | POA: Diagnosis not present

## 2023-05-28 DIAGNOSIS — E1159 Type 2 diabetes mellitus with other circulatory complications: Secondary | ICD-10-CM

## 2023-05-28 DIAGNOSIS — E782 Mixed hyperlipidemia: Secondary | ICD-10-CM

## 2023-05-28 DIAGNOSIS — Z794 Long term (current) use of insulin: Secondary | ICD-10-CM | POA: Diagnosis not present

## 2023-05-28 DIAGNOSIS — Z6831 Body mass index (BMI) 31.0-31.9, adult: Secondary | ICD-10-CM

## 2023-05-28 LAB — POCT GLYCOSYLATED HEMOGLOBIN (HGB A1C): HbA1c, POC (controlled diabetic range): 9.2 % — AB (ref 0.0–7.0)

## 2023-05-28 MED ORDER — MYGLUCOHEALTH LANCETS 30G MISC: 3 refills | Status: DC

## 2023-05-28 MED ORDER — GLIPIZIDE ER 5 MG PO TB24: 10.0000 mg | ORAL_TABLET | Freq: Every day | ORAL | 1 refills | Status: DC

## 2023-05-28 NOTE — Telephone Encounter (Signed)
Results have not been read yet.

## 2023-05-28 NOTE — Patient Instructions (Signed)

## 2023-05-28 NOTE — Telephone Encounter (Signed)
Patient is calling says he is expecting a call whenever his CT results come in.Please advise Thank you

## 2023-05-28 NOTE — Progress Notes (Signed)
05/28/2023, 1:17 PM   Endocrinology follow-up note  Subjective:    Patient ID: Ryan Herrera, male    DOB: March 22, 1946.  Ryan Herrera is being seen in follow-up after he was feeling consultation for management of currently uncontrolled symptomatic diabetes requested by  Billie Lade, MD.   Past Medical History:  Diagnosis Date   Arthritis    hands   Coronary artery disease    a. s/p CABG in 2008 at [facility] b. re-do CABG at [facility] in 03/2022 with left RA-RI and SVG-PDA   Diabetes mellitus without complication ([name])    GERD (gastroesophageal reflux disease)    H/O poliomyelitis    age 77.  now has "weak stomach muscles"   Hypertension    IBS (irritable bowel syndrome)    Iron deficiency anemia 09/22/2018   Myocardial infarct [facility])    Renal insufficiency    Thyroid disease     Past Surgical History:  Procedure Laterality Date   BACK SURGERY  1967   BACK SURGERY  10/04/2005   benign tumor removal  2004   BIOPSY  11/18/2022   Procedure: BIOPSY;  Surgeon: Lanelle Bal, DO;  [facility]: [facility];  Service: Endoscopy;;   COLONOSCOPY WITH PROPOFOL N/A 11/18/2022   Procedure: COLONOSCOPY WITH PROPOFOL;  Surgeon: Lanelle Bal, DO;  Location: [facility];  Service: Endoscopy;  Laterality: N/A;  8:30 am, pt did not want early appt. moved down (per PAT)   CORONARY ARTERY BYPASS GRAFT  2008   [facility] - 3 vessel   CORONARY ARTERY BYPASS GRAFT N/A 03/28/2022   Procedure: REDO CORONARY ARTERY BYPASS GRAFTING (CABG) X2 BYPASSES USING OPEN LEFT RADIAL ARTERY AND ENDOSCOPIC RIGHT GREATER SAPHENOUS VEIN HARVEST;  Surgeon: Loreli Slot, MD;  Location: [facility] OR;  Service: Open Heart Surgery;  Laterality: N/A;   CYSTOSCOPY WITH INSERTION OF UROLIFT     ESOPHAGOGASTRODUODENOSCOPY (EGD) WITH PROPOFOL N/A 11/18/2022   Procedure: ESOPHAGOGASTRODUODENOSCOPY (EGD) WITH PROPOFOL;  Surgeon: Lanelle Bal, DO;  Location: [facility];  Service:  Endoscopy;  Laterality: N/A;   HERNIA REPAIR Bilateral    inguinal   HYDROCELE EXCISION / REPAIR     JOINT REPLACEMENT     left knee x2, rright x1   LEFT HEART CATH AND CORS/GRAFTS ANGIOGRAPHY N/A 03/25/2022   Procedure: LEFT HEART CATH AND CORS/GRAFTS ANGIOGRAPHY;  Surgeon: Corky Crafts, MD;  Location: [facility] INVASIVE CV LAB;  Service: Cardiovascular;  Laterality: N/A;   POLYPECTOMY  11/18/2022   Procedure: POLYPECTOMY;  Surgeon: Lanelle Bal, DO;  [facility]: [facility];  Service: Endoscopy;;   PROSTATE SURGERY N/A 01/2021   TEE WITHOUT CARDIOVERSION N/A 03/28/2022   Procedure: TRANSESOPHAGEAL ECHOCARDIOGRAM (TEE);  Surgeon: Loreli Slot, MD;  Location: [facility] OR;  Service: Open Heart Surgery;  Laterality: N/A;   TONSILLECTOMY      Social History   Socioeconomic History   Marital status: Divorced    Spouse name: Not on file   Number of children: 1   Years of education: 12   Highest education level: Master's degree (e.g., MA, MS, MEng, MEd, MSW, MBA)  Occupational History   Occupation: retired  Tobacco Use   Smoking status: Former    Current packs/day: 0.00    Average packs/day: 1 pack/day for 38.0 years (38.0 ttl pk-yrs)    Types: Cigarettes    Start date: 12/1958    Quit date: 12/1996    Years since  quitting: 26.4   Smokeless tobacco: Never  Vaping Use   Vaping status: Never Used  Substance and Sexual Activity   Alcohol use: Yes    Comment: occassionally   Drug use: No   Sexual activity: Not Currently  Other Topics Concern   Not on file  Social History Narrative   Lives with his significant other, pt is retired    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  (12/05/2022)   Overall Financial Resource Strain (CARDIA)    Difficulty of Paying Living Expenses: Not hard at all  Food Insecurity: No Food Insecurity (02/13/2023)   Hunger Vital Sign    Worried About Running Out of Food in the Last Year: Never true    Ran Out of Food  in the Last Year: Never true  Transportation Needs: No Transportation Needs (02/13/2023)   PRAPARE - Administrator, Civil Service (Medical): No    Lack of Transportation (Non-Medical): No  Physical Activity: Inactive (12/05/2022)   Exercise Vital Sign    Days of Exercise per Week: 0 days    Minutes of Exercise per Session: 0 min  Stress: No Stress Concern Present (12/05/2022)   [employer] of Occupational Health - Occupational Stress Questionnaire    Feeling of Stress : Only a little  Social Connections: Socially Isolated (12/05/2022)   Social Connection and Isolation Panel [NHANES]    Frequency of Communication with Friends and Family: More than three times a week    Frequency of Social Gatherings with Friends and Family: Once a week    Attends Religious Services: Never    Database administrator or Organizations: No    Attends Engineer, structural: Never    Marital Status: Divorced    Family History  Problem Relation Age of Onset   Hypertension Mother    Heart attack Mother    Osteoarthritis Mother    Stroke Father    Hypertension Father    Stroke Sister    Stroke Sister    Colon cancer Neg Hx    Prostate cancer Neg Hx    Cancer - Lung Neg Hx     Outpatient Encounter Medications as of 05/28/2023  Medication Sig   acetaminophen (TYLENOL) 650 MG CR tablet Take 1,300 mg by mouth every 8 (eight) hours as needed for pain.   allopurinol (ZYLOPRIM) 100 MG tablet Take 1 tablet (100 mg total) by mouth daily.   amoxicillin (AMOXIL) 500 MG capsule Take 500 mg by mouth. 4 capsules before dental work   aspirin EC 81 MG tablet Take 81 mg by mouth daily.   Carboxymethylcellulose Sodium (THERATEARS PF OP) Place 1 drop into both eyes daily as needed (dry eyes).   carvedilol (COREG) 6.25 MG tablet Take 1 tablet (6.25 mg total) by mouth 2 (two) times daily.   Cholecalciferol (D3 2000) 50 MCG (2000 UT) CAPS Take by mouth. One daily   clotrimazole-betamethasone  (LOTRISONE) cream Apply 1 Application topically 2 (two) times daily as needed (rash).   cyanocobalamin (VITAMIN B12) 1000 MCG tablet Take 1,000 mcg by mouth daily.   Evolocumab (REPATHA SURECLICK) 140 MG/ML SOAJ INJECT 140 MG INTO THE SKIN EVERY 14 (FOURTEEN) DAYS.   ezetimibe (ZETIA) 10 MG tablet TAKE 1 TABLET BY MOUTH EVERY DAY   glipiZIDE (GLUCOTROL XL) 5 MG 24 hr tablet Take 5 mg by mouth daily with breakfast.   glucose blood test strip 1 each by Other route as needed. Use to check  blood glucose twice daily as instructed   hydrochlorothiazide (HYDRODIURIL) 25 MG tablet TAKE 1 TABLET (25 MG TOTAL) BY MOUTH DAILY.   insulin degludec (TRESIBA FLEXTOUCH) 100 UNIT/ML FlexTouch Pen Inject 35 Units into the skin at bedtime.   Insulin Degludec (TRESIBA [place]) Inject into the skin. 35 units bedtime   Insulin Pen Needle (BD PEN NEEDLE NANO 2ND GEN) 32G X 4 MM MISC Use as directed to inject insulin daily   ketoconazole (NIZORAL) 2 % cream APPLY 1 APPLICATION TOPICALLY DAILY   ketoconazole (NIZORAL) 2 % shampoo Apply 1 Application topically See admin instructions. Uses a small amount with every shower   levothyroxine (SYNTHROID) 100 MCG tablet TAKE 1 TABLET BY MOUTH EVERY DAY BEFORE BREAKFAST   metFORMIN (GLUCOPHAGE) 1000 MG tablet TAKE 1 TABLET (1,000 MG TOTAL) BY MOUTH TWICE A DAY WITH FOOD (Patient taking differently: Take 1,000 mg by mouth 2 (two) times daily with a meal.)   pantoprazole (PROTONIX) 40 MG tablet Take 1 tablet (40 mg total) by mouth daily.   tadalafil (CIALIS) 5 MG tablet Take 5 mg by mouth daily.   triamcinolone cream (KENALOG) 0.5 % Apply 1 Application topically 2 (two) times daily as needed (tick bites).   UNABLE TO FIND Lift chair DX: M47.812   UNABLE TO FIND Blood pressure cuff DX: I10   valACYclovir (VALTREX) 500 MG tablet TAKE 1 TABLET BY MOUTH TWICE A DAY   No facility-administered encounter medications on file as of 05/28/2023.    ALLERGIES: Allergies  Allergen Reactions    Myrbetriq [Mirabegron] Nausea And Vomiting   Doxycycline Other (See Comments)    Pt states he is not allergic to doxy    Jardiance [Empagliflozin]     Pt does not like this medication   Niacin And Related Itching   Nsaids     Kidney damage    Other Diarrhea and Nausea And Vomiting    PT does not want OPIOIDS   GLP-1 drugs - Nausea, vomiting, diarrhea, belching   Statins Itching    VACCINATION STATUS: Immunization History  Administered Date(s) Administered   Fluad Quad(high Dose 65+) 06/14/2022   Influenza Split 07/19/2013   Influenza, High Dose Seasonal PF 06/13/2015, 07/18/2016, 06/11/2017, 07/08/2018, 07/02/2019, 09/15/2020, 07/27/2021   Influenza,inj,Quad PF,6+ Mos 08/08/2014   Influenza-Unspecified 07/15/2012, 07/15/2013, 08/08/2014, 06/13/2015, 07/18/2016   PFIZER Comirnaty(Gray Top)Covid-19 Tri-Sucrose Vaccine 11/27/2019, 12/19/2019, 07/26/2020, 04/20/2021   PPD Test 01/22/2013, 01/22/2013   Pfizer Covid-19 Vaccine Bivalent Booster 59yrs & up 07/27/2021   Pneumococcal Conjugate-13 05/04/2014   Pneumococcal Polysaccharide-23 07/15/2012   Td 02/15/2003   Tdap 05/04/2014, 03/09/2022   Zoster Recombinant(Shingrix) 02/12/2017, 02/13/2017, 06/04/2017    Diabetes He presents for his follow-up diabetic visit. He has type 2 diabetes mellitus. Onset time: He was diagnosed at approximate age of 48 years. His disease course has been worsening. There are no hypoglycemic associated symptoms. Pertinent negatives for hypoglycemia include no confusion, headaches, pallor or seizures. Pertinent negatives for diabetes include no chest pain, no fatigue, no polydipsia, no polyphagia, no polyuria and no weakness. There are no hypoglycemic complications. Symptoms are worsening. Diabetic complications include heart disease. Risk factors for coronary artery disease include dyslipidemia, diabetes mellitus, family history, obesity, male sex, hypertension, tobacco exposure and sedentary lifestyle. Current  diabetic treatment includes insulin injections (He is currently on Tresiba 30 units daily, metformin 1000 mg p.o. twice daily, glipizide 10 mg p.o. twice daily, Jardiance 10 mg p.o. once a day.). His weight is fluctuating minimally. He is following a generally unhealthy  diet. When asked about meal planning, he reported none. He rarely participates in exercise. His home blood glucose trend is increasing steadily. His breakfast blood glucose range is generally 110-130 mg/dl. His overall blood glucose range is 110-130 mg/dl. Ryan Herrera presents with a meter showing only fasting glycemic profile averaging 125.  However, his previsit labs show A1c of 9.2%.  This is not improving from his last A1c of 9.3%. He remains on Tresiba 35 units nightly, metformin 1000 mg p.o. twice daily, and glipizide 5 mg p.o. once a day.) An ACE inhibitor/angiotensin II receptor blocker is being taken.  Hyperlipidemia This is a chronic problem. The current episode started more than 1 year ago. The problem is controlled. Exacerbating diseases include diabetes. Pertinent negatives include no chest pain, myalgias or shortness of breath. Treatments tried: He is on Repatha injection as well as Zetia 10 mg p.o. daily. Risk factors for coronary artery disease include dyslipidemia, diabetes mellitus, family history, obesity, male sex, hypertension and a sedentary lifestyle.  Hypertension This is a chronic problem. The current episode started more than 1 year ago. Pertinent negatives include no chest pain, headaches, neck pain, palpitations or shortness of breath. Risk factors for coronary artery disease include family history, dyslipidemia, diabetes mellitus, male gender, obesity and sedentary lifestyle. Past treatments include angiotensin blockers. Hypertensive end-organ damage includes CAD/MI.       Objective:       05/28/2023    1:04 PM 04/23/2023    9:39 AM 03/17/2023    3:58 PM  Vitals with BMI  Height 5' 6.75" 5' 6.75"   Weight  199 lbs 197 lbs 13 oz   BMI 31.42 31.23   Systolic 136 129 562  Diastolic 68 63 77  Pulse 72 63     BP 136/68   Pulse 72   Ht 5' 6.75" (1.695 m)   Wt 199 lb (90.3 kg)   BMI 31.40 kg/m   Wt Readings from Last 3 Encounters:  05/28/23 199 lb (90.3 kg)  04/23/23 197 lb 12.8 oz (89.7 kg)  03/17/23 197 lb (89.4 kg)     CMP ( most recent) CMP     Component Value Date/Time   NA 136 02/14/2023 0425   NA 137 01/22/2023 1301   NA 139 04/29/2014 1500   K 3.5 02/14/2023 0425   K 4.2 04/29/2014 1500   CL 100 02/14/2023 0425   CL 103 04/29/2014 1500   CO2 28 02/14/2023 0425   CO2 27 04/29/2014 1500   GLUCOSE 136 (H) 02/14/2023 0425   GLUCOSE 111 (H) 04/29/2014 1500   BUN 19 02/14/2023 0425   BUN 12 01/22/2023 1301   BUN 16 04/29/2014 1500   CREATININE 1.10 05/26/2023 1103   CREATININE 1.14 04/29/2014 1500   CALCIUM 8.9 02/14/2023 0425   CALCIUM 9.2 04/29/2014 1500   PROT 6.9 01/22/2023 1301   ALBUMIN 4.0 01/22/2023 1301   AST 22 01/22/2023 1301   ALT 24 01/22/2023 1301   ALKPHOS 94 01/22/2023 1301   BILITOT 0.3 01/22/2023 1301   GFRNONAA >60 02/14/2023 0425   GFRNONAA >60 04/29/2014 1500   GFRAA >60 05/12/2020 1848   GFRAA >60 04/29/2014 1500     Diabetic Labs (most recent): Lab Results  Component Value Date   HGBA1C 9.3 (H) 01/22/2023   HGBA1C 8.4 (H) 10/02/2022   HGBA1C 7.3 (A) 06/20/2022    Lipid Panel     Component Value Date/Time   CHOL 94 02/14/2023 0425   CHOL 131 01/22/2023 1301  TRIG 89 02/14/2023 0425   HDL 29 (L) 02/14/2023 0425   HDL 38 (L) 01/22/2023 1301   CHOLHDL 3.2 02/14/2023 0425   VLDL 18 02/14/2023 0425   LDLCALC 47 02/14/2023 0425   LDLCALC 78 01/22/2023 1301   LABVLDL 15 01/22/2023 1301        Lab Results  Component Value Date   TSH 3.950 01/22/2023   TSH 2.990 10/02/2022   TSH 2.349 03/24/2022   TSH 5.760 (H) 03/12/2022   TSH 4.124 12/21/2018   TSH 5.545 (H) 09/22/2018   TSH 4.84 (H) 04/29/2014   FREET4 1.62 01/22/2023    FREET4 1.44 10/02/2022   FREET4 1.14 (H) 03/24/2022   FREET4 1.01 12/21/2018      Assessment & Plan:   1. DM type 2 causing vascular disease ([name])  - Ryan Herrera has currently uncontrolled symptomatic type 2 DM since  77 years of age.  Elefterios presents with a meter showing only fasting glycemic profile averaging 125.  However, his previsit labs show A1c of 9.2%.  This is not improving from his last A1c of 9.3%. He remains on Tresiba 35 units nightly, metformin 1000 mg p.o. twice daily, and glipizide 5 mg p.o. once a day.   Recent labs reviewed.  - I had a long discussion with him about the progressive nature of diabetes and the pathology behind its complications. -his diabetes is complicated by coronary artery disease status post cardiac bypass in 2008, recurrent coronary artery disease, obesity/sedentary life, comorbid hypertension /hyperlipidemia, history of smoking and he remains at a high risk for more acute and chronic complications which include CAD, CVA, CKD, retinopathy, and neuropathy. These are all discussed in detail with him.  - I discussed all available options of managing his diabetes including de-escalation of medications. I have counseled him on diet  and weight management  by adopting a Whole Food , Plant Predominant  ( WFPP) nutrition as recommended by [employer] of Lifestyle Medicine. Patient is encouraged to switch to  unprocessed or minimally processed  complex starch, adequate protein intake (mainly plant source), minimal liquid fat ( mainly vegetable oils), plenty of fruits, and vegetables.  -  he is advised to stick to a routine mealtimes to eat 3 complete meals a day and snack only when necessary ( to snack only to correct hypoglycemia BG <70 day time or <100 at night).   - he acknowledges that there is a room for improvement in his food and drink choices. - Suggestion is made for him to avoid simple carbohydrates  from his diet including Cakes,  Sweet Desserts, Ice Cream, Soda (diet and regular), Sweet Tea, Candies, Chips, Cookies, Store Bought Juices, Alcohol , Artificial Sweeteners,  Coffee Creamer, and "Sugar-free" Products, Lemonade. This will help patient to have more stable blood glucose profile and potentially avoid unintended weight gain.  The following Lifestyle Medicine recommendations according to American College of Lifestyle Medicine  [facility]) were discussed and and offered to patient and he  agrees to start the journey:  A. Whole Foods, Plant-Based Nutrition comprising of fruits and vegetables, plant-based proteins, whole-grain carbohydrates was discussed in detail with the patient.   A list for source of those nutrients were also provided to the patient.  Patient will use only water or unsweetened tea for hydration. B.  The need to stay away from risky substances including alcohol, smoking; obtaining 7 to 9 hours of restorative sleep, at least 150 minutes of moderate intensity exercise weekly, the importance of  healthy social connections,  and stress management techniques were discussed. C.  A full color page of  Calorie density of various food groups per pound showing examples of each food groups was provided to the patient.   - he will be scheduled with Norm Salt, RDN, CDE for individualized diabetes education.  - I have approached him with the following plan to manage  his diabetes and patient agrees:   -Based on his presentation with significantly above target glycemic profile, he was approached for multiple daily injections of insulin.  However, he is not interested and reports that he does not have a regular meal timing.  He is  not willing to monitor blood glucose more than once a day at this time.   He does not want to wear a CGM. He will not tolerate any more [place] , advised to continue Tresiba  35 units nightly, continue metformin 1000 mg p.o. twice daily. Until he engages for proper monitoring for MDI, he is  advised to increase  glipizide to 10 mg p.o. daily at breakfast.    - he is encouraged to monitor glucose at least 2 times daily- before breakfast and at bed time, call clinic for blood glucose levels less than 70 or above 200 mg /dl fasting U4/QIHK.   - Specific targets for  A1c;  LDL, HDL,  and Triglycerides were discussed with the patient.  2) Blood Pressure /Hypertension:   His BP is controlled to target.  he is advised to continue his current medications including losartan 25 mg p.o. daily with breakfast .   3) Lipids/Hyperlipidemia:   Review of his recent lipid panel showed controlled LDL at 78.  He does not tolerate statins.   He is advised to continue Repatha 140 mg every 14 days, Zetia 10 mg every night.   Side effects and precautions discussed with him.  If he indeed engages enough for  whole food , plant-based diet, he had a chance to come off of these antilipid medications.  4)  Weight/Diet:  Body mass index is 31.4 kg/m.  -   clearly complicating his diabetes care.   he is  a candidate for weight loss. I discussed with him the fact that loss of 5 - 10% of his  current body weight will have the most impact on his diabetes management.  The above detailed  ACLM recommendations for nutrition, exercise, sleep, social life, avoidance of risky substances, the need for restorative sleep   information will also detailed on discharge instructions.  5) Chronic Care/Health Maintenance:  -he  is on ARB  medications and  is encouraged to initiate and continue to follow up with Ophthalmology, Dentist,  Podiatrist at least yearly or according to recommendations, and advised to   stay away from smoking. I have recommended yearly flu vaccine and pneumonia vaccine at least every 5 years; moderate intensity exercise for up to 150 minutes weekly; and  sleep for 7- 9 hours a day.   6) hypothyroidism: Circumstance of diagnosis not available to review.  His recent thyroid function tests are consistent  with appropriate replacement.  He is advised to continue levothyroxine 100 mcg p.o. daily before breakfast.   - We discussed about the correct intake of his thyroid hormone, on empty stomach at fasting, with water, separated by at least 30 minutes from breakfast and other medications,  and separated by more than 4 hours from calcium, iron, multivitamins, acid reflux medications (PPIs). -Patient is made aware of the fact that  thyroid hormone replacement is needed for life, dose to be adjusted by periodic monitoring of thyroid function tests.   - he is  advised to maintain close follow up with Durwin Nora, Lucina Mellow, MD for primary care needs, as well as his other providers for optimal and coordinated care.   I spent  26  minutes in the care of the patient today including review of labs from CMP, Lipids, Thyroid Function, Hematology (current and previous including abstractions from other facilities); face-to-face time discussing  his blood glucose readings/logs, discussing hypoglycemia and hyperglycemia episodes and symptoms, medications doses, his options of short and long term treatment based on the latest standards of care / guidelines;  discussion about incorporating lifestyle medicine;  and documenting the encounter. Risk reduction counseling performed per USPSTF guidelines to reduce  obesity and cardiovascular risk factors.     Please refer to Patient Instructions for Blood Glucose Monitoring and Insulin/Medications Dosing Guide"  in media tab for additional information. Please  also refer to " Patient Self Inventory" in the Media  tab for reviewed elements of pertinent patient history.  Ryan Herrera participated in the discussions, expressed understanding, and voiced agreement with the above plans.  All questions were answered to his satisfaction. he is encouraged to contact clinic should he have any questions or concerns prior to his return visit.   Follow up plan: - No follow-ups on  file.  Marquis Lunch, MD [facility] [address] [place], [place] [address] Phone: [phone]  Fax: [phone]    05/28/2023, 1:17 PM  This note was partially dictated with voice recognition software. Similar sounding words can be transcribed inadequately or may not  be corrected upon review.

## 2023-05-30 ENCOUNTER — Other Ambulatory Visit: Payer: Self-pay

## 2023-05-30 MED ORDER — LANCETS MISC. MISC: 1.0000 | Freq: Three times a day (TID) | 0 refills | Status: DC

## 2023-05-30 MED ORDER — BLOOD GLUCOSE MONITORING SUPPL DEVI: 1.0000 | Freq: Two times a day (BID) | 0 refills | Status: DC

## 2023-05-30 MED ORDER — BLOOD GLUCOSE TEST VI STRP: 1.0000 | ORAL_STRIP | Freq: Three times a day (TID) | 0 refills | Status: DC

## 2023-05-30 MED ORDER — LANCET DEVICE MISC: 1.0000 | Freq: Three times a day (TID) | 0 refills | Status: DC

## 2023-06-05 ENCOUNTER — Other Ambulatory Visit: Payer: Self-pay

## 2023-06-09 ENCOUNTER — Other Ambulatory Visit: Payer: Self-pay | Admitting: Family Medicine

## 2023-06-09 DIAGNOSIS — B356 Tinea cruris: Secondary | ICD-10-CM

## 2023-06-11 ENCOUNTER — Encounter: Payer: Self-pay | Admitting: Oncology

## 2023-06-21 ENCOUNTER — Observation Stay (HOSPITAL_COMMUNITY)
Admission: EM | Admit: 2023-06-21 | Discharge: 2023-06-22 | Disposition: A | Discharge status: Home / Self Care | Payer: Medicare HMO | Attending: Internal Medicine | Admitting: Internal Medicine

## 2023-06-21 ENCOUNTER — Other Ambulatory Visit: Payer: Self-pay | Admitting: Family Medicine

## 2023-06-21 ENCOUNTER — Emergency Department (HOSPITAL_COMMUNITY): Payer: Medicare HMO

## 2023-06-21 ENCOUNTER — Encounter (HOSPITAL_COMMUNITY): Payer: Self-pay

## 2023-06-21 ENCOUNTER — Other Ambulatory Visit: Payer: Self-pay

## 2023-06-21 ENCOUNTER — Inpatient Hospital Stay (HOSPITAL_COMMUNITY): Payer: Medicare HMO

## 2023-06-21 DIAGNOSIS — E669 Obesity, unspecified: Secondary | ICD-10-CM

## 2023-06-21 DIAGNOSIS — S06340A Traumatic hemorrhage of right cerebrum without loss of consciousness, initial encounter: Secondary | ICD-10-CM | POA: Diagnosis not present

## 2023-06-21 DIAGNOSIS — E782 Mixed hyperlipidemia: Secondary | ICD-10-CM | POA: Diagnosis present

## 2023-06-21 DIAGNOSIS — S065X0A Traumatic subdural hemorrhage without loss of consciousness, initial encounter: Secondary | ICD-10-CM | POA: Diagnosis not present

## 2023-06-21 DIAGNOSIS — S065XAA Traumatic subdural hemorrhage with loss of consciousness status unknown, initial encounter: Principal | ICD-10-CM | POA: Diagnosis present

## 2023-06-21 DIAGNOSIS — R262 Difficulty in walking, not elsewhere classified: Secondary | ICD-10-CM | POA: Diagnosis not present

## 2023-06-21 DIAGNOSIS — Z7984 Long term (current) use of oral hypoglycemic drugs: Secondary | ICD-10-CM | POA: Insufficient documentation

## 2023-06-21 DIAGNOSIS — Z951 Presence of aortocoronary bypass graft: Secondary | ICD-10-CM | POA: Insufficient documentation

## 2023-06-21 DIAGNOSIS — S93409A Sprain of unspecified ligament of unspecified ankle, initial encounter: Secondary | ICD-10-CM | POA: Insufficient documentation

## 2023-06-21 DIAGNOSIS — Z794 Long term (current) use of insulin: Secondary | ICD-10-CM | POA: Diagnosis not present

## 2023-06-21 DIAGNOSIS — I672 Cerebral atherosclerosis: Secondary | ICD-10-CM | POA: Diagnosis not present

## 2023-06-21 DIAGNOSIS — I2510 Atherosclerotic heart disease of native coronary artery without angina pectoris: Secondary | ICD-10-CM | POA: Diagnosis not present

## 2023-06-21 DIAGNOSIS — I160 Hypertensive urgency: Secondary | ICD-10-CM

## 2023-06-21 DIAGNOSIS — Z87891 Personal history of nicotine dependence: Secondary | ICD-10-CM | POA: Diagnosis not present

## 2023-06-21 DIAGNOSIS — E1165 Type 2 diabetes mellitus with hyperglycemia: Secondary | ICD-10-CM | POA: Diagnosis not present

## 2023-06-21 DIAGNOSIS — Z6831 Body mass index (BMI) 31.0-31.9, adult: Secondary | ICD-10-CM | POA: Insufficient documentation

## 2023-06-21 DIAGNOSIS — Z7982 Long term (current) use of aspirin: Secondary | ICD-10-CM | POA: Diagnosis not present

## 2023-06-21 DIAGNOSIS — E11649 Type 2 diabetes mellitus with hypoglycemia without coma: Secondary | ICD-10-CM | POA: Diagnosis not present

## 2023-06-21 DIAGNOSIS — Z96653 Presence of artificial knee joint, bilateral: Secondary | ICD-10-CM | POA: Insufficient documentation

## 2023-06-21 DIAGNOSIS — J984 Other disorders of lung: Secondary | ICD-10-CM | POA: Diagnosis not present

## 2023-06-21 DIAGNOSIS — R220 Localized swelling, mass and lump, head: Secondary | ICD-10-CM | POA: Diagnosis not present

## 2023-06-21 DIAGNOSIS — M25572 Pain in left ankle and joints of left foot: Secondary | ICD-10-CM

## 2023-06-21 DIAGNOSIS — Y92009 Unspecified place in unspecified non-institutional (private) residence as the place of occurrence of the external cause: Secondary | ICD-10-CM

## 2023-06-21 DIAGNOSIS — M7732 Calcaneal spur, left foot: Secondary | ICD-10-CM | POA: Diagnosis not present

## 2023-06-21 DIAGNOSIS — S93402A Sprain of unspecified ligament of left ankle, initial encounter: Secondary | ICD-10-CM | POA: Diagnosis not present

## 2023-06-21 DIAGNOSIS — I6200 Nontraumatic subdural hemorrhage, unspecified: Secondary | ICD-10-CM | POA: Diagnosis not present

## 2023-06-21 DIAGNOSIS — W19XXXA Unspecified fall, initial encounter: Secondary | ICD-10-CM

## 2023-06-21 DIAGNOSIS — I10 Essential (primary) hypertension: Secondary | ICD-10-CM | POA: Diagnosis not present

## 2023-06-21 DIAGNOSIS — K219 Gastro-esophageal reflux disease without esophagitis: Secondary | ICD-10-CM | POA: Diagnosis not present

## 2023-06-21 DIAGNOSIS — E039 Hypothyroidism, unspecified: Secondary | ICD-10-CM

## 2023-06-21 DIAGNOSIS — W010XXA Fall on same level from slipping, tripping and stumbling without subsequent striking against object, initial encounter: Secondary | ICD-10-CM | POA: Diagnosis not present

## 2023-06-21 DIAGNOSIS — S0990XA Unspecified injury of head, initial encounter: Secondary | ICD-10-CM

## 2023-06-21 DIAGNOSIS — D500 Iron deficiency anemia secondary to blood loss (chronic): Secondary | ICD-10-CM | POA: Diagnosis not present

## 2023-06-21 DIAGNOSIS — S299XXA Unspecified injury of thorax, initial encounter: Secondary | ICD-10-CM | POA: Diagnosis not present

## 2023-06-21 DIAGNOSIS — S99912A Unspecified injury of left ankle, initial encounter: Secondary | ICD-10-CM | POA: Diagnosis not present

## 2023-06-21 DIAGNOSIS — I7090 Unspecified atherosclerosis: Secondary | ICD-10-CM | POA: Diagnosis not present

## 2023-06-21 DIAGNOSIS — M25552 Pain in left hip: Secondary | ICD-10-CM | POA: Diagnosis not present

## 2023-06-21 DIAGNOSIS — I517 Cardiomegaly: Secondary | ICD-10-CM | POA: Diagnosis not present

## 2023-06-21 LAB — BASIC METABOLIC PANEL
Anion gap: 8 (ref 5–15)
BUN: 16 mg/dL (ref 8–23)
CO2: 24 mmol/L (ref 22–32)
Calcium: 8.8 mg/dL — ABNORMAL LOW (ref 8.9–10.3)
Chloride: 101 mmol/L (ref 98–111)
Creatinine, Ser: 1.24 mg/dL (ref 0.61–1.24)
GFR, Estimated: 59 mL/min — ABNORMAL LOW (ref >60)
Glucose, Bld: 123 mg/dL — ABNORMAL HIGH (ref 70–99)
Potassium: 3.7 mmol/L (ref 3.5–5.1)
Sodium: 133 mmol/L — ABNORMAL LOW (ref 135–145)

## 2023-06-21 LAB — GLUCOSE, CAPILLARY
Glucose-Capillary: 57 mg/dL — ABNORMAL LOW (ref 70–99)
Glucose-Capillary: 160 mg/dL — ABNORMAL HIGH (ref 70–99)

## 2023-06-21 LAB — CBC WITH DIFFERENTIAL/PLATELET
Abs Immature Granulocytes: 0.02 10*3/uL (ref 0.00–0.07)
Basophils Absolute: 0 10*3/uL (ref 0.0–0.1)
Basophils Relative: 1 %
Eosinophils Absolute: 0.2 10*3/uL (ref 0.0–0.5)
Eosinophils Relative: 4 %
HCT: 34.8 % — ABNORMAL LOW (ref 39.0–52.0)
Hemoglobin: 11.5 g/dL — ABNORMAL LOW (ref 13.0–17.0)
Immature Granulocytes: 0 %
Lymphocytes Relative: 28 %
Lymphs Abs: 1.4 10*3/uL (ref 0.7–4.0)
MCH: 29.3 pg (ref 26.0–34.0)
MCHC: 33 g/dL (ref 30.0–36.0)
MCV: 88.5 fL (ref 80.0–100.0)
Monocytes Absolute: 0.5 10*3/uL (ref 0.1–1.0)
Monocytes Relative: 9 %
Neutro Abs: 2.9 10*3/uL (ref 1.7–7.7)
Neutrophils Relative %: 58 %
Platelets: 155 10*3/uL (ref 150–400)
RBC: 3.93 MIL/uL — ABNORMAL LOW (ref 4.22–5.81)
RDW: 13.8 % (ref 11.5–15.5)
WBC: 5 10*3/uL (ref 4.0–10.5)
nRBC: 0 % (ref 0.0–0.2)

## 2023-06-21 LAB — TYPE AND SCREEN
ABO/RH(D): A POS
Antibody Screen: NEGATIVE

## 2023-06-21 LAB — PROTIME-INR
INR: 1 (ref 0.8–1.2)
Prothrombin Time: 13.1 s (ref 11.4–15.2)

## 2023-06-21 LAB — CK TOTAL AND CKMB (NOT AT ARMC)
CK, MB: 2.2 ng/mL (ref 0.5–5.0)
Total CK: 34 U/L — ABNORMAL LOW (ref 49–397)

## 2023-06-21 LAB — APTT: aPTT: 27 s (ref 24–36)

## 2023-06-21 MED ORDER — ASPIRIN 81 MG PO TBEC
81.0000 mg | DELAYED_RELEASE_TABLET | Freq: Every day | ORAL | Status: DC
  Filled 2023-06-21: qty 1

## 2023-06-21 MED ORDER — CARVEDILOL 3.125 MG PO TABS
6.2500 mg | ORAL_TABLET | Freq: Two times a day (BID) | ORAL | Status: DC
  Administered 2023-06-21 – 2023-06-22 (×2): 6.25 mg via ORAL
  Filled 2023-06-21 (×2): qty 2

## 2023-06-21 MED ORDER — SODIUM CHLORIDE 0.9 % IV SOLN: INTRAVENOUS | Status: DC | PRN

## 2023-06-21 MED ORDER — ACETAMINOPHEN 325 MG PO TABS
650.0000 mg | ORAL_TABLET | Freq: Four times a day (QID) | ORAL | Status: DC | PRN
  Administered 2023-06-21 – 2023-06-22 (×2): 650 mg via ORAL
  Filled 2023-06-21 (×2): qty 2

## 2023-06-21 MED ORDER — EZETIMIBE 10 MG PO TABS
10.0000 mg | ORAL_TABLET | Freq: Every day | ORAL | Status: DC
  Administered 2023-06-22: 10 mg via ORAL
  Filled 2023-06-21: qty 1

## 2023-06-21 MED ORDER — ACETAMINOPHEN 650 MG RE SUPP: 650.0000 mg | Freq: Four times a day (QID) | RECTAL | Status: DC | PRN

## 2023-06-21 MED ORDER — PANTOPRAZOLE SODIUM 40 MG PO TBEC
40.0000 mg | DELAYED_RELEASE_TABLET | Freq: Every day | ORAL | Status: DC
  Administered 2023-06-22: 40 mg via ORAL
  Filled 2023-06-21: qty 1

## 2023-06-21 MED ORDER — DEXTROSE 50 % IV SOLN
12.5000 g | INTRAVENOUS | Status: AC
  Administered 2023-06-21: 12.5 g via INTRAVENOUS

## 2023-06-21 MED ORDER — HYDRALAZINE HCL 20 MG/ML IJ SOLN
10.0000 mg | Freq: Four times a day (QID) | INTRAMUSCULAR | Status: DC | PRN
  Administered 2023-06-21: 10 mg via INTRAVENOUS
  Filled 2023-06-21: qty 1

## 2023-06-21 MED ORDER — FENTANYL CITRATE PF 50 MCG/ML IJ SOSY: 50.0000 ug | PREFILLED_SYRINGE | Freq: Once | INTRAMUSCULAR | Status: DC

## 2023-06-21 MED ORDER — ONDANSETRON HCL 4 MG/2ML IJ SOLN: 4.0000 mg | Freq: Four times a day (QID) | INTRAMUSCULAR | Status: DC | PRN

## 2023-06-21 MED ORDER — CHLORHEXIDINE GLUCONATE CLOTH 2 % EX PADS
6.0000 | MEDICATED_PAD | Freq: Every day | CUTANEOUS | Status: DC
  Administered 2023-06-22: 6 via TOPICAL

## 2023-06-21 MED ORDER — DEXTROSE 50 % IV SOLN
INTRAVENOUS | Status: AC
  Filled 2023-06-21: qty 50

## 2023-06-21 MED ORDER — LEVOTHYROXINE SODIUM 100 MCG PO TABS
100.0000 ug | ORAL_TABLET | Freq: Every day | ORAL | Status: DC
  Administered 2023-06-22: 100 ug via ORAL
  Filled 2023-06-21: qty 1

## 2023-06-21 MED ORDER — ONDANSETRON HCL 4 MG PO TABS: 4.0000 mg | ORAL_TABLET | Freq: Four times a day (QID) | ORAL | Status: DC | PRN

## 2023-06-21 NOTE — H&P (Addendum)
History and Physical    Patient: Ryan Herrera:[id number] DOB: 27-Jan-1946 DOA: 06/21/2023 DOS: the patient was seen and examined on 06/21/2023 PCP: Billie Lade, MD  Patient coming from: Home  Chief Complaint:  Chief Complaint  Patient presents with   Fall   HPI: Ryan Herrera is a 78 y.o. male with medical history significant of hypertension, T2DM, CAD status post CABG hypothyroidism, GERD, hyperlipidemia who presents to the emergency department due to a fall.  Patient states that he fell off a step (about 2 feet off the ground) and fell backwards hitting his head and also sustained left-sided back pain.  He endorsed twisting his left ankle during the fall and also complained of left ankle pain.  He denies loss of consciousness or being on any blood thinner, but takes aspirin daily denies nausea, vomiting, numbness or tingling, chest pain, shortness of breath.  ED Course:  In the emergency department, he was hemodynamically stable.  Workup in the ED showed normocytic anemia.  BMP was normal except for sodium of 133, blood glucose 123. CT head without contrast showed acute hemorrhage along the right interhemispheric falx partially effacing the sulci of the medial posterior right frontal lobe.  No other significant mass effect associated.  Stable atrophy and white matter disease. This likely reflects the sequela of chronic microvascular ischemia.  Remote lacunar infarcts involving the thalami bilaterally are stable. Soft tissues swelling in the left suboccipital scalp without underlying fracture.   Left hip x-ray showed no acute fracture Left ankle showed no acute fracture or dislocation Chest x-ray showed no acute airspace opacity IV fentanyl 50 mcg x 1 was given. Neurosurgery team at [facility] was consulted and stated that patient can stay at [facility] given the stable neurological status and CT scan and the patient can be discharged home if he continues to remain stable. Hospitalist  was asked to admit patient for further evaluation and management.  Review of Systems: Review of systems as noted in the HPI. All other systems reviewed and are negative.   Past Medical History:  Diagnosis Date   Arthritis    hands   Coronary artery disease    a. s/p CABG in 2008 at [facility] b. re-do CABG at [facility] in 03/2022 with left RA-RI and SVG-PDA   Diabetes mellitus without complication ([name])    GERD (gastroesophageal reflux disease)    H/O poliomyelitis    age 77.  now has "weak stomach muscles"   Hypertension    IBS (irritable bowel syndrome)    Iron deficiency anemia 09/22/2018   Myocardial infarct [facility])    Renal insufficiency    Thyroid disease    Past Surgical History:  Procedure Laterality Date   BACK SURGERY  1967   BACK SURGERY  10/04/2005   benign tumor removal  2004   BIOPSY  11/18/2022   Procedure: BIOPSY;  Surgeon: Lanelle Bal, DO;  Location: [facility] ENDO SUITE;  Service: Endoscopy;;   COLONOSCOPY WITH PROPOFOL N/A 11/18/2022   Procedure: COLONOSCOPY WITH PROPOFOL;  Surgeon: Lanelle Bal, DO;  Location: [facility] ENDO SUITE;  Service: Endoscopy;  Laterality: N/A;  8:30 am, pt did not want early appt. moved down (per PAT)   CORONARY ARTERY BYPASS GRAFT  2008   [facility] - 3 vessel   CORONARY ARTERY BYPASS GRAFT N/A 03/28/2022   Procedure: REDO CORONARY ARTERY BYPASS GRAFTING (CABG) X2 BYPASSES USING OPEN LEFT RADIAL ARTERY AND ENDOSCOPIC RIGHT GREATER SAPHENOUS VEIN HARVEST;  Surgeon: Loreli Slot,  MD;  Location: [facility] OR;  Service: Open Heart Surgery;  Laterality: N/A;   CYSTOSCOPY WITH INSERTION OF UROLIFT     ESOPHAGOGASTRODUODENOSCOPY (EGD) WITH PROPOFOL N/A 11/18/2022   Procedure: ESOPHAGOGASTRODUODENOSCOPY (EGD) WITH PROPOFOL;  Surgeon: Lanelle Bal, DO;  Location: [facility] ENDO [facility];  Service: Endoscopy;  Laterality: N/A;   HERNIA REPAIR Bilateral    inguinal   HYDROCELE EXCISION / REPAIR     JOINT REPLACEMENT     left knee x2, rright x1   LEFT HEART  CATH AND CORS/GRAFTS ANGIOGRAPHY N/A 03/25/2022   Procedure: LEFT HEART CATH AND CORS/GRAFTS ANGIOGRAPHY;  Surgeon: Corky Crafts, MD;  Location: [facility] INVASIVE CV LAB;  Service: Cardiovascular;  Laterality: N/A;   POLYPECTOMY  11/18/2022   Procedure: POLYPECTOMY;  Surgeon: Lanelle Bal, DO;  Location: [facility] ENDO [facility];  Service: Endoscopy;;   PROSTATE SURGERY N/A 01/2021   TEE WITHOUT CARDIOVERSION N/A 03/28/2022   Procedure: TRANSESOPHAGEAL ECHOCARDIOGRAM (TEE);  Surgeon: Loreli Slot, MD;  Location: [facility] OR;  Service: Open Heart Surgery;  Laterality: N/A;   TONSILLECTOMY      Social History:  reports that he quit smoking about 26 years ago. His smoking use included cigarettes. He started smoking about 64 years ago. He has a 38 pack-year smoking history. He has never used smokeless tobacco. He reports current alcohol use. He reports that he does not use drugs.   Allergies  Allergen Reactions   Myrbetriq [Mirabegron] Nausea And Vomiting   Jardiance [Empagliflozin]     Pt does not like this medication   Niacin And Related Itching   Nsaids     Kidney damage    Other Diarrhea and Nausea And Vomiting    PT does not want OPIOIDS   GLP-1 drugs - Nausea, vomiting, diarrhea, belching   Statins Itching    Family History  Problem Relation Age of Onset   Hypertension Mother    Heart attack Mother    Osteoarthritis Mother    Stroke Father    Hypertension Father    Stroke Sister    Stroke Sister    Colon cancer Neg Hx    Prostate cancer Neg Hx    Cancer - Lung Neg Hx      Prior to Admission medications   Medication Sig Start Date End Date Taking? Authorizing Provider  acetaminophen (TYLENOL) 650 MG CR tablet Take 1,300 mg by mouth every 8 (eight) hours as needed for pain.   Yes [provider]  allopurinol (ZYLOPRIM) 100 MG tablet Take 1 tablet (100 mg total) by mouth daily. 10/22/21  Yes Paseda, Baird Kay, FNP  aspirin EC 81 MG tablet Take 81 mg by mouth daily.    Yes [provider]  Carboxymethylcellulose Sodium (THERATEARS PF OP) Place 1 drop into both eyes daily as needed (dry eyes).   Yes [provider]  carvedilol (COREG) 6.25 MG tablet Take 1 tablet (6.25 mg total) by mouth 2 (two) times daily. 10/28/22  Yes Maisie Fus, MD  Cholecalciferol (D3 2000) 50 MCG (2000 UT) CAPS Take 1 capsule by mouth daily.   Yes [provider]  clotrimazole-betamethasone (LOTRISONE) cream Apply 1 Application topically 2 (two) times daily as needed (rash).   Yes [provider]  cyanocobalamin (VITAMIN B12) 1000 MCG tablet Take 1,000 mcg by mouth daily.   Yes [provider]  Evolocumab (REPATHA SURECLICK) 140 MG/ML SOAJ INJECT 140 MG INTO THE SKIN EVERY 14 (FOURTEEN) DAYS. 03/31/23  Yes Maisie Fus, MD  ezetimibe (ZETIA) 10 MG tablet TAKE 1 TABLET BY MOUTH EVERY DAY 09/18/22  Yes Maisie Fus, MD  glipiZIDE (GLUCOTROL XL) 5 MG 24 hr tablet Take 2 tablets (10 mg total) by mouth daily with breakfast. 05/28/23  Yes Nida, Denman George, MD  hydrochlorothiazide (HYDRODIURIL) 25 MG tablet TAKE 1 TABLET (25 MG TOTAL) BY MOUTH DAILY. 05/26/23  Yes BranchAlben Spittle, MD  insulin degludec (TRESIBA FLEXTOUCH) 100 UNIT/ML FlexTouch Pen Inject 35 Units into the skin at bedtime. 04/25/23  Yes Roma Kayser, MD  ketoconazole (NIZORAL) 2 % cream APPLY 1 APPLICATION TOPICALLY DAILY 06/10/23  Yes Billie Lade, MD  levothyroxine (SYNTHROID) 100 MCG tablet TAKE 1 TABLET BY MOUTH EVERY DAY BEFORE BREAKFAST Patient taking differently: Take 100 mcg by mouth daily before breakfast. 03/21/23  Yes Gilmore Laroche, FNP  metFORMIN (GLUCOPHAGE) 1000 MG tablet TAKE 1 TABLET (1,000 MG TOTAL) BY MOUTH TWICE A DAY WITH FOOD Patient taking differently: Take 1,000 mg by mouth 2 (two) times daily with a meal. 01/23/23  Yes Nida, Denman George, MD  pantoprazole (PROTONIX) 40 MG tablet Take 1 tablet (40 mg total) by mouth daily. 03/17/23  Yes Billie Lade, MD  tadalafil (CIALIS) 5 MG tablet Take 5 mg by mouth daily.   Yes [provider]  triamcinolone cream (KENALOG) 0.5 % Apply 1 Application topically 2 (two) times daily as needed (tick bites).   Yes [provider]  valACYclovir (VALTREX) 500 MG tablet TAKE 1 TABLET BY MOUTH TWICE A DAY 05/01/23  Yes Gilmore Laroche, FNP    Physical Exam: BP 139/85   Pulse (!) 57   Temp 98.3 F (36.8 C) (Oral)   Resp 13   Ht 5\' 6"  (1.676 m)   Wt 89.8 kg   SpO2 97%   BMI 31.96 kg/m   General: 77 y.o. year-old male well developed well nourished in no acute distress.  Alert and oriented x3. HEENT: NCAT, EOMI Neck: Supple, trachea medial Cardiovascular: Regular rate and rhythm with no rubs or gallops.  No thyromegaly or JVD noted.  No lower extremity edema. 2/4 pulses in all 4 extremities. Respiratory: Clear to auscultation with no wheezes or rales. Good inspiratory effort. Abdomen: Soft, nontender nondistended with normal bowel sounds x4 quadrants. Muskuloskeletal: Mild tenderness to palpation of left upper back.  Left ankle noted in Ace wrap.  No cyanosis, clubbing noted bilaterally Neuro: CN II-XII intact, strength 5/5 x 4, sensation, reflexes intact Skin: No ulcerative lesions noted or rashes Psychiatry: Judgement and insight appear normal. Mood is appropriate for condition and setting          Labs on Admission:  Basic Metabolic Panel: Recent Labs  Lab 06/21/23 1655  NA 133*  K 3.7  CL 101  CO2 24  GLUCOSE 123*  BUN 16  CREATININE 1.24  CALCIUM 8.8*   Liver Function Tests: No results for input(s): "AST", "ALT", "ALKPHOS", "BILITOT", "PROT", "ALBUMIN" in the last 168 hours. No results for input(s): "LIPASE", "AMYLASE" in the last 168 hours. No results for input(s): "AMMONIA" in the last 168 hours. CBC: Recent Labs  Lab 06/21/23 1655  WBC 5.0  NEUTROABS 2.9  HGB 11.5*  HCT 34.8*  MCV 88.5  PLT 155   Cardiac Enzymes: Recent Labs  Lab  06/21/23 1655  CKTOTAL 34*  CKMB 2.2    BNP (last 3 results) Recent Labs    07/17/22 1514 02/13/23 1011  BNP 64.2 154.0*    ProBNP (last 3 results) No results  for input(s): "PROBNP" in the last 8760 hours.  CBG: Recent Labs  Lab 06/21/23 2010  GLUCAP 57*    Radiological Exams on Admission: DG Chest Port 1 View  Result Date: 06/21/2023 CLINICAL DATA:  Trauma EXAM: PORTABLE CHEST 1 VIEW COMPARISON:  02/13/2023 FINDINGS: Cardiomegaly status post median sternotomy and CABG. Unchanged small, chronic pulmonary herniation of the lingula, better assessed by recent CT. No acute airspace opacity. IMPRESSION: 1. No acute airspace opacity. 2. Cardiomegaly status post median sternotomy and CABG. 3. Unchanged small, chronic pulmonary herniation of the lingula, better assessed by recent CT. Electronically Signed   By: Jearld Lesch M.D.   On: 06/21/2023 17:21   CT Head Wo Contrast  Result Date: 06/21/2023 CLINICAL DATA:  Head trauma, minor, age greater than 52. Fall. Slipped on steps and fell backwards striking head. EXAM: CT HEAD WITHOUT CONTRAST TECHNIQUE: Contiguous axial images were obtained from the base of the skull through the vertex without intravenous contrast. RADIATION DOSE REDUCTION: This exam was performed according to the departmental dose-optimization program which includes automated exposure control, adjustment of the mA and/or kV according to patient size and/or use of iterative reconstruction technique. COMPARISON:  CT head without contrast 06/11/2022. FINDINGS: Brain: Acute hemorrhage along the right interhemispheric falx partially effaces the sulci of the medial posterior right frontal lobe. No other significant mass effect associated. The hemorrhage measures up to 3.5 mm in thickness. It extends cephalo caudad 27 mm and anterior-posterior 33 mm. No acute infarct is present. Mild atrophy and white matter disease is stable. Deep brain nuclei are within normal limits. Remote lacunar  infarcts involving the thalami bilaterally are stable. The brainstem and cerebellum are within normal limits. Midline structures are within normal limits. Vascular: Atherosclerotic calcifications are present within the cavernous internal carotid arteries and at the dural margin of both vertebral arteries. No hyperdense vessel is present. Skull: Calvarium is intact. No focal lytic or blastic lesions are present. Soft tissue swelling is present in the left suboccipital scalp. Sinuses/Orbits: The paranasal sinuses and mastoid air cells are clear. Bilateral lens replacements are noted. Globes and orbits are otherwise unremarkable. IMPRESSION: 1. Acute hemorrhage along the right interhemispheric falx partially effaces the sulci of the medial posterior right frontal lobe. No other significant mass effect associated. 2. Stable atrophy and white matter disease. This likely reflects the sequela of chronic microvascular ischemia. 3. Remote lacunar infarcts involving the thalami bilaterally are stable. 4. Soft tissue swelling in the left suboccipital scalp without underlying fracture. These results were called by telephone at the time of interpretation on 06/21/2023 at 4:26 pm to provider Edwin Dada , who verbally acknowledged these results. Electronically Signed   By: Marin Roberts M.D.   On: 06/21/2023 16:27   DG HIP UNILAT WITH PELVIS 2-3 VIEWS LEFT  Result Date: 06/21/2023 CLINICAL DATA:  Fall, left hip pain. EXAM: DG HIP (WITH OR WITHOUT PELVIS) 2-3V LEFT COMPARISON:  CT pelvis 06/11/2022 FINDINGS: Postoperative findings in the lower lumbar spine and upper sacrum. Fiducials along the prostate gland. Mild axial loss of articular space in the hips, likely degenerative. No cortical discontinuity to indicate left hip/proximal femur fracture. IMPRESSION: 1. No acute fracture is identified. 2. Mild degenerative axial loss of articular space in the hips. 3. Postoperative findings in the lower lumbar spine and upper  sacrum. Electronically Signed   By: Gaylyn Rong M.D.   On: 06/21/2023 16:26   DG Ankle Complete Left  Result Date: 06/21/2023 CLINICAL DATA:  Fall on a loose  step, twisting ankle injury. Left ankle pain. EXAM: LEFT ANKLE COMPLETE - 3+ VIEW COMPARISON:  Left foot radiographs from 01/29/2013 FINDINGS: Linear metal subcutaneous clip noted posteromedial to the distal tibia, no change from 2014. Small apparently well corticated ossicle below the medial malleolus, probably incidental or related to a remote injury. Plafond and talar dome appear intact. Lateral malleolus appears normal. Atheromatous vascular calcification in the distal calf. Plantar calcaneal spur. Mild dorsal midfoot spurring. IMPRESSION: 1. No acute fracture or dislocation is identified. 2. Small well corticated ossicle below the medial malleolus, probably incidental or related to a remote injury. 3. Plantar calcaneal spur. 4. Atheromatous vascular calcification in the distal calf. Electronically Signed   By: Gaylyn Rong M.D.   On: 06/21/2023 16:23    EKG: I independently viewed the EKG done and my findings are as followed: EKG was not done in the ED  Assessment/Plan Present on Admission:  Subdural hematoma ([name])  CAD (coronary artery disease), native coronary artery  GERD (gastroesophageal reflux disease)  Acquired hypothyroidism  Mixed hyperlipidemia  Essential hypertension  Principal Problem:   Subdural hematoma ([name]) Active Problems:   CAD (coronary artery disease), native coronary artery   Essential hypertension   Acquired hypothyroidism   Uncontrolled type 2 diabetes mellitus with hypoglycemia, with long-term current use of insulin ([name])   GERD (gastroesophageal reflux disease)   Mixed hyperlipidemia   Fall at home, initial encounter   Sprained ankle   Hypertensive urgency  Subdural hematoma CT head without contrast showed acute hemorrhage along the right interhemispheric falx partially effacing the sulci  of the medial posterior right frontal lobe Neurosurgery was consulted and recommended that patient can be admitted to [facility] and that he can be discharged home if he continues to remain stable per EDP CT of head without contrast will be repeated 6 hours after initial CT of head to ensure no worsening of intracranial hemorrhage  Fall Left sprained ankle Continue fall precaution Left ankle with Ace wrap Continue Tylenol as needed Continue PT/OT eval and treat  Type 2 diabetes mellitus with hypoglycemia Continue CBG and hypoglycemic protocol Metformin, glipizide will be held at this time Consider starting patient back on insulin once blood glucose returns to being hyperglycemic  Hypertensive urgency Essential hypertension Continue IV hydralazine 10 mg every 6 hours as needed Continue home Coreg  Mixed hyperlipidemia Continue Zetia  CAD status post CABG Continue aspirin, Coreg  GERD Continue Protonix  Acquired hypothyroidism Continue Synthroid  Obesity (BMI 31.96) Diet and lifestyle modification  DVT prophylaxis: SCDs   Advance Care Planning: CODE STATUS: Full code  Consults: None  Family Communication: None at bedside  Severity of Illness: The appropriate patient status for this patient is OBSERVATION. Observation status is judged to be reasonable and necessary in order to provide the required intensity of service to ensure the patient's safety. The patient's presenting symptoms, physical exam findings, and initial radiographic and laboratory data in the context of their medical condition is felt to place them at decreased risk for further clinical deterioration. Furthermore, it is anticipated that the patient will be medically stable for discharge from the hospital within 2 midnights of admission.   Author: Frankey Shown, DO 06/21/2023 9:19 PM  For on call review [email or web address].

## 2023-06-21 NOTE — ED Notes (Signed)
HA remains the same, unchanged. Denies nausea.

## 2023-06-21 NOTE — ED Provider Notes (Signed)
[place] EMERGENCY DEPARTMENT AT [facility] Provider Note   CSN: [id number] Arrival date & time: 06/21/23  1522     History  Chief Complaint  Patient presents with   Marletta Lor    Ryan Herrera is a 77 y.o. male.  Patient is a 77 yo male presenting for head with head trauma. Pt admits to falling from a step (about 2 feet off the ground) today at 3PM. States he fell backwards and hit his head and has pain in his back left side, left hip, and left ankle. Denies blood thinner use. Takes an ASA daily. Denies LOC. Denies headaches, nausea, vomiting, or sensation/motor dysfunction.   The history is provided by the patient. No language interpreter was used.  Fall Pertinent negatives include no chest pain, no abdominal pain and no shortness of breath.       Home Medications Prior to Admission medications   Medication Sig Start Date End Date Taking? Authorizing Provider  acetaminophen (TYLENOL) 650 MG CR tablet Take 1,300 mg by mouth every 8 (eight) hours as needed for pain.   Yes [provider]  allopurinol (ZYLOPRIM) 100 MG tablet Take 1 tablet (100 mg total) by mouth daily. 10/22/21  Yes Paseda, Baird Kay, FNP  aspirin EC 81 MG tablet Take 81 mg by mouth daily.   Yes [provider]  Carboxymethylcellulose Sodium (THERATEARS PF OP) Place 1 drop into both eyes daily as needed (dry eyes).   Yes [provider]  carvedilol (COREG) 6.25 MG tablet Take 1 tablet (6.25 mg total) by mouth 2 (two) times daily. 10/28/22  Yes Maisie Fus, MD  Cholecalciferol (D3 2000) 50 MCG (2000 UT) CAPS Take 1 capsule by mouth daily.   Yes [provider]  clotrimazole-betamethasone (LOTRISONE) cream Apply 1 Application topically 2 (two) times daily as needed (rash).   Yes [provider]  cyanocobalamin (VITAMIN B12) 1000 MCG tablet Take 1,000 mcg by mouth daily.   Yes [provider]  Evolocumab (REPATHA SURECLICK) 140 MG/ML SOAJ INJECT  140 MG INTO THE SKIN EVERY 14 (FOURTEEN) DAYS. 03/31/23  Yes Maisie Fus, MD  ezetimibe (ZETIA) 10 MG tablet TAKE 1 TABLET BY MOUTH EVERY DAY 09/18/22  Yes Maisie Fus, MD  glipiZIDE (GLUCOTROL XL) 5 MG 24 hr tablet Take 2 tablets (10 mg total) by mouth daily with breakfast. 05/28/23  Yes Nida, Denman George, MD  hydrochlorothiazide (HYDRODIURIL) 25 MG tablet TAKE 1 TABLET (25 MG TOTAL) BY MOUTH DAILY. 05/26/23  Yes BranchAlben Spittle, MD  insulin degludec (TRESIBA FLEXTOUCH) 100 UNIT/ML FlexTouch Pen Inject 35 Units into the skin at bedtime. 04/25/23  Yes Roma Kayser, MD  ketoconazole (NIZORAL) 2 % cream APPLY 1 APPLICATION TOPICALLY DAILY 06/10/23  Yes Billie Lade, MD  levothyroxine (SYNTHROID) 100 MCG tablet TAKE 1 TABLET BY MOUTH EVERY DAY BEFORE BREAKFAST Patient taking differently: Take 100 mcg by mouth daily before breakfast. 03/21/23  Yes Gilmore Laroche, FNP  metFORMIN (GLUCOPHAGE) 1000 MG tablet TAKE 1 TABLET (1,000 MG TOTAL) BY MOUTH TWICE A DAY WITH FOOD Patient taking differently: Take 1,000 mg by mouth 2 (two) times daily with a meal. 01/23/23  Yes Nida, Denman George, MD  pantoprazole (PROTONIX) 40 MG tablet Take 1 tablet (40 mg total) by mouth daily. 03/17/23  Yes Billie Lade, MD  tadalafil (CIALIS) 5 MG tablet Take 5 mg by mouth daily.   Yes [provider]  triamcinolone cream (KENALOG) 0.5 % Apply 1 Application  topically 2 (two) times daily as needed (tick bites).   Yes [provider]  valACYclovir (VALTREX) 500 MG tablet TAKE 1 TABLET BY MOUTH TWICE A DAY 05/01/23  Yes Gilmore Laroche, FNP      Allergies    Myrbetriq [mirabegron], Jardiance [empagliflozin], Niacin and related, Nsaids, Other, and Statins    Review of Systems   Review of Systems  Constitutional:  Negative for chills and fever.  HENT:  Negative for ear pain and sore throat.   Eyes:  Negative for pain and visual disturbance.  Respiratory:  Negative for cough and shortness of  breath.   Cardiovascular:  Negative for chest pain and palpitations.  Gastrointestinal:  Negative for abdominal pain and vomiting.  Genitourinary:  Negative for dysuria and hematuria.  Musculoskeletal:  Negative for arthralgias and back pain.  Skin:  Positive for wound. Negative for color change and rash.  Neurological:  Negative for seizures and syncope.  All other systems reviewed and are negative.   Physical Exam Updated Vital Signs BP (!) 164/81   Pulse (!) 54   Temp 97.8 F (36.6 C) (Oral)   Resp 16   Ht 5\' 6"  (1.676 m)   Wt 89.8 kg   SpO2 97%   BMI 31.96 kg/m  Physical Exam Vitals and nursing note reviewed.  Constitutional:      General: He is not in acute distress.    Appearance: He is well-developed.  HENT:     Head: Normocephalic and atraumatic.  Eyes:     General: Lids are normal. Vision grossly intact.     Conjunctiva/sclera: Conjunctivae normal.     Pupils: Pupils are equal, round, and reactive to light.  Cardiovascular:     Rate and Rhythm: Normal rate and regular rhythm.     Heart sounds: No murmur heard. Pulmonary:     Effort: Pulmonary effort is normal. No respiratory distress.     Breath sounds: Normal breath sounds.  Abdominal:     Palpations: Abdomen is soft.     Tenderness: There is no abdominal tenderness.  Musculoskeletal:        General: No swelling.     Right shoulder: Normal.     Left shoulder: Normal.     Right upper arm: Normal.     Left upper arm: Normal.     Right elbow: Normal.     Left elbow: Normal.     Right forearm: Normal.     Left forearm: Normal.     Right wrist: Normal.     Left wrist: Normal.     Right hand: Normal.     Left hand: Normal.     Cervical back: Neck supple. Tenderness present. No bony tenderness.     Thoracic back: No bony tenderness.     Lumbar back: No bony tenderness.       Back:     Right hip: Normal.     Left hip: Normal.     Right upper leg: Normal.     Left upper leg: Normal.     Right knee:  Normal.     Left knee: Normal.     Right lower leg: Normal.     Left lower leg: Normal.     Right ankle: No swelling, deformity or ecchymosis. Normal pulse.     Right Achilles Tendon: No tenderness.     Left ankle: No swelling, deformity or ecchymosis. Normal pulse.     Left Achilles Tendon: Tenderness present.  Skin:  General: Skin is warm and dry.     Capillary Refill: Capillary refill takes less than 2 seconds.  Neurological:     General: No focal deficit present.     Mental Status: He is alert and oriented to person, place, and time.     GCS: GCS eye subscore is 4. GCS verbal subscore is 5. GCS motor subscore is 6.     Cranial Nerves: Cranial nerves 2-12 are intact.     Sensory: Sensation is intact.     Motor: Motor function is intact.     Coordination: Coordination is intact.  Psychiatric:        Mood and Affect: Mood normal.     ED Results / Procedures / Treatments   Labs (all labs ordered are listed, but only abnormal results are displayed) Labs Reviewed  CBC WITH DIFFERENTIAL/PLATELET - Abnormal; Notable for the following components:      Result Value   RBC 3.93 (*)    Hemoglobin 11.5 (*)    HCT 34.8 (*)    All other components within normal limits  BASIC METABOLIC PANEL - Abnormal; Notable for the following components:   Sodium 133 (*)    Glucose, Bld 123 (*)    Calcium 8.8 (*)    GFR, Estimated 59 (*)    All other components within normal limits  APTT  PROTIME-INR  CK TOTAL AND CKMB (NOT AT [facility])  TYPE AND SCREEN    EKG None  Radiology DG Chest Port 1 View  Result Date: 06/21/2023 CLINICAL DATA:  Trauma EXAM: PORTABLE CHEST 1 VIEW COMPARISON:  02/13/2023 FINDINGS: Cardiomegaly status post median sternotomy and CABG. Unchanged small, chronic pulmonary herniation of the lingula, better assessed by recent CT. No acute airspace opacity. IMPRESSION: 1. No acute airspace opacity. 2. Cardiomegaly status post median sternotomy and CABG. 3. Unchanged small,  chronic pulmonary herniation of the lingula, better assessed by recent CT. Electronically Signed   By: Jearld Lesch M.D.   On: 06/21/2023 17:21   CT Head Wo Contrast  Result Date: 06/21/2023 CLINICAL DATA:  Head trauma, minor, age greater than 71. Fall. Slipped on steps and fell backwards striking head. EXAM: CT HEAD WITHOUT CONTRAST TECHNIQUE: Contiguous axial images were obtained from the base of the skull through the vertex without intravenous contrast. RADIATION DOSE REDUCTION: This exam was performed according to the departmental dose-optimization program which includes automated exposure control, adjustment of the mA and/or kV according to patient size and/or use of iterative reconstruction technique. COMPARISON:  CT head without contrast 06/11/2022. FINDINGS: Brain: Acute hemorrhage along the right interhemispheric falx partially effaces the sulci of the medial posterior right frontal lobe. No other significant mass effect associated. The hemorrhage measures up to 3.5 mm in thickness. It extends cephalo caudad 27 mm and anterior-posterior 33 mm. No acute infarct is present. Mild atrophy and white matter disease is stable. Deep brain nuclei are within normal limits. Remote lacunar infarcts involving the thalami bilaterally are stable. The brainstem and cerebellum are within normal limits. Midline structures are within normal limits. Vascular: Atherosclerotic calcifications are present within the cavernous internal carotid arteries and at the dural margin of both vertebral arteries. No hyperdense vessel is present. Skull: Calvarium is intact. No focal lytic or blastic lesions are present. Soft tissue swelling is present in the left suboccipital scalp. Sinuses/Orbits: The paranasal sinuses and mastoid air cells are clear. Bilateral lens replacements are noted. Globes and orbits are otherwise unremarkable. IMPRESSION: 1. Acute hemorrhage along the right interhemispheric  falx partially effaces the sulci of  the medial posterior right frontal lobe. No other significant mass effect associated. 2. Stable atrophy and white matter disease. This likely reflects the sequela of chronic microvascular ischemia. 3. Remote lacunar infarcts involving the thalami bilaterally are stable. 4. Soft tissue swelling in the left suboccipital scalp without underlying fracture. These results were called by telephone at the time of interpretation on 06/21/2023 at 4:26 pm to provider Edwin Dada , who verbally acknowledged these results. Electronically Signed   By: Marin Roberts M.D.   On: 06/21/2023 16:27   DG HIP UNILAT WITH PELVIS 2-3 VIEWS LEFT  Result Date: 06/21/2023 CLINICAL DATA:  Fall, left hip pain. EXAM: DG HIP (WITH OR WITHOUT PELVIS) 2-3V LEFT COMPARISON:  CT pelvis 06/11/2022 FINDINGS: Postoperative findings in the lower lumbar spine and upper sacrum. Fiducials along the prostate gland. Mild axial loss of articular space in the hips, likely degenerative. No cortical discontinuity to indicate left hip/proximal femur fracture. IMPRESSION: 1. No acute fracture is identified. 2. Mild degenerative axial loss of articular space in the hips. 3. Postoperative findings in the lower lumbar spine and upper sacrum. Electronically Signed   By: Gaylyn Rong M.D.   On: 06/21/2023 16:26   DG Ankle Complete Left  Result Date: 06/21/2023 CLINICAL DATA:  Fall on a loose step, twisting ankle injury. Left ankle pain. EXAM: LEFT ANKLE COMPLETE - 3+ VIEW COMPARISON:  Left foot radiographs from 01/29/2013 FINDINGS: Linear metal subcutaneous clip noted posteromedial to the distal tibia, no change from 2014. Small apparently well corticated ossicle below the medial malleolus, probably incidental or related to a remote injury. Plafond and talar dome appear intact. Lateral malleolus appears normal. Atheromatous vascular calcification in the distal calf. Plantar calcaneal spur. Mild dorsal midfoot spurring. IMPRESSION: 1. No acute  fracture or dislocation is identified. 2. Small well corticated ossicle below the medial malleolus, probably incidental or related to a remote injury. 3. Plantar calcaneal spur. 4. Atheromatous vascular calcification in the distal calf. Electronically Signed   By: Gaylyn Rong M.D.   On: 06/21/2023 16:23    Procedures .Critical Care  Performed by: Franne Forts, DO Authorized by: Franne Forts, DO   Critical care provider statement:    Critical care time (minutes):  101   Critical care was necessary to treat or prevent imminent or life-threatening deterioration of the following conditions:  Trauma   Critical care was time spent personally by me on the following activities:  Development of treatment plan with patient or surrogate, discussions with consultants, evaluation of patient's response to treatment, examination of patient, ordering and review of laboratory studies, ordering and review of radiographic studies, ordering and performing treatments and interventions, pulse oximetry, re-evaluation of patient's condition and review of old charts   Care discussed with: admitting provider       Medications Ordered in ED Medications  fentaNYL (SUBLIMAZE) injection 50 mcg (50 mcg Intravenous Not Given 06/21/23 1648)  0.9 %  sodium chloride infusion (has no administration in time range)    ED Course/ Medical Decision Making/ A&P                                 Medical Decision Making Amount and/or Complexity of Data Reviewed Labs: ordered. Radiology: ordered.  Risk Prescription drug management. Decision regarding hospitalization.    77 yo male presenting for head with head trauma. Patietn is Aox3, no acute distress,  neurovascularly intact, with stable vitals. CT head demonstrates:  1. Acute hemorrhage along the right interhemispheric falx partially effaces the sulci of the medial posterior right frontal lobe. No other significant mass effect associated. 2. Stable atrophy and  white matter disease. This likely reflects the sequela of chronic microvascular ischemia. 3. Remote lacunar infarcts involving the thalami bilaterally are stable. 4. Soft tissue swelling in the left suboccipital scalp without underlying fracture.  Ankle films negative for acute fracture. Hip films stable. Ace wrap and ice applied to ankle.   Consult to neurosurgery pending.  6:07 PM Neurosurgery feels that patient is appropriate to stay at any [facility] given the stable neurological status and CT scan.  Pt accepted for observation at [facility] by Dr. Thomes Dinning.         Final Clinical Impression(s) / ED Diagnoses Final diagnoses:  Subdural hematoma ([name])  Fall, initial encounter  Traumatic injury of head, initial encounter  Acute left ankle pain    Rx / DC Orders ED Discharge Orders     None         Franne Forts, DO 06/21/23 1807

## 2023-06-21 NOTE — ED Notes (Signed)
Pt remains alert, NAD, calm, interactive, appropriate, cooperative. Speaking with med rec tech at [facility] now. Family at [facility]. VSS. Monitoring BP.

## 2023-06-21 NOTE — ED Triage Notes (Signed)
Stepped on loose step  Foot and leg went between step and building  LEFT leg twisted ankle Fell backwards hit head Upper back pain, LEFT hip and ankle pain  No LOC No thinners.

## 2023-06-22 DIAGNOSIS — E782 Mixed hyperlipidemia: Secondary | ICD-10-CM

## 2023-06-22 DIAGNOSIS — E11649 Type 2 diabetes mellitus with hypoglycemia without coma: Secondary | ICD-10-CM | POA: Diagnosis not present

## 2023-06-22 DIAGNOSIS — I2510 Atherosclerotic heart disease of native coronary artery without angina pectoris: Secondary | ICD-10-CM | POA: Diagnosis not present

## 2023-06-22 DIAGNOSIS — S065X0A Traumatic subdural hemorrhage without loss of consciousness, initial encounter: Secondary | ICD-10-CM | POA: Diagnosis not present

## 2023-06-22 DIAGNOSIS — S93492D Sprain of other ligament of left ankle, subsequent encounter: Secondary | ICD-10-CM | POA: Diagnosis not present

## 2023-06-22 DIAGNOSIS — K219 Gastro-esophageal reflux disease without esophagitis: Secondary | ICD-10-CM | POA: Diagnosis not present

## 2023-06-22 DIAGNOSIS — Z794 Long term (current) use of insulin: Secondary | ICD-10-CM

## 2023-06-22 DIAGNOSIS — I10 Essential (primary) hypertension: Secondary | ICD-10-CM | POA: Diagnosis not present

## 2023-06-22 DIAGNOSIS — S065XAA Traumatic subdural hemorrhage with loss of consciousness status unknown, initial encounter: Secondary | ICD-10-CM

## 2023-06-22 LAB — COMPREHENSIVE METABOLIC PANEL
ALT: 37 U/L (ref 0–44)
AST: 28 U/L (ref 15–41)
Albumin: 3.1 g/dL — ABNORMAL LOW (ref 3.5–5.0)
Alkaline Phosphatase: 76 U/L (ref 38–126)
Anion gap: 9 (ref 5–15)
BUN: 15 mg/dL (ref 8–23)
CO2: 23 mmol/L (ref 22–32)
Calcium: 9 mg/dL (ref 8.9–10.3)
Chloride: 100 mmol/L (ref 98–111)
Creatinine, Ser: 1.04 mg/dL (ref 0.61–1.24)
GFR, Estimated: >60 mL/min (ref >60)
Glucose, Bld: 227 mg/dL — ABNORMAL HIGH (ref 70–99)
Potassium: 3.6 mmol/L (ref 3.5–5.1)
Sodium: 132 mmol/L — ABNORMAL LOW (ref 135–145)
Total Bilirubin: 0.5 mg/dL (ref 0.3–1.2)
Total Protein: 6.2 g/dL — ABNORMAL LOW (ref 6.5–8.1)

## 2023-06-22 LAB — CBC
HCT: 35.3 % — ABNORMAL LOW (ref 39.0–52.0)
Hemoglobin: 11.3 g/dL — ABNORMAL LOW (ref 13.0–17.0)
MCH: 28.6 pg (ref 26.0–34.0)
MCHC: 32 g/dL (ref 30.0–36.0)
MCV: 89.4 fL (ref 80.0–100.0)
Platelets: 138 10*3/uL — ABNORMAL LOW (ref 150–400)
RBC: 3.95 MIL/uL — ABNORMAL LOW (ref 4.22–5.81)
RDW: 13.8 % (ref 11.5–15.5)
WBC: 4.6 10*3/uL (ref 4.0–10.5)
nRBC: 0 % (ref 0.0–0.2)

## 2023-06-22 LAB — GLUCOSE, CAPILLARY
Glucose-Capillary: 222 mg/dL — ABNORMAL HIGH (ref 70–99)
Glucose-Capillary: 208 mg/dL — ABNORMAL HIGH (ref 70–99)
Glucose-Capillary: 325 mg/dL — ABNORMAL HIGH (ref 70–99)

## 2023-06-22 LAB — MRSA NEXT GEN BY PCR, NASAL: MRSA by PCR Next Gen: NOT DETECTED

## 2023-06-22 LAB — MAGNESIUM: Magnesium: 1.9 mg/dL (ref 1.7–2.4)

## 2023-06-22 LAB — PHOSPHORUS: Phosphorus: 3.8 mg/dL (ref 2.5–4.6)

## 2023-06-22 MED ORDER — ASPIRIN EC 81 MG PO TBEC: 81.0000 mg | DELAYED_RELEASE_TABLET | Freq: Every day | ORAL | Status: AC

## 2023-06-22 MED ORDER — INSULIN ASPART 100 UNIT/ML IJ SOLN: 0.0000 [IU] | Freq: Three times a day (TID) | INTRAMUSCULAR | Status: DC

## 2023-06-22 MED ORDER — INSULIN ASPART 100 UNIT/ML IJ SOLN
4.0000 [IU] | Freq: Once | INTRAMUSCULAR | Status: AC
  Administered 2023-06-22: 4 [IU] via SUBCUTANEOUS

## 2023-06-22 MED ORDER — METHOCARBAMOL 500 MG PO TABS: 500.0000 mg | ORAL_TABLET | Freq: Three times a day (TID) | ORAL | 0 refills | Status: DC | PRN

## 2023-06-22 MED ORDER — INSULIN ASPART 100 UNIT/ML IJ SOLN: 0.0000 [IU] | Freq: Every day | INTRAMUSCULAR | Status: DC

## 2023-06-22 NOTE — Progress Notes (Signed)
   06/22/23 0319  ECG Monitoring  QRS interval 0.05  QT interval 0.48  CV Strip Heart Rate 71  Cardiac Rhythm Heart block  Heart Block Type 2nd degree AVB (Mobitz II)  Provider Notification  Provider Name/Title Dr. Carren Rang  Date Provider Notified 06/22/23  Method of Notification Page  Notification Reason New onset of dysrhythmia  Type of New Onset of Dysrhythmia Second degree heart block (Mobitz II)  Symptoms of New Onset of Dysrhythmia Asymptomatic  Provider response See new orders  Date of Provider Response 06/22/23

## 2023-06-22 NOTE — Discharge Summary (Signed)
Physician Discharge Summary   Patient: Ryan Herrera MRN: [id number] DOB: March 20, 1946  Admit date:     06/21/2023  Discharge date: 06/22/23  Discharge Physician: Vassie Loll   PCP: Billie Lade, MD   Recommendations at discharge:  Repeat basic metabolic panel to follow electrolytes and renal function Repeat CBC to follow hemoglobin trend and stability. Continue to follow patient's blood pressure and further adjust antihypertensive regimen as required. Continue to assist patient with weight loss management.  Discharge Diagnoses: Principal Problem:   Subdural hematoma ([name]) Active Problems:   CAD (coronary artery disease), native coronary artery   Essential hypertension   Acquired hypothyroidism   Uncontrolled type 2 diabetes mellitus with hypoglycemia, with long-term current use of insulin ([name])   GERD (gastroesophageal reflux disease)   Mixed hyperlipidemia   Fall at home, initial encounter   Sprained ankle   Hypertensive urgency   Brief Hospital admission narrative: As per H&P written by Dr. Thomes Dinning on 06/21/2023  Ryan Herrera is a 77 y.o. male with medical history significant of hypertension, T2DM, CAD status post CABG hypothyroidism, GERD, hyperlipidemia who presents to the emergency department due to a fall.  Patient states that he fell off a step (about 2 feet off the ground) and fell backwards hitting his head and also sustained left-sided back pain.  He endorsed twisting his left ankle during the fall and also complained of left ankle pain.  He denies loss of consciousness or being on any blood thinner, but takes aspirin daily denies nausea, vomiting, numbness or tingling, chest pain, shortness of breath.   ED Course:  In the emergency department, he was hemodynamically stable.  Workup in the ED showed normocytic anemia.  BMP was normal except for sodium of 133, blood glucose 123. CT head without contrast showed acute hemorrhage along the right  interhemispheric falx partially effacing the sulci of the medial posterior right frontal lobe.  No other significant mass effect associated.  Stable atrophy and white matter disease. This likely reflects the sequela of chronic microvascular ischemia.  Remote lacunar infarcts involving the thalami bilaterally are stable. Soft tissues swelling in the left suboccipital scalp without underlying fracture.   Left hip x-ray showed no acute fracture Left ankle showed no acute fracture or dislocation Chest x-ray showed no acute airspace opacity IV fentanyl 50 mcg x 1 was given. Neurosurgery team at [facility] was consulted and stated that patient can stay at [facility] given the stable neurological status and CT scan and the patient can be discharged home if he continues to remain stable. Hospitalist was asked to admit patient for further evaluation and management.  Assessment and Plan: 1-subdural hematoma -CT head without contrast at time of admission demonstrating acute hemorrhage along the right anterior hemispheric falx partially effacing the sulci of the medial posterior right frontal lobe. -Repeat CT scan demonstrating no significant changes/abnormalities from previously reported.  There is no edema or shifting. -Patient advised to avoid the use of NSAIDs and to hold his aspirin for 1 week. -No neurologic deficits or complaints. -Tylenol to be used for pain.  2-mechanical fall -Left sprained ankle -No fractures appreciated on x-rays -Continue Tylenol, weightbearing as tolerated and the use of ankle brace for support. -Safe to return home.  3-type 2 diabetes mellitus with hyperglycemia -Recent A1c 9.2 -Actively followed by Dr. Fransico Him (endocrinologist) -Continue modified carbohydrate diet and resume home hypoglycemic regimen.  4-gastroesophageal reflux disease -Continue PPI.  5-hypertension -Heart healthy/low-sodium diet discussed with patient -Continue home antihypertensive agents -Continue  outpatient  follow-up patient blood pressure with further adjustment to his regimen as required.  6-coronary artery disease status post CABG -Continue Coreg and cholesterol control -No chest pain or shortness of breath -After 1 week safe to resume the use of aspirin.  7-hyperlipidemia -Continue zetia and biweekly injectable (Repatha) -Heart healthy diet discussed with patient.  8-class I obesity -Body mass index is 31.96 kg/m. -Low-calorie diet, portion control and increase physical activity discussed with patient.  9-hypothyroidism -Continue Synthroid.  Consultants: Neurosurgery Procedures performed: See below for x-ray reports. Disposition: Home Diet recommendation: Heart healthy/low-sodium and modified carbohydrate diet.  DISCHARGE MEDICATION: Allergies as of 06/22/2023       Reactions   Myrbetriq [mirabegron] Nausea And Vomiting   Jardiance [empagliflozin]    Pt does not like this medication   Niacin And Related Itching   Nsaids    Kidney damage    Other Diarrhea, Nausea And Vomiting   PT does not want OPIOIDS   GLP-1 drugs - Nausea, vomiting, diarrhea, belching   Statins Itching        Medication List     STOP taking these medications    triamcinolone cream 0.5 % Commonly known as: KENALOG       TAKE these medications    acetaminophen 650 MG CR tablet Commonly known as: TYLENOL Take 1,300 mg by mouth every 8 (eight) hours as needed for pain.   allopurinol 100 MG tablet Commonly known as: ZYLOPRIM Take 1 tablet (100 mg total) by mouth daily.   aspirin EC 81 MG tablet Take 1 tablet (81 mg total) by mouth daily. Start taking on: June 30, 2023 What changed: These instructions start on June 30, 2023. If you are unsure what to do until then, ask your doctor or other care provider.   carvedilol 6.25 MG tablet Commonly known as: COREG Take 1 tablet (6.25 mg total) by mouth 2 (two) times daily.   clotrimazole-betamethasone cream Commonly known as:  LOTRISONE Apply 1 Application topically 2 (two) times daily as needed (rash).   cyanocobalamin 1000 MCG tablet Commonly known as: VITAMIN B12 Take 1,000 mcg by mouth daily.   D3 2000 50 MCG (2000 UT) Caps Generic drug: Cholecalciferol Take 1 capsule by mouth daily.   ezetimibe 10 MG tablet Commonly known as: ZETIA TAKE 1 TABLET BY MOUTH EVERY DAY   glipiZIDE 5 MG 24 hr tablet Commonly known as: GLUCOTROL XL Take 2 tablets (10 mg total) by mouth daily with breakfast.   hydrochlorothiazide 25 MG tablet Commonly known as: HYDRODIURIL TAKE 1 TABLET (25 MG TOTAL) BY MOUTH DAILY.   ketoconazole 2 % cream Commonly known as: NIZORAL APPLY 1 APPLICATION TOPICALLY DAILY   levothyroxine 100 MCG tablet Commonly known as: SYNTHROID TAKE 1 TABLET BY MOUTH EVERY DAY BEFORE BREAKFAST What changed: See the new instructions.   metFORMIN 1000 MG tablet Commonly known as: GLUCOPHAGE TAKE 1 TABLET (1,000 MG TOTAL) BY MOUTH TWICE A DAY WITH FOOD What changed:  how much to take how to take this when to take this additional instructions   methocarbamol 500 MG tablet Commonly known as: ROBAXIN Take 1 tablet (500 mg total) by mouth every 8 (eight) hours as needed for muscle spasms.   pantoprazole 40 MG tablet Commonly known as: Protonix Take 1 tablet (40 mg total) by mouth daily.   Repatha SureClick 140 MG/ML Soaj Generic drug: Evolocumab INJECT 140 MG INTO THE SKIN EVERY 14 (FOURTEEN) DAYS.   tadalafil 5 MG tablet Commonly known as: CIALIS Take  5 mg by mouth daily.   THERATEARS PF OP Place 1 drop into both eyes daily as needed (dry eyes).   Evaristo Bury FlexTouch 100 UNIT/ML FlexTouch Pen Generic drug: insulin degludec Inject 35 Units into the skin at bedtime.   valACYclovir 500 MG tablet Commonly known as: VALTREX TAKE 1 TABLET BY MOUTH TWICE A DAY        Follow-up Information     Billie Lade, MD. Schedule an appointment as soon as possible for a visit in 1 week(s).    Specialty: Internal Medicine Contact information: [address] [place] [address] [phone]                Discharge Exam: Ceasar Mons Weights   06/21/23 1528  Weight: 89.8 kg   General exam: Alert, awake, oriented x 3; no overnight events; denying chest pain and shortness of breath. Respiratory system: Clear to auscultation. Respiratory effort normal.  Good saturation on room air. Cardiovascular system: Rate controlled; no rubs or gallops. Gastrointestinal system: Abdomen is obese, nondistended, soft and nontender. No organomegaly or masses felt. Normal bowel sounds heard. Central nervous system: No focal neurological deficits. Extremities: No cyanosis or clubbing. Skin: No petechiae. Psychiatry: Judgement and insight appear normal. Mood & affect appropriate.    Condition at discharge: Stable and improved.  The results of significant diagnostics from this hospitalization (including imaging, microbiology, ancillary and laboratory) are listed below for reference.   Imaging Studies: CT HEAD WO CONTRAST ( )  Result Date: 06/22/2023 CLINICAL DATA:  Subdural hematoma follow-up EXAM: CT HEAD WITHOUT CONTRAST TECHNIQUE: Contiguous axial images were obtained from the base of the skull through the vertex without intravenous contrast. RADIATION DOSE REDUCTION: This exam was performed according to the departmental dose-optimization program which includes automated exposure control, adjustment of the mA and/or kV according to patient size and/or use of iterative reconstruction technique. COMPARISON:  06/21/2023 FINDINGS: Brain: Unchanged parafalcine subdural hematoma that measures 4 mm in thickness and 4.5 cm in AP length. There is periventricular hypoattenuation compatible with chronic microvascular disease. No new site of hemorrhage. Vascular: Skull base atherosclerosis. Skull: Normal. Sinuses/Orbits: Paranasal sinuses are clear. No mastoid effusion. Normal orbits. Bilateral  ocular lens replacements. Other: None IMPRESSION: Unchanged parafalcine subdural hematoma. No new site of hemorrhage. Electronically Signed   By: Deatra Robinson M.D.   On: 06/22/2023 00:14   DG Chest Port 1 View  Result Date: 06/21/2023 CLINICAL DATA:  Trauma EXAM: PORTABLE CHEST 1 VIEW COMPARISON:  02/13/2023 FINDINGS: Cardiomegaly status post median sternotomy and CABG. Unchanged small, chronic pulmonary herniation of the lingula, better assessed by recent CT. No acute airspace opacity. IMPRESSION: 1. No acute airspace opacity. 2. Cardiomegaly status post median sternotomy and CABG. 3. Unchanged small, chronic pulmonary herniation of the lingula, better assessed by recent CT. Electronically Signed   By: Jearld Lesch M.D.   On: 06/21/2023 17:21   CT Head Wo Contrast  Result Date: 06/21/2023 CLINICAL DATA:  Head trauma, minor, age greater than 89. Fall. Slipped on steps and fell backwards striking head. EXAM: CT HEAD WITHOUT CONTRAST TECHNIQUE: Contiguous axial images were obtained from the base of the skull through the vertex without intravenous contrast. RADIATION DOSE REDUCTION: This exam was performed according to the departmental dose-optimization program which includes automated exposure control, adjustment of the mA and/or kV according to patient size and/or use of iterative reconstruction technique. COMPARISON:  CT head without contrast 06/11/2022. FINDINGS: Brain: Acute hemorrhage along the right interhemispheric falx partially effaces the  sulci of the medial posterior right frontal lobe. No other significant mass effect associated. The hemorrhage measures up to 3.5 mm in thickness. It extends cephalo caudad 27 mm and anterior-posterior 33 mm. No acute infarct is present. Mild atrophy and white matter disease is stable. Deep brain nuclei are within normal limits. Remote lacunar infarcts involving the thalami bilaterally are stable. The brainstem and cerebellum are within normal limits. Midline  structures are within normal limits. Vascular: Atherosclerotic calcifications are present within the cavernous internal carotid arteries and at the dural margin of both vertebral arteries. No hyperdense vessel is present. Skull: Calvarium is intact. No focal lytic or blastic lesions are present. Soft tissue swelling is present in the left suboccipital scalp. Sinuses/Orbits: The paranasal sinuses and mastoid air cells are clear. Bilateral lens replacements are noted. Globes and orbits are otherwise unremarkable. IMPRESSION: 1. Acute hemorrhage along the right interhemispheric falx partially effaces the sulci of the medial posterior right frontal lobe. No other significant mass effect associated. 2. Stable atrophy and white matter disease. This likely reflects the sequela of chronic microvascular ischemia. 3. Remote lacunar infarcts involving the thalami bilaterally are stable. 4. Soft tissue swelling in the left suboccipital scalp without underlying fracture. These results were called by telephone at the time of interpretation on 06/21/2023 at 4:26 pm to provider Edwin Dada , who verbally acknowledged these results. Electronically Signed   By: Marin Roberts M.D.   On: 06/21/2023 16:27   DG HIP UNILAT WITH PELVIS 2-3 VIEWS LEFT  Result Date: 06/21/2023 CLINICAL DATA:  Fall, left hip pain. EXAM: DG HIP (WITH OR WITHOUT PELVIS) 2-3V LEFT COMPARISON:  CT pelvis 06/11/2022 FINDINGS: Postoperative findings in the lower lumbar spine and upper sacrum. Fiducials along the prostate gland. Mild axial loss of articular space in the hips, likely degenerative. No cortical discontinuity to indicate left hip/proximal femur fracture. IMPRESSION: 1. No acute fracture is identified. 2. Mild degenerative axial loss of articular space in the hips. 3. Postoperative findings in the lower lumbar spine and upper sacrum. Electronically Signed   By: Gaylyn Rong M.D.   On: 06/21/2023 16:26   DG Ankle Complete Left  Result  Date: 06/21/2023 CLINICAL DATA:  Fall on a loose step, twisting ankle injury. Left ankle pain. EXAM: LEFT ANKLE COMPLETE - 3+ VIEW COMPARISON:  Left foot radiographs from 01/29/2013 FINDINGS: Linear metal subcutaneous clip noted posteromedial to the distal tibia, no change from 2014. Small apparently well corticated ossicle below the medial malleolus, probably incidental or related to a remote injury. Plafond and talar dome appear intact. Lateral malleolus appears normal. Atheromatous vascular calcification in the distal calf. Plantar calcaneal spur. Mild dorsal midfoot spurring. IMPRESSION: 1. No acute fracture or dislocation is identified. 2. Small well corticated ossicle below the medial malleolus, probably incidental or related to a remote injury. 3. Plantar calcaneal spur. 4. Atheromatous vascular calcification in the distal calf. Electronically Signed   By: Gaylyn Rong M.D.   On: 06/21/2023 16:23   CT ANGIO CHEST AORTA W/CM & OR WO/CM  Result Date: 05/29/2023 CLINICAL DATA:  Recheck ascending thoracic aortic aneurysm. EXAM: CT ANGIOGRAPHY CHEST WITH CONTRAST TECHNIQUE: Multidetector CT imaging of the chest was performed using the standard protocol during bolus administration of intravenous contrast. Multiplanar CT image reconstructions and MIPs were obtained to evaluate the vascular anatomy. RADIATION DOSE REDUCTION: This exam was performed according to the departmental dose-optimization program which includes automated exposure control, adjustment of the mA and/or kV according to patient size and/or use  of iterative reconstruction technique. CONTRAST:  75mL OMNIPAQUE IOHEXOL 350 MG/ML SOLN COMPARISON:  CT chest, abdomen and pelvis with IV contrast 06/11/2022, CT abdomen and pelvis with IV contrast 08/10/2018 FINDINGS: Cardiovascular: There are patchy calcific plaques along the aortic course, mild descending segment tortuosity. Additional scattered calcifications in the great vessels. There is no  flow-limiting stenosis. The ascending aorta was overestimated on the prior study due to being measured at an obliquity on the axial images. Maximum caliber of the ascending aorta is measured from today's exam S3 0.9 cm on coronal reconstruction series 8, image 76. Rest of the aorta is within 19 could you try at rest of aorta is within normal caliber limits. No dissection or penetrating ulcer. There is mild cardiomegaly. No pericardial effusion. There are sternotomy and CABG changes with heavily calcified native coronary arteries. The pulmonary veins are nondistended. Pulmonary arteries are normal caliber and centrally clear. Mediastinum/Nodes: 3.3 x 2.4 cm heterogeneous mass of the right lobe of the thyroid gland, underwent FNA 08/08/2022 with result of "atypia of undetermined significance", Bethesda category 3. Size measurements are unchanged. No other significant thyroid nodule is seen. Axillary spaces are clear. There is no intrathoracic adenopathy. The thoracic esophagus, thoracic trachea, and main bronchi are unremarkable. Lungs/Pleura: There are mild features of centrilobular emphysema in the lung apices. Coarse chronic changes are again noted in the lingular base as well as a few linear scar-like opacities in both bases. There is a calcified granuloma posteriorly in the right middle lobe on 7:93. Again noted is a 2 mm subpleural right upper lobe nodule on 7:62, unchanged. No active infiltrates or other nodules are seen. There is no pleural effusion, thickening or pneumothorax. Upper Abdomen: Cholelithiasis is again noted. Mild hepatic steatosis. No other significant or acute upper abdominal findings. Musculoskeletal: Multilevel thoracic spine bridging enthesopathy. There is a chronic compression fracture of the T3 vertebral body. No acute or other significant osseous findings. Unremarkable chest wall Review of the MIP images confirms the above findings. IMPRESSION: 1. Aortic atherosclerosis and tortuosity,  with borderline prominence in the ascending segment measuring 3.9 cm. No dissection or penetrating ulcer. Recommend annual imaging followup by CTA or MRA. This recommendation follows 2010 ACCF/AHA/AATS/ACR/ASA/ SCA/SCAI/SIR/STS/SVM Guidelines for the Diagnosis and Management of Patients with Thoracic Aortic Disease. Circulation.2010; 121: Y865-H846. Aortic aneurysm NOS (ICD10-I71.9). 2. Cardiomegaly without evidence of CHF. 3. Chronic changes in the lungs. 4. 3.3 x 2.4 cm heterogeneous mass of the right lobe of the thyroid gland, underwent FNA 08/08/2022 with result of atypia of undetermined significance, Bethesda category 3. Size measurements are unchanged. 5. Cholelithiasis. 6. Mild hepatic steatosis. 7. Chronic T3 vertebral body compression fracture. 8. Emphysema. Aortic Atherosclerosis (ICD10-I70.0) and Emphysema (ICD10-J43.9). Electronically Signed   By: Almira Bar M.D.   On: 05/29/2023 03:35    Microbiology: Results for orders placed or performed during the hospital encounter of 06/21/23  MRSA Next Gen by PCR, Nasal     Status: None   Collection Time: 06/21/23 10:45 PM   Specimen: Nasal Mucosa; Nasal Swab  Result Value Ref Range Status   MRSA by PCR Next Gen NOT DETECTED NOT DETECTED Final    Comment: (NOTE) The GeneXpert MRSA Assay (FDA approved for NASAL specimens only), is one component of a comprehensive MRSA colonization surveillance program. It is not intended to diagnose MRSA infection nor to guide or monitor treatment for MRSA infections. Test performance is not FDA approved in patients less than 33 years old. Performed at [facility], [address], [place], [place] [address]     Labs: CBC: Recent Labs  Lab 06/21/23 1655 06/22/23 0603  WBC 5.0 4.6  NEUTROABS 2.9  --   HGB 11.5* 11.3*  HCT 34.8* 35.3*  MCV 88.5 89.4  PLT 155 138*   Basic Metabolic Panel: Recent Labs  Lab 06/21/23 1655 06/22/23 0603  NA 133* 132*  K 3.7 3.6  CL 101 100  CO2 24 23  GLUCOSE  123* 227*  BUN 16 15  CREATININE 1.24 1.04  CALCIUM 8.8* 9.0  MG  --  1.9  PHOS  --  3.8   Liver Function Tests: Recent Labs  Lab 06/22/23 0603  AST 28  ALT 37  ALKPHOS 76  BILITOT 0.5  PROT 6.2*  ALBUMIN 3.1*   CBG: Recent Labs  Lab 06/21/23 2010 06/21/23 2150 06/22/23 0337 06/22/23 0745  GLUCAP 57* 160* 222* 208*    Discharge time spent: less than 30 minutes.  Signed: Vassie Loll, MD [facility] 06/22/2023

## 2023-06-22 NOTE — Plan of Care (Signed)
  Problem: Acute Rehab PT Goals(only PT should resolve) Goal: Pt Will Go Supine/Side To Sit Outcome: Progressing Flowsheets (Taken 06/22/2023 1116) Pt will go Supine/Side to Sit: with supervision Goal: Patient Will Transfer Sit To/From Stand Outcome: Progressing Flowsheets (Taken 06/22/2023 1116) Patient will transfer sit to/from stand: with supervision Goal: Pt Will Transfer Bed To Chair/Chair To Bed Outcome: Progressing Flowsheets (Taken 06/22/2023 1116) Pt will Transfer Bed to Chair/Chair to Bed: with supervision Goal: Pt Will Ambulate Outcome: Progressing Flowsheets (Taken 06/22/2023 1116) Pt will Ambulate:  100 feet  with least restrictive assistive device  with supervision

## 2023-06-22 NOTE — Progress Notes (Signed)
   06/21/23 2337  Point of Care Tests  CBG meter number  (pt refused 0000 CBG check)

## 2023-06-22 NOTE — Progress Notes (Addendum)
AVS gone over with pt. All questions answered. Pt taken to exit via wheelchair and left in a private vehicle with family.

## 2023-06-22 NOTE — Evaluation (Signed)
Physical Therapy Evaluation Patient Details Name: CORRIGAN WANEK MRN: [id number] DOB: [birth year] Today's Date: 06/22/2023  History of Present Illness  Ryan Herrera is a 77 y.o. male with medical history significant of hypertension, T2DM, CAD status post CABG hypothyroidism, GERD, hyperlipidemia who presents to the emergency department due to a fall.  Patient states that he fell off a step (about 2 feet off the ground) and fell backwards hitting his head and also sustained left-sided back pain.  He endorsed twisting his left ankle during the fall and also complained of left ankle pain.  He denies loss of consciousness or being on any blood thinner, but takes aspirin daily denies nausea, vomiting, numbness or tingling, chest pain, shortness of breath.    Clinical Impression  Patient lying in bed on therapist arrival.  Patient agreeable to therapist assessment.  Patient reports back pain > left ankle pain.  His left ankle is wrapped with an ace bandage.  Patient needs min A for supine to sit due to back pain to bring his trunk fully upright to sit on the edge of the bed.  Once sitting he demonstrates good sitting balance.  Patient is able to stand up at the side of the bed with CGA for safety and take a few steps over to the chair and sit down with good control; no loss of balance noted; only mild decreased stance left leg due to ankle pain.  patient left in chair with call button in reach and nursing notified of mobility status.  Patient will benefit from outpatient therapy services when he is discharged from the hospital to address back pain and left ankle sprain.           If plan is discharge home, recommend the following: Help with stairs or ramp for entrance;A little help with walking and/or transfers;A little help with bathing/dressing/bathroom   Can travel by private vehicle        Equipment Recommendations None recommended by PT  Recommendations for Other Services        Functional Status Assessment Patient has had a recent decline in their functional status and demonstrates the ability to make significant improvements in function in a reasonable and predictable amount of time.     Precautions / Restrictions Precautions Precautions: None Restrictions Weight Bearing Restrictions: No      Mobility  Bed Mobility Overal bed mobility: Needs Assistance Bed Mobility: Supine to Sit     Supine to sit: HOB elevated, Min assist     General bed mobility comments: min assist to bring trunk upright due to back pain    Transfers Overall transfer level: Needs assistance Equipment used: None Transfers: Sit to/from Stand Sit to Stand: Contact guard assist                Ambulation/Gait Ambulation/Gait assistance: Contact guard assist Gait Distance (Feet): 2 Feet Assistive device: None         General Gait Details: no loss of balance noted; mild decreased stance left leg  Stairs            Wheelchair Mobility     Tilt Bed    Modified Rankin (Stroke Patients Only)       Balance Overall balance assessment: Needs assistance Sitting-balance support: Feet supported Sitting balance-Leahy Scale: Good Sitting balance - Comments: good sitting balance on edge of bed   Standing balance support: No upper extremity supported Standing balance-Leahy Scale: Good Standing balance comment: good standing balance noted; good posturing  Pertinent Vitals/Pain Pain Assessment Pain Assessment: 0-10 Pain Score: 5  Pain Location: back pain; left ankle pain Pain Descriptors / Indicators: Sore Pain Intervention(s): Limited activity within patient's tolerance, Monitored during session, Repositioned    Home Living Family/patient expects to be discharged to:: Private residence Living Arrangements: Spouse/significant other Available Help at Discharge: Family Type of Home: House Home Access: Stairs to  enter Entrance Stairs-Rails: None Entrance Stairs-Number of Steps: 1   Home Layout: One level Home Equipment: Agricultural consultant (2 wheels);Cane - single point;Crutches;BSC/3in1;Shower seat      Prior Function Prior Level of Function : Independent/Modified Independent                     Extremity/Trunk Assessment   Upper Extremity Assessment Upper Extremity Assessment: Overall WFL for tasks assessed    Lower Extremity Assessment Lower Extremity Assessment: Generalized weakness;LLE deficits/detail LLE Deficits / Details: left ankle sprain    Cervical / Trunk Assessment Cervical / Trunk Assessment: Normal  Communication   Communication Communication: No apparent difficulties  Cognition Arousal: Alert Behavior During Therapy: WFL for tasks assessed/performed Overall Cognitive Status: Within Functional Limits for tasks assessed                                          General Comments General comments (skin integrity, edema, etc.): left ankle wrapped in ace bandage    Exercises     Assessment/Plan    PT Assessment Patient needs continued PT services  PT Problem List Decreased strength;Decreased range of motion;Decreased activity tolerance;Decreased mobility;Pain       PT Treatment Interventions Gait training;Functional mobility training;Therapeutic activities;Therapeutic exercise;Patient/family education    PT Goals (Current goals can be found in the Care Plan section)  Acute Rehab PT Goals Patient Stated Goal: return home PT Goal Formulation: With patient Time For Goal Achievement: 07/06/23 Potential to Achieve Goals: Good    Frequency Min 2X/week     Co-evaluation               AM-PAC PT "6 Clicks" Mobility  Outcome Measure Help needed turning from your back to your side while in a flat bed without using bedrails?: None Help needed moving from lying on your back to sitting on the side of a flat bed without using bedrails?: A  Little Help needed moving to and from a bed to a chair (including a wheelchair)?: A Little Help needed standing up from a chair using your arms (e.g., wheelchair or bedside chair)?: A Little Help needed to walk in hospital room?: A Little Help needed climbing 3-5 steps with a railing? : A Little 6 Click Score: 19    End of Session   Activity Tolerance: Patient tolerated treatment well;Patient limited by pain Patient left: in chair;with call bell/phone within reach Nurse Communication: Mobility status PT Visit Diagnosis: Difficulty in walking, not elsewhere classified (R26.2);Pain Pain - Right/Left: Left Pain - part of body: Ankle and joints of foot (back)    Time: 0950-1010 PT Time Calculation (min) (ACUTE ONLY): 20 min   Charges:   PT Evaluation $PT Eval Low Complexity: 1 Low   PT General Charges $$ ACUTE PT VISIT: 1 Visit         11:15 AM, 06/22/23 Dalinda Heidt Small Monai Hindes MPT [facility] [place] [phone] [phone]

## 2023-06-24 ENCOUNTER — Encounter: Payer: Self-pay | Admitting: Internal Medicine

## 2023-06-24 ENCOUNTER — Ambulatory Visit (INDEPENDENT_AMBULATORY_CARE_PROVIDER_SITE_OTHER): Payer: Medicare HMO | Admitting: Internal Medicine

## 2023-06-24 VITALS — BP 139/64 | HR 66 | Ht 66.75 in | Wt 199.4 lb

## 2023-06-24 DIAGNOSIS — K760 Fatty (change of) liver, not elsewhere classified: Secondary | ICD-10-CM | POA: Insufficient documentation

## 2023-06-24 DIAGNOSIS — Z23 Encounter for immunization: Secondary | ICD-10-CM

## 2023-06-24 DIAGNOSIS — S065XAD Traumatic subdural hemorrhage with loss of consciousness status unknown, subsequent encounter: Secondary | ICD-10-CM | POA: Diagnosis not present

## 2023-06-24 DIAGNOSIS — I7121 Aneurysm of the ascending aorta, without rupture: Secondary | ICD-10-CM | POA: Diagnosis not present

## 2023-06-24 DIAGNOSIS — S065XAA Traumatic subdural hemorrhage with loss of consciousness status unknown, initial encounter: Secondary | ICD-10-CM

## 2023-06-24 NOTE — Assessment & Plan Note (Signed)
Stable appearance on CTA chest updated in August.  He will continue annual surveillance.

## 2023-06-24 NOTE — Patient Instructions (Signed)
It was a pleasure to see you today.  Thank you for giving [place] the opportunity to be involved in your care.  Below is a brief recap of your visit and next steps.  We will plan to see you again in 3 months.  Summary No medication changes today. Resume aspirin after 1 week. Follow up in 3 months Please see the attached exercises for your neck

## 2023-06-24 NOTE — Assessment & Plan Note (Signed)
His acute concern today is expressing frustration over fatty liver changes that were noted on recent imaging.  He is interested in treatment options and states that he has previously discussed this with his nephrologist. -I reviewed with Mr. Ryan Herrera that lifestyle modifications aimed at weight loss and controlling his additional chronic medical conditions are the best ways to treat NAFLD.  We will continue to monitor LFTs periodically and I have also recommended that he follow-up with gastroenterology.

## 2023-06-24 NOTE — Progress Notes (Signed)
Established Patient Office Visit  Subjective   Patient ID: Ryan Herrera, male    DOB: 1946/05/28  Age: 77 y.o. MRN: [id number]  Chief Complaint  Patient presents with   Diabetes    F/u   Gastroesophageal Reflux    F/u   Follow-up    Head injury   Ryan Herrera has been evaluated today for routine follow-up.  He was last seen by me on 6/10.  Protonix was reduced to 40 mg daily.  No additional medication changes were made and 51-month follow-up was arranged.  In the interim, he has been evaluated by gastroenterology and endocrinology for follow-up.  Most recently, Ryan Herrera presented to the emergency department on 9/14 after falling backwards and hitting his head.  His subdural hematoma was revealed on imaging and he was admitted to [facility] overnight for close monitoring.  Repeat CT head revealed no significant changes/abnormalities.  He was advised to hold aspirin x 1 week and avoid use of NSAIDs.  No neurologic deficits were appreciated.  There have otherwise been no acute interval events  Today Ryan Herrera states that he is sore along his neck.  He is accompanied by his wife today.  His acute concern is expressing frustration over a recent ultrasound that revealed fatty liver changes.  Past Medical History:  Diagnosis Date   Arthritis    hands   Coronary artery disease    a. s/p CABG in 2008 at [facility] b. re-do CABG at [facility] in 03/2022 with left RA-RI and SVG-PDA   Diabetes mellitus without complication ([name])    GERD (gastroesophageal reflux disease)    H/O poliomyelitis    age 73.  now has "weak stomach muscles"   Hypertension    IBS (irritable bowel syndrome)    Iron deficiency anemia 09/22/2018   Myocardial infarct [facility])    Renal insufficiency    Thyroid disease    Past Surgical History:  Procedure Laterality Date   BACK SURGERY  1967   BACK SURGERY  10/04/2005   benign tumor removal  2004   BIOPSY  11/18/2022   Procedure: BIOPSY;  Surgeon:  Lanelle Bal, DO;  Location: [facility];  Service: Endoscopy;;   COLONOSCOPY WITH PROPOFOL N/A 11/18/2022   Procedure: COLONOSCOPY WITH PROPOFOL;  Surgeon: Lanelle Bal, DO;  Location: [facility];  Service: Endoscopy;  Laterality: N/A;  8:30 am, pt did not want early appt. moved down (per PAT)   CORONARY ARTERY BYPASS GRAFT  2008   [facility] - 3 vessel   CORONARY ARTERY BYPASS GRAFT N/A 03/28/2022   Procedure: REDO CORONARY ARTERY BYPASS GRAFTING (CABG) X2 BYPASSES USING OPEN LEFT RADIAL ARTERY AND ENDOSCOPIC RIGHT GREATER SAPHENOUS VEIN HARVEST;  Surgeon: Loreli Slot, MD;  Location: [facility] OR;  Service: Open Heart Surgery;  Laterality: N/A;   CYSTOSCOPY WITH INSERTION OF UROLIFT     ESOPHAGOGASTRODUODENOSCOPY (EGD) WITH PROPOFOL N/A 11/18/2022   Procedure: ESOPHAGOGASTRODUODENOSCOPY (EGD) WITH PROPOFOL;  Surgeon: Lanelle Bal, DO;  Location: [facility];  Service: Endoscopy;  Laterality: N/A;   HERNIA REPAIR Bilateral    inguinal   HYDROCELE EXCISION / REPAIR     JOINT REPLACEMENT     left knee x2, rright x1   LEFT HEART CATH AND CORS/GRAFTS ANGIOGRAPHY N/A 03/25/2022   Procedure: LEFT HEART CATH AND CORS/GRAFTS ANGIOGRAPHY;  Surgeon: Corky Crafts, MD;  Location: [facility] INVASIVE CV LAB;  Service: Cardiovascular;  Laterality: N/A;   POLYPECTOMY  11/18/2022  Procedure: POLYPECTOMY;  Surgeon: Lanelle Bal, DO;  Location: [facility];  Service: Endoscopy;;   PROSTATE SURGERY N/A 01/2021   TEE WITHOUT CARDIOVERSION N/A 03/28/2022   Procedure: TRANSESOPHAGEAL ECHOCARDIOGRAM (TEE);  Surgeon: Loreli Slot, MD;  Location[facility];  Service: Open Heart Surgery;  Laterality: N/A;   TONSILLECTOMY     Social History   Tobacco Use   Smoking status: Former    Current packs/day: 0.00    Average packs/day: 1 pack/day for 38.0 years (38.0 ttl pk-yrs)    Types: Cigarettes    Start date: 12/1958    Quit date: 12/1996    Years since quitting: 26.5   Smokeless  tobacco: Never  Vaping Use   Vaping status: Never Used  Substance Use Topics   Alcohol use: Yes    Comment: occassionally   Drug use: No   Family History  Problem Relation Age of Onset   Hypertension Mother    Heart attack Mother    Osteoarthritis Mother    Stroke Father    Hypertension Father    Stroke Sister    Stroke Sister    Colon cancer Neg Hx    Prostate cancer Neg Hx    Cancer - Lung Neg Hx    Allergies  Allergen Reactions   Myrbetriq [Mirabegron] Nausea And Vomiting   Jardiance [Empagliflozin]     Pt does not like this medication   Niacin And Related Itching   Nsaids     Kidney damage    Other Diarrhea and Nausea And Vomiting    PT does not want OPIOIDS   GLP-1 drugs - Nausea, vomiting, diarrhea, belching   Statins Itching   Review of Systems  Constitutional:  Negative for chills and fever.  HENT:  Negative for sore throat.   Respiratory:  Negative for cough and shortness of breath.   Cardiovascular:  Negative for chest pain, palpitations and leg swelling.  Gastrointestinal:  Negative for abdominal pain, blood in stool, constipation, diarrhea, nausea and vomiting.  Genitourinary:  Negative for dysuria and hematuria.  Musculoskeletal:  Positive for neck pain. Negative for myalgias.  Skin:  Negative for itching and rash.  Neurological:  Negative for dizziness and headaches.  Psychiatric/Behavioral:  Negative for depression and suicidal ideas.      Objective:     BP 139/64 (BP Location: Left Arm, Patient Position: Sitting, Cuff Size: Large)   Pulse 66   Ht 5' 6.75" (1.695 m)   Wt 199 lb 6.4 oz (90.4 kg)   SpO2 94%   BMI 31.46 kg/m  BP Readings from Last 3 Encounters:  06/24/23 139/64  06/22/23 (!) 169/91  05/28/23 136/68   Physical Exam Vitals reviewed.  Constitutional:      General: He is not in acute distress.    Appearance: Normal appearance. He is obese. He is not ill-appearing.  HENT:     Head: Normocephalic and atraumatic.     Right  Ear: External ear normal.     Left Ear: External ear normal.     Nose: Nose normal. No congestion or rhinorrhea.     Mouth/Throat:     Mouth: Mucous membranes are moist.     Pharynx: Oropharynx is clear.  Eyes:     General: No scleral icterus.    Extraocular Movements: Extraocular movements intact.     Conjunctiva/sclera: Conjunctivae normal.     Pupils: Pupils are equal, round, and reactive to light.  Cardiovascular:     Rate and Rhythm: Normal  rate and regular rhythm.     Pulses: Normal pulses.     Heart sounds: Normal heart sounds. No murmur heard. Pulmonary:     Effort: Pulmonary effort is normal.     Breath sounds: Normal breath sounds. No wheezing, rhonchi or rales.  Abdominal:     General: Abdomen is flat. Bowel sounds are normal. There is no distension.     Palpations: Abdomen is soft.     Tenderness: There is no abdominal tenderness.  Musculoskeletal:        General: Tenderness (TTP over the left and right trapezius) present. No swelling or deformity. Normal range of motion.     Cervical back: Normal range of motion.  Skin:    General: Skin is warm and dry.     Capillary Refill: Capillary refill takes less than 2 seconds.  Neurological:     General: No focal deficit present.     Mental Status: He is alert and oriented to person, place, and time.     Motor: No weakness.  Psychiatric:        Mood and Affect: Mood normal.        Behavior: Behavior normal.        Thought Content: Thought content normal.   Last CBC Lab Results  Component Value Date   WBC 4.6 06/22/2023   HGB 11.3 (L) 06/22/2023   HCT 35.3 (L) 06/22/2023   MCV 89.4 06/22/2023   MCH 28.6 06/22/2023   RDW 13.8 06/22/2023   PLT 138 (L) 06/22/2023   Last metabolic panel Lab Results  Component Value Date   GLUCOSE 227 (H) 06/22/2023   NA 132 (L) 06/22/2023   K 3.6 06/22/2023   CL 100 06/22/2023   CO2 23 06/22/2023   BUN 15 06/22/2023   CREATININE 1.04 06/22/2023   GFRNONAA >60 06/22/2023    CALCIUM 9.0 06/22/2023   PHOS 3.8 06/22/2023   PROT 6.2 (L) 06/22/2023   ALBUMIN 3.1 (L) 06/22/2023   LABGLOB 2.9 01/22/2023   AGRATIO 1.4 01/22/2023   BILITOT 0.5 06/22/2023   ALKPHOS 76 06/22/2023   AST 28 06/22/2023   ALT 37 06/22/2023   ANIONGAP 9 06/22/2023   Last lipids Lab Results  Component Value Date   CHOL 94 02/14/2023   HDL 29 (L) 02/14/2023   LDLCALC 47 02/14/2023   TRIG 89 02/14/2023   CHOLHDL 3.2 02/14/2023   Last hemoglobin A1c Lab Results  Component Value Date   HGBA1C 9.2 (A) 05/28/2023   Last thyroid functions Lab Results  Component Value Date   TSH 3.950 01/22/2023   T3TOTAL 74 03/24/2022   Last vitamin D Lab Results  Component Value Date   VD25OH 40.5 01/22/2023   Last vitamin B12 and Folate Lab Results  Component Value Date   VITAMINB12 573 09/22/2018   FOLATE 16.6 09/22/2018     Assessment & Plan:   Problem List Items Addressed This Visit       Thoracic ascending aortic aneurysm ([name])    Stable appearance on CTA chest updated in August.  He will continue annual surveillance.      Nonalcoholic fatty liver disease    His acute concern today is expressing frustration over fatty liver changes that were noted on recent imaging.  He is interested in treatment options and states that he has previously discussed this with his nephrologist. -I reviewed with Ryan Herrera that lifestyle modifications aimed at weight loss and controlling his additional chronic medical conditions are the best ways to  treat NAFLD.  We will continue to monitor LFTs periodically and I have also recommended that he follow-up with gastroenterology.      Subdural hematoma ([name])    Recent hospital admission 9/14 - 9/15 after a mechanical fall in which he hit the back of his head.  Subdural hematoma noted on CT.  No neurologic deficits appreciated.  Patient remained asymptomatic while monitored overnight.  Repeat imaging remained stable with no significant changes.  He  is currently holding ASA x 1 week and avoiding NSAIDs. -No further workup or repeat imaging recommended at this time.  I reviewed with the patient and his wife that there is a low threshold to present to the emergency department for repeat imaging should he develop AMS, severe headache, or neurologic change.      Need for influenza vaccination    Influenza vaccine administered today      Return in about 3 months (around 09/23/2023).   Billie Lade, MD

## 2023-06-24 NOTE — Assessment & Plan Note (Signed)
Recent hospital admission 9/14 - 9/15 after a mechanical fall in which he hit the back of his head.  Subdural hematoma noted on CT.  No neurologic deficits appreciated.  Patient remained asymptomatic while monitored overnight.  Repeat imaging remained stable with no significant changes.  He is currently holding ASA x 1 week and avoiding NSAIDs. -No further workup or repeat imaging recommended at this time.  I reviewed with the patient and his wife that there is a low threshold to present to the emergency department for repeat imaging should he develop AMS, severe headache, or neurologic change.

## 2023-06-24 NOTE — Assessment & Plan Note (Signed)
Influenza vaccine administered today.

## 2023-06-25 ENCOUNTER — Ambulatory Visit (INDEPENDENT_AMBULATORY_CARE_PROVIDER_SITE_OTHER): Payer: Medicare HMO | Admitting: Podiatry

## 2023-06-25 ENCOUNTER — Encounter: Payer: Self-pay | Admitting: Podiatry

## 2023-06-25 DIAGNOSIS — M79675 Pain in left toe(s): Secondary | ICD-10-CM

## 2023-06-25 DIAGNOSIS — M79674 Pain in right toe(s): Secondary | ICD-10-CM | POA: Diagnosis not present

## 2023-06-25 DIAGNOSIS — E119 Type 2 diabetes mellitus without complications: Secondary | ICD-10-CM | POA: Diagnosis not present

## 2023-06-25 DIAGNOSIS — B351 Tinea unguium: Secondary | ICD-10-CM

## 2023-06-25 NOTE — Progress Notes (Signed)
This patient presents to the office with chief complaint of long thick nails and diabetic feet.  This patient  says there  is  no pain and discomfort in their feet.  This patient says there are long thick painful  big nails.  These nails are painful walking and wearing shoes.  Patient has no history of infection or drainage from both feet.  Patient is unable to  self treat his own nails . This patient presents  to the office today for treatment of the  long nails and a foot evaluation due to history of  diabetes.  General Appearance  Alert, conversant and in no acute stress.  Vascular  Dorsalis pedis and posterior tibial  pulses are palpable  bilaterally.  Capillary return is within normal limits  bilaterally. Temperature is within normal limits  bilaterally.  Neurologic  Senn-Weinstein monofilament wire test diminished  bilaterally. Muscle power within normal limits bilaterally.  Nails Thick disfigured discolored nails with subungual debris  hallux nails bilaterally. No evidence of bacterial infection or drainage bilaterally.  Orthopedic  No limitations of motion of motion feet .  No crepitus or effusions noted.  No bony pathology or digital deformities noted.  Skin  normotropic skin with no porokeratosis noted bilaterally.  No signs of infections or ulcers noted.     Onychomycosis  Diabetes with no foot complications  IE  Debride nails x 10.  A diabetic foot exam was performed and there is no evidence of any vascular or neurologic pathology.   RTC 3 months.   Helane Gunther DPM

## 2023-06-26 ENCOUNTER — Other Ambulatory Visit: Payer: Self-pay | Admitting: Internal Medicine

## 2023-06-26 ENCOUNTER — Telehealth: Payer: Self-pay | Admitting: Internal Medicine

## 2023-06-26 NOTE — Telephone Encounter (Signed)
Patient called back upper back pain woke up with it and pain is intense asking does he need to go to er or urgent care. Hard to breath. Call back # [phone].

## 2023-06-26 NOTE — Telephone Encounter (Signed)
Patient calling says muscle relaxers are not working- wishing to speak with a nurse regarding next steps. Please advise Thank you

## 2023-06-26 NOTE — Telephone Encounter (Signed)
Patient advised.

## 2023-06-27 ENCOUNTER — Encounter: Payer: Self-pay | Admitting: Emergency Medicine

## 2023-06-27 ENCOUNTER — Other Ambulatory Visit: Payer: Self-pay

## 2023-06-27 ENCOUNTER — Ambulatory Visit
Admission: EM | Admit: 2023-06-27 | Discharge: 2023-06-27 | Disposition: A | Discharge status: Home / Self Care | Payer: Medicare HMO | Attending: Family Medicine | Admitting: Family Medicine

## 2023-06-27 ENCOUNTER — Ambulatory Visit: Payer: Medicare HMO

## 2023-06-27 ENCOUNTER — Telehealth: Payer: Self-pay

## 2023-06-27 DIAGNOSIS — M546 Pain in thoracic spine: Secondary | ICD-10-CM

## 2023-06-27 DIAGNOSIS — R030 Elevated blood-pressure reading, without diagnosis of hypertension: Secondary | ICD-10-CM

## 2023-06-27 DIAGNOSIS — M5134 Other intervertebral disc degeneration, thoracic region: Secondary | ICD-10-CM | POA: Diagnosis not present

## 2023-06-27 MED ORDER — HYDROCODONE-ACETAMINOPHEN 5-325 MG PO TABS
1.0000 | ORAL_TABLET | Freq: Three times a day (TID) | ORAL | 0 refills | Status: AC | PRN
Start: 2023-06-27 — End: ?

## 2023-06-27 NOTE — ED Triage Notes (Signed)
Pt reports was using the bathroom approximately 230 Wednesday morning and reports went to use right arm to get up from sitting position. Pt reports felt a pop and "immense pain" in mid back and ever since radiates to left rib. Pt reports recent hospital admission related to fall, subdural hematoma. Also states hx of "several back reconstructions."

## 2023-06-27 NOTE — Telephone Encounter (Signed)
Called patient to inform them that their x ray results were negative for any abnormalities?.   Pt verbalized understanding .

## 2023-06-27 NOTE — ED Notes (Signed)
Consulted PA with pt symptoms. PA reported pt stable to return to waiting room until treatment room becomes available. Pt aware of care plan.
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
## 2023-06-27 NOTE — Discharge Instructions (Signed)
I have sent in some pain medication, you may also use Voltaren gel topically, heat, massage, muscle rubs.  I will call you with your x-ray results when they are back.  Follow-up with primary care and/or orthopedics as soon as possible for a recheck

## 2023-06-30 DIAGNOSIS — M546 Pain in thoracic spine: Secondary | ICD-10-CM | POA: Diagnosis not present

## 2023-06-30 DIAGNOSIS — R0781 Pleurodynia: Secondary | ICD-10-CM | POA: Diagnosis not present

## 2023-07-04 ENCOUNTER — Ambulatory Visit
Admission: RE | Admit: 2023-07-04 | Discharge: 2023-07-04 | Disposition: A | Discharge status: Home / Self Care | Payer: Medicare HMO | Source: Ambulatory Visit | Attending: Family Medicine

## 2023-07-04 ENCOUNTER — Ambulatory Visit: Payer: Medicare HMO

## 2023-07-04 ENCOUNTER — Telehealth: Payer: Self-pay

## 2023-07-04 VITALS — BP 151/100 | HR 69 | Temp 97.9°F | Resp 20

## 2023-07-04 DIAGNOSIS — R0781 Pleurodynia: Secondary | ICD-10-CM | POA: Diagnosis not present

## 2023-07-04 DIAGNOSIS — J40 Bronchitis, not specified as acute or chronic: Secondary | ICD-10-CM | POA: Diagnosis not present

## 2023-07-04 DIAGNOSIS — R059 Cough, unspecified: Secondary | ICD-10-CM | POA: Diagnosis not present

## 2023-07-04 DIAGNOSIS — J22 Unspecified acute lower respiratory infection: Secondary | ICD-10-CM

## 2023-07-04 DIAGNOSIS — M25572 Pain in left ankle and joints of left foot: Secondary | ICD-10-CM | POA: Diagnosis not present

## 2023-07-04 DIAGNOSIS — R079 Chest pain, unspecified: Secondary | ICD-10-CM | POA: Diagnosis not present

## 2023-07-04 MED ORDER — AMOXICILLIN-POT CLAVULANATE 875-125 MG PO TABS: 1.0000 | ORAL_TABLET | Freq: Two times a day (BID) | ORAL | 0 refills | Status: DC

## 2023-07-04 MED ORDER — METHOCARBAMOL 500 MG PO TABS: 500.0000 mg | ORAL_TABLET | Freq: Three times a day (TID) | ORAL | 0 refills | Status: DC | PRN

## 2023-07-04 MED ORDER — HYDROCODONE-ACETAMINOPHEN 5-325 MG PO TABS: 1.0000 | ORAL_TABLET | Freq: Three times a day (TID) | ORAL | 0 refills | Status: DC | PRN

## 2023-07-04 MED ORDER — PROMETHAZINE-DM 6.25-15 MG/5ML PO SYRP: 5.0000 mL | ORAL_SOLUTION | Freq: Four times a day (QID) | ORAL | 0 refills | Status: DC | PRN

## 2023-07-04 NOTE — Telephone Encounter (Signed)
Called patient and gave him the results of chest x-ray. Provider reviewed x-ray and results and stated it was negative no pneumonia. Patient understood and had no questions or concerns.

## 2023-07-04 NOTE — ED Triage Notes (Signed)
Pt reports he still has left side rib pain since last week. Complains of chest congestion.
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
## 2023-07-09 ENCOUNTER — Other Ambulatory Visit: Payer: Self-pay | Admitting: Internal Medicine

## 2023-07-13 DIAGNOSIS — M546 Pain in thoracic spine: Secondary | ICD-10-CM | POA: Diagnosis not present

## 2023-07-16 ENCOUNTER — Other Ambulatory Visit: Payer: Self-pay | Admitting: "Endocrinology

## 2023-08-01 DIAGNOSIS — M4722 Other spondylosis with radiculopathy, cervical region: Secondary | ICD-10-CM | POA: Diagnosis not present

## 2023-08-07 ENCOUNTER — Telehealth: Payer: Self-pay | Admitting: Internal Medicine

## 2023-08-07 NOTE — Telephone Encounter (Signed)
Placard  Noted  Copied Sleeved  Original in PCP box Copy front desk folder

## 2023-08-08 NOTE — Telephone Encounter (Signed)
Patient called asking can this be signed today for pick up and he would like a call back today at [phone] can leave a detail message on answering machine

## 2023-08-11 ENCOUNTER — Telehealth: Payer: Self-pay | Admitting: Internal Medicine

## 2023-08-11 NOTE — Telephone Encounter (Signed)
Called patient placard ready for pick up ?

## 2023-08-11 NOTE — Telephone Encounter (Signed)
Patient picked up form

## 2023-09-01 ENCOUNTER — Telehealth: Payer: Self-pay

## 2023-09-01 NOTE — Telephone Encounter (Signed)
Copied from CRM [id number]. Topic: Referral - Request for Referral >> Aug 29, 2023 12:07 PM Hendricks Limes wrote: Did the patient discuss referral with their provider in the last year? Yes (If No - schedule appointment) (If Yes - send message)  Appointment offered? No  Type of order/referral and detailed reason for visit: Dermatology  Preference of office, provider, location: [facility] in [place], [place]. [address]. Caralyn Guile. Needs referral to be marked urgent in order to be seen before July.   If referral order, have you been seen by this specialty before? No (If Yes, this issue or another issue? When? Where?  Can we respond through MyChart? Yes

## 2023-09-01 NOTE — Telephone Encounter (Signed)
Duplicate phone encounter.

## 2023-09-01 NOTE — Telephone Encounter (Signed)
Ryan Herrera states he has been suffering with skin irritation on his face and scalp and sometimes groin area that he contributes to his diabetes. He has not been satisfied with any dermatologist he has previously seen and his wife recently seen Dr Caralyn Guile at [facility] dermatology and he wants to be referred there and the office told him to mark it urgent because they are 8 months out on appts and that will help him get in faster. Ok to refer?

## 2023-09-01 NOTE — Telephone Encounter (Signed)
Copied from CRM [id number]. Topic: Referral - Request for Referral >> Aug 29, 2023 12:08 PM Hendricks Limes wrote: Did the patient discuss referral with their provider in the last year? Yes (If No - schedule appointment) (If Yes - send message)  Appointment offered? Yes  Type of order/referral and detailed reason for visit: Dermatology Preference of office, provider, location: PT would like to be referred to [facility] in [place] with Dr. Caralyn Guile. [address] Dr. Caralyn Guile office said the referral has to be marked urgent in order to get Mell in before July.    If referral order, have you been seen by this specialty before? No (If Yes, this issue or another issue? When? Where?  Can we respond through MyChart? Yes

## 2023-09-02 ENCOUNTER — Other Ambulatory Visit: Payer: Self-pay | Admitting: Internal Medicine

## 2023-09-02 DIAGNOSIS — B356 Tinea cruris: Secondary | ICD-10-CM

## 2023-09-02 DIAGNOSIS — L209 Atopic dermatitis, unspecified: Secondary | ICD-10-CM

## 2023-09-02 NOTE — Telephone Encounter (Signed)
Referral entered  

## 2023-09-12 DIAGNOSIS — M47812 Spondylosis without myelopathy or radiculopathy, cervical region: Secondary | ICD-10-CM | POA: Diagnosis not present

## 2023-09-12 DIAGNOSIS — M5412 Radiculopathy, cervical region: Secondary | ICD-10-CM | POA: Diagnosis not present

## 2023-09-20 ENCOUNTER — Other Ambulatory Visit: Payer: Self-pay | Admitting: Family Medicine

## 2023-09-20 DIAGNOSIS — E039 Hypothyroidism, unspecified: Secondary | ICD-10-CM

## 2023-09-22 ENCOUNTER — Encounter: Payer: Self-pay | Admitting: Internal Medicine

## 2023-09-22 ENCOUNTER — Ambulatory Visit: Payer: Medicare HMO | Attending: Internal Medicine | Admitting: Internal Medicine

## 2023-09-22 ENCOUNTER — Encounter: Payer: Self-pay | Admitting: Oncology

## 2023-09-22 VITALS — BP 130/60 | HR 75 | Resp 15 | Ht 66.0 in | Wt 196.0 lb

## 2023-09-22 DIAGNOSIS — I2510 Atherosclerotic heart disease of native coronary artery without angina pectoris: Secondary | ICD-10-CM | POA: Diagnosis not present
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
## 2023-09-22 NOTE — Patient Instructions (Signed)
 Medication Instructions:  No changes  *If you need a refill on your cardiac medications before your next appointment, please call your pharmacy*     Follow-Up: At [facility], you and your health needs are our priority.  As part of our continuing mission to provide you with exceptional heart care, we have created designated Provider Care Teams.  These Care Teams include your primary Cardiologist (physician) and Advanced Practice Providers (APPs -  Physician Assistants and Nurse Practitioners) who all work together to provide you with the care you need, when you need it.    Your next appointment:   6 month(s)  Provider:   Maisie Fus, MD

## 2023-09-23 ENCOUNTER — Ambulatory Visit (INDEPENDENT_AMBULATORY_CARE_PROVIDER_SITE_OTHER): Payer: Medicare HMO | Admitting: Internal Medicine

## 2023-09-23 ENCOUNTER — Encounter: Payer: Self-pay | Admitting: Internal Medicine

## 2023-09-23 VITALS — BP 130/84 | HR 66 | Ht 66.5 in | Wt 197.0 lb

## 2023-09-23 DIAGNOSIS — K529 Noninfective gastroenteritis and colitis, unspecified: Secondary | ICD-10-CM | POA: Diagnosis not present

## 2023-09-23 DIAGNOSIS — K760 Fatty (change of) liver, not elsewhere classified: Secondary | ICD-10-CM | POA: Diagnosis not present

## 2023-09-23 DIAGNOSIS — I10 Essential (primary) hypertension: Secondary | ICD-10-CM

## 2023-09-23 DIAGNOSIS — K219 Gastro-esophageal reflux disease without esophagitis: Secondary | ICD-10-CM

## 2023-09-23 NOTE — Patient Instructions (Addendum)
It was a pleasure to see you today.  Thank you for giving [place] the opportunity to be involved in your care.  Below is a brief recap of your visit and next steps.  We will plan to see you again in 3 months.  Summary No medication changes today New GI referral placed We will tentatively plan for follow up in 3 months

## 2023-09-23 NOTE — Progress Notes (Unsigned)
Established Patient Office Visit  Subjective   Patient ID: Ryan Herrera, male    DOB: 1946/05/20  Age: 77 y.o. MRN: [id number]  Chief Complaint  Patient presents with   Follow-up    Three month follow up    Referral    Referral to a different Gastrology office   [name] returns to care today for routine follow-up.  He was last evaluated by me on 9/17.  No medication changes were made at that time and 53-month follow-up was arranged.  In the interim, he has been seen by podiatry and cardiology for follow-up.  ED presentations on 9/20 and 9/27 in the setting of back pain and URI symptoms respectively.  He has also been evaluated by orthopedic surgery ([facility]) in the setting of cervical spondylosis with radiculopathy.  Past Medical History:  Diagnosis Date   Arthritis    hands   Coronary artery disease    a. s/p CABG in 2008 at [facility] b. re-do CABG at [facility] in 03/2022 with left RA-RI and SVG-PDA   Diabetes mellitus without complication ([name])    GERD (gastroesophageal reflux disease)    H/O poliomyelitis    age 37.  now has "weak stomach muscles"   Hypertension    IBS (irritable bowel syndrome)    Iron deficiency anemia 09/22/2018   Myocardial infarct [facility])    Renal insufficiency    Thyroid disease    Past Surgical History:  Procedure Laterality Date   BACK SURGERY  1967   BACK SURGERY  10/04/2005   benign tumor removal  2004   BIOPSY  11/18/2022   Procedure: BIOPSY;  Surgeon: Lanelle Bal, DO;  [facility];  Service: Endoscopy;;   COLONOSCOPY WITH PROPOFOL N/A 11/18/2022   Procedure: COLONOSCOPY WITH PROPOFOL;  Surgeon: Lanelle Bal, DO;  Location[facility];  Service: Endoscopy;  Laterality: N/A;  8:30 am, pt did not want early appt. moved down (per PAT)   CORONARY ARTERY BYPASS GRAFT  2008   [facility] - 3 vessel   CORONARY ARTERY BYPASS GRAFT N/A 03/28/2022   Procedure: REDO CORONARY ARTERY BYPASS GRAFTING (CABG) X2 BYPASSES USING  OPEN LEFT RADIAL ARTERY AND ENDOSCOPIC RIGHT GREATER SAPHENOUS VEIN HARVEST;  Surgeon: Loreli Slot, MD;  Location: [facility] OR;  Service: Open Heart Surgery;  Laterality: N/A;   CYSTOSCOPY WITH INSERTION OF UROLIFT     ESOPHAGOGASTRODUODENOSCOPY (EGD) WITH PROPOFOL N/A 11/18/2022   Procedure: ESOPHAGOGASTRODUODENOSCOPY (EGD) WITH PROPOFOL;  Surgeon: Lanelle Bal, DO;  [facility];  Service: Endoscopy;  Laterality: N/A;   HERNIA REPAIR Bilateral    inguinal   HYDROCELE EXCISION / REPAIR     JOINT REPLACEMENT     left knee x2, rright x1   LEFT HEART CATH AND CORS/GRAFTS ANGIOGRAPHY N/A 03/25/2022   Procedure: LEFT HEART CATH AND CORS/GRAFTS ANGIOGRAPHY;  Surgeon: Corky Crafts, MD;  [facility] INVAS[facility] CV LAB;  Service: Cardiovascular;  Laterality: N/A;   POLYPECTOMY  11/18/2022   Procedure: POLYPECTOMY;  Surgeon: Lanelle Bal, DO;  [facility];  Service: Endoscopy;;   PROSTATE SURGERY N/A 01/2021   TEE WITHOUT CARDIOVERSION N/A 03/28/2022   Procedure: TRANSESOPHAGEAL ECHOCARDIOGRAM (TEE);  Surgeon: Loreli Slot, MD;  Location: [facility] OR;  Service: Open Heart Surgery;  Laterality: N/A;   TONSILLECTOMY     Social History   Tobacco Use   Smoking status: Former    Current packs/day: 0.00    Average packs/day: 1  pack/day for 38.0 years (38.0 ttl pk-yrs)    Types: Cigarettes    Start date: 12/1958    Quit date: 12/1996    Years since quitting: 26.8   Smokeless tobacco: Never  Vaping Use   Vaping status: Never Used  Substance Use Topics   Alcohol use: Yes    Comment: occassionally   Drug use: No   Family History  Problem Relation Age of Onset   Hypertension Mother    Heart attack Mother    Osteoarthritis Mother    Stroke Father    Hypertension Father    Stroke Sister    Stroke Sister    Colon cancer Neg Hx    Prostate cancer Neg Hx    Cancer - Lung Neg Hx    Allergies  Allergen Reactions   Myrbetriq [Mirabegron] Nausea And  Vomiting   Jardiance [Empagliflozin]     Pt does not like this medication   Niacin And Related Itching   Nsaids     Kidney damage    Other Diarrhea and Nausea And Vomiting    PT does not want OPIOIDS   GLP-1 drugs - Nausea, vomiting, diarrhea, belching   Statins Itching   ROS    Objective:     BP (!) 156/73 (BP Location: Left Arm, Patient Position: Sitting, Cuff Size: Normal)   Pulse 66   Ht 5' 6.5" (1.689 m)   Wt 197 lb (89.4 kg)   SpO2 96%   BMI 31.32 kg/m  BP Readings from Last 3 Encounters:  09/23/23 (!) 156/73  09/22/23 130/60  07/04/23 (!) 151/100   Physical Exam  Last CBC Lab Results  Component Value Date   WBC 4.6 06/22/2023   HGB 11.3 (L) 06/22/2023   HCT 35.3 (L) 06/22/2023   MCV 89.4 06/22/2023   MCH 28.6 06/22/2023   RDW 13.8 06/22/2023   PLT 138 (L) 06/22/2023   Last metabolic panel Lab Results  Component Value Date   GLUCOSE 227 (H) 06/22/2023   NA 132 (L) 06/22/2023   K 3.6 06/22/2023   CL 100 06/22/2023   CO2 23 06/22/2023   BUN 15 06/22/2023   CREATININE 1.04 06/22/2023   GFRNONAA >60 06/22/2023   CALCIUM 9.0 06/22/2023   PHOS 3.8 06/22/2023   PROT 6.2 (L) 06/22/2023   ALBUMIN 3.1 (L) 06/22/2023   LABGLOB 2.9 01/22/2023   AGRATIO 1.4 01/22/2023   BILITOT 0.5 06/22/2023   ALKPHOS 76 06/22/2023   AST 28 06/22/2023   ALT 37 06/22/2023   ANIONGAP 9 06/22/2023   Last lipids Lab Results  Component Value Date   CHOL 94 02/14/2023   HDL 29 (L) 02/14/2023   LDLCALC 47 02/14/2023   TRIG 89 02/14/2023   CHOLHDL 3.2 02/14/2023   Last hemoglobin A1c Lab Results  Component Value Date   HGBA1C 9.2 (A) 05/28/2023   Last thyroid functions Lab Results  Component Value Date   TSH 3.950 01/22/2023   T3TOTAL 74 03/24/2022   Last vitamin D Lab Results  Component Value Date   VD25OH 40.5 01/22/2023   Last vitamin B12 and Folate Lab Results  Component Value Date   VITAMINB12 573 09/22/2018   FOLATE 16.6 09/22/2018      Assessment & Plan:   Problem List Items Addressed This Visit   None   No follow-ups on file.    Billie Lade, MD

## 2023-09-24 ENCOUNTER — Ambulatory Visit (INDEPENDENT_AMBULATORY_CARE_PROVIDER_SITE_OTHER): Payer: Medicare HMO | Admitting: Podiatry

## 2023-09-24 ENCOUNTER — Encounter: Payer: Self-pay | Admitting: Podiatry

## 2023-09-24 DIAGNOSIS — E119 Type 2 diabetes mellitus without complications: Secondary | ICD-10-CM

## 2023-09-24 DIAGNOSIS — B351 Tinea unguium: Secondary | ICD-10-CM | POA: Diagnosis not present

## 2023-09-24 DIAGNOSIS — M79675 Pain in left toe(s): Secondary | ICD-10-CM | POA: Diagnosis not present

## 2023-09-24 DIAGNOSIS — M79674 Pain in right toe(s): Secondary | ICD-10-CM | POA: Diagnosis not present

## 2023-09-24 NOTE — Progress Notes (Signed)
This patient returns to my office for at risk foot care.  This patient requires this care by a professional since this patient will be at risk due to having diabetes.   This patient is unable to cut nails himself since the patient cannot reach his nails.These nails are painful walking and wearing shoes.  This patient presents for at risk foot care today.  General Appearance  Alert, conversant and in no acute stress.  Vascular  Dorsalis pedis and posterior tibial  pulses are palpable  bilaterally.  Capillary return is within normal limits  bilaterally. Temperature is within normal limits  bilaterally.  Neurologic  Senn-Weinstein monofilament wire test within normal limits  bilaterally. Muscle power within normal limits bilaterally.  Nails Thick disfigured discolored nails with subungual debris  from hallux to fifth toes bilaterally. No evidence of bacterial infection or drainage bilaterally.  Orthopedic  No limitations of motion  feet .  No crepitus or effusions noted.  No bony pathology or digital deformities noted.  Skin  normotropic skin with no porokeratosis noted bilaterally.  No signs of infections or ulcers noted.     Onychomycosis  Pain in right toes  Pain in left toes  Consent was obtained for treatment procedures.   Mechanical debridement of nails 1-5  bilaterally performed with a nail nipper.  Filed with dremel without incident. Diabetic exam reveals no vascular or neurological pathology.   Return office visit    3 months                  Told patient to return for periodic foot care and evaluation due to potential at risk complications.   Helane Gunther DPM

## 2023-09-29 ENCOUNTER — Ambulatory Visit (INDEPENDENT_AMBULATORY_CARE_PROVIDER_SITE_OTHER): Payer: Medicare HMO | Admitting: "Endocrinology

## 2023-09-29 ENCOUNTER — Encounter: Payer: Self-pay | Admitting: "Endocrinology

## 2023-09-29 VITALS — BP 136/64 | HR 68 | Ht 65.5 in | Wt 199.6 lb

## 2023-09-29 DIAGNOSIS — Z6831 Body mass index (BMI) 31.0-31.9, adult: Secondary | ICD-10-CM | POA: Diagnosis not present

## 2023-09-29 DIAGNOSIS — E039 Hypothyroidism, unspecified: Secondary | ICD-10-CM

## 2023-09-29 DIAGNOSIS — I10 Essential (primary) hypertension: Secondary | ICD-10-CM

## 2023-09-29 DIAGNOSIS — E66811 Obesity, class 1: Secondary | ICD-10-CM | POA: Diagnosis not present

## 2023-09-29 DIAGNOSIS — E782 Mixed hyperlipidemia: Secondary | ICD-10-CM

## 2023-09-29 DIAGNOSIS — E6609 Other obesity due to excess calories: Secondary | ICD-10-CM | POA: Diagnosis not present

## 2023-09-29 DIAGNOSIS — E1159 Type 2 diabetes mellitus with other circulatory complications: Secondary | ICD-10-CM

## 2023-09-29 DIAGNOSIS — Z794 Long term (current) use of insulin: Secondary | ICD-10-CM | POA: Diagnosis not present

## 2023-09-29 LAB — POCT GLYCOSYLATED HEMOGLOBIN (HGB A1C): HbA1c, POC (controlled diabetic range): 8.5 % — AB (ref 0.0–7.0)

## 2023-09-29 MED ORDER — ACCU-CHEK GUIDE TEST VI STRP: ORAL_STRIP | 12 refills | Status: DC

## 2023-09-29 MED ORDER — GLIPIZIDE ER 5 MG PO TB24: 5.0000 mg | ORAL_TABLET | Freq: Every day | ORAL | 1 refills | Status: DC

## 2023-09-29 MED ORDER — TRESIBA FLEXTOUCH 100 UNIT/ML ~~LOC~~ SOPN: 30.0000 [IU] | PEN_INJECTOR | Freq: Every day | SUBCUTANEOUS | 1 refills | Status: DC

## 2023-09-29 MED ORDER — ACCU-CHEK GUIDE ME W/DEVICE KIT: 1.0000 | PACK | 0 refills | Status: AC

## 2023-09-29 NOTE — Progress Notes (Signed)
09/29/2023, 6:56 PM   Endocrinology follow-up note  Subjective:    Patient ID: Ryan Herrera, male    DOB: 18-Jun-1946.  Ryan Herrera is being seen in follow-up after he was feeling consultation for management of currently uncontrolled symptomatic diabetes requested by  Billie Lade, MD.   Past Medical History:  Diagnosis Date   Arthritis    hands   Coronary artery disease    a. s/p CABG in 2008 at [facility] b. re-do CABG at [facility] in 03/2022 with left RA-RI and SVG-PDA   Diabetes mellitus without complication ([name])    GERD (gastroesophageal reflux disease)    H/O poliomyelitis    age 77.  now has "weak stomach muscles"   Hypertension    IBS (irritable bowel syndrome)    Iron deficiency anemia 09/22/2018   Myocardial infarct [facility])    Renal insufficiency    Thyroid disease     Past Surgical History:  Procedure Laterality Date   BACK SURGERY  1967   BACK SURGERY  10/04/2005   benign tumor removal  2004   BIOPSY  11/18/2022   Procedure: BIOPSY;  Surgeon: Lanelle Bal, DO;  [facility];  Service: Endoscopy;;   COLONOSCOPY WITH PROPOFOL N/A 11/18/2022   Procedure: COLONOSCOPY WITH PROPOFOL;  Surgeon: Lanelle Bal, DO;  Location: [facility];  Service: Endoscopy;  Laterality: N/A;  8:30 am, pt did not want early appt. moved down (per PAT)   CORONARY ARTERY BYPASS GRAFT  2008   [facility] - 3 vessel   CORONARY ARTERY BYPASS GRAFT N/A 03/28/2022   Procedure: REDO CORONARY ARTERY BYPASS GRAFTING (CABG) X2 BYPASSES USING OPEN LEFT RADIAL ARTERY AND ENDOSCOPIC RIGHT GREATER SAPHENOUS VEIN HARVEST;  Surgeon: Loreli Slot, MD;  Location: [facility] OR;  Service: Open Heart Surgery;  Laterality: N/A;   CYSTOSCOPY WITH INSERTION OF UROLIFT     ESOPHAGOGASTRODUODENOSCOPY (EGD) WITH PROPOFOL N/A 11/18/2022   Procedure: ESOPHAGOGASTRODUODENOSCOPY (EGD) WITH PROPOFOL;  Surgeon: Lanelle Bal, DO;  [facility];   Service: Endoscopy;  Laterality: N/A;   HERNIA REPAIR Bilateral    inguinal   HYDROCELE EXCISION / REPAIR     JOINT REPLACEMENT     left knee x2, rright x1   LEFT HEART CATH AND CORS/GRAFTS ANGIOGRAPHY N/A 03/25/2022   Procedure: LEFT HEART CATH AND CORS/GRAFTS ANGIOGRAPHY;  Surgeon: Corky Crafts, MD;  Location: [facility] INVASIVE CV LAB;  Service: Cardiovascular;  Laterality: N/A;   POLYPECTOMY  11/18/2022   Procedure: POLYPECTOMY;  Surgeon: Lanelle Bal, DO;  [facility];  Service: Endoscopy;;   PROSTATE SURGERY N/A 01/2021   TEE WITHOUT CARDIOVERSION N/A 03/28/2022   Procedure: TRANSESOPHAGEAL ECHOCARDIOGRAM (TEE);  Surgeon: Loreli Slot, MD;  Location: [facility] OR;  Service: Open Heart Surgery;  Laterality: N/A;   TONSILLECTOMY      Social History   Socioeconomic History   Marital status: Divorced    Spouse name: Not on file   Number of children: 1   Years of education: 12   Highest education level: Master's degree (e.g., MA, MS, MEng, MEd, MSW, MBA)  Occupational History   Occupation: retired  Tobacco Use   Smoking status: Former    Current packs/day: 0.00    Average packs/day: 1 pack/day for 38.0 years (38.0 ttl pk-yrs)    Types: Cigarettes    Start date: 12/1958    Quit date: 12/1996    Years since  quitting: 26.8   Smokeless tobacco: Never  Vaping Use   Vaping status: Never Used  Substance and Sexual Activity   Alcohol use: Yes    Comment: occassionally   Drug use: No   Sexual activity: Not Currently  Other Topics Concern   Not on file  Social History Narrative   Lives with his significant other, pt is retired    Teacher, early years/pre Strain: Low Risk  (12/05/2022)   Overall Financial Resource Strain (CARDIA)    Difficulty of Paying Living Expenses: Not hard at all  Food Insecurity: No Food Insecurity (06/21/2023)   Hunger Vital Sign    Worried About Running Out of Food in the Last Year: Never true    Ran Out of  Food in the Last Year: Never true  Transportation Needs: No Transportation Needs (06/21/2023)   PRAPARE - Administrator, Civil Service (Medical): No    Lack of Transportation (Non-Medical): No  Physical Activity: Inactive (12/05/2022)   Exercise Vital Sign    Days of Exercise per Week: 0 days    Minutes of Exercise per Session: 0 min  Stress: No Stress Concern Present (12/05/2022)   [employer] of Occupational Health - Occupational Stress Questionnaire    Feeling of Stress : Only a little  Social Connections: Socially Isolated (12/05/2022)   Social Connection and Isolation Panel [NHANES]    Frequency of Communication with Friends and Family: More than three times a week    Frequency of Social Gatherings with Friends and Family: Once a week    Attends Religious Services: Never    Database administrator or Organizations: No    Attends Engineer, structural: Never    Marital Status: Divorced    Family History  Problem Relation Age of Onset   Hypertension Mother    Heart attack Mother    Osteoarthritis Mother    Stroke Father    Hypertension Father    Stroke Sister    Stroke Sister    Colon cancer Neg Hx    Prostate cancer Neg Hx    Cancer - Lung Neg Hx     Outpatient Encounter Medications as of 09/29/2023  Medication Sig   Blood Glucose Monitoring Suppl (ACCU-CHEK GUIDE ME) w/Device KIT 1 Piece by Does not apply route as directed.   glucose blood (ACCU-CHEK GUIDE TEST) test strip Use to monitor glucose 2 times daily as instructed   acetaminophen (TYLENOL) 650 MG CR tablet Take 1,300 mg by mouth every 8 (eight) hours as needed for pain.   allopurinol (ZYLOPRIM) 100 MG tablet Take 1 tablet (100 mg total) by mouth daily.   amoxicillin-clavulanate (AUGMENTIN) 875-125 MG tablet Take 1 tablet by mouth every 12 (twelve) hours.   aspirin EC 81 MG tablet Take 1 tablet (81 mg total) by mouth daily.   Carboxymethylcellulose Sodium (THERATEARS PF OP) Place 1 drop  into both eyes daily as needed (dry eyes).   carvedilol (COREG) 6.25 MG tablet Take 1 tablet (6.25 mg total) by mouth 2 (two) times daily.   Cholecalciferol (D3 2000) 50 MCG (2000 UT) CAPS Take 1 capsule by mouth daily.   clotrimazole-betamethasone (LOTRISONE) cream Apply 1 Application topically 2 (two) times daily as needed (rash).   cyanocobalamin (VITAMIN B12) 1000 MCG tablet Take 1,000 mcg by mouth daily.   Evolocumab (REPATHA SURECLICK) 140 MG/ML SOAJ INJECT 140 MG INTO THE SKIN EVERY 14 (FOURTEEN) DAYS.   ezetimibe (ZETIA) 10 MG tablet  TAKE 1 TABLET BY MOUTH EVERY DAY   glipiZIDE (GLUCOTROL XL) 5 MG 24 hr tablet Take 1 tablet (5 mg total) by mouth daily with breakfast.   hydrochlorothiazide (HYDRODIURIL) 25 MG tablet TAKE 1 TABLET (25 MG TOTAL) BY MOUTH DAILY.   insulin degludec (TRESIBA FLEXTOUCH) 100 UNIT/ML FlexTouch Pen Inject 30 Units into the skin at bedtime.   ketoconazole (NIZORAL) 2 % cream APPLY 1 APPLICATION TOPICALLY DAILY   levothyroxine (SYNTHROID) 100 MCG tablet TAKE 1 TABLET BY MOUTH EVERY DAY BEFORE BREAKFAST   metFORMIN (GLUCOPHAGE) 1000 MG tablet TAKE 1 TABLET (1,000 MG TOTAL) BY MOUTH TWICE A DAY WITH FOOD (Patient taking differently: Take 1,000 mg by mouth 2 (two) times daily with a meal.)   pantoprazole (PROTONIX) 40 MG tablet Take 1 tablet (40 mg total) by mouth daily.   tadalafil (CIALIS) 5 MG tablet Take 5 mg by mouth daily.   valACYclovir (VALTREX) 500 MG tablet TAKE 1 TABLET BY MOUTH TWICE A DAY   [DISCONTINUED] glipiZIDE (GLUCOTROL XL) 5 MG 24 hr tablet Take 2 tablets (10 mg total) by mouth daily with breakfast.   [DISCONTINUED] insulin degludec (TRESIBA FLEXTOUCH) 100 UNIT/ML FlexTouch Pen INJECT 35 UNITS INTO THE SKIN AT BEDTIME.   No facility-administered encounter medications on file as of 09/29/2023.    ALLERGIES: Allergies  Allergen Reactions   Myrbetriq [Mirabegron] Nausea And Vomiting   Jardiance [Empagliflozin]     Pt does not like this medication    Niacin And Related Itching   Nsaids     Kidney damage    Other Diarrhea and Nausea And Vomiting    PT does not want OPIOIDS   GLP-1 drugs - Nausea, vomiting, diarrhea, belching   Statins Itching    VACCINATION STATUS: Immunization History  Administered Date(s) Administered   Fluad Quad(high Dose 65+) 06/14/2022   Fluad Trivalent(High Dose 65+) 06/24/2023   Influenza Split 07/19/2013   Influenza, High Dose Seasonal PF 06/13/2015, 07/18/2016, 06/11/2017, 07/08/2018, 07/02/2019, 09/15/2020, 07/27/2021   Influenza,inj,Quad PF,6+ Mos 08/08/2014   Influenza-Unspecified 07/15/2012, 07/15/2013, 08/08/2014, 06/13/2015, 07/18/2016   PFIZER Comirnaty(Gray Top)Covid-19 Tri-Sucrose Vaccine 11/27/2019, 12/19/2019, 07/26/2020, 04/20/2021   PPD Test 01/22/2013, 01/22/2013   Pfizer Covid-19 Vaccine Bivalent Booster 65yrs & up 07/27/2021   Pneumococcal Conjugate-13 05/04/2014   Pneumococcal Polysaccharide-23 07/15/2012   Td 02/15/2003   Tdap 05/04/2014, 03/09/2022   Zoster Recombinant(Shingrix) 02/12/2017, 02/13/2017, 06/04/2017    Diabetes He presents for his follow-up diabetic visit. He has type 2 diabetes mellitus. Onset time: He was diagnosed at approximate age of 48 years. His disease course has been improving. There are no hypoglycemic associated symptoms. Pertinent negatives for hypoglycemia include no confusion, headaches, pallor or seizures. Pertinent negatives for diabetes include no chest pain, no fatigue, no polydipsia, no polyphagia, no polyuria and no weakness. There are no hypoglycemic complications. Symptoms are improving. Diabetic complications include heart disease. Risk factors for coronary artery disease include dyslipidemia, diabetes mellitus, family history, obesity, male sex, hypertension, tobacco exposure and sedentary lifestyle. Current diabetic treatment includes insulin injections (He is currently on Tresiba 30 units daily, metformin 1000 mg p.o. twice daily, glipizide 10 mg  p.o. twice daily, Jardiance 10 mg p.o. once a day.). His weight is fluctuating minimally. He is following a generally unhealthy diet. When asked about meal planning, he reported none. He rarely participates in exercise. His home blood glucose trend is decreasing steadily. His breakfast blood glucose range is generally 110-130 mg/dl. His overall blood glucose range is 110-130 mg/dl. Ryan Herrera presents with meter  showing controlled glycemic profile averaging [address] 5-137 for the  last 30 days. His point-of-care A1c is 8.5% improving from 9.3%.  ) An ACE inhibitor/angiotensin II receptor blocker is being taken.  Hyperlipidemia This is a chronic problem. The current episode started more than 1 year ago. The problem is controlled. Exacerbating diseases include diabetes. Pertinent negatives include no chest pain, myalgias or shortness of breath. Treatments tried: He is on Repatha injection as well as Zetia 10 mg p.o. daily. Risk factors for coronary artery disease include dyslipidemia, diabetes mellitus, family history, obesity, male sex, hypertension and a sedentary lifestyle.  Hypertension This is a chronic problem. The current episode started more than 1 year ago. Pertinent negatives include no chest pain, headaches, neck pain, palpitations or shortness of breath. Risk factors for coronary artery disease include family history, dyslipidemia, diabetes mellitus, male gender, obesity and sedentary lifestyle. Past treatments include angiotensin blockers. Hypertensive end-organ damage includes CAD/MI.     Objective:       09/29/2023    1:39 PM 09/23/2023    2:00 PM 09/23/2023    1:24 PM  Vitals with BMI  Height 5' 5.5"  5' 6.5"  Weight 199 lbs 10 oz  197 lbs  BMI 32.7  31.32  Systolic 136 130 604  Diastolic 64 84 73  Pulse 68  66    BP 136/64   Pulse 68   Ht 5' 5.5" (1.664 m)   Wt 199 lb 9.6 oz (90.5 kg)   BMI 32.71 kg/m   Wt Readings from Last 3 Encounters:  09/29/23 199 lb 9.6 oz (90.5 kg)   09/23/23 197 lb (89.4 kg)  09/22/23 196 lb (88.9 kg)     CMP ( most recent) CMP     Component Value Date/Time   NA 132 (L) 06/22/2023 0603   NA 137 01/22/2023 1301   NA 139 04/29/2014 1500   K 3.6 06/22/2023 0603   K 4.2 04/29/2014 1500   CL 100 06/22/2023 0603   CL 103 04/29/2014 1500   CO2 23 06/22/2023 0603   CO2 27 04/29/2014 1500   GLUCOSE 227 (H) 06/22/2023 0603   GLUCOSE 111 (H) 04/29/2014 1500   BUN 15 06/22/2023 0603   BUN 12 01/22/2023 1301   BUN 16 04/29/2014 1500   CREATININE 1.04 06/22/2023 0603   CREATININE 1.14 04/29/2014 1500   CALCIUM 9.0 06/22/2023 0603   CALCIUM 9.2 04/29/2014 1500   PROT 6.2 (L) 06/22/2023 0603   PROT 6.9 01/22/2023 1301   ALBUMIN 3.1 (L) 06/22/2023 0603   ALBUMIN 4.0 01/22/2023 1301   AST 28 06/22/2023 0603   ALT 37 06/22/2023 0603   ALKPHOS 76 06/22/2023 0603   BILITOT 0.5 06/22/2023 0603   BILITOT 0.3 01/22/2023 1301   GFRNONAA >60 06/22/2023 0603   GFRNONAA >60 04/29/2014 1500   GFRAA >60 05/12/2020 1848   GFRAA >60 04/29/2014 1500     Diabetic Labs (most recent): Lab Results  Component Value Date   HGBA1C 8.5 (A) 09/29/2023   HGBA1C 9.2 (A) 05/28/2023   HGBA1C 9.3 (H) 01/22/2023    Lipid Panel     Component Value Date/Time   CHOL 94 02/14/2023 0425   CHOL 131 01/22/2023 1301   TRIG 89 02/14/2023 0425   HDL 29 (L) 02/14/2023 0425   HDL 38 (L) 01/22/2023 1301   CHOLHDL 3.2 02/14/2023 0425   VLDL 18 02/14/2023 0425   LDLCALC 47 02/14/2023 0425   LDLCALC 78 01/22/2023 1301   LABVLDL 15 01/22/2023  1301        Lab Results  Component Value Date   TSH 3.950 01/22/2023   TSH 2.990 10/02/2022   TSH 2.349 03/24/2022   TSH 5.760 (H) 03/12/2022   TSH 4.124 12/21/2018   TSH 5.545 (H) 09/22/2018   TSH 4.84 (H) 04/29/2014   FREET4 1.62 01/22/2023   FREET4 1.44 10/02/2022   FREET4 1.14 (H) 03/24/2022   FREET4 1.01 12/21/2018      Assessment & Plan:   1. DM type 2 causing vascular disease ([name])  -  Ryan Herrera has currently uncontrolled symptomatic type 2 DM since  77 years of age.  [place] presents with meter showing controlled glycemic profile averaging 29562 5-137 for the  last 30 days. His point-of-care A1c is 8.5% improving from 9.3%.   Recent labs reviewed.  - I had a long discussion with him about the progressive nature of diabetes and the pathology behind its complications. -his diabetes is complicated by coronary artery disease status post cardiac bypass in 2008, recurrent coronary artery disease, obesity/sedentary life, comorbid hypertension /hyperlipidemia, history of smoking and he remains at a high risk for more acute and chronic complications which include CAD, CVA, CKD, retinopathy, and neuropathy. These are all discussed in detail with him.  - I discussed all available options of managing his diabetes including de-escalation of medications. I have counseled him on diet  and weight management  by adopting a Whole Food , Plant Predominant  ( WFPP) nutrition as recommended by [employer] of Lifestyle Medicine. Patient is encouraged to switch to  unprocessed or minimally processed  complex starch, adequate protein intake (mainly plant source), minimal liquid fat ( mainly vegetable oils), plenty of fruits, and vegetables.  -  he is advised to stick to a routine mealtimes to eat 3 complete meals a day and snack only when necessary ( to snack only to correct hypoglycemia BG <70 day time or <100 at night).   - he acknowledges that there is a room for improvement in his food and drink choices. - Suggestion is made for him to avoid simple carbohydrates  from his diet including Cakes, Sweet Desserts, Ice Cream, Soda (diet and regular), Sweet Tea, Candies, Chips, Cookies, Store Bought Juices, Alcohol , Artificial Sweeteners,  Coffee Creamer, and "Sugar-free" Products, Lemonade. This will help patient to have more stable blood glucose profile and potentially avoid unintended  weight gain.  The following Lifestyle Medicine recommendations according to American College of Lifestyle Medicine  [facility]) were discussed and and offered to patient and he  agrees to start the journey:  A. Whole Foods, Plant-Based Nutrition comprising of fruits and vegetables, plant-based proteins, whole-grain carbohydrates was discussed in detail with the patient.   A list for source of those nutrients were also provided to the patient.  Patient will use only water or unsweetened tea for hydration. B.  The need to stay away from risky substances including alcohol, smoking; obtaining 7 to 9 hours of restorative sleep, at least 150 minutes of moderate intensity exercise weekly, the importance of healthy social connections,  and stress management techniques were discussed. C.  A full color page of  Calorie density of various food groups per pound showing examples of each food groups was provided to the patient.   - he will be scheduled with Norm Salt, RDN, CDE for individualized diabetes education.  - I have approached him with the following plan to manage  his diabetes and patient agrees:   -Based on  his presentation with significantly improving glycemic profile for his most recent couple of weeks, he will not need prandial insulin for now.  In fact, he is advised to lower his Tresiba to 30 units nightly to avoid inadvertent hypoglycemia.   -In the meantime, he is advised to monitor blood glucose twice a day-daily before breakfast and at bedtime.  He does not want to wear a CGM. -He is advised to continue metformin 1000 mg p.o. twice daily, lower glipizide to 5 mg XL p.o. daily at breakfast.   - he is encouraged to monitor glucose at least 2 times daily- before breakfast and at bed time, call clinic for blood glucose levels less than 70 or above 200 mg /dl fasting N6/EXBM.   - Specific targets for  A1c;  LDL, HDL,  and Triglycerides were discussed with the patient.  2) Blood Pressure  /Hypertension:   His blood pressure is controlled to target.  he is advised to continue his current medications including losartan 25 mg p.o. daily with breakfast .   3) Lipids/Hyperlipidemia:   Review of his recent lipid panel showed controlled LDL at 47.    He does not tolerate statins.   He is advised to continue Repatha 140 mg every 14 days, Zetia 10 mg every night.   Side effects and precautions discussed with him.  If he indeed engages enough for  whole food , plant-based diet, he had a chance to come off of these antilipid medications.  4)  Weight/Diet:  Body mass index is 32.71 kg/m.  -   clearly complicating his diabetes care.   he is  a candidate for weight loss. I discussed with him the fact that loss of 5 - 10% of his  current body weight will have the most impact on his diabetes management.  The above detailed  ACLM recommendations for nutrition, exercise, sleep, social life, avoidance of risky substances, the need for restorative sleep   information will also detailed on discharge instructions.  5) Chronic Care/Health Maintenance:  -he  is on ARB  medications and  is encouraged to initiate and continue to follow up with Ophthalmology, Dentist,  Podiatrist at least yearly or according to recommendations, and advised to   stay away from smoking. I have recommended yearly flu vaccine and pneumonia vaccine at least every 5 years; moderate intensity exercise for up to 150 minutes weekly; and  sleep for 7- 9 hours a day.   6) hypothyroidism: Circumstance of diagnosis not available to review.  His recent thyroid function tests are consistent with appropriate replacement.  He is advised to continue levothyroxine 100 mcg p.o. daily before breakfast.    - We discussed about the correct intake of his thyroid hormone, on empty stomach at fasting, with water, separated by at least 30 minutes from breakfast and other medications,  and separated by more than 4 hours from calcium, iron, multivitamins,  acid reflux medications (PPIs). -Patient is made aware of the fact that thyroid hormone replacement is needed for life, dose to be adjusted by periodic monitoring of thyroid function tests.    - he is  advised to maintain close follow up with Durwin Nora, Lucina Mellow, MD for primary care needs, as well as his other providers for optimal and coordinated care.   I spent  27  minutes in the care of the patient today including review of labs from CMP, Lipids, Thyroid Function, Hematology (current and previous including abstractions from other facilities); face-to-face time discussing  his  blood glucose readings/logs, discussing hypoglycemia and hyperglycemia episodes and symptoms, medications doses, his options of short and long term treatment based on the latest standards of care / guidelines;  discussion about incorporating lifestyle medicine;  and documenting the encounter. Risk reduction counseling performed per USPSTF guidelines to reduce  obesity and cardiovascular risk factors.     Please refer to Patient Instructions for Blood Glucose Monitoring and Insulin/Medications Dosing Guide"  in media tab for additional information. Please  also refer to " Patient Self Inventory" in the Media  tab for reviewed elements of pertinent patient history.  Ryan Herrera participated in the discussions, expressed understanding, and voiced agreement with the above plans.  All questions were answered to his satisfaction. he is encouraged to contact clinic should he have any questions or concerns prior to his return visit.    Follow up plan: - Return in about 4 months (around 01/28/2024) for Bring Meter/CGM Device/Logs- A1c in Office.  Marquis Lunch, MD [facility] [address] [place], [place] [address] Phone: [phone]  Fax: [phone]    09/29/2023, 6:56 PM  This note was partially dictated with voice recognition software. Similar sounding words  can be transcribed inadequately or may not  be corrected upon review.

## 2023-09-29 NOTE — Patient Instructions (Signed)
                                     Advice for Weight Management  -For most of us the best way to lose weight is by diet management. Generally speaking, diet management means consuming less calories intentionally which over time brings about progressive weight loss.  This can be achieved more effectively by avoiding ultra processed carbohydrates, processed meats, unhealthy fats.    It is critically important to know your numbers: how much calorie you are consuming and how much calorie you need. More importantly, our carbohydrates sources should be unprocessed naturally occurring  complex starch food items.  It is always important to balance nutrition also by  appropriate intake of proteins (mainly plant-based), healthy fats/oils, plenty of fruits and vegetables.   -The American College of Lifestyle Medicine (ACL M) recommends nutrition derived mostly from Whole Food, Plant Predominant Sources example an apple instead of applesauce or apple pie. Eat Plenty of vegetables, Mushrooms, fruits, Legumes, Whole Grains, Nuts, seeds in lieu of processed meats, processed snacks/pastries red meat, poultry, eggs.  Use only water or unsweetened tea for hydration.  The College also recommends the need to stay away from risky substances including alcohol, smoking; obtaining 7-9 hours of restorative sleep, at least 150 minutes of moderate intensity exercise weekly, importance of healthy social connections, and being mindful of stress and seek help when it is overwhelming.    -Sticking to a routine mealtime to eat 3 meals a day and avoiding unnecessary snacks is shown to have a big role in weight control. Under normal circumstances, the only time we burn stored energy is when we are hungry, so allow  some hunger to take place- hunger means no food between appropriate meal times, only water.  It is not advisable to starve.   -It is better to avoid simple carbohydrates including:  Cakes, Sweet Desserts, Ice Cream, Soda (diet and regular), Sweet Tea, Candies, Chips, Cookies, Store Bought Juices, Alcohol in Excess of  1-2 drinks a day, Lemonade,  Artificial Sweeteners, Doughnuts, Coffee Creamers, "Sugar-free" Products, etc, etc.  This is not a complete list.....    -Consulting with certified diabetes educators is proven to provide you with the most accurate and current information on diet.  Also, you may be  interested in discussing diet options/exchanges , we can schedule a visit with Ryan Herrera, RDN, CDE for individualized nutrition education.  -Exercise: If you are able: 30 -60 minutes a day ,4 days a week, or 150 minutes of moderate intensity exercise weekly.    The longer the better if tolerated.  Combine stretch, strength, and aerobic activities.  If you were told in the past that you have high risk for cardiovascular diseases, or if you are currently symptomatic, you may seek evaluation by your heart doctor prior to initiating moderate to intense exercise programs.                                  Additional Care Considerations for Diabetes/Prediabetes   -Diabetes  is a chronic disease.  The most important care consideration is regular follow-up with your diabetes care provider with the goal being avoiding or delaying its complications and to take advantage of advances in medications and technology.  If appropriate actions are taken early enough, type 2 diabetes can even be   reversed.  Seek information from the right source.  - Whole Food, Plant Predominant Nutrition is highly recommended: Eat Plenty of vegetables, Mushrooms, fruits, Legumes, Whole Grains, Nuts, seeds in lieu of processed meats, processed snacks/pastries red meat, poultry, eggs as recommended by American College of  Lifestyle Medicine (ACLM).  -Type 2 diabetes is known to coexist with other important comorbidities such as high blood pressure and high cholesterol.  It is critical to control not only the  diabetes but also the high blood pressure and high cholesterol to minimize and delay the risk of complications including coronary artery disease, stroke, amputations, blindness, etc.  The good news is that this diet recommendation for type 2 diabetes is also very helpful for managing high cholesterol and high blood blood pressure.  - Studies showed that people with diabetes will benefit from a class of medications known as ACE inhibitors and statins.  Unless there are specific reasons not to be on these medications, the standard of care is to consider getting one from these groups of medications at an optimal doses.  These medications are generally considered safe and proven to help protect the heart and the kidneys.    - People with diabetes are encouraged to initiate and maintain regular follow-up with eye doctors, foot doctors, dentists , and if necessary heart and kidney doctors.     - It is highly recommended that people with diabetes quit smoking or stay away from smoking, and get yearly  flu vaccine and pneumonia vaccine at least every 5 years.  See above for additional recommendations on exercise, sleep, stress management , and healthy social connections.      

## 2023-09-30 ENCOUNTER — Other Ambulatory Visit: Payer: Self-pay | Admitting: Internal Medicine

## 2023-09-30 ENCOUNTER — Other Ambulatory Visit: Payer: Self-pay | Admitting: Family Medicine

## 2023-09-30 ENCOUNTER — Other Ambulatory Visit: Payer: Self-pay | Admitting: "Endocrinology

## 2023-09-30 DIAGNOSIS — B356 Tinea cruris: Secondary | ICD-10-CM

## 2023-09-30 DIAGNOSIS — E039 Hypothyroidism, unspecified: Secondary | ICD-10-CM

## 2023-10-06 ENCOUNTER — Other Ambulatory Visit: Payer: Self-pay | Admitting: "Endocrinology

## 2023-10-06 ENCOUNTER — Encounter: Payer: Self-pay | Admitting: Dermatology

## 2023-10-06 ENCOUNTER — Ambulatory Visit: Payer: Medicare HMO | Admitting: Dermatology

## 2023-10-06 DIAGNOSIS — L219 Seborrheic dermatitis, unspecified: Secondary | ICD-10-CM

## 2023-10-06 DIAGNOSIS — S80861A Insect bite (nonvenomous), right lower leg, initial encounter: Secondary | ICD-10-CM

## 2023-10-06 DIAGNOSIS — L821 Other seborrheic keratosis: Secondary | ICD-10-CM | POA: Diagnosis not present

## 2023-10-06 DIAGNOSIS — S80862A Insect bite (nonvenomous), left lower leg, initial encounter: Secondary | ICD-10-CM

## 2023-10-06 DIAGNOSIS — W57XXXA Bitten or stung by nonvenomous insect and other nonvenomous arthropods, initial encounter: Secondary | ICD-10-CM

## 2023-10-06 DIAGNOSIS — L578 Other skin changes due to chronic exposure to nonionizing radiation: Secondary | ICD-10-CM

## 2023-10-06 DIAGNOSIS — B958 Unspecified staphylococcus as the cause of diseases classified elsewhere: Secondary | ICD-10-CM

## 2023-10-06 DIAGNOSIS — A491 Streptococcal infection, unspecified site: Secondary | ICD-10-CM | POA: Diagnosis not present

## 2023-10-06 DIAGNOSIS — L0100 Impetigo, unspecified: Secondary | ICD-10-CM

## 2023-10-06 DIAGNOSIS — Z872 Personal history of diseases of the skin and subcutaneous tissue: Secondary | ICD-10-CM

## 2023-10-06 DIAGNOSIS — W908XXA Exposure to other nonionizing radiation, initial encounter: Secondary | ICD-10-CM

## 2023-10-06 MED ORDER — TRIAMCINOLONE ACETONIDE 0.1 % EX CREA
1.0000 | TOPICAL_CREAM | Freq: Two times a day (BID) | CUTANEOUS | 2 refills | Status: AC | PRN
Start: 2023-10-06 — End: ?

## 2023-10-06 MED ORDER — MUPIROCIN CALCIUM 2 % EX CREA
1.0000 | TOPICAL_CREAM | Freq: Two times a day (BID) | CUTANEOUS | 1 refills | Status: DC
Start: 2023-10-06 — End: 2023-11-30

## 2023-10-06 MED ORDER — FLUCONAZOLE 150 MG PO TABS
150.0000 mg | ORAL_TABLET | ORAL | 2 refills | Status: DC
Start: 2023-10-06 — End: 2023-10-15

## 2023-10-06 MED ORDER — FLUOCINONIDE 0.05 % EX SOLN
1.0000 | Freq: Two times a day (BID) | CUTANEOUS | 3 refills | Status: DC | PRN
Start: 2023-10-06 — End: 2023-10-09

## 2023-10-06 MED ORDER — KETOCONAZOLE 2 % EX CREA
1.0000 | TOPICAL_CREAM | Freq: Two times a day (BID) | CUTANEOUS | 0 refills | Status: DC
Start: 2023-10-06 — End: 2023-11-30

## 2023-10-06 NOTE — Patient Instructions (Addendum)
 Skin Education :   I counseled the patient regarding the following: Sun screen (SPF 30 or greater) should be applied during peak UV exposure (between 10am and 2pm) and reapplied after exercise or swimming.  The ABCDEs of melanoma were reviewed with the patient, and the importance of monthly self-examination of moles was emphasized. Should any moles change in shape or color, or itch, bleed or burn, pt will contact our office for evaluation sooner then their interval appointment.  Plan: Sunscreen Recommendations I recommended a broad spectrum sunscreen with a SPF of 30 or higher. I explained that SPF 30 sunscreens block approximately 97 percent of the sun's harmful rays. Sunscreens should be applied at least 15 minutes prior to expected sun exposure and then every 2 hours after that as long as sun exposure continues. If swimming or exercising sunscreen should be reapplied every 45 minutes to an hour after getting wet or sweating. One ounce, or the equivalent of a shot glass full of sunscreen, is adequate to protect the skin not covered by a bathing suit. I also recommended a lip balm with a sunscreen as well. Sun protective clothing can be used in lieu of sunscreen but must be worn the entire time you are exposed to the sun's rays.  Important Information   Due to recent changes in healthcare laws, you may see results of your pathology and/or laboratory studies on MyChart before the doctors have had a chance to review them. We understand that in some cases there may be results that are confusing or concerning to you. Please understand that not all results are received at the same time and often the doctors may need to interpret multiple results in order to provide you with the best plan of care or course of treatment. Therefore, we ask that you please give [place] 2 business days to thoroughly review all your results before contacting the office for clarification. Should we see a critical lab result, you will be  contacted sooner.     If You Need Anything After Your Visit   If you have any questions or concerns for your doctor, please call our main line at [phone]. If no one answers, please leave a voicemail as directed and we will return your call as soon as possible. Messages left after 4 pm will be answered the following business day.    You may also send [place] a message via MyChart. We typically respond to MyChart messages within 1-2 business days.  For prescription refills, please ask your pharmacy to contact our office. Our fax number is [phone].  If you have an urgent issue when the clinic is closed that cannot wait until the next business day, you can page your doctor at the number below.     Please note that while we do our best to be available for urgent issues outside of office hours, we are not available 24/7.    If you have an urgent issue and are unable to reach [place], you may choose to seek medical care at your doctor's office, retail clinic, urgent care center, or emergency room.   If you have a medical emergency, please immediately call 911 or go to the emergency department. In the event of inclement weather, please call our main line at [phone] for an update on the status of any delays or closures.  Dermatology Medication Tips: Please keep the boxes that topical medications come in in order to help keep track of the instructions about where and how to  use these. Pharmacies typically print the medication instructions only on the boxes and not directly on the medication tubes.   If your medication is too expensive, please contact our office at [phone] or send [place] a message through MyChart.    We are unable to tell what your co-pay for medications will be in advance as this is different depending on your insurance coverage. However, we may be able to find a substitute medication at lower cost or fill out paperwork to get insurance to cover a needed medication.    If a  prior authorization is required to get your medication covered by your insurance company, please allow [place] 1-2 business days to complete this process.   Drug prices often vary depending on where the prescription is filled and some pharmacies may offer cheaper prices.   The website www.goodrx.com contains coupons for medications through different pharmacies. The prices here do not account for what the cost may be with help from insurance (it may be cheaper with your insurance), but the website can give you the price if you did not use any insurance.  - You can print the associated coupon and take it with your prescription to the pharmacy.  - You may also stop by our office during regular business hours and pick up a GoodRx coupon card.  - If you need your prescription sent electronically to a different pharmacy, notify our office through [facility] or by phone at [phone]

## 2023-10-09 ENCOUNTER — Other Ambulatory Visit: Payer: Self-pay

## 2023-10-09 DIAGNOSIS — L219 Seborrheic dermatitis, unspecified: Secondary | ICD-10-CM

## 2023-10-09 MED ORDER — FLUOCINONIDE 0.05 % EX SOLN: 1.0000 | Freq: Two times a day (BID) | CUTANEOUS | 3 refills | Status: DC | PRN

## 2023-10-09 NOTE — Progress Notes (Signed)
Wrong quantity sent

## 2023-10-14 ENCOUNTER — Other Ambulatory Visit: Payer: Self-pay | Admitting: Dermatology

## 2023-10-14 DIAGNOSIS — L219 Seborrheic dermatitis, unspecified: Secondary | ICD-10-CM

## 2023-10-15 ENCOUNTER — Other Ambulatory Visit: Payer: Self-pay | Admitting: Dermatology

## 2023-10-15 MED ORDER — FLUCONAZOLE 200 MG PO TABS: 200.0000 mg | ORAL_TABLET | ORAL | 2 refills | Status: AC

## 2023-10-15 NOTE — Progress Notes (Signed)
 Patient needed 200mg  vs 150mg 

## 2023-10-17 ENCOUNTER — Other Ambulatory Visit: Payer: Self-pay | Admitting: Dermatology

## 2023-10-19 NOTE — Assessment & Plan Note (Signed)
 Known history of NAFLD.  He underwent RUQ U/S with elastography that showed changes consistent with NAFLD and median K PA of 11.  34-month follow-up recommended.  Patient requests referral to a different gastroenterologist today as he is not pleased with his care. -New gastroenterology referral placed at patient's request

## 2023-10-19 NOTE — Assessment & Plan Note (Signed)
 Symptoms remain adequately controlled with Protonix 40 mg daily.

## 2023-10-19 NOTE — Assessment & Plan Note (Signed)
 Remains adequately controlled on current antihypertensive regimen.

## 2023-10-20 ENCOUNTER — Telehealth: Payer: Self-pay | Admitting: Internal Medicine

## 2023-10-20 ENCOUNTER — Encounter: Payer: Self-pay | Admitting: Oncology

## 2023-10-20 NOTE — Telephone Encounter (Signed)
 Pt c/o Shortness Of Breath: STAT if SOB developed within the last 24 hours or pt is noticeably SOB on the phone  1. Are you currently SOB (can you hear that pt is SOB on the phone)? No   2. How long have you been experiencing SOB? Couple of weeks   3. Are you SOB when sitting or when up moving around? Moving around   4. Are you currently experiencing any other symptoms? He states he gets sob when walking and if he walks far his back starts to hurt.

## 2023-10-20 NOTE — Telephone Encounter (Signed)
 Pt c/o Shortness Of Breath: STAT if SOB developed within the last 24 hours or pt is noticeably SOB on the phone   1. Are you currently SOB (can you hear that pt is SOB on the phone)? No    2. How long have you been experiencing SOB? Couple of weeks    3. Are you SOB when sitting or when up moving around? Moving around    4. Are you currently experiencing any other symptoms? He states he gets sob when walking and if he walks far his back starts to hurt.       Note   Called and spoke with patient. States he was feeling great in December at his last appointment. Increasing SOB with exertion and now with upper back pain. Okay at rest. Denies any chest pain.

## 2023-10-21 ENCOUNTER — Encounter: Payer: Self-pay | Admitting: Oncology

## 2023-10-23 ENCOUNTER — Ambulatory Visit: Payer: 59 | Attending: Internal Medicine | Admitting: Internal Medicine

## 2023-10-23 VITALS — BP 140/76 | HR 64 | Ht 66.5 in | Wt 202.0 lb

## 2023-10-23 DIAGNOSIS — I10 Essential (primary) hypertension: Secondary | ICD-10-CM

## 2023-10-23 DIAGNOSIS — R0602 Shortness of breath: Secondary | ICD-10-CM | POA: Diagnosis not present

## 2023-10-23 DIAGNOSIS — I2510 Atherosclerotic heart disease of native coronary artery without angina pectoris: Secondary | ICD-10-CM | POA: Diagnosis not present

## 2023-10-23 MED ORDER — FUROSEMIDE 20 MG PO TABS: 20.0000 mg | ORAL_TABLET | Freq: Every day | ORAL | 3 refills | Status: DC
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
## 2023-10-23 NOTE — Patient Instructions (Signed)
Medication Instructions:  Lasix 20 mg daily *If you need a refill on your cardiac medications before your next appointment, please call your pharmacy*   Lab Work: BNP today BMET in 2 weeks  If you have labs (blood work) drawn today and your tests are completely normal, you will receive your results only by: MyChart Message (if you have MyChart) OR A paper copy in the mail If you have any lab test that is abnormal or we need to change your treatment, we will call you to review the results.   Testing/Procedures: Echo next available Your physician has requested that you have an echocardiogram. Echocardiography is a painless test that uses sound waves to create images of your heart. It provides your doctor with information about the size and shape of your heart and how well your heart's chambers and valves are working. This procedure takes approximately one hour. There are no restrictions for this procedure. Please do NOT wear cologne, perfume, aftershave, or lotions (deodorant is allowed). Please arrive 15 minutes prior to your appointment time.  Please note: We ask at that you not bring children with you during ultrasound (echo/ vascular) testing. Due to room size and safety concerns, children are not allowed in the ultrasound rooms during exams. Our front office staff cannot provide observation of children in our lobby area while testing is being conducted. An adult accompanying a patient to their appointment will only be allowed in the ultrasound room at the discretion of the ultrasound technician under special circumstances. We apologize for any inconvenience.    Follow-Up: At [facility], you and your health needs are our priority.  As part of our continuing mission to provide you with exceptional heart care, we have created designated Provider Care Teams.  These Care Teams include your primary Cardiologist (physician) and Advanced Practice Providers (APPs -  Physician Assistants  and Nurse Practitioners) who all work together to provide you with the care you need, when you need it.   Your next appointment:   2 week(s) with any APP or NP  Provider:   Any APP or NP  Other Instructions

## 2023-10-24 ENCOUNTER — Other Ambulatory Visit: Payer: Self-pay | Admitting: "Endocrinology

## 2023-10-24 LAB — BRAIN NATRIURETIC PEPTIDE: BNP: 191.1 pg/mL — ABNORMAL HIGH (ref 0.0–100.0)

## 2023-10-27 ENCOUNTER — Telehealth: Payer: Self-pay | Admitting: Internal Medicine

## 2023-10-27 ENCOUNTER — Telehealth: Payer: Self-pay

## 2023-10-27 NOTE — Telephone Encounter (Signed)
Per chart review labs have not been reviewed/Interpreted by Dr. Laurence Compton for input/advisement

## 2023-10-27 NOTE — Telephone Encounter (Signed)
Copied from CRM [id number]. Topic: Referral - Request for Referral >> Oct 27, 2023  3:18 PM Ivette P wrote: Did the patient discuss referral with their provider in the last year? Yes (If No - schedule appointment) (If Yes - send message)  Appointment offered? No  Type of order/referral and detailed reason for visit:  Nonalcoholic fatty liver disease [K76.0] Gastroesophageal reflux disease, unspecified whether esophagitis present [K21.9] Chronic diarrhea [K52.9]  Preference of office, provider, location:   Bernette Redbird, MD [facility] [address] [place], [place] [address] Phone: [phone]  Kerin Salen, MD [facility] [address], [address] [place], [place] [address] Phone: [phone]   If referral order, have you been seen by this specialty before? No (If Yes, this issue or another issue? When? Where?  Can we respond through MyChart? No, Patient prefers to be called.[phone]

## 2023-10-27 NOTE — Telephone Encounter (Signed)
Patient called to follow-up on his lab results noting his peptides have been very high.  Patient wants a call back to discuss next steps.

## 2023-10-27 NOTE — Telephone Encounter (Signed)
Copied from CRM [id number]. Topic: Referral - Status >> Oct 27, 2023  3:16 PM Ivette P wrote: Reason for CRM: Pt is calling to see about his Gastroenterology referral, has not heard anything back. Requesting a callback [phone].

## 2023-10-27 NOTE — Telephone Encounter (Signed)
Message sent to referral coordinator

## 2023-10-28 ENCOUNTER — Encounter: Payer: Self-pay | Admitting: Internal Medicine

## 2023-10-29 DIAGNOSIS — I509 Heart failure, unspecified: Secondary | ICD-10-CM | POA: Diagnosis not present

## 2023-10-31 ENCOUNTER — Ambulatory Visit (HOSPITAL_COMMUNITY): Payer: 59 | Attending: Internal Medicine

## 2023-10-31 DIAGNOSIS — R0602 Shortness of breath: Secondary | ICD-10-CM | POA: Insufficient documentation

## 2023-10-31 LAB — ECHOCARDIOGRAM COMPLETE
Area-P 1/2: 2.85 cm2
S' Lateral: 3 cm

## 2023-11-02 NOTE — Progress Notes (Unsigned)
Cardiology Clinic Note   Patient Name: Ryan Herrera Date of Encounter: 11/04/2023  Primary Care Provider:  Billie Lade, MD Primary Cardiologist:  Maisie Fus, MD  Patient Profile    Ryan Herrera 78 year old male presents to the clinic today for follow-up evaluation of his coronary artery disease and essential hypertension.  Past Medical History    Past Medical History:  Diagnosis Date   Arthritis    hands   Coronary artery disease    a. s/p CABG in 2008 at [facility] b. re-do CABG at [facility] in 03/2022 with left RA-RI and SVG-PDA   Diabetes mellitus without complication ([name])    GERD (gastroesophageal reflux disease)    H/O poliomyelitis    age 19.  now has "weak stomach muscles"   Hypertension    IBS (irritable bowel syndrome)    Iron deficiency anemia 09/22/2018   Myocardial infarct [facility])    Renal insufficiency    Thyroid disease    Past Surgical History:  Procedure Laterality Date   BACK SURGERY  1967   BACK SURGERY  10/04/2005   benign tumor removal  2004   BIOPSY  11/18/2022   Procedure: BIOPSY;  Surgeon: Lanelle Bal, DO;  Location: [facility] ENDO SUITE;  Service: Endoscopy;;   COLONOSCOPY WITH PROPOFOL N/A 11/18/2022   Procedure: COLONOSCOPY WITH PROPOFOL;  Surgeon: Lanelle Bal, DO;  Location: [facility] ENDO SUITE;  Service: Endoscopy;  Laterality: N/A;  8:30 am, pt did not want early appt. moved down (per PAT)   CORONARY ARTERY BYPASS GRAFT  2008   [facility] - 3 vessel   CORONARY ARTERY BYPASS GRAFT N/A 03/28/2022   Procedure: REDO CORONARY ARTERY BYPASS GRAFTING (CABG) X2 BYPASSES USING OPEN LEFT RADIAL ARTERY AND ENDOSCOPIC RIGHT GREATER SAPHENOUS VEIN HARVEST;  Surgeon: Loreli Slot, MD;  Location: [facility] OR;  Service: Open Heart Surgery;  Laterality: N/A;   CYSTOSCOPY WITH INSERTION OF UROLIFT     ESOPHAGOGASTRODUODENOSCOPY (EGD) WITH PROPOFOL N/A 11/18/2022   Procedure: ESOPHAGOGASTRODUODENOSCOPY (EGD) WITH PROPOFOL;  Surgeon: Lanelle Bal, DO;  Location: [facility] END[facility];  Service: Endoscopy;  Laterality: N/A;   HERNIA REPAIR Bilateral    inguinal   HYDROCELE EXCISION / REPAIR     JOINT REPLACEMENT     left knee x2, rright x1   LEFT HEART CATH AND CORS/GRAFTS ANGIOGRAPHY N/A 03/25/2022   Procedure: LEFT HEART CATH AND CORS/GRAFTS ANGIOGRAPHY;  Surgeon: Corky Crafts, MD;  Location: [facility] INVASIVE CV LAB;  Service: Cardiovascular;  Laterality: N/A;   POLYPECTOMY  11/18/2022   Procedure: POLYPECTOMY;  Surgeon: Lanelle Bal, DO;  Location: [facility];  Service: Endoscopy;;   PROSTATE SURGERY N/A 01/2021   TEE WITHOUT CARDIOVERSION N/A 03/28/2022   Procedure: TRANSESOPHAGEAL ECHOCARDIOGRAM (TEE);  Surgeon: Loreli Slot, MD;  Location: [facility] OR;  Service: Open Heart Surgery;  Laterality: N/A;   TONSILLECTOMY      Allergies  Allergies  Allergen Reactions   Myrbetriq [Mirabegron] Nausea And Vomiting   Jardiance [Empagliflozin]     Pt does not like this medication   Niacin And Related Itching   Nsaids     Kidney damage    Other Diarrhea and Nausea And Vomiting    PT does not want OPIOIDS   GLP-1 drugs - Nausea, vomiting, diarrhea, belching   Statins Itching    History of Present Illness    Ryan Herrera is a PMH of coronary artery disease with three-vessel CABG in  2008, type 2 diabetes, essential hypertension, shortness of breath, and hyperlipidemia.  He was previously seen at [facility]  He had three-vessel CABG in 2008.  He underwent cardiac catheterization 03/25/2022.  He was noted to have occluded vein grafts with patent LIMA-LAD and severe native disease.  He was noted to have significant calcification in his ramus and PDA.  Recommendations for redo CABG were made.  He underwent redo CABG 03/28/2022 by Dr. Dorris Fetch.  He had revised LIMA, left radial to ramus, SVG-PDA.  Prior to his surgery he noted minor shortness of breath.  He was going to undergo knee surgery.  He had stress testing.  His  treadmill stress test was positive.  He underwent left heart cath.  He previously smoked for 37 years and quit over 20 years ago.  His nuclear stress test 12/20/2019 was normal.  He is statin intolerant.  He has been able to tolerate ezetimibe and Repatha.  He was seen in follow-up by Dr. Wyline Mood.  He reported that he had gained some weight.  He did note occasional chest discomfort.  He noted a traumatic bulldozer accident in 1989 which caused painful upper left chest/shoulder pain.  He denied exertional chest discomfort.  He reported that he was building a deck.  He was able to work for about 2 to 3 hours per session.  He denied orthopnea, PND, lower extremity swelling.  He did note that his left leg would swell post CABG after long periods of sitting.  His EKG showed sinus rhythm with first-degree AV block but was otherwise normal.  No scar pattern and no ischemic changes.  He lives with his wife and is on disability.  He was seen in follow-up by Dr. Wyline Mood on 10/23/2023.  He had contacted the cardiology office and noted shortness of breath with exertion.  He noted that he would get short of breath with walking 175-200 steps.  He denied chest pressure.  He was noted to have swollen ankles which was unchanged since his CABG and redo CABG.  He denied PND and orthopnea.  His blood pressure was noted to be 140/76.  It was not felt that his symptoms were related to ischemia.  Follow-up echocardiogram and BNP were ordered.  He was started on furosemide.  Follow-up was planned for 2 weeks.  Echocardiogram 10/31/2023 showed LVEF of 60 to 65% G1 DD mildly dilated left atria, trivial mitral valve regurgitation and no significant appearing changes from his previous study.  BNP showed 191.1 with previous value of 64.2.  He presents to the clinic today for follow-up evaluation and states he continues to note some shortness of breath.  He presents to the clinic today with his wife.  We reviewed his echocardiogram and his  weights.  He reports that when he was in the cardiology clinic in December he was feeling well.  His breathing was normal.  His weight at that time was 199 pounds.  His weight today is 195 pounds.  I explained that I do not think that his symptoms are related to excess fluid or his heart at this time.  I will order a CBC and BMP for further prognostication.  I encouraged him to wear lower extremity support stockings, continue his furosemide as needed, and maintain a weight log.  We will plan follow-up in 2 to 3 months..  I also encouraged him to increase his physical activity as tolerated.  He reports that he is not as physically active with cooler weather.  Today the  denies chest pain,  lower extremity edema, fatigue, palpitations, melena, hematuria, hemoptysis, diaphoresis, weakness, presyncope, syncope, orthopnea, and PND.  Home Medications    Prior to Admission medications   Medication Sig Start Date End Date Taking? Authorizing Provider  acetaminophen (TYLENOL) 650 MG CR tablet Take 1,300 mg by mouth every 8 (eight) hours as needed for pain.    [provider]  allopurinol (ZYLOPRIM) 100 MG tablet Take 1 tablet (100 mg total) by mouth daily. 10/22/21   Donell Beers, FNP  amoxicillin-clavulanate (AUGMENTIN) 875-125 MG tablet Take 1 tablet by mouth every 12 (twelve) hours. 07/04/23   Particia Nearing, PA-C  aspirin EC 81 MG tablet Take 1 tablet (81 mg total) by mouth daily. 06/30/23   Vassie Loll, MD  Blood Glucose Monitoring Suppl (ACCU-CHEK GUIDE ME) w/Device KIT 1 Piece by Does not apply route as directed. 09/29/23   Roma Kayser, MD  Carboxymethylcellulose Sodium (THERATEARS PF OP) Place 1 drop into both eyes daily as needed (dry eyes).    [provider]  carvedilol (COREG) 6.25 MG tablet TAKE 1 TABLET BY MOUTH TWICE A DAY 10/02/23   Maisie Fus, MD  Cholecalciferol (D3 2000) 50 MCG (2000 UT) CAPS Take 1 capsule by mouth daily.    [provider]  cyanocobalamin (VITAMIN B12) 1000 MCG tablet Take 1,000 mcg by mouth daily.    [provider]  Evolocumab (REPATHA SURECLICK) 140 MG/ML SOAJ INJECT 140 MG INTO THE SKIN EVERY 14 (FOURTEEN) DAYS. 03/31/23   Maisie Fus, MD  ezetimibe (ZETIA) 10 MG tablet TAKE 1 TABLET BY MOUTH EVERY DAY 06/26/23   Maisie Fus, MD  fluconazole (DIFLUCAN) 200 MG tablet Take 1 tablet (200 mg total) by mouth once a week. 10/15/23   Gwenith Daily, MD  fluocinonide (LIDEX) 0.05 % external solution Apply 1 Application topically 2 (two) times daily as needed. To scalp 10/09/23   Gwenith Daily, MD  furosemide (LASIX) 20 MG tablet Take 1 tablet (20 mg total) by mouth daily. 10/23/23 01/21/24  Maisie Fus, MD  glipiZIDE (GLUCOTROL XL) 5 MG 24 hr tablet Take 1 tablet (5 mg total) by mouth daily with breakfast. 10/27/23   Nida, Denman George, MD  glucose blood (ACCU-CHEK GUIDE TEST) test strip Use to monitor glucose 2 times daily as instructed 09/29/23   Roma Kayser, MD  hydrochlorothiazide (HYDRODIURIL) 25 MG tablet TAKE 1 TABLET (25 MG TOTAL) BY MOUTH DAILY. 05/26/23   Maisie Fus, MD  insulin degludec (TRESIBA FLEXTOUCH) 100 UNIT/ML FlexTouch Pen Inject 30 Units into the skin at bedtime. 10/02/23   Roma Kayser, MD  ketoconazole (NIZORAL) 2 % cream Apply 1 Application topically 2 (two) times daily. 10/06/23 11/17/23  Gwenith Daily, MD  levothyroxine (SYNTHROID) 100 MCG tablet TAKE 1 TABLET BY MOUTH EVERY DAY BEFORE BREAKFAST 10/02/23   Billie Lade, MD  metFORMIN (GLUCOPHAGE) 1000 MG tablet TAKE 1 TABLET (1,000 MG TOTAL) BY MOUTH TWICE A DAY WITH FOOD 10/02/23   Roma Kayser, MD  mupirocin cream (BACTROBAN) 2 % Apply 1 Application topically 2 (two) times daily. Apply to groin 10/06/23   Gwenith Daily, MD  nystatin cream (MYCOSTATIN) APPLY TO THE GROIN AREA ONCE DAILY 10/19/23   Gwenith Daily, MD  pantoprazole (PROTONIX) 40 MG tablet Take 1 tablet (40 mg total) by  mouth daily. 03/17/23   Billie Lade, MD  tadalafil (CIALIS) 5 MG tablet Take 5 mg by mouth  daily.    [provider]  triamcinolone cream (KENALOG) 0.1 % Apply 1 Application topically 2 (two) times daily as needed. For feet 10/06/23   Gwenith Daily, MD  valACYclovir (VALTREX) 500 MG tablet TAKE 1 TABLET BY MOUTH TWICE A DAY 05/01/23   Gilmore Laroche, FNP    Family History    Family History  Problem Relation Age of Onset   Hypertension Mother    Heart attack Mother    Osteoarthritis Mother    Stroke Father    Hypertension Father    Stroke Sister    Stroke Sister    Colon cancer Neg Hx    Prostate cancer Neg Hx    Cancer - Lung Neg Hx    He indicated that his mother is deceased. He indicated that his father is deceased. He indicated that both of his sisters are deceased. He indicated that the status of his neg hx is unknown.  Social History    Social History   Socioeconomic History   Marital status: Divorced    Spouse name: Not on file   Number of children: 1   Years of education: 12   Highest education level: Master's degree (e.g., MA, MS, MEng, MEd, MSW, MBA)  Occupational History   Occupation: retired  Tobacco Use   Smoking status: Former    Current packs/day: 0.00    Average packs/day: 1 pack/day for 38.0 years (38.0 ttl pk-yrs)    Types: Cigarettes    Start date: 12/1958    Quit date: 12/1996    Years since quitting: 26.9   Smokeless tobacco: Never  Vaping Use   Vaping status: Never Used  Substance and Sexual Activity   Alcohol use: Yes    Comment: occassionally   Drug use: No   Sexual activity: Not Currently  Other Topics Concern   Not on file  Social History Narrative   Lives with his significant other, pt is retired    Teacher, early years/pre Strain: Low Risk  (12/05/2022)   Overall Financial Resource Strain (CARDIA)    Difficulty of Paying Living Expenses: Not hard at all  Food Insecurity: No Food Insecurity  (06/21/2023)   Hunger Vital Sign    Worried About Running Out of Food in the Last Year: Never true    Ran Out of Food in the Last Year: Never true  Transportation Needs: No Transportation Needs (06/21/2023)   PRAPARE - Administrator, Civil Service (Medical): No    Lack of Transportation (Non-Medical): No  Physical Activity: Inactive (12/05/2022)   Exercise Vital Sign    Days of Exercise per Week: 0 days    Minutes of Exercise per Session: 0 min  Stress: No Stress Concern Present (12/05/2022)   [employer] of Occupational Health - Occupational Stress Questionnaire    Feeling of Stress : Only a little  Social Connections: Socially Isolated (12/05/2022)   Social Connection and Isolation Panel [NHANES]    Frequency of Communication with Friends and Family: More than three times a week    Frequency of Social Gatherings with Friends and Family: Once a week    Attends Religious Services: Never    Database administrator or Organizations: No    Attends Banker Meetings: Never    Marital Status: Divorced  Catering manager Violence: Not At Risk (06/21/2023)   Humiliation, Afraid, Rape, and Kick questionnaire    Fear of Current or Ex-Partner: No  Emotionally Abused: No    Physically Abused: No    Sexually Abused: No     Review of Systems    General:  No chills, fever, night sweats or weight changes.  Cardiovascular:  No chest pain, dyspnea on exertion, edema, orthopnea, palpitations, paroxysmal nocturnal dyspnea. Dermatological: No rash, lesions/masses Respiratory: No cough, dyspnea Urologic: No hematuria, dysuria Abdominal:   No nausea, vomiting, diarrhea, bright red blood per rectum, melena, or hematemesis Neurologic:  No visual changes, wkns, changes in mental status. All other systems reviewed and are otherwise negative except as noted above.  Physical Exam    VS:  BP 136/72 (BP Location: Left Arm, Patient Position: Sitting, Cuff Size: Normal)   Pulse  68   Ht 5' 6.5" (1.689 m)   Wt 195 lb 6.4 oz (88.6 kg)   SpO2 95%   BMI 31.07 kg/m  , BMI Body mass index is 31.07 kg/m. GEN: Well nourished, well developed, in no acute distress. HEENT: normal. Neck: Supple, no JVD, carotid bruits, or masses. Cardiac: RRR, no murmurs, rubs, or gallops. No clubbing, cyanosis, ankle edema.  Radials/DP/PT 2+ and equal bilaterally.  Respiratory:  Respirations regular and unlabored, clear to auscultation bilaterally. GI: Soft, nontender, nondistended, BS + x 4. MS: no deformity or atrophy. Skin: warm and dry, no rash. Neuro:  Strength and sensation are intact. Psych: Normal affect.  Accessory Clinical Findings    Recent Labs: 01/22/2023: TSH 3.950 06/22/2023: ALT 37; BUN 15; Creatinine, Ser 1.04; Hemoglobin 11.3; Magnesium 1.9; Platelets 138; Potassium 3.6; Sodium 132 10/23/2023: BNP 191.1   Recent Lipid Panel    Component Value Date/Time   CHOL 94 02/14/2023 0425   CHOL 131 01/22/2023 1301   TRIG 89 02/14/2023 0425   HDL 29 (L) 02/14/2023 0425   HDL 38 (L) 01/22/2023 1301   CHOLHDL 3.2 02/14/2023 0425   VLDL 18 02/14/2023 0425   LDLCALC 47 02/14/2023 0425   LDLCALC 78 01/22/2023 1301         ECG personally reviewed by me today-none today.     IMPRESSIONS   1. Left ventricular ejection fraction, by estimation, is 60 to 65%. Left ventricular ejection fraction by 3D volume is 62 %. The left ventricle has normal function. The left ventricle has no regional wall motion abnormalities. There is moderate concentric left ventricular hypertrophy. Left ventricular diastolic parameters are consistent with Grade I diastolic dysfunction (impaired relaxation). 2. Right ventricular systolic function is normal. The right ventricular size is normal. There is normal pulmonary artery systolic pressure. 3. Left atrial size was mildly dilated. 4. The mitral valve is degenerative. Trivial mitral valve regurgitation. 5. Decrease in LV stroke volume index  without gradient seen on this study. The aortic valve is tricuspid. There is moderate calcification of the aortic valve. There is moderate thickening of the aortic valve. Aortic valve regurgitation is not visualized. Aortic valve sclerosis/calcification is present, without any evidence of aortic stenosis.  Comparison(s): No significant change from prior study. Prior images reviewed side by side. Aortic dimension within normal limits on this study.  FINDINGS Left Ventricle: Left ventricular ejection fraction, by estimation, is 60 to 65%. Left ventricular ejection fraction by 3D volume is 62 %. The left ventricle has normal function. The left ventricle has no regional wall motion abnormalities. Global longitudinal strain performed but not reported based on interpreter judgement due to suboptimal tracking. The left ventricular internal cavity size was normal in size. There is moderate concentric left ventricular hypertrophy. Left ventricular diastolic parameters are  consistent with Grade I diastolic dysfunction (impaired relaxation).  Right Ventricle: The right ventricular size is normal. No increase in right ventricular wall thickness. Right ventricular systolic function is normal. There is normal pulmonary artery systolic pressure. The tricuspid regurgitant velocity is 2.18 m/s, and with an assumed right atrial pressure of 3 mmHg, the estimated right ventricular systolic pressure is 22.0 mmHg.  Left Atrium: Left atrial size was mildly dilated.  Right Atrium: Right atrial size was normal in size.  Pericardium: There is no evidence of pericardial effusion. Presence of epicardial fat layer.  Mitral Valve: The mitral valve is degenerative in appearance. Trivial mitral valve regurgitation.  Tricuspid Valve: The tricuspid valve is normal in structure. Tricuspid valve regurgitation is mild . No evidence of tricuspid stenosis.  Aortic Valve: Decrease in LV stroke volume index without gradient seen on  this study. The aortic valve is tricuspid. There is moderate calcification of the aortic valve. There is moderate thickening of the aortic valve. Aortic valve regurgitation is not visualized. Aortic valve sclerosis/calcification is present, without any evidence of aortic stenosis.  Pulmonic Valve: The pulmonic valve was normal in structure. Pulmonic valve regurgitation is not visualized. No evidence of pulmonic stenosis.  Aorta: The aortic root and ascending aorta are structurally normal, with no evidence of dilitation.  IAS/Shunts: The atrial septum is grossly normal.      Assessment & Plan   1.  DOE-  Reassuring echocardiogram.  Weight today 195.4 pounds.  Continues with some shortness of breath.  Previous weight was 202 pounds.  Reports that it is not as physically active when it is cooler outside.  Has previously had pneumonia and is a former smoker.  Current breathing issues appear to be related to pulmonary issues. Heart healthy low-sodium diet Elevate lower extremities when not active Lower extremity support stockings Continue carvedilol, furosemide as needed   Coronary artery disease-no chest pain today.  Status post CABG and status post redo CABG by Dr. Dorris Fetch 6/23. Continue Repatha, ezetimibe, carvedilol Heart healthy low-sodium high-fiber diet Increase physical activity as tolerated  Hyperlipidemia-LDL 47 on 02/14/2023. High-fiber diet Continue current medical therapy Repeat fasting lipids and LFTs  Essential hypertension-BP today 136/72. Continue carvedilol, HCTZ, furosemide  Type 2 diabetes- SGLT2 inhibitor previously stopped by urologist. Carb modified diet Increase physical activity as tolerated  Disposition: Follow-up with Dr. Wyline Mood or me in 2-3 months.   Thomasene Ripple. [place] Marchiano NP-C     11/04/2023, 4:11 PM [facility] [address] Office [phone] Fax [phone]    I spent 15 minutes examining this  patient, reviewing medications, and using patient centered shared decision making involving their cardiac care.   I spent greater than 20 minutes reviewing their past medical history,  medications, and prior cardiac tests.

## 2023-11-04 ENCOUNTER — Encounter: Payer: Self-pay | Admitting: General Practice

## 2023-11-04 ENCOUNTER — Ambulatory Visit: Payer: 59 | Attending: General Practice | Admitting: General Practice

## 2023-11-04 VITALS — BP 136/72 | HR 68 | Ht 66.5 in | Wt 195.4 lb

## 2023-11-04 DIAGNOSIS — R0602 Shortness of breath: Secondary | ICD-10-CM

## 2023-11-04 DIAGNOSIS — I10 Essential (primary) hypertension: Secondary | ICD-10-CM

## 2023-11-04 DIAGNOSIS — E782 Mixed hyperlipidemia: Secondary | ICD-10-CM | POA: Diagnosis not present

## 2023-11-04 DIAGNOSIS — I2510 Atherosclerotic heart disease of native coronary artery without angina pectoris: Secondary | ICD-10-CM

## 2023-11-04 MED ORDER — FUROSEMIDE 20 MG PO TABS: 20.0000 mg | ORAL_TABLET | ORAL | 1 refills | Status: DC | PRN

## 2023-11-04 NOTE — Patient Instructions (Signed)
Medication Instructions:  TAKE LASIX AS NEEDED-FOR WEIGHT GAIN > 2-3POUNDS OVERNIGHT OR>5 POUNDS IN A WEEK  *If you need a refill on your cardiac medications before your next appointment, please call your pharmacy*  Lab Work: CBC AND BMET TODAY If you have labs (blood work) drawn today and your tests are completely normal, you will receive your results only by:  MyChart Message (if you have MyChart) OR  A paper copy in the mail If you have any lab test that is abnormal or we need to change your treatment, we will call you to review the results.  Other Instructions INCREASE PHYSICAL ACTIVITY-AS TOLERATED PLEASE PURCHASE AND WEAR COMPRESSION STOCKINGS TAKE AND LOG YOUR WEIGHT DAILY  Follow-Up: At [facility], you and your health needs are our priority.  As part of our continuing mission to provide you with exceptional heart care, we have created designated Provider Care Teams.  These Care Teams include your primary Cardiologist (physician) and Advanced Practice Providers (APPs -  Physician Assistants and Nurse Practitioners) who all work together to provide you with the care you need, when you need it.  Your next appointment:   2-3 month(s)  Provider:   Maisie Fus, MD  or Edd Fabian, FNP

## 2023-11-05 LAB — CBC
Hematocrit: 36.8 % — ABNORMAL LOW (ref 37.5–51.0)
Hemoglobin: 11.9 g/dL — ABNORMAL LOW (ref 13.0–17.7)
MCH: 28.2 pg (ref 26.6–33.0)
MCHC: 32.3 g/dL (ref 31.5–35.7)
MCV: 87 fL (ref 79–97)
Platelets: 178 10*3/uL (ref 150–450)
RBC: 4.22 x10E6/uL (ref 4.14–5.80)
RDW: 13.4 % (ref 11.6–15.4)
WBC: 5.6 10*3/uL (ref 3.4–10.8)

## 2023-11-05 LAB — BASIC METABOLIC PANEL
BUN/Creatinine Ratio: 13 (ref 10–24)
BUN: 17 mg/dL (ref 8–27)
CO2: 25 mmol/L (ref 20–29)
Calcium: 9.5 mg/dL (ref 8.6–10.2)
Chloride: 97 mmol/L (ref 96–106)
Creatinine, Ser: 1.27 mg/dL (ref 0.76–1.27)
Glucose: 221 mg/dL — ABNORMAL HIGH (ref 70–99)
Potassium: 4 mmol/L (ref 3.5–5.2)
Sodium: 136 mmol/L (ref 134–144)
eGFR: 57 mL/min/{1.73_m2} — ABNORMAL LOW (ref >59)

## 2023-11-05 NOTE — Patient Instructions (Addendum)
        Great to see you today.  I have refilled the medication(s) we provide.     - Please take medications as prescribed. - Follow up with your primary health provider if any health concerns arises. - If symptoms worsen please contact your primary care provider and/or visit the emergency department.

## 2023-11-05 NOTE — Progress Notes (Signed)
Established Patient Office Visit   Subjective  Patient ID: WAKE CONLEE, male    DOB: 12-22-1945  Age: 78 y.o. MRN: [id number]  Chief Complaint  Patient presents with   Acute Visit    Ear ache , fluid in ears states he feels like ears are congested, they are having a popping noise and jaw pain x 2 wks    He  has a past medical history of Arthritis, Coronary artery disease, Diabetes mellitus without complication ([name]), GERD (gastroesophageal reflux disease), H/O poliomyelitis, Hypertension, IBS (irritable bowel syndrome), Iron deficiency anemia (09/22/2018), Myocardial infarct [facility]), Renal insufficiency, and Thyroid disease.  Muffled Hearing and Otalgia The patient reports bilateral ear pain lasting for 2-3 weeks, described as constant and gradually worsening, with a severity of 6/10. He also complains of earache, a sensation of fluid and congestion in the ears, popping noises, and jaw pain. Associated symptoms include rhinorrhea, but the patient denies ear discharge. There has been no fever. Previous attempts at symptom relief with Q-tip wax removal were ineffective. The patient's medical history is notable for chronic ear infections.            Review of Systems  Constitutional:  Negative for chills and fever.  HENT:  Positive for congestion, ear pain and sore throat.   Respiratory:  Negative for shortness of breath.   Cardiovascular:  Negative for chest pain.      Objective:     BP 139/65   Pulse 65   Ht 5' 6.6" (1.692 m)   Wt 199 lb 1.9 oz (90.3 kg)   SpO2 94%   BMI 31.56 kg/m  BP Readings from Last 3 Encounters:  11/07/23 139/65  11/04/23 136/72  10/23/23 (!) 140/76      Physical Exam Vitals reviewed.  Constitutional:      General: He is not in acute distress.    Appearance: Normal appearance. He is not ill-appearing, toxic-appearing or diaphoretic.  HENT:     Head: Normocephalic.     Right Ear: Tympanic membrane is erythematous.     Left Ear:  Drainage present. Tympanic membrane is erythematous.  Eyes:     General:        Right eye: No discharge.        Left eye: No discharge.     Conjunctiva/sclera: Conjunctivae normal.  Cardiovascular:     Rate and Rhythm: Normal rate.     Pulses: Normal pulses.     Heart sounds: Normal heart sounds.  Pulmonary:     Effort: Pulmonary effort is normal. No respiratory distress.     Breath sounds: Normal breath sounds.  Abdominal:     General: Bowel sounds are normal.     Palpations: Abdomen is soft.     Tenderness: There is no abdominal tenderness. There is no right CVA tenderness, left CVA tenderness or guarding.  Musculoskeletal:        General: Normal range of motion.     Cervical back: Normal range of motion.  Skin:    General: Skin is warm and dry.     Capillary Refill: Capillary refill takes less than 2 seconds.  Neurological:     General: No focal deficit present.     Mental Status: He is alert and oriented to person, place, and time.     Coordination: Coordination normal.     Gait: Gait normal.  Psychiatric:        Mood and Affect: Mood normal.  Behavior: Behavior normal.        Thought Content: Thought content normal.        Judgment: Judgment normal.      No results found for any visits on 11/07/23.  The ASCVD Risk score (Arnett DK, et al., 2019) failed to calculate for the following reasons:   Risk score cannot be calculated because patient has a medical history suggesting prior/existing ASCVD    Assessment & Plan:  Acute otitis media, unspecified otitis media type Assessment & Plan: Start Augmentin (Amoxicillin-Clavulanate) 875-125 mg twice daily x 7 days  Recommend using a nasal decongestant or nasal corticosteroid spray to promote Eustachian tube drainage and reduce ear pressure. Encourage warm compresses over the ear to alleviate discomfort and advise staying hydrated. Follow up in 7-10 days to assess resolution of symptoms. Educate the patient on  completing the full course of antibiotics and monitoring for red flags such as worsening pain, high fever, or new dizziness   Orders: -     Amoxicillin-Pot Clavulanate; Take 1 tablet by mouth 2 (two) times daily for 7 days.  Dispense: 14 tablet; Refill: 0  Other orders -     Ipratropium Bromide; Place 2 sprays into both nostrils every 12 (twelve) hours.  Dispense: 30 mL; Refill: 12    Return if symptoms worsen or fail to improve.   Cruzita Lederer Newman Nip, FNP

## 2023-11-06 ENCOUNTER — Other Ambulatory Visit: Payer: Self-pay

## 2023-11-06 DIAGNOSIS — I2510 Atherosclerotic heart disease of native coronary artery without angina pectoris: Secondary | ICD-10-CM

## 2023-11-06 DIAGNOSIS — R0602 Shortness of breath: Secondary | ICD-10-CM

## 2023-11-07 ENCOUNTER — Ambulatory Visit (INDEPENDENT_AMBULATORY_CARE_PROVIDER_SITE_OTHER): Payer: 59 | Admitting: Family Medicine

## 2023-11-07 ENCOUNTER — Encounter: Payer: Self-pay | Admitting: Oncology

## 2023-11-07 ENCOUNTER — Telehealth: Payer: Self-pay | Admitting: Internal Medicine

## 2023-11-07 ENCOUNTER — Encounter (HOSPITAL_COMMUNITY): Payer: Self-pay | Admitting: General Practice

## 2023-11-07 ENCOUNTER — Encounter: Payer: Self-pay | Admitting: Family Medicine

## 2023-11-07 VITALS — BP 139/65 | HR 65 | Ht 66.6 in | Wt 199.1 lb

## 2023-11-07 DIAGNOSIS — H6690 Otitis media, unspecified, unspecified ear: Secondary | ICD-10-CM | POA: Diagnosis not present

## 2023-11-07 MED ORDER — IPRATROPIUM BROMIDE 0.03 % NA SOLN: 2.0000 | Freq: Two times a day (BID) | NASAL | 12 refills | Status: DC

## 2023-11-07 MED ORDER — AMOXICILLIN-POT CLAVULANATE 875-125 MG PO TABS: 1.0000 | ORAL_TABLET | Freq: Two times a day (BID) | ORAL | 0 refills | Status: DC

## 2023-11-07 NOTE — Assessment & Plan Note (Signed)
 Start Augmentin (Amoxicillin-Clavulanate) 875-125 mg twice daily x 7 days  Recommend using a nasal decongestant or nasal corticosteroid spray to promote Eustachian tube drainage and reduce ear pressure. Encourage warm compresses over the ear to alleviate discomfort and advise staying hydrated. Follow up in 7-10 days to assess resolution of symptoms. Educate the patient on completing the full course of antibiotics and monitoring for red flags such as worsening pain, high fever, or new dizziness

## 2023-11-07 NOTE — Telephone Encounter (Signed)
Patient would like his referral to be setup at [facility] in [place] with Kerin Salen

## 2023-11-09 NOTE — Progress Notes (Unsigned)
GLENN GULLICKSON, male    DOB: 1946-01-01    MRN: [id number]   Brief patient profile:  67  yowm quit smoking 1998 with "bad bronchitis eval at [facility]" but elementary school did fine  referred to pulmonary clinic in [facility]  11/10/2023 by Ninfa Meeker NP  for sob.      History of Present Illness  11/10/2023  Pulmonary/ 1st office eval/ Jaydy Fitzhenry / [place] [facility]  Chief Complaint  Patient presents with   Consult    Sob      Dyspnea:  acute onset while walking up hill workshop to house x 165 Dec 2024  Halfway thru house  Cough: no  Sleep: bed is flat / one pillow s resp  SABA use: none  02: ;none  Cp in middle of chest up near throat   No obvious  other day to day or daytime pattern/variability or assoc excess/ purulent sputum or mucus plugs or hemoptysis or  subjective wheeze or overt sinus or hb symptoms.    Also denies any obvious fluctuation of symptoms with weather or environmental changes or other aggravating or alleviating factors except as outlined above   No unusual exposure hx or h/o childhood pna/ asthma or knowledge of premature birth.  Current Allergies, Complete Past Medical History, Past Surgical History, Family History, and Social History were reviewed in [employer] record.  ROS  The following are not active complaints unless bolded Hoarseness, sore throat, dysphagia, dental problems, itching, sneezing,  nasal congestion or discharge of excess mucus or purulent secretions, ear ache,   fever, chills, sweats, unintended wt loss or wt gain, classically pleuritic  cp,  orthopnea pnd or arm/hand swelling  or leg swelling, presyncope, palpitations, abdominal pain, anorexia, nausea, vomiting, diarrhea  or change in bowel habits or change in bladder habits, change in stools or change in urine, dysuria, hematuria,  rash, arthralgias, visual complaints, headache, numbness, weakness or ataxia or problems with walking or coordination,  change in mood or   memory.            Outpatient Medications Prior to Visit -  - NOTE:   Unable to verify as accurately reflecting what pt takes    Medication Sig Dispense Refill   acetaminophen (TYLENOL) 650 MG CR tablet Take 1,300 mg by mouth every 8 (eight) hours as needed for pain.     allopurinol (ZYLOPRIM) 100 MG tablet Take 1 tablet (100 mg total) by mouth daily. 30 tablet 3   amoxicillin-clavulanate (AUGMENTIN) 875-125 MG tablet Take 1 tablet by mouth 2 (two) times daily for 7 days. 14 tablet 0   aspirin EC 81 MG tablet Take 1 tablet (81 mg total) by mouth daily.     Blood Glucose Monitoring Suppl (ACCU-CHEK GUIDE ME) w/Device KIT 1 Piece by Does not apply route as directed. 1 kit 0   Carboxymethylcellulose Sodium (THERATEARS PF OP) Place 1 drop into both eyes daily as needed (dry eyes).     carvedilol (COREG) 6.25 MG tablet Had not picked up by 11/10/2023   3   Cholecalciferol (D3 2000) 50 MCG (2000 UT) CAPS Take 1 capsule by mouth daily.     cyanocobalamin (VITAMIN B12) 1000 MCG tablet Take 1,000 mcg by mouth daily.     Evolocumab (REPATHA SURECLICK) 140 MG/ML SOAJ INJECT 140 MG INTO THE SKIN EVERY 14 (FOURTEEN) DAYS. 2 mL 11   ezetimibe (ZETIA) 10 MG tablet TAKE 1 TABLET BY MOUTH EVERY DAY 90 tablet 2   fluconazole (  DIFLUCAN) 200 MG tablet Take 1 tablet (200 mg total) by mouth once a week. 12 tablet 2   fluocinonide (LIDEX) 0.05 % external solution Apply 1 Application topically 2 (two) times daily as needed. To scalp 200 mL 3   fluocinonide (LIDEX) 0.05 % external solution Apply 1 Application topically daily.     furosemide (LASIX) 20 MG tablet Take 1 tablet (20 mg total) by mouth as needed (FOR WEIGHT GAIN > 2-3POUNDS OVERNIGHT OR>5 POUNDS IN A WEEK). 20 tablet 1   glipiZIDE (GLUCOTROL XL) 5 MG 24 hr tablet Take 1 tablet (5 mg total) by mouth daily with breakfast. 90 tablet 0   glucose blood (ACCU-CHEK GUIDE TEST) test strip Use to monitor glucose 2 times daily as instructed 100 each 12    hydrochlorothiazide (HYDRODIURIL) 25 MG tablet TAKE 1 TABLET (25 MG TOTAL) BY MOUTH DAILY. 90 tablet 2   insulin degludec (TRESIBA FLEXTOUCH) 100 UNIT/ML FlexTouch Pen Inject 30 Units into the skin at bedtime. 30 mL 0   ipratropium (ATROVENT) 0.03 % nasal spray Place 2 sprays into both nostrils every 12 (twelve) hours. 30 mL 12   ketoconazole (NIZORAL) 2 % cream Apply 1 Application topically 2 (two) times daily. 15 g 0   levothyroxine (SYNTHROID) 100 MCG tablet TAKE 1 TABLET BY MOUTH EVERY DAY BEFORE BREAKFAST 90 tablet 0   metFORMIN (GLUCOPHAGE) 1000 MG tablet TAKE 1 TABLET (1,000 MG TOTAL) BY MOUTH TWICE A DAY WITH FOOD 180 tablet 1   mupirocin cream (BACTROBAN) 2 % Apply 1 Application topically 2 (two) times daily. Apply to groin 22 g 1   nystatin cream (MYCOSTATIN) APPLY TO THE GROIN AREA ONCE DAILY 30 g 1   pantoprazole (PROTONIX) 40 MG tablet Take 1 tablet (40 mg total) by mouth daily. 180 tablet 0   tadalafil (CIALIS) 5 MG tablet Take 5 mg by mouth daily.     triamcinolone cream (KENALOG) 0.1 % Apply 1 Application topically 2 (two) times daily as needed. For feet 80 g 2   valACYclovir (VALTREX) 500 MG tablet TAKE 1 TABLET BY MOUTH TWICE A DAY 180 tablet 2       Past Medical History:  Diagnosis Date   Arthritis    hands   Coronary artery disease    a. s/p CABG in 2008 at [facility] b. re-do CABG at [facility] in 03/2022 with left RA-RI and SVG-PDA   Diabetes mellitus without complication ([name])    GERD (gastroesophageal reflux disease)    H/O poliomyelitis    age 78.  now has "weak stomach muscles"   Hypertension    IBS (irritable bowel syndrome)    Iron deficiency anemia 09/22/2018   Myocardial infarct [facility])    Renal insufficiency    Thyroid disease       Objective:     BP (!) 157/79   Pulse 67   Ht 5' 6.5" (1.689 m)   Wt 200 lb 6.4 oz (90.9 kg)   SpO2 95% Comment: room air  BMI 31.86 kg/m   SpO2: 95 % (room air) RA  Amb verbose wm with tangential responses "I don't  have chest pain, I have chest discomfort"   HEENT : Oropharynx  clear         NECK :  without  apparent JVD/ palpable Nodes/TM    LUNGS: no acc muscle use,  Nl contour chest which is clear to A and P bilaterally without cough on insp or exp maneuvers   CV:  RRR  no s3 or murmur or increase in P2, and no edema   ABD: obese  soft and nontender   MS:  Gait nl   ext warm without deformities Or obvious joint restrictions  calf tenderness, cyanosis or clubbing    SKIN: warm and dry without lesions    NEURO:  alert, approp, nl sensorium with  no motor or cerebellar deficits apparent.   CXR PA and Lateral:   11/10/2023 :    I personally reviewed images and impression is as follows:     No acute findings    Assessment   DOE  assoc with new CP Onset ? Dec 2024 (cards note mid Jan 2025 while listed as taking coreg but never picked it up)  - 11/10/2023   Walked on RA  x  2  lap(s) =  approx 300  ft  @ slow to moderate pace, stopped due to ant midline cp rad to neck)  with lowest 02 sats 96%    This is new onset AP until proven otherwise and clear there is a misunderstanding about what meds he should be taking but the coreg rx by Dr Wyline Mood was never picked up and today he is hypertensive and relatively tachycardic so rec  1) start coreg right away per Dr Verna Czech instructions 2) Pantoprazole (protonix) 40 mg   Take  30-60 min before first meal of the day and Pepcid (famotidine)  20 mg after supper until return to office - this is the best way to tell whether stomach acid is contributing to his problem.   3) slow paced ex and if AP occurs at rest or with less and less activity > [place]  I will message Dr Wyline Mood re my concerns with pts lack of understanding re importance of medication conciliation using the list he is given on his AVS and offered to do this for  him or have him show the next provider  the actual meds to do this at time of ov   Pulmonary f/u is prn   Each maintenance medication  was reviewed in detail including emphasizing most importantly the difference between maintenance and prns and under what circumstances the prns are to be triggered using an action plan format where appropriate.  Total time for H and P, chart review, counseling,  directly observing portions of ambulatory 02 saturation study/ and generating customized AVS unique to this office visit / same day charting  > 60 min high risk complex pt new to me                 Sandrea Hughs, MD 11/10/2023

## 2023-11-10 ENCOUNTER — Encounter (HOSPITAL_COMMUNITY): Payer: Self-pay

## 2023-11-10 ENCOUNTER — Ambulatory Visit (INDEPENDENT_AMBULATORY_CARE_PROVIDER_SITE_OTHER): Payer: 59 | Admitting: Internal Medicine

## 2023-11-10 ENCOUNTER — Telehealth: Payer: Self-pay | Admitting: Internal Medicine

## 2023-11-10 ENCOUNTER — Ambulatory Visit (HOSPITAL_COMMUNITY)
Admission: RE | Admit: 2023-11-10 | Discharge: 2023-11-10 | Disposition: A | Discharge status: Home / Self Care | Payer: 59 | Source: Ambulatory Visit | Attending: Internal Medicine | Admitting: Internal Medicine

## 2023-11-10 ENCOUNTER — Encounter: Payer: Self-pay | Admitting: Internal Medicine

## 2023-11-10 ENCOUNTER — Encounter: Payer: Self-pay | Admitting: Oncology

## 2023-11-10 VITALS — BP 154/72 | HR 69 | Ht 66.5 in | Wt 200.4 lb

## 2023-11-10 DIAGNOSIS — R059 Cough, unspecified: Secondary | ICD-10-CM | POA: Diagnosis not present

## 2023-11-10 DIAGNOSIS — R0602 Shortness of breath: Secondary | ICD-10-CM | POA: Diagnosis not present

## 2023-11-10 DIAGNOSIS — I700 Atherosclerosis of aorta: Secondary | ICD-10-CM | POA: Diagnosis not present

## 2023-11-10 DIAGNOSIS — R0609 Other forms of dyspnea: Secondary | ICD-10-CM | POA: Insufficient documentation

## 2023-11-10 DIAGNOSIS — J984 Other disorders of lung: Secondary | ICD-10-CM | POA: Diagnosis not present

## 2023-11-10 MED ORDER — OFLOXACIN 0.3 % OT SOLN: 5.0000 [drp] | Freq: Every day | OTIC | 0 refills | Status: AC

## 2023-11-10 MED ORDER — FAMOTIDINE 20 MG PO TABS: ORAL_TABLET | ORAL | 11 refills | Status: DC

## 2023-11-10 NOTE — Telephone Encounter (Signed)
Patient came by office can not take the medicine makes him sick., he did take with food still made him nausea sick.  amoxicillin-clavulanate (AUGMENTIN) 875-125 MG tablet   [facility]: [facility] [place]

## 2023-11-10 NOTE — Assessment & Plan Note (Addendum)
Onset ? Dec 2024 (cards note mid Jan 2025 while listed as taking coreg but never picked it up)  - 11/10/2023   Walked on RA  x  2  lap(s) =  approx 300  ft  @ slow to moderate pace, stopped due to ant midline cp rad to neck)  with lowest 02 sats 96%    This is new onset AP until proven otherwise and clear there is a misunderstanding about what meds he should be taking but the coreg rx by Dr Wyline Mood was never picked up and today he is hypertensive and relatively tachycardic so rec  1) start coreg right away per Dr Verna Czech instructions 2) Pantoprazole (protonix) 40 mg   Take  30-60 min before first meal of the day and Pepcid (famotidine)  20 mg after supper until return to office - this is the best way to tell whether stomach acid is contributing to his problem.   3) slow paced ex and if AP occurs at rest or with less and less activity > [place]  I will message Dr Wyline Mood re my concerns with pts lack of understanding re importance of medication conciliation using the list he is given on his AVS and offered to do this for  him or have him show the next provider  the actual meds to do this at time of ov   Pulmonary f/u is prn   Each maintenance medication was reviewed in detail including emphasizing most importantly the difference between maintenance and prns and under what circumstances the prns are to be triggered using an action plan format where appropriate.  Total time for H and P, chart review, counseling,  directly observing portions of ambulatory 02 saturation study/ and generating customized AVS unique to this office visit / same day charting  > 60 min high risk complex pt new to me

## 2023-11-10 NOTE — Patient Instructions (Addendum)
Continue pantoprazole 40 mg Take 30-60 min before first meal of the day and add Pepcid(famotidine) 20 mg each pm after supper  Resume carevidol 6.25 mg twice daily  ([facility]   I strongly recommend you take all your medications to your next visit to verify you have them all and you are not taking any that aren't listed or do the process I outlined today (like balancing a checkbook)   If discomfort getting worse when you start having it at rest go to    Please remember to go to the  x-ray department  @  [facility] for your tests - we will call you with the results when they are available

## 2023-11-10 NOTE — Telephone Encounter (Signed)
Referral has been rerouted as requested :)

## 2023-11-10 NOTE — Telephone Encounter (Signed)
 Patient advised.

## 2023-11-11 DIAGNOSIS — M5412 Radiculopathy, cervical region: Secondary | ICD-10-CM | POA: Diagnosis not present

## 2023-11-12 ENCOUNTER — Encounter (HOSPITAL_COMMUNITY): Payer: 59

## 2023-11-12 DIAGNOSIS — N138 Other obstructive and reflux uropathy: Secondary | ICD-10-CM | POA: Diagnosis not present

## 2023-11-13 ENCOUNTER — Other Ambulatory Visit: Payer: Self-pay | Admitting: "Endocrinology

## 2023-11-13 ENCOUNTER — Telehealth: Payer: Self-pay

## 2023-11-13 MED ORDER — NOVOLOG MIX 70/30 FLEXPEN (70-30) 100 UNIT/ML ~~LOC~~ SUPN: 20.0000 [IU] | PEN_INJECTOR | Freq: Two times a day (BID) | SUBCUTANEOUS | 1 refills | Status: DC

## 2023-11-13 NOTE — Telephone Encounter (Signed)
 Note was already put in by Sudie Ely.

## 2023-11-13 NOTE — Telephone Encounter (Signed)
 Pt called stating you had discussed with pt taking Novolog  at his last appointment. Pt states he interested in trying the Novolog  Flex Pen. [facility]ses [facility] [place].

## 2023-11-13 NOTE — Telephone Encounter (Signed)
 Left a message requesting pt return call to the office.

## 2023-11-14 NOTE — Telephone Encounter (Signed)
 Pt states that there is no way he can take the Novolog  70/30 with a meal. Pt states he wants a regimen like his neighbor has. He wants to be able to take a mealtime insulin  an hr or more after he eats. He states there is no way he will be able to test and take insulin  before he eats. Pt states he will take both Novolog  70/30 and Tresiba . I told him that was not advised and to continue with the Tresiba  that he had until advised by Dr Lenis otherwise. [facility] and [facility] Novolog  70/30 put on hold for now so the pt  does not take both.

## 2023-11-14 NOTE — Telephone Encounter (Signed)
 Advised pt to stay on the tresiba  at bedtime.

## 2023-11-17 ENCOUNTER — Encounter (HOSPITAL_COMMUNITY): Payer: 59

## 2023-11-17 DIAGNOSIS — E119 Type 2 diabetes mellitus without complications: Secondary | ICD-10-CM | POA: Diagnosis not present

## 2023-11-17 DIAGNOSIS — H353131 Nonexudative age-related macular degeneration, bilateral, early dry stage: Secondary | ICD-10-CM | POA: Diagnosis not present

## 2023-11-17 LAB — HM DIABETES EYE EXAM

## 2023-11-18 ENCOUNTER — Ambulatory Visit (HOSPITAL_COMMUNITY): Payer: 59 | Attending: General Practice

## 2023-11-18 DIAGNOSIS — I2510 Atherosclerotic heart disease of native coronary artery without angina pectoris: Secondary | ICD-10-CM | POA: Diagnosis not present

## 2023-11-18 LAB — MYOCARDIAL PERFUSION IMAGING
LV dias vol: 81 mL (ref 62–150)
LV sys vol: 33 mL
Nuc Stress EF: 59 %
Peak HR: 71 {beats}/min
Rest HR: 52 {beats}/min
Rest Nuclear Isotope Dose: 10.9 mCi
SDS: 1
SRS: 1
SSS: 2
ST Depression (mm): 0 mm
Stress Nuclear Isotope Dose: 32.6 mCi
TID: 0.94

## 2023-11-18 MED ORDER — REGADENOSON 0.4 MG/5ML IV SOLN
0.4000 mg | Freq: Once | INTRAVENOUS | Status: AC
  Administered 2023-11-18: 0.4 mg via INTRAVENOUS

## 2023-11-18 MED ORDER — TECHNETIUM TC 99M TETROFOSMIN IV KIT
10.9000 | PACK | Freq: Once | INTRAVENOUS | Status: AC | PRN
Start: 2023-11-18 — End: 2023-11-18
  Administered 2023-11-18: 10.9 via INTRAVENOUS

## 2023-11-18 MED ORDER — TECHNETIUM TC 99M TETROFOSMIN IV KIT
32.6000 | PACK | Freq: Once | INTRAVENOUS | Status: AC | PRN
  Administered 2023-11-18: 32.6 via INTRAVENOUS

## 2023-11-25 DIAGNOSIS — E1122 Type 2 diabetes mellitus with diabetic chronic kidney disease: Secondary | ICD-10-CM | POA: Diagnosis not present

## 2023-11-25 DIAGNOSIS — N1831 Chronic kidney disease, stage 3a: Secondary | ICD-10-CM | POA: Diagnosis not present

## 2023-11-25 DIAGNOSIS — I10 Essential (primary) hypertension: Secondary | ICD-10-CM | POA: Diagnosis not present

## 2023-11-25 DIAGNOSIS — D631 Anemia in chronic kidney disease: Secondary | ICD-10-CM | POA: Diagnosis not present

## 2023-11-27 ENCOUNTER — Other Ambulatory Visit (HOSPITAL_COMMUNITY): Payer: Self-pay

## 2023-11-27 ENCOUNTER — Telehealth: Payer: Self-pay | Admitting: Pharmacy Technician

## 2023-11-27 ENCOUNTER — Other Ambulatory Visit: Payer: Self-pay | Admitting: Family Medicine

## 2023-11-27 ENCOUNTER — Encounter: Payer: Self-pay | Admitting: Oncology

## 2023-11-27 NOTE — Telephone Encounter (Signed)
Pharmacy Patient Advocate Encounter   Received notification from CoverMyMeds that prior authorization for Repatha is required/requested.   Insurance verification completed.   The patient is insured through [employer] .   Per test claim: PA required; PA submitted to above mentioned insurance via CoverMyMeds Key/confirmation #/EOC [id number] Status is pending

## 2023-11-27 NOTE — Telephone Encounter (Signed)
Pharmacy Patient Advocate Encounter  Received notification from [facility] that Prior Authorization for Repatha has been APPROVED from 11/27/23 to 05/26/24. Ran test claim, Copay is $0.00 one month. This test claim was processed through [facility]- copay amounts may vary at other pharmacies due to pharmacy/plan contracts, or as the patient moves through the different stages of their insurance plan.   PA #/Case ID/Reference #: [id number]

## 2023-11-28 ENCOUNTER — Ambulatory Visit: Payer: 59 | Admitting: General Practice

## 2023-11-28 ENCOUNTER — Other Ambulatory Visit: Payer: Self-pay | Admitting: Dermatology

## 2023-11-28 ENCOUNTER — Other Ambulatory Visit: Payer: Self-pay | Admitting: Family Medicine

## 2023-11-28 ENCOUNTER — Other Ambulatory Visit: Payer: Self-pay | Admitting: "Endocrinology

## 2023-11-28 DIAGNOSIS — B958 Unspecified staphylococcus as the cause of diseases classified elsewhere: Secondary | ICD-10-CM

## 2023-11-28 DIAGNOSIS — B356 Tinea cruris: Secondary | ICD-10-CM

## 2023-11-28 DIAGNOSIS — L219 Seborrheic dermatitis, unspecified: Secondary | ICD-10-CM

## 2023-12-01 ENCOUNTER — Telehealth: Payer: Self-pay | Admitting: "Endocrinology

## 2023-12-01 NOTE — Telephone Encounter (Signed)
 Pt cam ein stating that novolog was sent in instead of tresiba, he states he can't take meal time insulin because he doesn't have a regular meal time and isn't at home.  Pt says he was told to up the dose of something but that it messes up his sugars.  Pt will probably need to a call back to clarify.

## 2023-12-01 NOTE — Telephone Encounter (Signed)
 He currently canceled novolog prescription.

## 2023-12-02 NOTE — Telephone Encounter (Unsigned)
 Copied from CRM [id number]. Topic: Referral - Status >> Dec 02, 2023  3:03 PM Gildardo Pounds wrote: Reason for CRM: Ladonna Snide from [facility] states provider is denying transfer of care because the patient has had a fill GI workup by previous [place] physician, and provider does not have anything to add. Callback number is [phone]

## 2023-12-02 NOTE — Telephone Encounter (Signed)
 Tried to call pt, call was interrupted. Tried to call pt back but phone line was busy.

## 2023-12-03 ENCOUNTER — Other Ambulatory Visit: Payer: Self-pay

## 2023-12-03 DIAGNOSIS — E1159 Type 2 diabetes mellitus with other circulatory complications: Secondary | ICD-10-CM

## 2023-12-03 MED ORDER — INSULIN DEGLUDEC 100 UNIT/ML ~~LOC~~ SOPN
35.0000 [IU] | PEN_INJECTOR | Freq: Every day | SUBCUTANEOUS | 0 refills | Status: DC
Start: 2023-12-03 — End: 2023-12-31

## 2023-12-03 NOTE — Telephone Encounter (Signed)
 Left pt a message making him aware we called in Rx for Tresiba 35 units at bedtime to his pharmacy.

## 2023-12-03 NOTE — Telephone Encounter (Signed)
 Left a message making pt aware we sent a Rx for Tresiba 35 units at bedtime to pt pharmacy.

## 2023-12-08 ENCOUNTER — Ambulatory Visit: Payer: 59 | Admitting: *Deleted

## 2023-12-08 VITALS — Ht 66.6 in | Wt 194.0 lb

## 2023-12-08 DIAGNOSIS — Z0000 Encounter for general adult medical examination without abnormal findings: Secondary | ICD-10-CM | POA: Diagnosis not present

## 2023-12-08 NOTE — Progress Notes (Signed)
 Subjective:   Ryan Herrera is a 78 y.o. who presents for a Medicare Wellness preventive visit.  Visit Complete: Virtual I connected with  Ryan Herrera on 12/08/23 by a audio enabled telemedicine application and verified that I am speaking with the correct person using two identifiers.  Patient Location: Home  Provider Location: Office/Clinic  I discussed the limitations of evaluation and management by telemedicine. The patient expressed understanding and agreed to proceed.  Vital Signs: Because this visit was a virtual/telehealth visit, some criteria may be missing or patient reported. Any vitals not documented were not able to be obtained and vitals that have been documented are patient reported.  VideoDeclined- This patient declined Librarian, academic. Therefore the visit was completed with audio only.  AWV Questionnaire: No: Patient Medicare AWV questionnaire was not completed prior to this visit.  Cardiac Risk Factors include: advanced age (>68men, >75 women);diabetes mellitus;dyslipidemia;male gender;hypertension;obesity (BMI >30kg/m2)     Objective:    Today's Vitals   12/08/23 1435  Weight: 194 lb (88 kg)  Height: 5' 6.6" (1.692 m)   Body mass index is 30.75 kg/m.     12/08/2023    2:54 PM 06/21/2023    8:03 PM 06/21/2023    3:30 PM 02/13/2023    1:07 PM 02/13/2023   11:51 AM 02/13/2023    8:05 AM 12/05/2022    2:09 PM  Advanced Directives  Does Patient Have a Medical Advance Directive? Yes  No   No No;Yes  Type of Estate agent of [place];Living will      Living will;Healthcare Power of Attorney  Does patient want to make changes to medical advance directive? No - Patient declined      No - Patient declined  Copy of Healthcare Power of Attorney in Chart? Yes - validated most recent copy scanned in chart (See row information)      No - copy requested  Would patient like information on creating a medical  advance directive?  No - Patient declined  No - Patient declined No - Patient declined  No - Patient declined    Current Medications (verified) Outpatient Encounter Medications as of 12/08/2023  Medication Sig   acetaminophen (TYLENOL) 650 MG CR tablet Take 1,300 mg by mouth every 8 (eight) hours as needed for pain.   allopurinol (ZYLOPRIM) 100 MG tablet Take 1 tablet (100 mg total) by mouth daily.   aspirin EC 81 MG tablet Take 1 tablet (81 mg total) by mouth daily.   Blood Glucose Monitoring Suppl (ACCU-CHEK GUIDE ME) w/Device KIT 1 Piece by Does not apply route as directed.   Carboxymethylcellulose Sodium (THERATEARS PF OP) Place 1 drop into both eyes daily as needed (dry eyes).   carvedilol (COREG) 6.25 MG tablet TAKE 1 TABLET BY MOUTH TWICE A DAY   Cholecalciferol (D3 2000) 50 MCG (2000 UT) CAPS Take 1 capsule by mouth daily.   cyanocobalamin (VITAMIN B12) 1000 MCG tablet Take 1,000 mcg by mouth daily.   Evolocumab (REPATHA SURECLICK) 140 MG/ML SOAJ INJECT 140 MG INTO THE SKIN EVERY 14 (FOURTEEN) DAYS.   ezetimibe (ZETIA) 10 MG tablet TAKE 1 TABLET BY MOUTH EVERY DAY   fluconazole (DIFLUCAN) 200 MG tablet Take 1 tablet (200 mg total) by mouth once a week.   fluocinonide (LIDEX) 0.05 % external solution Apply 1 Application topically 2 (two) times daily as needed. To scalp   fluocinonide (LIDEX) 0.05 % external solution Apply 1 Application topically daily.  furosemide (LASIX) 20 MG tablet Take 1 tablet (20 mg total) by mouth as needed (FOR WEIGHT GAIN > 2-3POUNDS OVERNIGHT OR>5 POUNDS IN A WEEK).   glipiZIDE (GLUCOTROL XL) 5 MG 24 hr tablet Take 1 tablet (5 mg total) by mouth daily with breakfast.   glucose blood (ACCU-CHEK GUIDE TEST) test strip Use to monitor glucose 2 times daily as instructed   hydrochlorothiazide (HYDRODIURIL) 25 MG tablet TAKE 1 TABLET (25 MG TOTAL) BY MOUTH DAILY.   insulin degludec (TRESIBA) 100 UNIT/ML FlexTouch Pen Inject 35 Units into the skin at bedtime.    ipratropium (ATROVENT) 0.03 % nasal spray Place 2 sprays into both nostrils every 12 (twelve) hours.   ketoconazole (NIZORAL) 2 % cream APPLY TO AFFECTED AREA TWICE A DAY   levothyroxine (SYNTHROID) 100 MCG tablet TAKE 1 TABLET BY MOUTH EVERY DAY BEFORE BREAKFAST   metFORMIN (GLUCOPHAGE) 1000 MG tablet TAKE 1 TABLET (1,000 MG TOTAL) BY MOUTH TWICE A DAY WITH FOOD   mupirocin cream (BACTROBAN) 2 % APPLY 1 APPLICATION TOPICALLY 2 (TWO) TIMES DAILY. APPLY TO GROIN   nystatin cream (MYCOSTATIN) APPLY TO THE GROIN AREA ONCE DAILY   pantoprazole (PROTONIX) 40 MG tablet Take 1 tablet (40 mg total) by mouth daily.   tadalafil (CIALIS) 5 MG tablet Take 5 mg by mouth daily.   triamcinolone cream (KENALOG) 0.1 % Apply 1 Application topically 2 (two) times daily as needed. For feet   valACYclovir (VALTREX) 500 MG tablet TAKE 1 TABLET BY MOUTH TWICE A DAY   [DISCONTINUED] famotidine (PEPCID) 20 MG tablet One after supper (Patient not taking: Reported on 12/08/2023)   No facility-administered encounter medications on file as of 12/08/2023.    Allergies (verified) Myrbetriq [mirabegron]; 2,4-d dimethylamine; Jardiance [empagliflozin]; Niacin and related; Nsaids; Other; and Statins   History: Past Medical History:  Diagnosis Date   Arthritis    hands   Coronary artery disease    a. s/p CABG in 2008 at [facility] b. re-do CABG at [facility] in 03/2022 with left RA-RI and SVG-PDA   Diabetes mellitus without complication ([name])    GERD (gastroesophageal reflux disease)    H/O poliomyelitis    age 54.  now has "weak stomach muscles"   Hypertension    IBS (irritable bowel syndrome)    Iron deficiency anemia 09/22/2018   Myocardial infarct [facility])    Renal insufficiency    Thyroid disease    Past Surgical History:  Procedure Laterality Date   BACK SURGERY  1967   BACK SURGERY  10/04/2005   benign tumor removal  2004   BIOPSY  11/18/2022   Procedure: BIOPSY;  Surgeon: Lanelle Bal, DO;  [facility];  Service: Endoscopy;;   COLONOSCOPY WITH PROPOFOL N/A 11/18/2022   Procedure: COLONOSCOPY WITH PROPOFOL;  Surgeon: Lanelle Bal, DO;  Location: [facility];  Service: Endoscopy;  Laterality: N/A;  8:30 am, pt did not want early appt. moved down (per PAT)   CORONARY ARTERY BYPASS GRAFT  2008   [facility] - 3 vessel   CORONARY ARTERY BYPASS GRAFT N/A 03/28/2022   Procedure: REDO CORONARY ARTERY BYPASS GRAFTING (CABG) X2 BYPASSES USING OPEN LEFT RADIAL ARTERY AND ENDOSCOPIC RIGHT GREATER SAPHENOUS VEIN HARVEST;  Surgeon: Loreli Slot, MD;  Location: [facility] OR;  Service: Open Heart Surgery;  Laterality: N/A;   CYSTOSCOPY WITH INSERTION OF UROLIFT     ESOPHAGOGASTRODUODENOSCOPY (EGD) WITH PROPOFOL N/A 11/18/2022   Procedure: ESOPHAGOGASTRODUODENOSCOPY (EGD) WITH PROPOFOL;  Surgeon: Lanelle Bal,  DO;  [facility];  Service: Endoscopy;  Laterality: N/A;   HERNIA REPAIR Bilateral    inguinal   HYDROCELE EXCISION / REPAIR     JOINT REPLACEMENT     left knee x2, rright x1   LEFT HEART CATH AND CORS/GRAFTS ANGIOGRAPHY N/A 03/25/2022   Procedure: LEFT HEART CATH AND CORS/GRAFTS ANGIOGRAPHY;  Surgeon: Corky Crafts, MD;  Location: [facility] INVASIVE CV LAB;  Service: Cardiovascular;  Laterality: N/A;   POLYPECTOMY  11/18/2022   Procedure: POLYPECTOMY;  Surgeon: Lanelle Bal, DO;  Location[facility];  Service: Endoscopy;;   PROSTATE SURGERY N/A 01/2021   TEE WITHOUT CARDIOVERSION N/A 03/28/2022   Procedure: TRANSESOPHAGEAL ECHOCARDIOGRAM (TEE);  Surgeon: Loreli Slot, MD;  Location: [facility] OR;  Service: Open Heart Surgery;  Laterality: N/A;   TONSILLECTOMY     Family History  Problem Relation Age of Onset   Hypertension Mother    Heart attack Mother    Osteoarthritis Mother    Stroke Father    Hypertension Father    Stroke Sister    Stroke Sister    Colon cancer Neg Hx    Prostate cancer Neg Hx    Cancer - Lung Neg Hx    Social History    Socioeconomic History   Marital status: Divorced    Spouse name: Not on file   Number of children: 1   Years of education: 12   Highest education level: Master's degree (e.g., MA, MS, MEng, MEd, MSW, MBA)  Occupational History   Occupation: retired  Tobacco Use   Smoking status: Former    Current packs/day: 0.00    Average packs/day: 1 pack/day for 38.0 years (38.0 ttl pk-yrs)    Types: Cigarettes    Start date: 12/1958    Quit date: 12/1996    Years since quitting: 27.0   Smokeless tobacco: Never  Vaping Use   Vaping status: Never Used  Substance and Sexual Activity   Alcohol use: Yes    Comment: occassionally   Drug use: No   Sexual activity: Not Currently  Other Topics Concern   Not on file  Social History Narrative   Lives with his significant other, pt is retired    Teacher, early years/pre Strain: Low Risk  (12/08/2023)   Overall Financial Resource Strain (CARDIA)    Difficulty of Paying Living Expenses: Not hard at all  Food Insecurity: No Food Insecurity (12/08/2023)   Hunger Vital Sign    Worried About Running Out of Food in the Last Year: Never true    Ran Out of Food in the Last Year: Never true  Transportation Needs: No Transportation Needs (12/08/2023)   PRAPARE - Administrator, Civil Service (Medical): No    Lack of Transportation (Non-Medical): No  Physical Activity: Inactive (12/08/2023)   Exercise Vital Sign    Days of Exercise per Week: 0 days    Minutes of Exercise per Session: 0 min  Stress: No Stress Concern Present (12/08/2023)   [employer] of Occupational Health - Occupational Stress Questionnaire    Feeling of Stress : Not at all  Social Connections: Moderately Integrated (12/08/2023)   Social Connection and Isolation Panel [NHANES]    Frequency of Communication with Friends and Family: More than three times a week    Frequency of Social Gatherings with Friends and Family: More than three times a week     Attends Religious Services: Never  Active Member of Clubs or Organizations: Yes    Attends Engineer, structural: More than 4 times per year    Marital Status: Living with partner    Tobacco Counseling Counseling given: Not Answered    Clinical Intake:  Pre-visit preparation completed: Yes  Pain : No/denies pain     BMI - recorded: 30.75 Nutritional Status: BMI > 30  Obese Nutritional Risks: Nausea/ vomitting/ diarrhea (chronic diarhea, sees GI) Diabetes: Yes (FBS 104 per patient)  How often do you need to have someone help you when you read instructions, pamphlets, or other written materials from your doctor or pharmacy?: 1 - Never  Interpreter Needed?: No  Information entered by :: R. Terressa Evola LPN   Activities of Daily Living     12/08/2023    2:38 PM 06/21/2023    7:16 PM  In your present state of health, do you have any difficulty performing the following activities:  Hearing? 0 0  Vision? 0 0  Comment readers   Difficulty concentrating or making decisions? 0 0  Walking or climbing stairs? 0 0  Dressing or bathing? 0 0  Doing errands, shopping? 0 0  Preparing Food and eating ? N   Using the Toilet? N   In the past six months, have you accidently leaked urine? Y   Do you have problems with loss of bowel control? N   Managing your Medications? N   Managing your Finances? N   Housekeeping or managing your Housekeeping? N     Patient Care Team: Billie Lade, MD as PCP - General (Internal Medicine) Wyline Mood Alben Spittle, MD as PCP - Cardiology (Cardiology) Nyoka Cowden, MD as Consulting Physician (Pulmonary Disease)  Indicate any recent Medical Services you may have received from other than [facility] providers in the past year (date may be approximate).     Assessment:   This is a routine wellness examination for Trace.  Hearing/Vision screen Hearing Screening - Comments:: No issues Vision Screening - Comments:: readers   Goals Addressed              This Visit's Progress    Patient Stated       Wants to finish two books       Depression Screen     12/08/2023    2:49 PM 06/24/2023    1:26 PM 03/17/2023    3:58 PM 02/24/2023   11:17 AM 12/13/2022    3:22 PM 12/05/2022    2:09 PM 12/05/2022    2:08 PM  PHQ 2/9 Scores  PHQ - 2 Score 0 0 0 0 0 0 0  PHQ- 9 Score 0 2 1 1 2       Fall Risk     12/08/2023    2:41 PM 11/07/2023    3:24 PM 06/24/2023    1:26 PM 03/17/2023    3:58 PM 02/24/2023   11:17 AM  Fall Risk   Falls in the past year? 1 0 1 0 1  Number falls in past yr: 0 0 0 0 0  Injury with Fall? 1  1 0 1  Risk for fall due to : History of fall(s);Impaired balance/gait No Fall Risks  No Fall Risks Impaired balance/gait;Impaired mobility  Follow up Falls evaluation completed;Falls prevention discussed Falls evaluation completed  Falls evaluation completed Falls evaluation completed    MEDICARE RISK AT HOME:  Medicare Risk at Home Any stairs in or around the home?: Yes If so, are there any without handrails?:  Yes Home free of loose throw rugs in walkways, pet beds, electrical cords, etc?: Yes Adequate lighting in your home to reduce risk of falls?: Yes Life alert?: No Use of a cane, walker or w/c?: No Grab bars in the bathroom?: No Shower chair or bench in shower?: No Elevated toilet seat or a handicapped toilet?: Yes  TIMED UP AND GO:  Was the test performed?  No  Cognitive Function: 6CIT completed    12/05/2022    2:10 PM  MMSE - Mini Mental State Exam  Not completed: Unable to complete        12/08/2023    2:55 PM 12/05/2022    2:10 PM 11/08/2021    1:46 PM  6CIT Screen  What Year? 0 points 0 points 0 points  What month? 0 points 0 points 0 points  What time? 0 points 0 points 0 points  Count back from 20 0 points 0 points 0 points  Months in reverse 0 points 0 points 0 points  Repeat phrase 0 points 0 points 0 points  Total Score 0 points 0 points 0 points    Immunizations Immunization History   Administered Date(s) Administered   Fluad Quad(high Dose 65+) 06/14/2022   Fluad Trivalent(High Dose 65+) 06/24/2023   Influenza Split 07/19/2013   Influenza, High Dose Seasonal PF 06/13/2015, 07/18/2016, 06/11/2017, 07/08/2018, 07/02/2019, 09/15/2020, 07/27/2021   Influenza,inj,Quad PF,6+ Mos 08/08/2014   Influenza-Unspecified 07/15/2012, 07/15/2013, 08/08/2014, 06/13/2015, 07/18/2016   PFIZER Comirnaty(Gray Top)Covid-19 Tri-Sucrose Vaccine 11/27/2019, 12/19/2019, 07/26/2020, 04/20/2021   PPD Test 01/22/2013, 01/22/2013   Pfizer Covid-19 Vaccine Bivalent Booster 12yrs & up 07/27/2021   Pneumococcal Conjugate-13 05/04/2014   Pneumococcal Polysaccharide-23 07/15/2012   Td 02/15/2003   Tdap 05/04/2014, 03/09/2022   Zoster Recombinant(Shingrix) 02/12/2017, 02/13/2017, 06/04/2017    Screening Tests Health Maintenance  Topic Date Due   COVID-19 Vaccine (6 - 2024-25 season) 06/08/2023   FOOT EXAM  09/14/2023   Diabetic kidney evaluation - Urine ACR  10/03/2023   Medicare Annual Wellness (AWV)  12/05/2023   HEMOGLOBIN A1C  03/29/2024   Diabetic kidney evaluation - eGFR measurement  11/03/2024   OPHTHALMOLOGY EXAM  11/16/2024   DTaP/Tdap/Td (4 - Td or Tdap) 03/09/2032   Pneumonia Vaccine 63+ Years old  Completed   INFLUENZA VACCINE  Completed   Hepatitis C Screening  Completed   Zoster Vaccines- Shingrix  Completed   HPV VACCINES  Aged Out   Colonoscopy  Discontinued    Health Maintenance  Health Maintenance Due  Topic Date Due   COVID-19 Vaccine (6 - 2024-25 season) 06/08/2023   FOOT EXAM  09/14/2023   Diabetic kidney evaluation - Urine ACR  10/03/2023   Medicare Annual Wellness (AWV)  12/05/2023   Health Maintenance Items Addressed: Needs foot exam at next visit   Additional Screening:  Vision Screening: Recommended annual ophthalmology exams for early detection of glaucoma and other disorders of the eye.  Dental Screening: Recommended annual dental exams for proper  oral hygiene  Community Resource Referral / Chronic Care Management: CRR required this visit?  No   CCM required this visit?  No     Plan:     I have personally reviewed and noted the following in the patient's chart:   Medical and social history Use of alcohol, tobacco or illicit drugs  Current medications and supplements including opioid prescriptions. Patient is not currently taking opioid prescriptions. Functional ability and status Nutritional status Physical activity Advanced directives List of other physicians Hospitalizations, surgeries, and ER  visits in previous 12 months Vitals Screenings to include cognitive, depression, and falls Referrals and appointments  In addition, I have reviewed and discussed with patient certain preventive protocols, quality metrics, and best practice recommendations. A written personalized care plan for preventive services as well as general preventive health recommendations were provided to patient.     Sydell Axon, LPN   05/13/5783   After Visit Summary: (MyChart) Due to this being a telephonic visit, the after visit summary with patients personalized plan was offered to patient via MyChart   Notes: Please refer to Routing Comments.

## 2023-12-08 NOTE — Patient Instructions (Signed)
 Mr. Ryan Herrera , Thank you for taking time to come for your Medicare Wellness Visit. I appreciate your ongoing commitment to your health goals. Please review the following plan we discussed and let me know if I can assist you in the future.   Referrals/Orders/Follow-Ups/Clinician Recommendations: Consider updating your Covid Vaccine.  This is a list of the screening recommended for you and due dates:  Health Maintenance  Topic Date Due   COVID-19 Vaccine (6 - 2024-25 season) 06/08/2023   Complete foot exam   09/14/2023   Yearly kidney health urinalysis for diabetes  10/03/2023   Hemoglobin A1C  03/29/2024   Yearly kidney function blood test for diabetes  11/03/2024   Eye exam for diabetics  11/16/2024   Medicare Annual Wellness Visit  12/07/2024   DTaP/Tdap/Td vaccine (4 - Td or Tdap) 03/09/2032   Pneumonia Vaccine  Completed   Flu Shot  Completed   Hepatitis C Screening  Completed   Zoster (Shingles) Vaccine  Completed   HPV Vaccine  Aged Out   Colon Cancer Screening  Discontinued    Advanced directives: (In Chart) A copy of your advanced directives are scanned into your chart should your provider ever need it.  Next Medicare Annual Wellness Visit scheduled for next year: Yes 12/13/24 @ 12:30

## 2023-12-12 ENCOUNTER — Telehealth: Payer: Self-pay | Admitting: Pediatrics

## 2023-12-12 NOTE — Telephone Encounter (Signed)
 Good afternoon Dr. Doy Hutching,   Supervising Provider 12/12/23 PM   Patient is wishing to be seen for chronic diarrhea and sharp abdominal pain. Patient was previous patient with [facility]. Patient is requesting to transfer due to being very unhappy with care provided. States he feels his symptoms have not been addressed or have been improving. States he is unable to discuss symptoms when calling the office. Patient's previous records are in Epic for you to review and advise on scheduling.    Thank you.

## 2023-12-16 NOTE — Telephone Encounter (Signed)
 Patient advised.

## 2023-12-22 ENCOUNTER — Encounter: Payer: Self-pay | Admitting: Oncology

## 2023-12-22 ENCOUNTER — Encounter: Payer: Self-pay | Admitting: Internal Medicine

## 2023-12-22 ENCOUNTER — Ambulatory Visit: Payer: Medicare HMO | Admitting: Internal Medicine

## 2023-12-22 VITALS — BP 160/80 | HR 66 | Ht 66.0 in | Wt 199.2 lb

## 2023-12-22 DIAGNOSIS — K760 Fatty (change of) liver, not elsewhere classified: Secondary | ICD-10-CM | POA: Diagnosis not present

## 2023-12-22 DIAGNOSIS — K529 Noninfective gastroenteritis and colitis, unspecified: Secondary | ICD-10-CM | POA: Diagnosis not present

## 2023-12-22 DIAGNOSIS — K219 Gastro-esophageal reflux disease without esophagitis: Secondary | ICD-10-CM | POA: Diagnosis not present

## 2023-12-22 DIAGNOSIS — E119 Type 2 diabetes mellitus without complications: Secondary | ICD-10-CM | POA: Diagnosis not present

## 2023-12-22 MED ORDER — PANTOPRAZOLE SODIUM 40 MG PO TBEC: 40.0000 mg | DELAYED_RELEASE_TABLET | Freq: Two times a day (BID) | ORAL | 1 refills | Status: DC

## 2023-12-22 NOTE — Progress Notes (Signed)
 Established Patient Office Visit  Subjective   Patient ID: Ryan Herrera, male    DOB: 10/27/1945  Age: 78 y.o. MRN: [id number]  Chief Complaint  Patient presents with   Care Management    Pt. States he is having a problem breathing    Ryan Herrera returns to care today for routine follow-up.  He was last evaluated by me in December 2024.  He was referred to gastroenterology in the setting of nonalcoholic fatty liver disease.  No medication changes were made and 60-month follow-up was arranged.  In the interim, he has been seen by several specialists, including endocrinology, dermatology, cardiology, and pulmonology.  Ryan Herrera reports feeling fairly well today.  He endorses chronic diarrhea as well as reflux symptoms.  He has attempted to transfer care to [facility] but no appointments are available.  Requesting referral to different GI practice today.  Past Medical History:  Diagnosis Date   Arthritis    hands   Coronary artery disease    a. s/p CABG in 2008 at [facility] b. re-do CABG at [facility] in 03/2022 with left RA-RI and SVG-PDA   Diabetes mellitus without complication ([name])    GERD (gastroesophageal reflux disease)    H/O poliomyelitis    age 18.  now has "weak stomach muscles"   Hypertension    IBS (irritable bowel syndrome)    Iron deficiency anemia 09/22/2018   Myocardial infarct [facility])    Renal insufficiency    Thyroid  disease    Past Surgical History:  Procedure Laterality Date   BACK SURGERY  1967   BACK SURGERY  10/04/2005   benign tumor removal  2004   BIOPSY  11/18/2022   Procedure: BIOPSY;  Surgeon: Vinetta Greening, DO;  Location: [facility];  Service: Endoscopy;;   COLONOSCOPY WITH PROPOFOL  N/A 11/18/2022   Procedure: COLONOSCOPY WITH PROPOFOL ;  Surgeon: Vinetta Greening, DO;  Location: [facility];  Service: Endoscopy;  Laterality: N/A;  8:30 am, pt did not want early appt. moved down (per PAT)   CORONARY ARTERY BYPASS GRAFT  2008   [facility] -  3 vessel   CORONARY ARTERY BYPASS GRAFT N/A 03/28/2022   Procedure: REDO CORONARY ARTERY BYPASS GRAFTING (CABG) X2 BYPASSES USING OPEN LEFT RADIAL ARTERY AND ENDOSCOPIC RIGHT GREATER SAPHENOUS VEIN HARVEST;  Surgeon: Zelphia Higashi, MD;  Location: [facility] OR;  Service: Open Heart Surgery;  Laterality: N/A;   CYSTOSCOPY WITH INSERTION OF UROLIFT     ESOPHAGOGASTRODUODENOSCOPY (EGD) WITH PROPOFOL  N/A 11/18/2022   Procedure: ESOPHAGOGASTRODUODENOSCOPY (EGD) WITH PROPOFOL ;  Surgeon: Vinetta Greening, DO;  Location: [facility];  Service: Endoscopy;  Laterality: N/A;   HERNIA REPAIR Bilateral    inguinal   HYDROCELE EXCISION / REPAIR     JOINT REPLACEMENT     left knee x2, rright x1   LEFT HEART CATH AND CORS/GRAFTS ANGIOGRAPHY N/A 03/25/2022   Procedure: LEFT HEART CATH AND CORS/GRAFTS ANGIOGRAPHY;  Surgeon: Lucendia Rusk, MD;  Location: [facility] INVASIVE CV LAB;  Service: Cardiovascular;  Laterality: N/A;   POLYPECTOMY  11/18/2022   Procedure: POLYPECTOMY;  Surgeon: Vinetta Greening, DO;  Location: [facility];  Service: Endoscopy;;   PROSTATE SURGERY N/A 01/2021   TEE WITHOUT CARDIOVERSION N/A 03/28/2022   Procedure: TRANSESOPHAGEAL ECHOCARDIOGRAM (TEE);  Surgeon: Zelphia Higashi, MD;  Location: [facility] OR;  Service: Open Heart Surgery;  Laterality: N/A;   TONSILLECTOMY     Social History   Tobacco Use   Smoking  status: Former    Current packs/day: 0.00    Average packs/day: 1 pack/day for 38.0 years (38.0 ttl pk-yrs)    Types: Cigarettes    Start date: 12/1958    Quit date: 12/1996    Years since quitting: 27.1   Smokeless tobacco: Never  Vaping Use   Vaping status: Never Used  Substance Use Topics   Alcohol use: Yes    Comment: occassionally   Drug use: No   Family History  Problem Relation Age of Onset   Hypertension Mother    Heart attack Mother    Osteoarthritis Mother    Stroke Father    Hypertension Father    Stroke Sister    Stroke Sister    Colon cancer  Neg Hx    Prostate cancer Neg Hx    Cancer - Lung Neg Hx    Allergies  Allergen Reactions   Myrbetriq [Mirabegron] Nausea And Vomiting   2,4-D Dimethylamine Other (See Comments)   Jardiance  [Empagliflozin ]     Pt does not like this medication   Niacin And Related Itching   Nsaids Other (See Comments)    Kidney damage  Non-steroidal anti-inflammatory agent (product)   Other Diarrhea and Nausea And Vomiting    PT does not want OPIOIDS   GLP-1 drugs - Nausea, vomiting, diarrhea, belching   Statins Itching   Review of Systems  Constitutional:  Negative for chills and fever.  HENT:  Negative for sore throat.   Respiratory:  Negative for cough and shortness of breath.   Cardiovascular:  Negative for chest pain, palpitations and leg swelling.  Gastrointestinal:  Positive for diarrhea (Chronic) and heartburn. Negative for abdominal pain, blood in stool, constipation, nausea and vomiting.  Genitourinary:  Negative for dysuria and hematuria.  Musculoskeletal:  Negative for myalgias.  Skin:  Negative for itching and rash.  Neurological:  Negative for dizziness and headaches.  Psychiatric/Behavioral:  Negative for depression and suicidal ideas.      Objective:     BP (!) 160/80 (BP Location: Right Arm, Patient Position: Sitting, Cuff Size: Large)   Pulse 66   Ht 5\' 6"  (1.676 m)   Wt 199 lb 3.2 oz (90.4 kg)   SpO2 95%   BMI 32.15 kg/m  BP Readings from Last 3 Encounters:  01/19/24 (!) 160/68  12/22/23 (!) 160/80  11/10/23 (!) 154/72   Physical Exam Vitals reviewed.  Constitutional:      General: He is not in acute distress.    Appearance: Normal appearance. He is obese. He is not ill-appearing.  HENT:     Head: Normocephalic and atraumatic.     Right Ear: External ear normal.     Left Ear: External ear normal.     Nose: Nose normal. No congestion or rhinorrhea.     Mouth/Throat:     Mouth: Mucous membranes are moist.     Pharynx: Oropharynx is clear.  Eyes:      General: No scleral icterus.    Extraocular Movements: Extraocular movements intact.     Conjunctiva/sclera: Conjunctivae normal.     Pupils: Pupils are equal, round, and reactive to light.  Cardiovascular:     Rate and Rhythm: Normal rate and regular rhythm.     Pulses: Normal pulses.     Heart sounds: Normal heart sounds. No murmur heard. Pulmonary:     Effort: Pulmonary effort is normal.     Breath sounds: Normal breath sounds. No wheezing, rhonchi or rales.  Abdominal:  General: Abdomen is flat. Bowel sounds are normal. There is no distension.     Palpations: Abdomen is soft.     Tenderness: There is no abdominal tenderness.  Musculoskeletal:        General: No swelling or deformity. Normal range of motion.     Cervical back: Normal range of motion.  Skin:    General: Skin is warm and dry.     Capillary Refill: Capillary refill takes less than 2 seconds.  Neurological:     General: No focal deficit present.     Mental Status: He is alert and oriented to person, place, and time.     Motor: No weakness.  Psychiatric:        Mood and Affect: Mood normal.        Behavior: Behavior normal.        Thought Content: Thought content normal.     Last CBC Lab Results  Component Value Date   WBC 5.6 11/04/2023   HGB 11.9 (L) 11/04/2023   HCT 36.8 (L) 11/04/2023   MCV 87 11/04/2023   MCH 28.2 11/04/2023   RDW 13.4 11/04/2023   PLT 178 11/04/2023   Last metabolic panel Lab Results  Component Value Date   GLUCOSE 221 (H) 11/04/2023   NA 136 11/04/2023   K 4.0 11/04/2023   CL 97 11/04/2023   CO2 25 11/04/2023   BUN 17 11/04/2023   CREATININE 1.27 11/04/2023   EGFR 57 (L) 11/04/2023   CALCIUM  9.5 11/04/2023   PHOS 3.8 06/22/2023   PROT 6.2 (L) 06/22/2023   ALBUMIN  3.1 (L) 06/22/2023   LABGLOB 2.9 01/22/2023   AGRATIO 1.4 01/22/2023   BILITOT 0.5 06/22/2023   ALKPHOS 76 06/22/2023   AST 28 06/22/2023   ALT 37 06/22/2023   ANIONGAP 9 06/22/2023   Last lipids Lab  Results  Component Value Date   CHOL 94 02/14/2023   HDL 29 (L) 02/14/2023   LDLCALC 47 02/14/2023   TRIG 89 02/14/2023   CHOLHDL 3.2 02/14/2023   Last hemoglobin A1c Lab Results  Component Value Date   HGBA1C 8.5 (A) 09/29/2023   Last thyroid  functions Lab Results  Component Value Date   TSH 3.950 01/22/2023   T3TOTAL 74 03/24/2022   Last vitamin D  Lab Results  Component Value Date   VD25OH 40.5 01/22/2023   Last vitamin B12 and Folate Lab Results  Component Value Date   VITAMINB12 573 09/22/2018   FOLATE 16.6 09/22/2018     Assessment & Plan:   Problem List Items Addressed This Visit       Chronic diarrhea - Primary   Symptoms are largely unchanged.  Placing new GI referral today is otherwise documented.      GERD (gastroesophageal reflux disease)   He feels that symptoms are poorly controlled with pantoprazole  40 mg daily.  Recommend increasing frequency of pantoprazole  to twice daily.      Nonalcoholic fatty liver disease   Documented history of NAFLD with median K PA of 11 on previous RUQ U/S with elastography.  Most recently, he was referred to [facility] at his request but no appointments are available.  Will place referral to [facility] in [place] at patient's request today.      Diabetes mellitus type 2 without retinopathy ([name])   A1c 8.5 on labs from December.  Endocrinology follow-up is scheduled for 5/5.      Return in about 3 months (around 03/23/2024).   Tobi Fortes, MD

## 2023-12-22 NOTE — Patient Instructions (Signed)
 It was a pleasure to see you today.  Thank you for giving [place] the opportunity to be involved in your care.  Below is a brief recap of your visit and next steps.  We will plan to see you again in 3 months.  Summary Increase pantoprazole 40 twice daily New GI referral placed Follow up in 3 months

## 2023-12-23 ENCOUNTER — Encounter: Payer: Self-pay | Admitting: Oncology

## 2023-12-24 ENCOUNTER — Telehealth: Payer: Self-pay

## 2023-12-24 DIAGNOSIS — E1159 Type 2 diabetes mellitus with other circulatory complications: Secondary | ICD-10-CM

## 2023-12-24 NOTE — Telephone Encounter (Signed)
 Pt called stating he was seen by his PCP on the 17th. States he and his PCP feel he is experiencing a reaction to metformin. States he has been experiencing chest pain, difficulty breathing, diarrhea at times. States he has had several test that have resulted WNL. States PCP suggested he try taking extended release Metformin. Please advise.

## 2023-12-25 MED ORDER — METFORMIN HCL ER 500 MG PO TB24: 500.0000 mg | ORAL_TABLET | Freq: Every day | ORAL | 0 refills | Status: DC

## 2023-12-25 NOTE — Telephone Encounter (Signed)
 Left a message making pt aware Dr.Nida is in agreement with trying Metformin XR 500mg  daily at breakfast. Rx for Metformin XR 500mg  po daily at breakfast # 90 sent to [facility] per Dr.Nida's orders.

## 2023-12-25 NOTE — Addendum Note (Signed)
 Addended by: Derrell Lolling on: 12/25/2023 04:11 PM   Modules accepted: Orders

## 2023-12-30 ENCOUNTER — Encounter: Payer: Self-pay | Admitting: Podiatry

## 2023-12-30 ENCOUNTER — Ambulatory Visit (INDEPENDENT_AMBULATORY_CARE_PROVIDER_SITE_OTHER): Payer: Medicare HMO | Admitting: Podiatry

## 2023-12-30 DIAGNOSIS — B351 Tinea unguium: Secondary | ICD-10-CM | POA: Diagnosis not present

## 2023-12-30 DIAGNOSIS — M79675 Pain in left toe(s): Secondary | ICD-10-CM | POA: Diagnosis not present

## 2023-12-30 DIAGNOSIS — M79674 Pain in right toe(s): Secondary | ICD-10-CM | POA: Diagnosis not present

## 2023-12-30 DIAGNOSIS — E119 Type 2 diabetes mellitus without complications: Secondary | ICD-10-CM

## 2023-12-30 NOTE — Progress Notes (Signed)
This patient returns to my office for at risk foot care.  This patient requires this care by a professional since this patient will be at risk due to having diabetes.  This patient is unable to cut nails himself since the patient cannot reach his nails.These nails are painful walking and wearing shoes.  This patient presents for at risk foot care today.  General Appearance  Alert, conversant and in no acute stress.  Vascular  Dorsalis pedis and posterior tibial  pulses are palpable  bilaterally.  Capillary return is within normal limits  bilaterally. Temperature is within normal limits  bilaterally.  Neurologic  Senn-Weinstein monofilament wire test within normal limits  bilaterally. Muscle power within normal limits bilaterally.  Nails Thick disfigured discolored nails with subungual debris  from hallux to fifth toes bilaterally. No evidence of bacterial infection or drainage bilaterally.  Orthopedic  No limitations of motion  feet .  No crepitus or effusions noted.  No bony pathology or digital deformities noted.  Skin  normotropic skin with no porokeratosis noted bilaterally.  No signs of infections or ulcers noted.     Onychomycosis  Pain in right toes  Pain in left toes  Consent was obtained for treatment procedures.   Mechanical debridement of nails 1-5  bilaterally performed with a nail nipper.  Filed with dremel without incident.    Return office visit  10 weeks                    Told patient to return for periodic foot care and evaluation due to potential at risk complications.   Reesa Gotschall DPM   

## 2023-12-31 ENCOUNTER — Telehealth: Payer: Self-pay | Admitting: "Endocrinology

## 2023-12-31 DIAGNOSIS — E1159 Type 2 diabetes mellitus with other circulatory complications: Secondary | ICD-10-CM

## 2023-12-31 MED ORDER — TRESIBA FLEXTOUCH 100 UNIT/ML ~~LOC~~ SOPN: 35.0000 [IU] | PEN_INJECTOR | Freq: Every day | SUBCUTANEOUS | 0 refills | Status: DC

## 2023-12-31 NOTE — Addendum Note (Signed)
 Addended by: Derrell Lolling on: 12/31/2023 04:12 PM   Modules accepted: Orders

## 2023-12-31 NOTE — Telephone Encounter (Signed)
 Pt states a generic form of [facility] is not on his formulary. He states that is what was called in last. He is asking for a call back  Call back # [phone]

## 2023-12-31 NOTE — Telephone Encounter (Signed)
 Spoke with pt, he stated he has always used the brand name [facility]. Rx for brand name Tresiba 35 units SQ at bedtime 90 day supply sent to [facility].

## 2024-01-02 ENCOUNTER — Ambulatory Visit: Payer: 59 | Admitting: Internal Medicine

## 2024-01-06 LAB — LAB REPORT - SCANNED: Microalb Creat Ratio: 106

## 2024-01-14 DIAGNOSIS — H40033 Anatomical narrow angle, bilateral: Secondary | ICD-10-CM | POA: Diagnosis not present

## 2024-01-14 DIAGNOSIS — H5203 Hypermetropia, bilateral: Secondary | ICD-10-CM | POA: Diagnosis not present

## 2024-01-16 ENCOUNTER — Ambulatory Visit: Admitting: Internal Medicine

## 2024-01-16 ENCOUNTER — Encounter (HOSPITAL_BASED_OUTPATIENT_CLINIC_OR_DEPARTMENT_OTHER): Payer: Self-pay

## 2024-01-18 NOTE — Progress Notes (Unsigned)
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  ABDOMEN: Soft, non-tender, non-distended MUSCULOSKELETAL:  No edema; No deformity  SKIN: Warm and dry NEUROLOGIC:  Alert and oriented x 3 PSYCHIATRIC:  Normal affect   ASSESSMENT:    1. Coronary artery disease involving native coronary artery of native heart without angina pectoris   2. S/P CABG x 2   3. Hyperlipidemia with target LDL less than 70   4. Primary hypertension   5. Uncontrolled type 2 diabetes mellitus with hypoglycemia, with long-term current use of insulin ([name])    PLAN:    In order of problems listed above:  CAD  CABG x 3 in 2008 Re-do CABG x 2 with left radial-ramus and SVG-PDA 03/2022 - recent reassuring echo 10/2023 and nuclear stress test 11/2023 - continue ASA, coreg  6.25 mg BID, 25 mg HCTZ   Hyperlipidemia with LDL goal < 70 02/14/2023: Cholesterol 94; HDL 29; LDL Cholesterol 47; Triglycerides 89; VLDL 18 Zetia and repatha Statin intolerant   Hypertension - 25 mg hydrochlorothiazide, coreg 6.25 mg BID - he was on 25 mg  losartan prior to CABG --> will restart ARB with close follow up and BMP in 10 days - unfortunately, losartan not covered, will start 20 mg olmesartan - if more BP room needed, consider adding anti-anginal such as imdur vs amlodipine - did not increase coreg due to HR 65   DM2 with hyperglycemia - A1c 9.3% - metformin may have been contributing to his issues and was recently reduced   BMP in 10 days. Follow up in 1 month. Will switch to Dr. Alvis Ba at follow up visit, see post-it       Medication Adjustments/Labs and Tests Ordered: Current medicines are reviewed at length with the patient today.  Concerns regarding medicines are outlined above.  Orders Placed This Encounter  Procedures   Basic metabolic panel with GFR   EKG [id number]   Meds ordered this encounter  Medications   olmesartan (BENICAR) 20 MG tablet    Sig: Take 1 tablet (20 mg total) by mouth daily.    Dispense:  90 tablet    Refill:  3    Patient Instructions  Medication Instructions:  Olmesartan  20 mg daily Furosemide 20 mg daily as needed  *If you need a refill on your cardiac medications before your next appointment, please call your pharmacy*  Lab Work: BMET in 10 -14 days  Testing/Procedures: NONE ordered at this time of appointment   Follow-Up: At [facility], you and your health needs are our priority.  As part of our continuing mission to provide you with exceptional heart care, our providers are all part of one team.  This team includes your primary Cardiologist (physician) and Advanced Practice Providers or APPs (Physician Assistants and Nurse Practitioners) who all work together to provide you with the care you need, when  you need it.  Your next appointment:   3-4 weeks   Provider:   Marcie Sever, PA-C        We recommend signing up for the patient portal called "MyChart".  Sign up information is provided on this After Visit Summary.  MyChart is used to connect with patients for Virtual Visits (Telemedicine).  Patients are able to view lab/test results, encounter notes, upcoming appointments, etc.  Non-urgent messages can be sent to your provider as well.   To learn more about what you can do with MyChart, go to [email or web address].   Other Instructions       1st Floor: - Lobby - Registration  - Pharmacy  - Lab - Cafe  2nd Floor: - PV Lab - Diagnostic Testing (echo, CT, nuclear med)  3rd Floor: - Vacant  4th Floor: - TCTS (cardiothoracic surgery) - AFib Clinic - Structural Heart Clinic - Vascular Surgery  - Vascular Ultrasound  5th Floor: - HeartCare Cardiology (general and EP) - Clinical Pharmacy for coumadin, hypertension, lipid, weight-loss medications, and med management appointments    Valet parking services will be available as well.      Signed, Lamond Pilot, [place]  01/19/2024 4:45 PM    [facility]

## 2024-01-19 ENCOUNTER — Encounter: Payer: Self-pay | Admitting: Physician Assistant

## 2024-01-19 ENCOUNTER — Ambulatory Visit: Attending: Physician Assistant | Admitting: Physician Assistant

## 2024-01-19 VITALS — BP 160/68 | HR 65 | Ht 66.75 in | Wt 192.0 lb

## 2024-01-19 DIAGNOSIS — Z951 Presence of aortocoronary bypass graft: Secondary | ICD-10-CM

## 2024-01-19 DIAGNOSIS — Z794 Long term (current) use of insulin: Secondary | ICD-10-CM

## 2024-01-19 DIAGNOSIS — E11649 Type 2 diabetes mellitus with hypoglycemia without coma: Secondary | ICD-10-CM | POA: Diagnosis not present

## 2024-01-19 DIAGNOSIS — E785 Hyperlipidemia, unspecified: Secondary | ICD-10-CM | POA: Diagnosis not present

## 2024-01-19 DIAGNOSIS — I10 Essential (primary) hypertension: Secondary | ICD-10-CM

## 2024-01-19 DIAGNOSIS — I2510 Atherosclerotic heart disease of native coronary artery without angina pectoris: Secondary | ICD-10-CM | POA: Diagnosis not present

## 2024-01-19 MED ORDER — OLMESARTAN MEDOXOMIL 20 MG PO TABS: 20.0000 mg | ORAL_TABLET | Freq: Every day | ORAL | 3 refills | Status: DC

## 2024-01-19 NOTE — Patient Instructions (Addendum)
 Medication Instructions:  Olmesartan  20 mg daily Furosemide 20 mg daily as needed  *If you need a refill on your cardiac medications before your next appointment, please call your pharmacy*  Lab Work: BMET in 10 -14 days  Testing/Procedures: NONE ordered at this time of appointment   Follow-Up: At [facility], you and your health needs are our priority.  As part of our continuing mission to provide you with exceptional heart care, our providers are all part of one team.  This team includes your primary Cardiologist (physician) and Advanced Practice Providers or APPs (Physician Assistants and Nurse Practitioners) who all work together to provide you with the care you need, when you need it.  Your next appointment:   3-4 weeks   Provider:   Marcie Sever, PA-C        We recommend signing up for the patient portal called "MyChart".  Sign up information is provided on this After Visit Summary.  MyChart is used to connect with patients for Virtual Visits (Telemedicine).  Patients are able to view lab/test results, encounter notes, upcoming appointments, etc.  Non-urgent messages can be sent to your provider as well.   To learn more about what you can do with MyChart, go to [email or web address].   Other Instructions       1st Floor: - Lobby - Registration  - Pharmacy  - Lab - Cafe  2nd Floor: - PV Lab - Diagnostic Testing (echo, CT, nuclear med)  3rd Floor: - Vacant  4th Floor: - TCTS (cardiothoracic surgery) - AFib Clinic - Structural Heart Clinic - Vascular Surgery  - Vascular Ultrasound  5th Floor: - HeartCare Cardiology (general and EP) - Clinical Pharmacy for coumadin, hypertension, lipid, weight-loss medications, and med management appointments    Valet parking services will be available as well.

## 2024-01-20 ENCOUNTER — Telehealth: Payer: Self-pay | Admitting: Physician Assistant

## 2024-01-20 NOTE — Telephone Encounter (Signed)
 Pt's medication was sent to pt's pharmacy as requested. Confirmation received.

## 2024-01-20 NOTE — Telephone Encounter (Signed)
 Pt c/o medication issue:  1. Name of Medication:   olmesartan (BENICAR) 20 MG tablet    2. How are you currently taking this medication (dosage and times per day)? As written  3. Are you having a reaction (difficulty breathing--STAT)? No   4. What is your medication issue? Pharmacy told pt they did not receive script. Please re-send  [id number] - [place], [place] - [address] AT [facility] Phone: [phone]  Fax: [phone]

## 2024-01-29 DIAGNOSIS — N35014 Post-traumatic urethral stricture, male, unspecified: Secondary | ICD-10-CM | POA: Diagnosis not present

## 2024-01-30 NOTE — Assessment & Plan Note (Signed)
 Documented history of NAFLD with median K PA of 11 on previous RUQ U/S with elastography.  Most recently, he was referred to [facility] at his request but no appointments are available.  Will place referral to [facility] in [place] at patient's request today.

## 2024-01-30 NOTE — Assessment & Plan Note (Signed)
 Symptoms are largely unchanged.  Placing new GI referral today is otherwise documented.

## 2024-01-30 NOTE — Assessment & Plan Note (Signed)
 A1c 8.5 on labs from December.  Endocrinology follow-up is scheduled for 5/5.

## 2024-01-30 NOTE — Assessment & Plan Note (Signed)
 He feels that symptoms are poorly controlled with pantoprazole  40 mg daily.  Recommend increasing frequency of pantoprazole  to twice daily.

## 2024-02-03 DIAGNOSIS — Z951 Presence of aortocoronary bypass graft: Secondary | ICD-10-CM | POA: Diagnosis not present

## 2024-02-03 DIAGNOSIS — I2510 Atherosclerotic heart disease of native coronary artery without angina pectoris: Secondary | ICD-10-CM | POA: Diagnosis not present

## 2024-02-03 DIAGNOSIS — I10 Essential (primary) hypertension: Secondary | ICD-10-CM | POA: Diagnosis not present

## 2024-02-03 DIAGNOSIS — E11649 Type 2 diabetes mellitus with hypoglycemia without coma: Secondary | ICD-10-CM | POA: Diagnosis not present

## 2024-02-03 DIAGNOSIS — E785 Hyperlipidemia, unspecified: Secondary | ICD-10-CM | POA: Diagnosis not present

## 2024-02-04 LAB — BASIC METABOLIC PANEL WITH GFR
BUN/Creatinine Ratio: 7 — ABNORMAL LOW (ref 10–24)
BUN: 8 mg/dL (ref 8–27)
CO2: 23 mmol/L (ref 20–29)
Calcium: 9.5 mg/dL (ref 8.6–10.2)
Chloride: 98 mmol/L (ref 96–106)
Creatinine, Ser: 1.15 mg/dL (ref 0.76–1.27)
Glucose: 222 mg/dL — ABNORMAL HIGH (ref 70–99)
Potassium: 4.3 mmol/L (ref 3.5–5.2)
Sodium: 134 mmol/L (ref 134–144)
eGFR: 65 mL/min/{1.73_m2} (ref >59)

## 2024-02-05 ENCOUNTER — Telehealth: Payer: Self-pay

## 2024-02-05 NOTE — Telephone Encounter (Signed)
 Spoke with pts friend Abe Abed. She was notified of pts lab  results. Pt will continue current medications.
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
## 2024-02-09 ENCOUNTER — Ambulatory Visit: Payer: Medicare HMO | Admitting: "Endocrinology

## 2024-02-09 ENCOUNTER — Ambulatory Visit: Attending: Physician Assistant | Admitting: Physician Assistant

## 2024-02-09 ENCOUNTER — Encounter: Payer: Self-pay | Admitting: Physician Assistant

## 2024-02-09 VITALS — BP 122/64 | HR 58 | Ht 66.0 in | Wt 192.0 lb

## 2024-02-09 DIAGNOSIS — I7121 Aneurysm of the ascending aorta, without rupture: Secondary | ICD-10-CM | POA: Diagnosis not present

## 2024-02-09 DIAGNOSIS — Z951 Presence of aortocoronary bypass graft: Secondary | ICD-10-CM | POA: Diagnosis not present

## 2024-02-09 DIAGNOSIS — I10 Essential (primary) hypertension: Secondary | ICD-10-CM

## 2024-02-09 DIAGNOSIS — I2510 Atherosclerotic heart disease of native coronary artery without angina pectoris: Secondary | ICD-10-CM

## 2024-02-09 DIAGNOSIS — Z794 Long term (current) use of insulin: Secondary | ICD-10-CM | POA: Diagnosis not present

## 2024-02-09 DIAGNOSIS — E119 Type 2 diabetes mellitus without complications: Secondary | ICD-10-CM

## 2024-02-09 DIAGNOSIS — E785 Hyperlipidemia, unspecified: Secondary | ICD-10-CM

## 2024-02-09 NOTE — Patient Instructions (Signed)
 Medication Instructions:  NO CHANGES *If you need a refill on your cardiac medications before your next appointment, please call your pharmacy*  Lab Work: NO LABS If you have labs (blood work) drawn today and your tests are completely normal, you will receive your results only by: MyChart Message (if you have MyChart) OR A paper copy in the mail If you have any lab test that is abnormal or we need to change your treatment, we will call you to review the results.  Testing/Procedures: NO TESTING  Follow-Up: At [facility], you and your health needs are our priority.  As part of our continuing mission to provide you with exceptional heart care, our providers are all part of one team.  This team includes your primary Cardiologist (physician) and Advanced Practice Providers or APPs (Physician Assistants and Nurse Practitioners) who all work together to provide you with the care you need, when you need it.  Your next appointment:   3 month(s)  Provider:   Knox Perl, MD only

## 2024-02-22 ENCOUNTER — Other Ambulatory Visit: Payer: Self-pay | Admitting: Internal Medicine

## 2024-02-22 DIAGNOSIS — K219 Gastro-esophageal reflux disease without esophagitis: Secondary | ICD-10-CM

## 2024-02-22 DIAGNOSIS — E785 Hyperlipidemia, unspecified: Secondary | ICD-10-CM

## 2024-02-22 DIAGNOSIS — I2510 Atherosclerotic heart disease of native coronary artery without angina pectoris: Secondary | ICD-10-CM

## 2024-02-24 ENCOUNTER — Other Ambulatory Visit: Payer: Self-pay | Admitting: Dermatology

## 2024-02-24 ENCOUNTER — Other Ambulatory Visit (HOSPITAL_COMMUNITY): Payer: Self-pay

## 2024-02-24 ENCOUNTER — Other Ambulatory Visit: Payer: Self-pay

## 2024-02-24 ENCOUNTER — Other Ambulatory Visit: Payer: Self-pay | Admitting: Internal Medicine

## 2024-02-24 ENCOUNTER — Other Ambulatory Visit: Payer: Self-pay | Admitting: "Endocrinology

## 2024-02-24 DIAGNOSIS — E039 Hypothyroidism, unspecified: Secondary | ICD-10-CM

## 2024-02-24 DIAGNOSIS — L219 Seborrheic dermatitis, unspecified: Secondary | ICD-10-CM

## 2024-02-24 DIAGNOSIS — E1159 Type 2 diabetes mellitus with other circulatory complications: Secondary | ICD-10-CM

## 2024-02-24 MED ORDER — HYDROCHLOROTHIAZIDE 25 MG PO TABS
25.0000 mg | ORAL_TABLET | Freq: Every day | ORAL | 3 refills | Status: DC
Start: 2024-02-24 — End: 2024-05-27
  Filled 2024-02-24: qty 90, 90d supply, fill #0

## 2024-02-25 DIAGNOSIS — H353132 Nonexudative age-related macular degeneration, bilateral, intermediate dry stage: Secondary | ICD-10-CM | POA: Diagnosis not present

## 2024-02-25 DIAGNOSIS — H3589 Other specified retinal disorders: Secondary | ICD-10-CM | POA: Diagnosis not present

## 2024-02-25 DIAGNOSIS — E119 Type 2 diabetes mellitus without complications: Secondary | ICD-10-CM | POA: Diagnosis not present

## 2024-02-25 DIAGNOSIS — H35373 Puckering of macula, bilateral: Secondary | ICD-10-CM | POA: Diagnosis not present

## 2024-02-25 DIAGNOSIS — H04123 Dry eye syndrome of bilateral lacrimal glands: Secondary | ICD-10-CM | POA: Diagnosis not present

## 2024-02-25 DIAGNOSIS — H2000 Unspecified acute and subacute iridocyclitis: Secondary | ICD-10-CM | POA: Diagnosis not present

## 2024-02-25 DIAGNOSIS — Z961 Presence of intraocular lens: Secondary | ICD-10-CM | POA: Diagnosis not present

## 2024-03-04 ENCOUNTER — Encounter: Payer: Self-pay | Admitting: "Endocrinology

## 2024-03-04 ENCOUNTER — Ambulatory Visit (INDEPENDENT_AMBULATORY_CARE_PROVIDER_SITE_OTHER): Admitting: "Endocrinology

## 2024-03-04 VITALS — BP 118/62 | HR 64 | Ht 66.0 in | Wt 197.0 lb

## 2024-03-04 DIAGNOSIS — Z7984 Long term (current) use of oral hypoglycemic drugs: Secondary | ICD-10-CM

## 2024-03-04 DIAGNOSIS — I10 Essential (primary) hypertension: Secondary | ICD-10-CM | POA: Diagnosis not present

## 2024-03-04 DIAGNOSIS — Z794 Long term (current) use of insulin: Secondary | ICD-10-CM | POA: Diagnosis not present

## 2024-03-04 DIAGNOSIS — E1159 Type 2 diabetes mellitus with other circulatory complications: Secondary | ICD-10-CM | POA: Diagnosis not present

## 2024-03-04 DIAGNOSIS — E782 Mixed hyperlipidemia: Secondary | ICD-10-CM

## 2024-03-04 DIAGNOSIS — E039 Hypothyroidism, unspecified: Secondary | ICD-10-CM

## 2024-03-04 LAB — POCT GLYCOSYLATED HEMOGLOBIN (HGB A1C): HbA1c, POC (controlled diabetic range): 8.3 % — AB (ref 0.0–7.0)

## 2024-03-04 MED ORDER — TRESIBA FLEXTOUCH 100 UNIT/ML ~~LOC~~ SOPN: 40.0000 [IU] | PEN_INJECTOR | Freq: Every day | SUBCUTANEOUS | 1 refills | Status: DC

## 2024-03-04 NOTE — Progress Notes (Signed)
 03/04/2024, 6:21 PM   Endocrinology follow-up note  Subjective:    Patient ID: Ryan Herrera, male    DOB: [birth year].  Ryan Herrera is being seen in follow-up after he was feeling consultation for management of currently uncontrolled symptomatic diabetes requested by  Ryan Fortes, MD.   Past Medical History:  Diagnosis Date   Arthritis    hands   Coronary artery disease    a. s/p CABG in 2008 at [facility] b. re-do CABG at [facility] in 03/2022 with left RA-RI and SVG-PDA   Diabetes mellitus without complication ([name])    GERD (gastroesophageal reflux disease)    H/O poliomyelitis    age 78.  now has "weak stomach muscles"   Hypertension    IBS (irritable bowel syndrome)    Iron deficiency anemia 09/22/2018   Myocardial infarct [facility])    Renal insufficiency    Thyroid  disease     Past Surgical History:  Procedure Laterality Date   BACK SURGERY  1967   BACK SURGERY  10/04/2005   benign tumor removal  2004   BIOPSY  11/18/2022   Procedure: BIOPSY;  Surgeon: Vinetta Greening, DO;  Location: [facility];  Service: Endoscopy;;   COLONOSCOPY WITH PROPOFOL  N/A 11/18/2022   Procedure: COLONOSCOPY WITH PROPOFOL ;  Surgeon: Vinetta Greening, DO;  Location: [facility];  Service: Endoscopy;  Laterality: N/A;  8:30 am, pt did not want early appt. moved down (per PAT)   CORONARY ARTERY BYPASS GRAFT  2008   [facility] - 3 vessel   CORONARY ARTERY BYPASS GRAFT N/A 03/28/2022   Procedure: REDO CORONARY ARTERY BYPASS GRAFTING (CABG) X2 BYPASSES USING OPEN LEFT RADIAL ARTERY AND ENDOSCOPIC RIGHT GREATER SAPHENOUS VEIN HARVEST;  Surgeon: Zelphia Higashi, MD;  Location: [facility] OR;  Service: Open Heart Surgery;  Laterality: N/A;   CYSTOSCOPY WITH INSERTION OF UROLIFT     ESOPHAGOGASTRODUODENOSCOPY (EGD) WITH PROPOFOL  N/A 11/18/2022   Procedure: ESOPHAGOGASTRODUODENOSCOPY (EGD) WITH PROPOFOL ;  Surgeon: Vinetta Greening, DO;  Location: [facility];  Service:  Endoscopy;  Laterality: N/A;   HERNIA REPAIR Bilateral    inguinal   HYDROCELE EXCISION / REPAIR     JOINT REPLACEMENT     left knee x2, rright x1   LEFT HEART CATH AND CORS/GRAFTS ANGIOGRAPHY N/A 03/25/2022   Procedure: LEFT HEART CATH AND CORS/GRAFTS ANGIOGRAPHY;  Surgeon: Lucendia Rusk, MD;  Location: [facility] INVASIVE CV LAB;  Service: Cardiovascular;  Laterality: N/A;   POLYPECTOMY  11/18/2022   Procedure: POLYPECTOMY;  Surgeon: Vinetta Greening, DO;  Location: [facility];  Service: Endoscopy;;   PROSTATE SURGERY N/A 01/2021   TEE WITHOUT CARDIOVERSION N/A 03/28/2022   Procedure: TRANSESOPHAGEAL ECHOCARDIOGRAM (TEE);  Surgeon: Zelphia Higashi, MD;  Location: [facility] OR;  Service: Open Heart Surgery;  Laterality: N/A;   TONSILLECTOMY      Social History   Socioeconomic History   Marital status: Divorced    Spouse name: Not on file   Number of children: 1   Years of education: 12   Highest education level: Bachelor's degree (e.g., BA, AB, BS)  Occupational History   Occupation: retired  Tobacco Use   Smoking status: Former    Current packs/day: 0.00    Average packs/day: 1 pack/day for 38.0 years (38.0 ttl pk-yrs)    Types: Cigarettes    Start date: 12/1958    Quit date: 12/1996    Years since quitting: 49.2  Smokeless tobacco: Never  Vaping Use   Vaping status: Never Used  Substance and Sexual Activity   Alcohol use: Yes    Comment: occassionally   Drug use: No   Sexual activity: Not Currently  Other Topics Concern   Not on file  Social History Narrative   Lives with his significant other, pt is retired    Teacher, early years/pre Strain: Low Risk  (12/08/2023)   Overall Financial Resource Strain (CARDIA)    Difficulty of Paying Living Expenses: Not hard at all  Food Insecurity: No Food Insecurity (12/18/2023)   Hunger Vital Sign    Worried About Running Out of Food in the Last Year: Never true    Ran Out of Food in the Last Year:  Never true  Transportation Needs: No Transportation Needs (12/18/2023)   PRAPARE - Administrator, Civil Service (Medical): No    Lack of Transportation (Non-Medical): No  Physical Activity: Inactive (12/08/2023)   Exercise Vital Sign    Days of Exercise per Week: 0 days    Minutes of Exercise per Session: 0 min  Stress: No Stress Concern Present (12/18/2023)   [employer] of Occupational Health - Occupational Stress Questionnaire    Feeling of Stress : Only a little  Social Connections: Moderately Integrated (12/18/2023)   Social Connection and Isolation Panel [NHANES]    Frequency of Communication with Friends and Family: More than three times a week    Frequency of Social Gatherings with Friends and Family: More than three times a week    Attends Religious Services: Never    Database administrator or Organizations: Yes    Attends Engineer, structural: More than 4 times per year    Marital Status: Living with partner    Family History  Problem Relation Age of Onset   Hypertension Mother    Heart attack Mother    Osteoarthritis Mother    Stroke Father    Hypertension Father    Stroke Sister    Stroke Sister    Colon cancer Neg Hx    Prostate cancer Neg Hx    Cancer - Lung Neg Hx     Outpatient Encounter Medications as of 03/04/2024  Medication Sig   acetaminophen  (TYLENOL ) 650 MG CR tablet Take 1,300 mg by mouth every 8 (eight) hours as needed for pain.   allopurinol  (ZYLOPRIM ) 100 MG tablet Take 1 tablet (100 mg total) by mouth daily.   aspirin  EC 81 MG tablet Take 1 tablet (81 mg total) by mouth daily.   Blood Glucose Monitoring Suppl (ACCU-CHEK GUIDE ME) w/Device KIT 1 Piece by Does not apply route as directed.   Carboxymethylcellulose Sodium (THERATEARS PF OP) Place 1 drop into both eyes daily as needed (dry eyes).   carvedilol  (COREG ) 6.25 MG tablet TAKE 1 TABLET BY MOUTH TWICE A DAY   Cholecalciferol (D3 2000) 50 MCG (2000 UT) CAPS Take 1 capsule  by mouth daily.   cyanocobalamin  (VITAMIN B12) 1000 MCG tablet Take 1,000 mcg by mouth daily.   Evolocumab  (REPATHA  SURECLICK) 140 MG/ML SOAJ INJECT 140 MG INTO THE SKIN EVERY 14 (FOURTEEN) DAYS.   ezetimibe  (ZETIA ) 10 MG tablet TAKE 1 TABLET BY MOUTH EVERY DAY   famotidine  (PEPCID ) 20 MG tablet Take 20 mg by mouth daily.   fluconazole  (DIFLUCAN ) 200 MG tablet Take 1 tablet (200 mg total) by mouth once a week.   fluocinonide  (LIDEX ) 0.05 % external solution Apply 1  Application topically 2 (two) times daily as needed. To scalp   fluocinonide  (LIDEX ) 0.05 % external solution Apply 1 Application topically daily.   furosemide  (LASIX ) 20 MG tablet Take 1 tablet (20 mg total) by mouth as needed (FOR WEIGHT GAIN > 2-3POUNDS OVERNIGHT OR>5 POUNDS IN A WEEK).   glipiZIDE  (GLUCOTROL  XL) 5 MG 24 hr tablet TAKE 1 TABLET BY MOUTH EVERY DAY WITH BREAKFAST (Patient taking differently: 5 mg 2 (two) times daily.)   glucose blood (ACCU-CHEK GUIDE TEST) test strip Use to monitor glucose 2 times daily as instructed   hydrochlorothiazide  (HYDRODIURIL ) 25 MG tablet Take 1 tablet (25 mg total) by mouth daily.   ipratropium (ATROVENT ) 0.03 % nasal spray Place 2 sprays into both nostrils every 12 (twelve) hours.   ketoconazole  (NIZORAL ) 2 % cream APPLY TO AFFECTED AREA TWICE A DAY   levothyroxine  (SYNTHROID ) 100 MCG tablet TAKE 1 TABLET BY MOUTH EVERY DAY BEFORE BREAKFAST   metFORMIN  (GLUCOPHAGE -XR) 500 MG 24 hr tablet TAKE 1 TABLET BY MOUTH EVERY DAY WITH BREAKFAST   mupirocin  cream (BACTROBAN ) 2 % APPLY 1 APPLICATION TOPICALLY 2 (TWO) TIMES DAILY. APPLY TO GROIN   nystatin cream (MYCOSTATIN) APPLY TO THE GROIN AREA ONCE DAILY   olmesartan  (BENICAR ) 20 MG tablet Take 1 tablet (20 mg total) by mouth daily.   pantoprazole  (PROTONIX ) 40 MG tablet TAKE 1 TABLET BY MOUTH EVERY DAY   tadalafil (CIALIS) 5 MG tablet Take 5 mg by mouth daily.   TRESIBA  FLEXTOUCH 100 UNIT/ML FlexTouch Pen Inject 40 Units into the skin at  bedtime.   triamcinolone  cream (KENALOG ) 0.1 % Apply 1 Application topically 2 (two) times daily as needed. For feet   valACYclovir  (VALTREX ) 500 MG tablet TAKE 1 TABLET BY MOUTH TWICE A DAY   [DISCONTINUED] TRESIBA  FLEXTOUCH 100 UNIT/ML FlexTouch Pen Inject 35 Units into the skin at bedtime.   No facility-administered encounter medications on file as of 03/04/2024.    ALLERGIES: Allergies  Allergen Reactions   Myrbetriq [Mirabegron] Nausea And Vomiting   2,4-D Dimethylamine Other (See Comments)   Jardiance  [Empagliflozin ]     Pt does not like this medication   Niacin And Related Itching   Nsaids Other (See Comments)    Kidney damage  Non-steroidal anti-inflammatory agent (product)   Other Diarrhea and Nausea And Vomiting    PT does not want OPIOIDS   GLP-1 drugs - Nausea, vomiting, diarrhea, belching   Statins Itching    VACCINATION STATUS: Immunization History  Administered Date(s) Administered   Fluad Quad(high Dose 65+) 06/14/2022   Fluad Trivalent(High Dose 65+) 06/24/2023   Influenza Split 07/19/2013   Influenza, High Dose Seasonal PF 06/13/2015, 07/18/2016, 06/11/2017, 07/08/2018, 07/02/2019, 09/15/2020, 07/27/2021   Influenza,inj,Quad PF,6+ Mos 08/08/2014   Influenza-Unspecified 07/15/2012, 07/15/2013, 08/08/2014, 06/13/2015, 07/18/2016   PFIZER Comirnaty(Gray Top)Covid-19 Tri-Sucrose Vaccine 11/27/2019, 12/19/2019, 07/26/2020, 04/20/2021   PPD Test 01/22/2013, 01/22/2013   Pfizer Covid-19 Vaccine Bivalent Booster 32yrs & up 07/27/2021   Pneumococcal Conjugate-13 05/04/2014   Pneumococcal Polysaccharide-23 07/15/2012   Td 02/15/2003   Tdap 05/04/2014, 03/09/2022   Zoster Recombinant(Shingrix) 02/12/2017, 02/13/2017, 06/04/2017    Diabetes He presents for his follow-up diabetic visit. He has type 2 diabetes mellitus. Onset time: He was diagnosed at approximate age of 48 years. His disease course has been fluctuating. There are no hypoglycemic associated symptoms.  Pertinent negatives for hypoglycemia include no confusion, headaches, pallor or seizures. Pertinent negatives for diabetes include no chest pain, no fatigue, no polydipsia, no polyphagia, no polyuria and no  weakness. There are no hypoglycemic complications. Symptoms are worsening. Diabetic complications include heart disease. Risk factors for coronary artery disease include dyslipidemia, diabetes mellitus, family history, obesity, male sex, hypertension, tobacco exposure and sedentary lifestyle. Current diabetic treatment includes insulin  injections (He is currently on Tresiba  35 units daily, metformin  500 mg p.o. once daily, glipizide  5 mg p.o. twice daily.). His weight is fluctuating minimally. He is following a generally unhealthy diet. When asked about meal planning, he reported none. He rarely participates in exercise. His home blood glucose trend is decreasing steadily. His breakfast blood glucose range is generally 110-130 mg/dl. His overall blood glucose range is 110-130 mg/dl. Loeffelholz presents with a meter showing average glucose between 138-152 for the last 90 days monitoring only at fasting.  His point-of-care A1c is 8.3%, progressively improving from 9.3%.  He did not document hypoglycemia.     ) An ACE inhibitor/angiotensin II receptor blocker is being taken.  Hyperlipidemia This is a chronic problem. The current episode started more than 1 year ago. The problem is controlled. Exacerbating diseases include diabetes. Pertinent negatives include no chest pain, myalgias or shortness of breath. Treatments tried: He is on Repatha  injection as well as Zetia  10 mg p.o. daily. Risk factors for coronary artery disease include dyslipidemia, diabetes mellitus, family history, obesity, male sex, hypertension and a sedentary lifestyle.  Hypertension This is a chronic problem. The current episode started more than 1 year ago. Pertinent negatives include no chest pain, headaches, neck pain, palpitations or  shortness of breath. Risk factors for coronary artery disease include family history, dyslipidemia, diabetes mellitus, male gender, obesity and sedentary lifestyle. Past treatments include angiotensin blockers. Hypertensive end-organ damage includes CAD/MI.     Objective:       03/04/2024    2:57 PM 02/09/2024    3:06 PM 01/19/2024    3:32 PM  Vitals with BMI  Height 5\' 6"  5\' 6"  5' 6.75"  Weight 197 lbs 192 lbs 192 lbs  BMI 31.81 31 30.31  Systolic 118 122 782  Diastolic 62 64 68  Pulse 64 58 65    BP 118/62   Pulse 64   Ht 5\' 6"  (1.676 m)   Wt 197 lb (89.4 kg)   BMI 31.80 kg/m   Wt Readings from Last 3 Encounters:  03/04/24 197 lb (89.4 kg)  02/09/24 192 lb (87.1 kg)  01/19/24 192 lb (87.1 kg)     CMP ( most recent) CMP     Component Value Date/Time   NA 134 02/03/2024 1054   NA 139 04/29/2014 1500   K 4.3 02/03/2024 1054   K 4.2 04/29/2014 1500   CL 98 02/03/2024 1054   CL 103 04/29/2014 1500   CO2 23 02/03/2024 1054   CO2 27 04/29/2014 1500   GLUCOSE 222 (H) 02/03/2024 1054   GLUCOSE 227 (H) 06/22/2023 0603   GLUCOSE 111 (H) 04/29/2014 1500   BUN 8 02/03/2024 1054   BUN 16 04/29/2014 1500   CREATININE 1.15 02/03/2024 1054   CREATININE 1.14 04/29/2014 1500   CALCIUM  9.5 02/03/2024 1054   CALCIUM  9.2 04/29/2014 1500   PROT 6.2 (L) 06/22/2023 0603   PROT 6.9 01/22/2023 1301   ALBUMIN  3.1 (L) 06/22/2023 0603   ALBUMIN  4.0 01/22/2023 1301   AST 28 06/22/2023 0603   ALT 37 06/22/2023 0603   ALKPHOS 76 06/22/2023 0603   BILITOT 0.5 06/22/2023 0603   BILITOT 0.3 01/22/2023 1301   GFRNONAA >60 06/22/2023 0603   GFRNONAA >60 04/29/2014  1500   GFRAA >60 05/12/2020 1848   GFRAA >60 04/29/2014 1500     Diabetic Labs (most recent): Lab Results  Component Value Date   HGBA1C 8.3 (A) 03/04/2024   HGBA1C 8.5 (A) 09/29/2023   HGBA1C 9.2 (A) 05/28/2023    Lipid Panel     Component Value Date/Time   CHOL 94 02/14/2023 0425   CHOL 131 01/22/2023 1301   TRIG  89 02/14/2023 0425   HDL 29 (L) 02/14/2023 0425   HDL 38 (L) 01/22/2023 1301   CHOLHDL 3.2 02/14/2023 0425   VLDL 18 02/14/2023 0425   LDLCALC 47 02/14/2023 0425   LDLCALC 78 01/22/2023 1301   LABVLDL 15 01/22/2023 1301        Lab Results  Component Value Date   TSH 3.950 01/22/2023   TSH 2.990 10/02/2022   TSH 2.349 03/24/2022   TSH 5.760 (H) 03/12/2022   TSH 4.124 12/21/2018   TSH 5.545 (H) 09/22/2018   TSH 4.84 (H) 04/29/2014   FREET4 1.62 01/22/2023   FREET4 1.44 10/02/2022   FREET4 1.14 (H) 03/24/2022   FREET4 1.01 12/21/2018      Assessment & Plan:   1. DM type 2 causing vascular disease ([name])  - Ryan Herrera has currently uncontrolled symptomatic type 2 DM since  78 years of age.  Shea presents with a meter showing average glucose between 138-152 for the last 90 days monitoring only at fasting.  His point-of-care A1c is 8.3%, progressively improving from 9.3%.  He did not document hypoglycemia.     Recent labs reviewed.  - I had a long discussion with him about the progressive nature of diabetes and the pathology behind its complications. -his diabetes is complicated by coronary artery disease status post cardiac bypass in 2008, recurrent coronary artery disease, obesity/sedentary life, comorbid hypertension /hyperlipidemia, history of smoking and he remains at a high risk for more acute and chronic complications which include CAD, CVA, CKD, retinopathy, and neuropathy. These are all discussed in detail with him.  - I discussed all available options of managing his diabetes including de-escalation of medications. I have counseled him on diet  and weight management  by adopting a Whole Food , Plant Predominant  ( WFPP) nutrition as recommended by [employer] of Lifestyle Medicine. Patient is encouraged to switch to  unprocessed or minimally processed  complex starch, adequate protein intake (mainly plant source), minimal liquid fat ( mainly vegetable  oils), plenty of fruits, and vegetables.  -  he is advised to stick to a routine mealtimes to eat 3 complete meals a day and snack only when necessary ( to snack only to correct hypoglycemia BG <70 day time or <100 at night).   - He did not engage optimally with lifestyle medicine nutrition, however, he acknowledges that there is a room for improvement in his food and drink choices. - Suggestion is made for him to avoid simple carbohydrates  from his diet including Cakes, Sweet Desserts, Ice Cream, Soda (diet and regular), Sweet Tea, Candies, Chips, Cookies, Store Bought Juices, Alcohol , Artificial Sweeteners,  Coffee Creamer, and "Sugar-free" Products, Lemonade. This will help patient to have more stable blood glucose profile and potentially avoid unintended weight gain.  The following Lifestyle Medicine recommendations according to American College of Lifestyle Medicine  [facility]) were discussed and and offered to patient and he  agrees to start the journey:  A. Whole Foods, Plant-Based Nutrition comprising of fruits and vegetables, plant-based proteins, whole-grain carbohydrates was  discussed in detail with the patient.   A list for source of those nutrients were also provided to the patient.  Patient will use only water or unsweetened tea for hydration. B.  The need to stay away from risky substances including alcohol, smoking; obtaining 7 to 9 hours of restorative sleep, at least 150 minutes of moderate intensity exercise weekly, the importance of healthy social connections,  and stress management techniques were discussed. C.  A full color page of  Calorie density of various food groups per pound showing examples of each food groups was provided to the patient.   - he will be scheduled with Penny Crumpton, RDN, CDE for individualized diabetes education.  - I have approached him with the following plan to manage  his diabetes and patient agrees:   -Based on his presentation with above target  glycemic profile evident from his high A1c of 8.3%, he was approached for premixed insulin  twice a day, however he reports that he would not be able to conduct 2 injections daily.  He also does not have resources to monitor blood glucose frequently.  He does not want to wear a CGM.    He is approached and willing to increase his Tresiba  slightly to 40 units nightly, associated with monitoring of blood glucose at least 2 times a day-daily before breakfast and at bedtime.   -He is advised to continue metformin  500 mg p.o. once a day, continue glipizide  5 mg p.o. twice daily with breakfast and supper.   He did have intolerance to SGLT2 inhibitors and wishes to avoid this medications. - he is encouraged to monitor glucose at least 2 times daily- before breakfast and at bed time, call clinic for blood glucose levels less than 70 or above 200 mg /dl fasting [id number].   - Specific targets for  A1c;  LDL, HDL,  and Triglycerides were discussed with the patient.  2) Blood Pressure /Hypertension:   His blood pressure is controlled to target.  he is advised to continue his current medications including losartan  25 mg p.o. daily with breakfast .   3) Lipids/Hyperlipidemia:   Review of his recent lipid panel showed controlled LDL at 47.  He reports statin intolerance.    He is advised to continue Repatha  140 mg every 14 days, Zetia  10 mg every night.   Side effects and precautions discussed with him.   4)  Weight/Diet:  Body mass index is 31.8 kg/m.  -   clearly complicating his diabetes care.   he is  a candidate for weight loss. I discussed with him the fact that loss of 5 - 10% of his  current body weight will have the most impact on his diabetes management.  The above detailed  ACLM recommendations for nutrition, exercise, sleep, social life, avoidance of risky substances, the need for restorative sleep   information will also detailed on discharge instructions.  5) Chronic Care/Health Maintenance:  -he   is on ARB  medications and  is encouraged to initiate and continue to follow up with Ophthalmology, Dentist,  Podiatrist at least yearly or according to recommendations, and advised to   stay away from smoking. I have recommended yearly flu vaccine and pneumonia vaccine at least every 5 years; moderate intensity exercise for up to 150 minutes weekly; and  sleep for 7- 9 hours a day.   6) hypothyroidism: Circumstance of diagnosis not available to review.  His recent thyroid  function tests are consistent with appropriate replacement he is advised  to continue levothyroxine   100 mcg p.o. daily before breakfast.   - We discussed about the correct intake of his thyroid  hormone, on empty stomach at fasting, with water, separated by at least 30 minutes from breakfast and other medications,  and separated by more than 4 hours from calcium , iron, multivitamins, acid reflux medications (PPIs). -Patient is made aware of the fact that thyroid  hormone replacement is needed for life, dose to be adjusted by periodic monitoring of thyroid  function tests.   - he is  advised to maintain close follow up with Kermit Ped, Heath Litten, MD for primary care needs, as well as his other providers for optimal and coordinated care.  I spent  26  minutes in the care of the patient today including review of labs from CMP, Lipids, Thyroid  Function, Hematology (current and previous including abstractions from other facilities); face-to-face time discussing  his blood glucose readings/logs, discussing hypoglycemia and hyperglycemia episodes and symptoms, medications doses, his options of short and long term treatment based on the latest standards of care / guidelines;  discussion about incorporating lifestyle medicine;  and documenting the encounter. Risk reduction counseling performed per USPSTF guidelines to reduce  obesity and cardiovascular risk factors.     Please refer to Patient Instructions for Blood Glucose Monitoring and  Insulin /Medications Dosing Guide"  in media tab for additional information. Please  also refer to " Patient Self Inventory" in the Media  tab for reviewed elements of pertinent patient history.  Ryan Herrera participated in the discussions, expressed understanding, and voiced agreement with the above plans.  All questions were answered to his satisfaction. he is encouraged to contact clinic should he have any questions or concerns prior to his return visit.    Follow up plan: - Return in about 4 months (around 07/05/2024) for Bring Meter/CGM Device/Logs- A1c in Office.  Kalvin Orf, MD [facility] [address] [place], [place] [address] Phone: [phone]  Fax: [phone]    03/04/2024, 6:21 PM  This note was partially dictated with voice recognition software. Similar sounding words can be transcribed inadequately or may not  be corrected upon review.

## 2024-03-04 NOTE — Patient Instructions (Signed)

## 2024-03-10 ENCOUNTER — Encounter: Payer: Self-pay | Admitting: Podiatry

## 2024-03-10 ENCOUNTER — Ambulatory Visit (INDEPENDENT_AMBULATORY_CARE_PROVIDER_SITE_OTHER): Admitting: Podiatry

## 2024-03-10 DIAGNOSIS — M79675 Pain in left toe(s): Secondary | ICD-10-CM | POA: Diagnosis not present

## 2024-03-10 DIAGNOSIS — E119 Type 2 diabetes mellitus without complications: Secondary | ICD-10-CM | POA: Diagnosis not present

## 2024-03-10 DIAGNOSIS — M79674 Pain in right toe(s): Secondary | ICD-10-CM | POA: Diagnosis not present

## 2024-03-10 DIAGNOSIS — B351 Tinea unguium: Secondary | ICD-10-CM

## 2024-03-10 NOTE — Progress Notes (Signed)
This patient returns to my office for at risk foot care.  This patient requires this care by a professional since this patient will be at risk due to having diabetes.  This patient is unable to cut nails himself since the patient cannot reach his nails.These nails are painful walking and wearing shoes.  This patient presents for at risk foot care today.  General Appearance  Alert, conversant and in no acute stress.  Vascular  Dorsalis pedis and posterior tibial  pulses are palpable  bilaterally.  Capillary return is within normal limits  bilaterally. Temperature is within normal limits  bilaterally.  Neurologic  Senn-Weinstein monofilament wire test within normal limits  bilaterally. Muscle power within normal limits bilaterally.  Nails Thick disfigured discolored nails with subungual debris  from hallux to fifth toes bilaterally. No evidence of bacterial infection or drainage bilaterally.  Orthopedic  No limitations of motion  feet .  No crepitus or effusions noted.  No bony pathology or digital deformities noted.  Skin  normotropic skin with no porokeratosis noted bilaterally.  No signs of infections or ulcers noted.     Onychomycosis  Pain in right toes  Pain in left toes  Consent was obtained for treatment procedures.   Mechanical debridement of nails 1-5  bilaterally performed with a nail nipper.  Filed with dremel without incident.    Return office visit  10 weeks                    Told patient to return for periodic foot care and evaluation due to potential at risk complications.   Reesa Gotschall DPM   

## 2024-03-11 DIAGNOSIS — K529 Noninfective gastroenteritis and colitis, unspecified: Secondary | ICD-10-CM | POA: Diagnosis not present

## 2024-03-15 DIAGNOSIS — K529 Noninfective gastroenteritis and colitis, unspecified: Secondary | ICD-10-CM | POA: Diagnosis not present

## 2024-03-22 ENCOUNTER — Encounter: Payer: Self-pay | Admitting: Internal Medicine

## 2024-03-22 ENCOUNTER — Ambulatory Visit (INDEPENDENT_AMBULATORY_CARE_PROVIDER_SITE_OTHER): Admitting: Internal Medicine

## 2024-03-22 VITALS — BP 120/68 | HR 66 | Ht 66.0 in | Wt 197.4 lb

## 2024-03-22 DIAGNOSIS — E66811 Obesity, class 1: Secondary | ICD-10-CM

## 2024-03-22 DIAGNOSIS — E785 Hyperlipidemia, unspecified: Secondary | ICD-10-CM | POA: Diagnosis not present

## 2024-03-22 DIAGNOSIS — I10 Essential (primary) hypertension: Secondary | ICD-10-CM | POA: Diagnosis not present

## 2024-03-22 DIAGNOSIS — K760 Fatty (change of) liver, not elsewhere classified: Secondary | ICD-10-CM

## 2024-03-22 DIAGNOSIS — E119 Type 2 diabetes mellitus without complications: Secondary | ICD-10-CM

## 2024-03-22 DIAGNOSIS — B356 Tinea cruris: Secondary | ICD-10-CM

## 2024-03-22 DIAGNOSIS — L219 Seborrheic dermatitis, unspecified: Secondary | ICD-10-CM | POA: Diagnosis not present

## 2024-03-22 DIAGNOSIS — E039 Hypothyroidism, unspecified: Secondary | ICD-10-CM | POA: Diagnosis not present

## 2024-03-22 MED ORDER — KETOCONAZOLE 2 % EX SHAM: 1.0000 | MEDICATED_SHAMPOO | CUTANEOUS | 3 refills | Status: AC

## 2024-03-22 MED ORDER — NYSTATIN 100000 UNIT/GM EX CREA: TOPICAL_CREAM | CUTANEOUS | 3 refills | Status: DC

## 2024-03-22 NOTE — Progress Notes (Unsigned)
 Established Patient Office Visit  Subjective   Patient ID: Ryan Herrera, male    DOB: [birth year]  Age: 78 y.o. MRN: [id number]  Chief Complaint  Patient presents with   Care Management    Three month follow up    Mr. Ryan Herrera returns to care today for routine follow-up.  He was last evaluated by me 3/17.  A referral was placed to [facility] at his request.  I also recommended increasing the frequency of pantoprazole  to twice daily for improved management of GERD.  56-month follow-up was arranged for reassessment.  In the interim he has been seen by podiatry, urology, cardiology, and endocrinology for follow-up.  He established care with gastroenterology at [facility] on 6/5.  Past Medical History:  Diagnosis Date   Arthritis    hands   Coronary artery disease    a. s/p CABG in 2008 at [facility] b. re-do CABG at [facility] in 03/2022 with left RA-RI and SVG-PDA   Diabetes mellitus without complication ([name])    GERD (gastroesophageal reflux disease)    H/O poliomyelitis    age 39.  now has weak stomach muscles   Hypertension    IBS (irritable bowel syndrome)    Iron deficiency anemia 09/22/2018   Myocardial infarct [facility])    Renal insufficiency    Thyroid  disease    Past Surgical History:  Procedure Laterality Date   BACK SURGERY  1967   BACK SURGERY  10/04/2005   benign tumor removal  2004   BIOPSY  11/18/2022   Procedure: BIOPSY;  Surgeon: Vinetta Greening, DO;  [facility]: [facility];  Service: Endoscopy;;   COLONOSCOPY WITH PROPOFOL  N/A 11/18/2022   Procedure: COLONOSCOPY WITH PROPOFOL ;  Surgeon: Vinetta Greening, DO;  Location: [facility];  Service: Endoscopy;  Laterality: N/A;  8:30 am, pt did not want early appt. moved down (per PAT)   CORONARY ARTERY BYPASS GRAFT  2008   [facility] - 3 vessel   CORONARY ARTERY BYPASS GRAFT N/A 03/28/2022   Procedure: REDO CORONARY ARTERY BYPASS GRAFTING (CABG) X2 BYPASSES USING OPEN LEFT RADIAL ARTERY AND ENDOSCOPIC RIGHT  GREATER SAPHENOUS VEIN HARVEST;  Surgeon: Zelphia Higashi, MD;  Location: [facility] OR;  Service: Open Heart Surgery;  Laterality: N/A;   CYSTOSCOPY WITH INSERTION OF UROLIFT     ESOPHAGOGASTRODUODENOSCOPY (EGD) WITH PROPOFOL  N/A 11/18/2022   Procedure: ESOPHAGOGASTRODUODENOSCOPY (EGD) WITH PROPOFOL ;  Surgeon: Vinetta Greening, DO;  [facility];  Service: Endoscopy;  Laterality: N/A;   HERNIA REPAIR Bilateral    inguinal   HYDROCELE EXCISION / REPAIR     JOINT REPLACEMENT     left knee x2, rright x1   LEFT HEART CATH AND CORS/GRAFTS ANGIOGRAPHY N/A 03/25/2022   Procedure: LEFT HEART CATH AND CORS/GRAFTS ANGIOGRAPHY;  Surgeon: Lucendia Rusk, MD;  Location: [facility] INVASIVE CV LAB;  Service: Cardiovascular;  Laterality: N/A;   POLYPECTOMY  11/18/2022   Procedure: POLYPECTOMY;  Surgeon: Vinetta Greening, DO;  Location[facility];  Service: Endoscopy;;   PROSTATE SURGERY N/A 01/2021   TEE WITHOUT CARDIOVERSION N/A 03/28/2022   Procedure: TRANSESOPHAGEAL ECHOCARDIOGRAM (TEE);  Surgeon: Zelphia Higashi, MD;  Location: [facility] OR;  Service: Open Heart Surgery;  Laterality: N/A;   TONSILLECTOMY     Social History   Tobacco Use   Smoking status: Former    Current packs/day: 0.00    Average packs/day: 1 pack/day for 38.0 years (38.0 ttl pk-yrs)    Types: Cigarettes  Start date: 12/1958    Quit date: 12/1996    Years since quitting: 27.3   Smokeless tobacco: Never  Vaping Use   Vaping status: Never Used  Substance Use Topics   Alcohol use: Yes    Comment: occassionally   Drug use: No   Family History  Problem Relation Age of Onset   Hypertension Mother    Heart attack Mother    Osteoarthritis Mother    Stroke Father    Hypertension Father    Stroke Sister    Stroke Sister    Colon cancer Neg Hx    Prostate cancer Neg Hx    Cancer - Lung Neg Hx    Allergies  Allergen Reactions   Myrbetriq [Mirabegron] Nausea And Vomiting   2,4-D Dimethylamine Other (See  Comments)   Jardiance  [Empagliflozin ]     Pt does not like this medication   Niacin And Related Itching   Nsaids Other (See Comments)    Kidney damage  Non-steroidal anti-inflammatory agent (product)   Other Diarrhea and Nausea And Vomiting    PT does not want OPIOIDS   GLP-1 drugs - Nausea, vomiting, diarrhea, belching   Statins Itching   ROS    Objective:     BP 120/68   Pulse 66   Ht 5' 6 (1.676 m)   Wt 197 lb 6.4 oz (89.5 kg)   SpO2 96%   BMI 31.86 kg/m  BP Readings from Last 3 Encounters:  03/22/24 120/68  03/04/24 118/62  02/09/24 122/64   Physical Exam  Last CBC Lab Results  Component Value Date   WBC 5.6 11/04/2023   HGB 11.9 (L) 11/04/2023   HCT 36.8 (L) 11/04/2023   MCV 87 11/04/2023   MCH 28.2 11/04/2023   RDW 13.4 11/04/2023   PLT 178 11/04/2023   Last metabolic panel Lab Results  Component Value Date   GLUCOSE 222 (H) 02/03/2024   NA 134 02/03/2024   K 4.3 02/03/2024   CL 98 02/03/2024   CO2 23 02/03/2024   BUN 8 02/03/2024   CREATININE 1.15 02/03/2024   EGFR 65 02/03/2024   CALCIUM  9.5 02/03/2024   PHOS 3.8 06/22/2023   PROT 6.2 (L) 06/22/2023   ALBUMIN  3.1 (L) 06/22/2023   LABGLOB 2.9 01/22/2023   AGRATIO 1.4 01/22/2023   BILITOT 0.5 06/22/2023   ALKPHOS 76 06/22/2023   AST 28 06/22/2023   ALT 37 06/22/2023   ANIONGAP 9 06/22/2023   Last lipids Lab Results  Component Value Date   CHOL 94 02/14/2023   HDL 29 (L) 02/14/2023   LDLCALC 47 02/14/2023   TRIG 89 02/14/2023   CHOLHDL 3.2 02/14/2023   Last hemoglobin A1c Lab Results  Component Value Date   HGBA1C 8.3 (A) 03/04/2024   Last thyroid  functions Lab Results  Component Value Date   TSH 3.950 01/22/2023   T3TOTAL 74 03/24/2022   Last vitamin D  Lab Results  Component Value Date   VD25OH 40.5 01/22/2023   Last vitamin B12 and Folate Lab Results  Component Value Date   VITAMINB12 573 09/22/2018   FOLATE 16.6 09/22/2018     Assessment & Plan:   Problem  List Items Addressed This Visit   None Visit Diagnoses       Tinea cruris    -  Primary   Relevant Medications   ketoconazole  (NIZORAL ) 2 % shampoo   nystatin cream (MYCOSTATIN)     Seborrheic dermatitis       Relevant Medications  ketoconazole  (NIZORAL ) 2 % shampoo       No follow-ups on file.    Tobi Fortes, MD

## 2024-03-22 NOTE — Patient Instructions (Signed)
 It was a pleasure to see you today.  Thank you for giving [place] the opportunity to be involved in your care.  Below is a brief recap of your visit and next steps.  We will plan to see you again in 6 months.  Summary No medication changes today Refills provided Repeat labs ordered Follow up in 6 months

## 2024-03-23 ENCOUNTER — Ambulatory Visit: Payer: Self-pay | Admitting: Internal Medicine

## 2024-03-23 DIAGNOSIS — L219 Seborrheic dermatitis, unspecified: Secondary | ICD-10-CM | POA: Insufficient documentation

## 2024-03-23 DIAGNOSIS — E039 Hypothyroidism, unspecified: Secondary | ICD-10-CM

## 2024-03-23 LAB — LIPID PANEL
Chol/HDL Ratio: 2.3 ratio (ref 0.0–5.0)
Cholesterol, Total: 99 mg/dL — ABNORMAL LOW (ref 100–199)
HDL: 44 mg/dL (ref >39)
LDL Chol Calc (NIH): 41 mg/dL (ref 0–99)
Triglycerides: 61 mg/dL (ref 0–149)
VLDL Cholesterol Cal: 14 mg/dL (ref 5–40)

## 2024-03-23 LAB — B12 AND FOLATE PANEL
Folate: 10.8 ng/mL (ref >3.0)
Vitamin B-12: 1152 pg/mL (ref 232–1245)

## 2024-03-23 LAB — TSH+FREE T4
Free T4: 1.17 ng/dL (ref 0.82–1.77)
TSH: 6.48 u[IU]/mL — ABNORMAL HIGH (ref 0.450–4.500)

## 2024-03-23 LAB — VITAMIN D 25 HYDROXY (VIT D DEFICIENCY, FRACTURES): Vit D, 25-Hydroxy: 49.1 ng/mL (ref 30.0–100.0)

## 2024-03-23 MED ORDER — LEVOTHYROXINE SODIUM 112 MCG PO TABS: 112.0000 ug | ORAL_TABLET | Freq: Every day | ORAL | 3 refills | Status: AC

## 2024-03-23 NOTE — Assessment & Plan Note (Signed)
 Closely followed by endocrinology.  A1c 8.3 on labs from last month.  Endocrinology follow-up is scheduled for late September.

## 2024-03-23 NOTE — Assessment & Plan Note (Signed)
 Remains adequately controlled on current antihypertensive regimen.

## 2024-03-23 NOTE — Assessment & Plan Note (Signed)
 He has established care with gastroenterology at [facility] in [place].  He is pleased with his care thus far and will follow-up next month for reassessment.

## 2024-03-23 NOTE — Assessment & Plan Note (Signed)
 Currently prescribed levothyroxine  100 mcg daily.  Repeat thyroid  studies ordered today.

## 2024-03-23 NOTE — Assessment & Plan Note (Signed)
Ketoconazole shampoo refilled today

## 2024-03-23 NOTE — Assessment & Plan Note (Signed)
Nystatin cream refilled today.

## 2024-04-07 ENCOUNTER — Telehealth: Payer: Self-pay

## 2024-04-07 NOTE — Telephone Encounter (Signed)
 Copied from CRM [id number]. Topic: Clinical - Lab/Test Results >> Apr 06, 2024  3:52 PM Zebedee SAUNDERS wrote: Reason for CRM: Received call from [facility] Calls per Lauraine ph: [phone] regarding abnormal labs. Please call to discuss.

## 2024-04-07 NOTE — Telephone Encounter (Signed)
 [employer] should be faxing over letter

## 2024-04-24 ENCOUNTER — Other Ambulatory Visit: Payer: Self-pay | Admitting: "Endocrinology

## 2024-04-24 DIAGNOSIS — E1159 Type 2 diabetes mellitus with other circulatory complications: Secondary | ICD-10-CM

## 2024-04-26 DIAGNOSIS — K760 Fatty (change of) liver, not elsewhere classified: Secondary | ICD-10-CM | POA: Diagnosis not present

## 2024-04-26 DIAGNOSIS — K8681 Exocrine pancreatic insufficiency: Secondary | ICD-10-CM | POA: Diagnosis not present

## 2024-04-26 DIAGNOSIS — K219 Gastro-esophageal reflux disease without esophagitis: Secondary | ICD-10-CM | POA: Diagnosis not present

## 2024-04-26 DIAGNOSIS — K529 Noninfective gastroenteritis and colitis, unspecified: Secondary | ICD-10-CM | POA: Diagnosis not present

## 2024-05-16 NOTE — Progress Notes (Unsigned)
 Cardiology Office Note:  .   Date:  05/17/2024  [name], DOB 04/30/1946, MRN [id number] PCP: Bevely Doffing, FNP  [facility] Providers Cardiologist:  Gordy Bergamo, MD   History of Present Illness: .   Ryan Herrera is a 78 y.o. male with a hx of CAD s/p CABG x 3 in 2008 with re-do CABG at [facility] 03/28/2022 with left RA-RI and SVG-PDA, and had patent LIMA to LAD from 2008.  PMH significant for DM with stage IIIa CKD, GERD, HTN, HLD and remote cigarette smoking quit in 1998,  previously being followed by Dr. Ronal Herrera now transitioning his care to me.  He has had a low risk nuclear stress test on 11/18/2023 and echocardiogram essentially normal on 10/31/2023.  Cardiac Studies relevent.    CARDIAC CATHETERIZATION 03/25/2022   MYOCARDIAL PERFUSION IMAGING 11/18/2023 No evidence of ischemia, normal LVEF at 59%, low risk. Discussed the use of AI scribe software for clinical note transcription with the patient, who gave verbal consent to proceed.  ECHOCARDIOGRAM COMPLETE 10/31/2023  Normal LVEF at 60 to 65% with grade 1 diastolic dysfunction.  Mild aortic valve sclerosis.  CXR 11/22/2023: Stable heart size post median sternotomy. Aortic atherosclerosis. Mild lingular scarring. Mild chronic bronchial thickening. No acute airspace disease. Normal pulmonary vasculature. No pleural fluid or pneumothorax thoracic spondylosis History of Present Illness Ryan Herrera is an 78 year old male with coronary artery disease and diabetes who presents with shortness of breath.  He underwent a three-vessel bypass surgery in 2008 and a redo bypass surgery in 2023 due to vein grafts being occluded to Cx and RCA. He experiences shortness of breath, particularly when walking uphill or carrying items, which has remained stable over the past six months. There is no chest pain, tightness, or heaviness.  He manages diabetes with Tresiba , glipizide , and metformin , but it remains  uncontrolled. He cannot tolerate Ozempic or Wegovy due to severe nausea and vomiting.  He quit smoking in 1998. He experiences leg swelling, especially after prolonged sitting, and uses a device to assist with compression socks due to limited mobility.  He takes carvedilol , chlorothiazide, and omisartan for high blood pressure, and Repatha  and Zetia  for high cholesterol. He rarely uses Lasix .  Labs   Lab Results  Component Value Date   CHOL 99 (L) 03/22/2024   HDL 44 03/22/2024   LDLCALC 41 03/22/2024   TRIG 61 03/22/2024   CHOLHDL 2.3 03/22/2024   Lipoprotein (a)  Date/Time Value Ref Range Status  03/23/2022 01:10 AM 119.4 (H) <75.0 nmol/L Final    Comment:    (NOTE) Note:  Values greater than or equal to 75.0 nmol/L may       indicate an independent risk factor for CHD,       but must be evaluated with caution when applied       to non-Caucasian populations due to the       influence of genetic factors on Lp(a) across       ethnicities. Performed At: [facility] [address] [place], [place] [id number] Jennette Shorter MD Ey:[id number]     Recent Labs    06/21/23 1655 06/22/23 0603 11/04/23 1531 02/03/24 1054  NA 133* 132* 136 134  K 3.7 3.6 4.0 4.3  CL 101 100 97 98  CO2 24 23 25 23   GLUCOSE 123* 227* 221* 222*  BUN 16 15 17 8   CREATININE 1.24 1.04 1.27 1.15  CALCIUM  8.8* 9.0 9.5 9.5  GFRNONAA 59* >60  --   --     Lab Results  Component Value Date   ALT 37 06/22/2023   AST 28 06/22/2023   ALKPHOS 76 06/22/2023   BILITOT 0.5 06/22/2023      Latest Ref Rng & Units 11/04/2023    3:31 PM 06/22/2023    6:03 AM 06/21/2023    4:55 PM  CBC  WBC 3.4 - 10.8 x10E3/uL 5.6  4.6  5.0   Hemoglobin 13.0 - 17.7 g/dL 88.0  88.6  88.4   Hematocrit 37.5 - 51.0 % 36.8  35.3  34.8   Platelets 150 - 450 x10E3/uL 178  138  155    Lab Results  Component Value Date   HGBA1C 8.3 (A) 03/04/2024    Lab Results  Component Value Date   TSH 6.480 (H) 03/22/2024      ROS  Review of Systems  Cardiovascular:  Positive for dyspnea on exertion (chronic and stable). Negative for chest pain and leg swelling.   Physical Exam:   VS:  BP 136/68 (BP Location: Left Arm, Patient Position: Sitting, Cuff Size: Normal)   Pulse 62   Resp 16   Ht 5' 6 (1.676 m)   Wt 199 lb 3.2 oz (90.4 kg)   SpO2 96%   BMI 32.15 kg/m    Wt Readings from Last 3 Encounters:  05/17/24 199 lb 3.2 oz (90.4 kg)  03/22/24 197 lb 6.4 oz (89.5 kg)  03/04/24 197 lb (89.4 kg)    BP Readings from Last 3 Encounters:  05/17/24 136/68  03/22/24 120/68  03/04/24 118/62   Physical Exam Constitutional:      Appearance: He is obese.  Neck:     Vascular: No carotid bruit or JVD.  Cardiovascular:     Rate and Rhythm: Normal rate and regular rhythm.     Pulses: Intact distal pulses.     Heart sounds: Normal heart sounds. No murmur heard.    No gallop.  Pulmonary:     Effort: Pulmonary effort is normal.     Breath sounds: Normal breath sounds.  Abdominal:     General: Bowel sounds are normal.     Palpations: Abdomen is soft.  Musculoskeletal:     Right lower leg: Edema (trace ankle edema) present.     Left lower leg: Edema (trace ankle edema) present.    EKG:       EKG personally reviewed 01/19/2024: Sinus rhythm with first-degree AV block at the rate of 61 bpm, PR interval 318 ms.  Left atrial enlargement.  LVH.  Nonspecific T abnormality.  Compared to 09/22/2023, nonspecific lateral T wave changes new.  ASSESSMENT AND PLAN: .      ICD-10-CM   1. Coronary artery disease involving native coronary artery of native heart without angina pectoris  I25.10     2. S/P CABG x 3: LIMA to LAD (2008) and Redo CABG (03/28/22) free left RA-RI and SVG-PDA  Z95.1     3. Primary hypertension  I10     4. Mixed hyperlipidemia  E78.2      Assessment & Plan Coronary artery disease status post Redo-CABG with stable symptoms CABG x 3 in 2008 and a redo bypass surgery in 2023 due to vein  grafts being occluded to Cx and RCA. No chest pain or new cardiac symptoms. Shortness of breath is stable. EKG remains unchanged. - Continue current cardiac medications without changes - No additional cardiac testing needed - Follow up in one year  unless symptoms change  Type 2 diabetes mellitus, poorly controlled Diabetes remains poorly controlled despite Tresiba , glipizide , and metformin . Previous semaglutide trials caused severe nausea and vomiting. Jardiance  was discontinued due to frequent urination prior to his prostate surgery. Discussed Farxiga's benefits, including renal and cardiac protection, weight loss, and improved glycemic control. He is open to trying [place]. - Send message to primary care provider to prescribe Doreen - Consider providing samples of Farxiga for trial - Monitor for increased urination initially with Farxiga  Hypertension, well controlled Hypertension is well controlled with carvedilol , chlorothiazide, and omisartan.  Hyperlipidemia, well controlled Hyperlipidemia is well controlled with Repatha  and Zetia . Cholesterol levels are excellent.  Chronic lower extremity edema post vein harvest Chronic lower extremity edema persists post vein harvest. Compression socks are suggested but difficult to use due to physical limitations.  Follow up: 1 Year for CAD  Signed,  Gordy Bergamo, MD, [facility] 05/17/2024, 6:23 PM [facility] [address] [place], [place] [address] Phone: [phone]. Fax:  [phone]

## 2024-05-17 ENCOUNTER — Encounter: Payer: Self-pay | Admitting: Cardiology

## 2024-05-17 ENCOUNTER — Ambulatory Visit: Attending: Cardiology | Admitting: Cardiology

## 2024-05-17 VITALS — BP 136/68 | HR 62 | Resp 16 | Ht 66.0 in | Wt 199.2 lb

## 2024-05-17 DIAGNOSIS — Z951 Presence of aortocoronary bypass graft: Secondary | ICD-10-CM | POA: Diagnosis not present

## 2024-05-17 DIAGNOSIS — I2510 Atherosclerotic heart disease of native coronary artery without angina pectoris: Secondary | ICD-10-CM

## 2024-05-17 DIAGNOSIS — I10 Essential (primary) hypertension: Secondary | ICD-10-CM | POA: Diagnosis not present

## 2024-05-17 DIAGNOSIS — E782 Mixed hyperlipidemia: Secondary | ICD-10-CM

## 2024-05-17 NOTE — Patient Instructions (Signed)
 Medication Instructions:  Your physician recommends that you continue on your current medications as directed. Please refer to the Current Medication list given to you today.  *If you need a refill on your cardiac medications before your next appointment, please call your pharmacy*  Lab Work: none If you have labs (blood work) drawn today and your tests are completely normal, you will receive your results only by: MyChart Message (if you have MyChart) OR A paper copy in the mail If you have any lab test that is abnormal or we need to change your treatment, we will call you to review the results.  Testing/Procedures: none  Follow-Up: At [facility], you and your health needs are our priority.  As part of our continuing mission to provide you with exceptional heart care, our providers are all part of one team.  This team includes your primary Cardiologist (physician) and Advanced Practice Providers or APPs (Physician Assistants and Nurse Practitioners) who all work together to provide you with the care you need, when you need it.  Your next appointment:   12 month(s)  Provider:   Dr Berry Bristol    We recommend signing up for the patient portal called "MyChart".  Sign up information is provided on this After Visit Summary.  MyChart is used to connect with patients for Virtual Visits (Telemedicine).  Patients are able to view lab/test results, encounter notes, upcoming appointments, etc.  Non-urgent messages can be sent to your provider as well.   To learn more about what you can do with MyChart, go to [email or web address].   Other Instructions

## 2024-05-18 DIAGNOSIS — E1122 Type 2 diabetes mellitus with diabetic chronic kidney disease: Secondary | ICD-10-CM | POA: Diagnosis not present

## 2024-05-18 DIAGNOSIS — N182 Chronic kidney disease, stage 2 (mild): Secondary | ICD-10-CM | POA: Diagnosis not present

## 2024-05-18 DIAGNOSIS — I10 Essential (primary) hypertension: Secondary | ICD-10-CM | POA: Diagnosis not present

## 2024-05-18 DIAGNOSIS — D631 Anemia in chronic kidney disease: Secondary | ICD-10-CM | POA: Diagnosis not present

## 2024-05-22 ENCOUNTER — Other Ambulatory Visit: Payer: Self-pay | Admitting: Internal Medicine

## 2024-05-22 DIAGNOSIS — E039 Hypothyroidism, unspecified: Secondary | ICD-10-CM

## 2024-05-24 ENCOUNTER — Other Ambulatory Visit: Payer: Self-pay | Admitting: Physician Assistant

## 2024-05-24 MED ORDER — EZETIMIBE 10 MG PO TABS: 10.0000 mg | ORAL_TABLET | Freq: Every day | ORAL | 3 refills | Status: AC

## 2024-05-26 ENCOUNTER — Other Ambulatory Visit: Payer: Self-pay | Admitting: Physician Assistant

## 2024-05-27 ENCOUNTER — Telehealth: Payer: Self-pay | Admitting: Cardiology

## 2024-05-27 ENCOUNTER — Other Ambulatory Visit: Payer: Self-pay

## 2024-05-27 MED ORDER — HYDROCHLOROTHIAZIDE 25 MG PO TABS: 25.0000 mg | ORAL_TABLET | Freq: Every day | ORAL | 3 refills | Status: AC

## 2024-05-27 NOTE — Telephone Encounter (Signed)
*  STAT* If patient is at the pharmacy, call can be transferred to refill team.   1. Which medications need to be refilled? (please list name of each medication and dose if known)   hydrochlorothiazide  (HYDRODIURIL ) 25 MG tablet   2. Would you like to learn more about the convenience, safety, & potential cost savings by using the [facility]?   3. Are you open to using the [facility] (Type [facility]. ).  4. Which pharmacy/location (including street and city if local pharmacy) is medication to be sent to?  [id number] - [place], [place] - [address] AT [address]   5. Do they need a 30 day or 90 day supply?   90 day  Patient stated he only has a few tablets left.

## 2024-05-31 ENCOUNTER — Other Ambulatory Visit: Payer: Self-pay | Admitting: "Endocrinology

## 2024-06-10 ENCOUNTER — Ambulatory Visit: Admitting: Podiatry

## 2024-06-12 ENCOUNTER — Other Ambulatory Visit: Payer: Self-pay | Admitting: "Endocrinology

## 2024-06-12 DIAGNOSIS — E1159 Type 2 diabetes mellitus with other circulatory complications: Secondary | ICD-10-CM

## 2024-06-16 ENCOUNTER — Other Ambulatory Visit: Payer: Self-pay | Admitting: Internal Medicine

## 2024-06-16 DIAGNOSIS — K219 Gastro-esophageal reflux disease without esophagitis: Secondary | ICD-10-CM

## 2024-06-17 ENCOUNTER — Encounter: Payer: Self-pay | Admitting: Podiatry

## 2024-06-17 ENCOUNTER — Ambulatory Visit (INDEPENDENT_AMBULATORY_CARE_PROVIDER_SITE_OTHER)

## 2024-06-17 ENCOUNTER — Ambulatory Visit (INDEPENDENT_AMBULATORY_CARE_PROVIDER_SITE_OTHER): Admitting: Podiatry

## 2024-06-17 ENCOUNTER — Ambulatory Visit

## 2024-06-17 VITALS — Ht 66.0 in | Wt 199.2 lb

## 2024-06-17 DIAGNOSIS — M19071 Primary osteoarthritis, right ankle and foot: Secondary | ICD-10-CM | POA: Diagnosis not present

## 2024-06-17 DIAGNOSIS — M19072 Primary osteoarthritis, left ankle and foot: Secondary | ICD-10-CM

## 2024-06-17 DIAGNOSIS — M7751 Other enthesopathy of right foot: Secondary | ICD-10-CM | POA: Diagnosis not present

## 2024-06-17 MED ORDER — DICLOFENAC SODIUM 1 % EX GEL: 4.0000 g | Freq: Four times a day (QID) | CUTANEOUS | 4 refills | Status: DC

## 2024-06-17 NOTE — Progress Notes (Signed)
 "  Subjective:  Patient ID: Ryan Herrera, male    DOB: November 12, 1945,  MRN: [id number]  Chief Complaint  Patient presents with   Nail Problem    [facility]    Discussed the use of AI scribe software for clinical note transcription with the patient, who gave verbal consent to proceed.  History of Present Illness Ryan Herrera is an 78 year old male with diabetes who presents with bilateral foot pain.  He experiences sharp pain in both feet, primarily in the right foot, which worsens with walking, especially in stores or around the house. The pain is located in the middle and the balls of both feet, and sometimes it feels as if the bones are breaking when he takes a step.  He has diabetes with currently elevated A1c levels. No diagnosis of neuropathy, and he does not experience burning pain, pins and needles sensation, or numbness in his feet.  He previously took 800 mg ibuprofen for years, leading to kidney and gastric issues, and now can only take Tylenol  for pain management. He has not used Voltaren  gel before.  He lives on less than a thousand dollars a month from [employer] and expresses financial constraints, stating he can't get anything the insurance doesn't pay for.      Objective:    Physical Exam VASCULAR: DP and PT pulse palpable. Foot is warm and well-perfused. Capillary fill time is brisk. DERMATOLOGIC: Normal skin turgor, texture, and temperature. No open lesions, rashes, or ulcerations. NEUROLOGIC: Normal sensation to light touch and pressure. No paresthesias. ORTHOPEDIC: Pain on palpation of dorsal midfoot in midtarsal joint. Bilateral pes planus deformity. Smooth pain-free range of motion of all examined joints. No ecchymosis or bruising. No gross deformity.   No images are attached to the encounter.    Results RADIOLOGY Foot X-ray: Moderate osteoarthritis of the midtarsal joints (06/17/2024)   Assessment:   1. Arthritis of midtarsal joints of both  feet      Plan:  Patient was evaluated and treated and all questions answered.  Assessment and Plan Assessment & Plan Osteoarthritis of bilateral midtarsal joints of feet Chronic osteoarthritis in the midtarsal joints of both feet, causing significant pain, especially when walking. Exacerbated by pes planus deformity, leading to increased stress on the joints. X-rays confirm moderate osteoarthritis. Pain is sharp, primarily in the middle and balls of the feet. Joint injections are challenging due to anatomical location, and joint replacement is not an option. Topical NSAIDs like Voltaren  gel are considered safe due to minimal systemic absorption, avoiding adverse effects associated with oral NSAIDs. - Prescribe Voltaren  gel for topical use three to four times daily. - Recommend Tylenol  Arthritis Strength for pain management. - Discuss potential for future steroid injection if no improvement. - Consider referral for diabetic shoes and insoles if insurance coverage is possible.  Bilateral pes planus (flexible flatfoot) Bilateral pes planus contributing to pain and discomfort in the feet. Leads to a collapse of the arch, increasing stress on the midtarsal joints and exacerbating osteoarthritis symptoms. Arch supports or insoles are recommended to provide support and alleviate pain. Insurance coverage for insoles may be limited, and financial constraints are a concern. - Recommend arch supports or insoles, specifically Power Steps, to support the arch and prevent rolling in. - Explore options for insurance-covered diabetic shoes and insoles, though coverage may be limited to patients with ulcers or amputations. - Refer to a specialist for diabetic shoes and insoles if insurance coverage is possible.  Return in about 8 weeks (around 08/12/2024) for Follow-up arthritis both feet.    "

## 2024-06-17 NOTE — Patient Instructions (Signed)
  VISIT SUMMARY: Today, we discussed your ongoing foot pain and its connection to osteoarthritis and flat feet. We reviewed your current medications and financial constraints, and we developed a plan to help manage your symptoms while considering your insurance coverage.  YOUR PLAN: -OSTEOARTHRITIS OF BILATERAL MIDTARSAL JOINTS OF FEET: Osteoarthritis is a condition where the cartilage in your joints wears down over time, causing pain and stiffness. In your case, it affects the middle and balls of both feet. We will start you on Voltaren  gel, which you should apply three to four times daily, and continue with Tylenol  Arthritis Strength for pain relief. If your pain does not improve, we may consider a steroid injection in the future. We will also look into getting you diabetic shoes and insoles if your insurance covers them.  -BILATERAL PES PLANUS (FLEXIBLE FLATFOOT): Pes planus, or flat feet, means that the arches of your feet have collapsed, which can increase stress on your joints and worsen osteoarthritis symptoms. We recommend using arch supports or insoles, such as Power Steps, to help support your arches and reduce pain. We will also explore options for insurance-covered diabetic shoes and insoles, and refer you to a specialist if coverage is possible.  INSTRUCTIONS: Please start using the Voltaren  gel three to four times daily and continue taking Tylenol  Arthritis Strength as needed for pain. If your pain does not improve, we may consider a steroid injection in the future. Additionally, we will explore options for diabetic shoes and insoles through your insurance and refer you to a specialist if coverage is possible.                      Contains text generated by Abridge.                                 Contains text generated by Abridge.

## 2024-06-21 ENCOUNTER — Ambulatory Visit (INDEPENDENT_AMBULATORY_CARE_PROVIDER_SITE_OTHER)

## 2024-06-21 ENCOUNTER — Other Ambulatory Visit: Payer: Self-pay

## 2024-06-21 DIAGNOSIS — Z23 Encounter for immunization: Secondary | ICD-10-CM

## 2024-06-21 MED ORDER — COVID-19 MRNA VACC (MODERNA) 50 MCG/0.5ML IM SUSY: 0.5000 mL | PREFILLED_SYRINGE | Freq: Once | INTRAMUSCULAR | 0 refills | Status: DC

## 2024-06-21 MED ORDER — COVID-19 MRNA VAC-TRIS(PFIZER) 30 MCG/0.3ML IM SUSY: 0.3000 mL | PREFILLED_SYRINGE | Freq: Once | INTRAMUSCULAR | 0 refills | Status: AC

## 2024-06-21 NOTE — Progress Notes (Signed)
 Patient is in office today for a nurse visit for Immunization. Patient Injection was given in the  Left deltoid. Patient tolerated injection well.

## 2024-07-01 DIAGNOSIS — D631 Anemia in chronic kidney disease: Secondary | ICD-10-CM | POA: Diagnosis not present

## 2024-07-01 DIAGNOSIS — N182 Chronic kidney disease, stage 2 (mild): Secondary | ICD-10-CM | POA: Diagnosis not present

## 2024-07-01 DIAGNOSIS — E1122 Type 2 diabetes mellitus with diabetic chronic kidney disease: Secondary | ICD-10-CM | POA: Diagnosis not present

## 2024-07-01 DIAGNOSIS — I10 Essential (primary) hypertension: Secondary | ICD-10-CM | POA: Diagnosis not present

## 2024-07-01 LAB — BASIC METABOLIC PANEL WITH GFR
BUN: 19 (ref 4–21)
CO2: 26 — AB (ref 13–22)
Creatinine: 1.1 (ref 0.6–1.3)
Glucose: 92

## 2024-07-01 LAB — COMPREHENSIVE METABOLIC PANEL WITH GFR
Albumin: 3.9 (ref 3.5–5.0)
Calcium: 9 (ref 8.7–10.7)
eGFR: 69

## 2024-07-06 ENCOUNTER — Telehealth: Payer: Self-pay

## 2024-07-06 ENCOUNTER — Other Ambulatory Visit: Payer: Self-pay | Admitting: "Endocrinology

## 2024-07-06 ENCOUNTER — Ambulatory Visit: Admitting: "Endocrinology

## 2024-07-06 ENCOUNTER — Encounter: Payer: Self-pay | Admitting: "Endocrinology

## 2024-07-06 ENCOUNTER — Telehealth: Payer: Self-pay | Admitting: "Endocrinology

## 2024-07-06 VITALS — BP 118/60 | HR 60 | Ht 66.0 in | Wt 199.4 lb

## 2024-07-06 DIAGNOSIS — Z794 Long term (current) use of insulin: Secondary | ICD-10-CM | POA: Diagnosis not present

## 2024-07-06 DIAGNOSIS — E782 Mixed hyperlipidemia: Secondary | ICD-10-CM

## 2024-07-06 DIAGNOSIS — I10 Essential (primary) hypertension: Secondary | ICD-10-CM | POA: Diagnosis not present

## 2024-07-06 DIAGNOSIS — E039 Hypothyroidism, unspecified: Secondary | ICD-10-CM | POA: Diagnosis not present

## 2024-07-06 DIAGNOSIS — E1159 Type 2 diabetes mellitus with other circulatory complications: Secondary | ICD-10-CM | POA: Diagnosis not present

## 2024-07-06 LAB — POCT GLYCOSYLATED HEMOGLOBIN (HGB A1C): HbA1c, POC (controlled diabetic range): 9.6 % — AB (ref 0.0–7.0)

## 2024-07-06 MED ORDER — ACCU-CHEK GUIDE TEST VI STRP: ORAL_STRIP | 12 refills | Status: AC

## 2024-07-06 MED ORDER — NOVOLOG FLEXPEN 100 UNIT/ML ~~LOC~~ SOPN: 6.0000 [IU] | PEN_INJECTOR | Freq: Three times a day (TID) | SUBCUTANEOUS | 2 refills | Status: DC

## 2024-07-06 MED ORDER — INSULIN LISPRO (1 UNIT DIAL) 100 UNIT/ML (KWIKPEN): 6.0000 [IU] | PEN_INJECTOR | Freq: Three times a day (TID) | SUBCUTANEOUS | 2 refills | Status: DC

## 2024-07-06 MED ORDER — BD PEN NEEDLE NANO U/F 32G X 4 MM MISC: 1.0000 | Freq: Four times a day (QID) | 5 refills | Status: DC

## 2024-07-06 MED ORDER — ACCU-CHEK GUIDE TEST VI STRP: ORAL_STRIP | 12 refills | Status: DC

## 2024-07-06 NOTE — Patient Instructions (Signed)

## 2024-07-06 NOTE — Telephone Encounter (Signed)
 Copied from CRM #[id number]. Topic: General - Inquiry >> Jul 06, 2024  4:04 PM Alfonso HERO wrote: Patient calling to request ref to hematology due to low numbers in bloodwork.[email or web address]

## 2024-07-06 NOTE — Progress Notes (Signed)
 07/06/2024, 4:11 PM   Endocrinology follow-up note  Subjective:    Patient ID: Ryan Herrera, male    DOB: 1946/04/17.  Ryan Herrera is being seen in follow-up after he was feeling consultation for management of currently uncontrolled symptomatic diabetes requested by  Bevely Doffing, FNP.   Past Medical History:  Diagnosis Date   Arthritis    hands   Coronary artery disease    a. s/p CABG in 2008 at [facility] b. re-do CABG at [facility] in 03/2022 with left RA-RI and SVG-PDA   Diabetes mellitus without complication ([name])    GERD (gastroesophageal reflux disease)    H/O poliomyelitis    age 78.  now has weak stomach muscles   Hypertension    IBS (irritable bowel syndrome)    Iron deficiency anemia 09/22/2018   Myocardial infarct [facility])    Renal insufficiency    Thyroid  disease     Past Surgical History:  Procedure Laterality Date   BACK SURGERY  1967   BACK SURGERY  10/04/2005   benign tumor removal  2004   BIOPSY  11/18/2022   Procedure: BIOPSY;  Surgeon: Cindie Carlin POUR, DO;  [facility]: [facility];  Service: Endoscopy;;   COLONOSCOPY WITH PROPOFOL  N/A 11/18/2022   Procedure: COLONOSCOPY WITH PROPOFOL ;  Surgeon: Cindie Carlin POUR, DO;  Location: [facility];  Service: Endoscopy;  Laterality: N/A;  8:30 am, pt did not want early appt. moved down (per PAT)   CORONARY ARTERY BYPASS GRAFT  2008   [facility] - 3 vessel   CORONARY ARTERY BYPASS GRAFT N/A 03/28/2022   Procedure: REDO CORONARY ARTERY BYPASS GRAFTING (CABG) X2 BYPASSES USING OPEN LEFT RADIAL ARTERY AND ENDOSCOPIC RIGHT GREATER SAPHENOUS VEIN HARVEST;  Surgeon: Kerrin Elspeth BROCKS, MD;  Location: [facility] OR;  Service: Open Heart Surgery;  Laterality: N/A;   CYSTOSCOPY WITH INSERTION OF UROLIFT     ESOPHAGOGASTRODUODENOSCOPY (EGD) WITH PROPOFOL  N/A 11/18/2022   Procedure: ESOPHAGOGASTRODUODENOSCOPY (EGD) WITH PROPOFOL ;  Surgeon: Cindie Carlin POUR, DO;  Location[facility];  Service:  Endoscopy;  Laterality: N/A;   HERNIA REPAIR Bilateral    inguinal   HYDROCELE EXCISION / REPAIR     JOINT REPLACEMENT     left knee x2, rright x1   LEFT HEART CATH AND CORS/GRAFTS ANGIOGRAPHY N/A 03/25/2022   Procedure: LEFT HEART CATH AND CORS/GRAFTS ANGIOGRAPHY;  Surgeon: Dann Candyce RAMAN, MD;  Location: [facility] INVASIVE CV LAB;  Service: Cardiovascular;  Laterality: N/A;   POLYPECTOMY  11/18/2022   Procedure: POLYPECTOMY;  Surgeon: Cindie Carlin POUR, DO;  Location[facility];  Service: Endoscopy;;   PROSTATE SURGERY N/A 01/2021   TEE WITHOUT CARDIOVERSION N/A 03/28/2022   Procedure: TRANSESOPHAGEAL ECHOCARDIOGRAM (TEE);  Surgeon: Kerrin Elspeth BROCKS, MD;  Location: [facility] OR;  Service: Open Heart Surgery;  Laterality: N/A;   TONSILLECTOMY      Social History   Socioeconomic History   Marital status: Divorced    Spouse name: Not on file   Number of children: 1   Years of education: 12   Highest education level: Bachelor's degree (e.g., BA, AB, BS)  Occupational History   Occupation: retired  Tobacco Use   Smoking status: Former    Current packs/day: 0.00    Average packs/day: 1 pack/day for 38.0 years (38.0 ttl pk-yrs)    Types: Cigarettes    Start date: 12/1958    Quit date: 12/1996    Years since quitting: 46.6  Smokeless tobacco: Never  Vaping Use   Vaping status: Never Used  Substance and Sexual Activity   Alcohol use: Yes    Comment: occassionally   Drug use: No   Sexual activity: Not Currently  Other Topics Concern   Not on file  Social History Narrative   Lives with his significant other, pt is retired    Teacher, early years/pre Strain: Low Risk  (12/08/2023)   Overall Financial Resource Strain (CARDIA)    Difficulty of Paying Living Expenses: Not hard at all  Food Insecurity: Low Risk  (04/26/2024)   Received from [facility]   Hunger Vital Sign    Within the past 12 months, you worried that your food would run out before you got  money to buy more: Never true    Within the past 12 months, the food you bought just didn't last and you didn't have money to get more. : Never true  Transportation Needs: No Transportation Needs (04/26/2024)   Received from [facility]    In the past 12 months, has lack of reliable transportation kept you from medical appointments, meetings, work or from getting things needed for daily living? : No  Physical Activity: Inactive (12/08/2023)   Exercise Vital Sign    Days of Exercise per Week: 0 days    Minutes of Exercise per Session: 0 min  Stress: No Stress Concern Present (12/18/2023)   [employer] of Occupational Health - Occupational Stress Questionnaire    Feeling of Stress : Only a little  Social Connections: Moderately Integrated (12/18/2023)   Social Connection and Isolation Panel    Frequency of Communication with Friends and Family: More than three times a week    Frequency of Social Gatherings with Friends and Family: More than three times a week    Attends Religious Services: Never    Database administrator or Organizations: Yes    Attends Engineer, structural: More than 4 times per year    Marital Status: Living with partner    Family History  Problem Relation Age of Onset   Hypertension Mother    Heart attack Mother    Osteoarthritis Mother    Stroke Father    Hypertension Father    Stroke Sister    Stroke Sister    Colon cancer Neg Hx    Prostate cancer Neg Hx    Cancer - Lung Neg Hx     Outpatient Encounter Medications as of 07/06/2024  Medication Sig   insulin  aspart (NOVOLOG  FLEXPEN) 100 UNIT/ML FlexPen Inject 6-12 Units into the skin 3 (three) times daily with meals.   Insulin  Pen Needle (BD PEN NEEDLE NANO U/F) 32G X 4 MM MISC 1 each by Does not apply route 4 (four) times daily.   acetaminophen  (TYLENOL ) 650 MG CR tablet Take 1,300 mg by mouth every 8 (eight) hours as needed for pain.   allopurinol  (ZYLOPRIM ) 100 MG tablet Take  1 tablet (100 mg total) by mouth daily.   aspirin  EC 81 MG tablet Take 1 tablet (81 mg total) by mouth daily.   Blood Glucose Monitoring Suppl (ACCU-CHEK GUIDE ME) w/Device KIT 1 Piece by Does not apply route as directed.   Carboxymethylcellulose Sodium (THERATEARS PF OP) Place 1 drop into both eyes daily as needed (dry eyes).   carvedilol  (COREG ) 6.25 MG tablet TAKE 1 TABLET BY MOUTH TWICE A DAY   Cholecalciferol (D3 2000) 50 MCG (2000 UT) CAPS  Take 1 capsule by mouth daily.   cyanocobalamin  (VITAMIN B12) 1000 MCG tablet Take 1,000 mcg by mouth daily.   diclofenac  Sodium (VOLTAREN ) 1 % GEL Apply 4 g topically 4 (four) times daily.   Evolocumab  (REPATHA  SURECLICK) 140 MG/ML SOAJ INJECT 140 MG INTO THE SKIN EVERY 14 (FOURTEEN) DAYS.   ezetimibe  (ZETIA ) 10 MG tablet Take 1 tablet (10 mg total) by mouth daily.   fluconazole  (DIFLUCAN ) 200 MG tablet Take 1 tablet (200 mg total) by mouth once a week.   fluocinonide  (LIDEX ) 0.05 % external solution Apply 1 Application topically 2 (two) times daily as needed. To scalp   fluocinonide  (LIDEX ) 0.05 % external solution Apply 1 Application topically daily.   furosemide  (LASIX ) 20 MG tablet Take 1 tablet (20 mg total) by mouth as needed (FOR WEIGHT GAIN > 2-3POUNDS OVERNIGHT OR>5 POUNDS IN A WEEK).   glipiZIDE  (GLUCOTROL  XL) 5 MG 24 hr tablet Take 1 tablet (5 mg total) by mouth 2 (two) times daily.   glucose blood (ACCU-CHEK GUIDE TEST) test strip Use to monitor glucose 4  times daily as instructed   hydrochlorothiazide  (HYDRODIURIL ) 25 MG tablet Take 1 tablet (25 mg total) by mouth daily.   insulin  degludec (TRESIBA  FLEXTOUCH) 100 UNIT/ML FlexTouch Pen Inject 40 Units into the skin at bedtime.   ipratropium (ATROVENT ) 0.03 % nasal spray Place 2 sprays into both nostrils every 12 (twelve) hours.   ketoconazole  (NIZORAL ) 2 % shampoo Apply 1 Application topically 2 (two) times a week.   levothyroxine  (SYNTHROID ) 112 MCG tablet Take 1 tablet (112 mcg total) by  mouth daily.   metFORMIN  (GLUCOPHAGE -XR) 500 MG 24 hr tablet TAKE 1 TABLET BY MOUTH EVERY DAY WITH BREAKFAST   mupirocin  cream (BACTROBAN ) 2 % APPLY 1 APPLICATION TOPICALLY 2 (TWO) TIMES DAILY. APPLY TO GROIN   nystatin  cream (MYCOSTATIN ) APPLY TO THE GROIN AREA ONCE DAILY   olmesartan  (BENICAR ) 20 MG tablet Take 1 tablet (20 mg total) by mouth daily.   pantoprazole  (PROTONIX ) 40 MG tablet TAKE 1 TABLET BY MOUTH TWICE A DAY   tadalafil (CIALIS) 5 MG tablet Take 5 mg by mouth daily.   triamcinolone  cream (KENALOG ) 0.1 % Apply 1 Application topically 2 (two) times daily as needed. For feet   valACYclovir  (VALTREX ) 500 MG tablet TAKE 1 TABLET BY MOUTH TWICE A DAY   [DISCONTINUED] glucose blood (ACCU-CHEK GUIDE TEST) test strip Use to monitor glucose 2 times daily as instructed   [DISCONTINUED] glucose blood (ACCU-CHEK GUIDE TEST) test strip Use to monitor glucose 2 times daily as instructed   No facility-administered encounter medications on file as of 07/06/2024.    ALLERGIES: Allergies  Allergen Reactions   Myrbetriq [Mirabegron] Nausea And Vomiting   2,4-D Dimethylamine Other (See Comments)   Jardiance  [Empagliflozin ]     Pt does not like this medication   Niacin And Related Itching   Nsaids Other (See Comments)    Kidney damage  Non-steroidal anti-inflammatory agent (product)   Other Diarrhea and Nausea And Vomiting    PT does not want OPIOIDS   GLP-1 drugs - Nausea, vomiting, diarrhea, belching   Statins Itching    VACCINATION STATUS: Immunization History  Administered Date(s) Administered   Fluad Quad(high Dose 65+) 06/14/2022   Fluad Trivalent(High Dose 65+) 06/24/2023   INFLUENZA, HIGH DOSE SEASONAL PF 06/13/2015, 07/18/2016, 06/11/2017, 07/08/2018, 07/02/2019, 09/15/2020, 07/27/2021, 06/21/2024   Influenza Split 07/19/2013   Influenza,inj,Quad PF,6+ Mos 08/08/2014   Influenza-Unspecified 07/15/2012, 07/15/2013, 08/08/2014, 06/13/2015, 07/18/2016   PFIZER Comirnaty(Gray  Top)Covid-19 Tri-Sucrose Vaccine 11/27/2019, 12/19/2019, 07/26/2020, 04/20/2021   PPD Test 01/22/2013, 01/22/2013   Pfizer Covid-19 Vaccine Bivalent Booster 50yrs & up 07/27/2021   Pneumococcal Conjugate-13 05/04/2014   Pneumococcal Polysaccharide-23 07/15/2012   Td 02/15/2003   Tdap 05/04/2014, 03/09/2022   Zoster Recombinant(Shingrix) 02/12/2017, 02/13/2017, 06/04/2017    Diabetes He presents for his follow-up diabetic visit. He has type 2 diabetes mellitus. Onset time: He was diagnosed at approximate age of 48 years. His disease course has been worsening. There are no hypoglycemic associated symptoms. Pertinent negatives for hypoglycemia include no confusion, headaches, pallor or seizures. Pertinent negatives for diabetes include no chest pain, no fatigue, no polydipsia, no polyphagia, no polyuria and no weakness. There are no hypoglycemic complications. Symptoms are worsening. Diabetic complications include heart disease. Risk factors for coronary artery disease include dyslipidemia, diabetes mellitus, family history, obesity, male sex, hypertension, tobacco exposure and sedentary lifestyle. Current diabetic treatment includes insulin  injections (He is currently on Tresiba  35 units daily, metformin  500 mg p.o. once daily, glipizide  5 mg p.o. twice daily.). His weight is fluctuating minimally. He is following a generally unhealthy diet. When asked about meal planning, he reported none. He rarely participates in exercise. His home blood glucose trend is increasing steadily. His breakfast blood glucose range is generally 140-180 mg/dl. His overall blood glucose range is 140-180 mg/dl. Thermon presents with a meter showing uncontrolled fasting glycemic profile averaging 144-173 mg per DL for the last 14 days.  His point-of-care A1c is 9.6%, increasing from 8.3% during his last visit.  He is requesting prandial insulin  and brought pain of NovoLog  which his friends is taking and so great results.  He has  been a candidate for multiple daily injections of insulin , however he did not accept this approach for almost 2 years.    He did not document hypoglycemia.     ) An ACE inhibitor/angiotensin II receptor blocker is being taken.  Hyperlipidemia This is a chronic problem. The current episode started more than 1 year ago. The problem is controlled. Exacerbating diseases include diabetes. Pertinent negatives include no chest pain, myalgias or shortness of breath. Treatments tried: He is on Repatha  injection as well as Zetia  10 mg p.o. daily. Risk factors for coronary artery disease include dyslipidemia, diabetes mellitus, family history, obesity, male sex, hypertension and a sedentary lifestyle.  Hypertension This is a chronic problem. The current episode started more than 1 year ago. Pertinent negatives include no chest pain, headaches, neck pain, palpitations or shortness of breath. Risk factors for coronary artery disease include family history, dyslipidemia, diabetes mellitus, male gender, obesity and sedentary lifestyle. Past treatments include angiotensin blockers. Hypertensive end-organ damage includes CAD/MI.     Objective:       07/06/2024    2:59 PM 06/17/2024    3:28 PM 05/17/2024    2:03 PM  Vitals with BMI  Height 5' 6 5' 6   Weight 199 lbs 6 oz 199 lbs 3 oz   BMI 32.2 32.17   Systolic 118  136  Diastolic 60  68  Pulse 60      BP 118/60   Pulse 60   Ht 5' 6 (1.676 m)   Wt 199 lb 6.4 oz (90.4 kg)   BMI 32.18 kg/m   Wt Readings from Last 3 Encounters:  07/06/24 199 lb 6.4 oz (90.4 kg)  06/17/24 199 lb 3.2 oz (90.4 kg)  05/17/24 199 lb 3.2 oz (90.4 kg)     CMP ( most recent) CMP  Component Value Date/Time   NA 134 02/03/2024 1054   NA 139 04/29/2014 1500   K 4.3 02/03/2024 1054   K 4.2 04/29/2014 1500   CL 98 02/03/2024 1054   CL 103 04/29/2014 1500   CO2 26 (A) 07/01/2024 0000   CO2 27 04/29/2014 1500   GLUCOSE 222 (H) 02/03/2024 1054   GLUCOSE 227 (H)  06/22/2023 0603   GLUCOSE 111 (H) 04/29/2014 1500   BUN 19 07/01/2024 0000   BUN 16 04/29/2014 1500   CREATININE 1.1 07/01/2024 0000   CREATININE 1.15 02/03/2024 1054   CREATININE 1.14 04/29/2014 1500   CALCIUM  9.0 07/01/2024 0000   CALCIUM  9.2 04/29/2014 1500   PROT 6.2 (L) 06/22/2023 0603   PROT 6.9 01/22/2023 1301   ALBUMIN  3.9 07/01/2024 0000   ALBUMIN  4.0 01/22/2023 1301   AST 28 06/22/2023 0603   ALT 37 06/22/2023 0603   ALKPHOS 76 06/22/2023 0603   BILITOT 0.5 06/22/2023 0603   BILITOT 0.3 01/22/2023 1301   GFRNONAA >60 06/22/2023 0603   GFRNONAA >60 04/29/2014 1500   GFRAA >60 05/12/2020 1848   GFRAA >60 04/29/2014 1500     Diabetic Labs (most recent): Lab Results  Component Value Date   HGBA1C 9.6 (A) 07/06/2024   HGBA1C 8.3 (A) 03/04/2024   HGBA1C 8.5 (A) 09/29/2023    Lipid Panel     Component Value Date/Time   CHOL 99 (L) 03/22/2024 1424   TRIG 61 03/22/2024 1424   HDL 44 03/22/2024 1424   CHOLHDL 2.3 03/22/2024 1424   CHOLHDL 3.2 02/14/2023 0425   VLDL 18 02/14/2023 0425   LDLCALC 41 03/22/2024 1424   LABVLDL 14 03/22/2024 1424        Lab Results  Component Value Date   TSH 6.480 (H) 03/22/2024   TSH 3.950 01/22/2023   TSH 2.990 10/02/2022   TSH 2.349 03/24/2022   TSH 5.760 (H) 03/12/2022   TSH 4.124 12/21/2018   TSH 5.545 (H) 09/22/2018   TSH 4.84 (H) 04/29/2014   FREET4 1.17 03/22/2024   FREET4 1.62 01/22/2023   FREET4 1.44 10/02/2022   FREET4 1.14 (H) 03/24/2022   FREET4 1.01 12/21/2018      Assessment & Plan:   1. DM type 2 causing vascular disease ([name])  - Elsie KANDICE Sailors has currently uncontrolled symptomatic type 2 DM since  78 years of age.  Trennon presents with a meter showing uncontrolled fasting glycemic profile averaging 144-173 mg per DL for the last 14 days.  His point-of-care A1c is 9.6%, increasing from 8.3% during his last visit.  He is requesting prandial insulin  and brought pain of NovoLog  which his friends  is taking and so great results.  He has been a candidate for multiple daily injections of insulin , however he did not accept this approach for almost 2 years.    He did not document hypoglycemia.     Recent labs reviewed.  - I had a long discussion with him about the progressive nature of diabetes and the pathology behind its complications. -his diabetes is complicated by coronary artery disease status post cardiac bypass in 2008, recurrent coronary artery disease, obesity/sedentary life, comorbid hypertension /hyperlipidemia, history of smoking and he remains at a high risk for more acute and chronic complications which include CAD, CVA, CKD, retinopathy, and neuropathy. These are all discussed in detail with him.  - I discussed all available options of managing his diabetes including de-escalation of medications.  He did not engage optimally, however Patient is  encouraged to switch to  unprocessed or minimally processed  complex starch, adequate protein intake (mainly plant source), minimal liquid fat ( mainly vegetable oils), plenty of fruits, and vegetables.  -  he is advised to stick to a routine mealtimes to eat 3 complete meals a day and snack only when necessary ( to snack only to correct hypoglycemia BG <70 day time or <100 at night).   - he acknowledges that there is a room for improvement in his food and drink choices. - Suggestion is made for him to avoid simple carbohydrates  from his diet including Cakes, Sweet Desserts, Ice Cream, Soda (diet and regular), Sweet Tea, Candies, Chips, Cookies, Store Bought Juices, Alcohol , Artificial Sweeteners,  Coffee Creamer, and Sugar-free Products, Lemonade. This will help patient to have more stable blood glucose profile and potentially avoid unintended weight gain.   - he will be scheduled with Santana Duke, RDN, CDE for individualized diabetes education.  - I have approached him with the following plan to manage  his diabetes and patient  agrees:   -Based on his presentation with significant loss of control of glycemia, he will need multiple daily injections of insulin .  At his request, he is given a prescription for NovoLog  6 units 3 times daily AC plus correction dose.  He is urged to start monitoring blood glucose 4 times a day-before meals and at bedtime.  NovoLog  dosing instruction was strictly discussed and provided to him in writing. -He is advised to continue Tresiba  40 units nightly.  He would have benefited from CGM, however he does not want to wear a CGM.     -He is advised to continue metformin  500 mg p.o. once a day, and he would like to keep  glipizide  5 mg p.o. twice daily with breakfast and supper.   He did have intolerance to SGLT2 inhibitors and wishes to avoid this medications. - he is encouraged to monitor glucose at least 2 times daily- before breakfast and at bed time, call clinic for blood glucose levels less than 70 or above 200 mg /dl fasting k6/tzzx at fasting.   - Specific targets for  A1c;  LDL, HDL,  and Triglycerides were discussed with the patient.  2) Blood Pressure /Hypertension:   His blood pressure is controlled to target.  he is advised to continue his current medications including losartan  25 mg p.o. daily with breakfast .   3) Lipids/Hyperlipidemia:   Review of his recent lipid panel showed controlled LDL at 47.  He reports statin intolerance.    He is advised to continue Repatha  140 mg every 14 days, Zetia  10 mg every night.   Side effects and precautions discussed with him.   4)  Weight/Diet:  Body mass index is 32.18 kg/m.  -   clearly complicating his diabetes care.   he is  a candidate for weight loss. I discussed with him the fact that loss of 5 - 10% of his  current body weight will have the most impact on his diabetes management.  The above detailed  ACLM recommendations for nutrition, exercise, sleep, social life, avoidance of risky substances, the need for restorative sleep    information will also detailed on discharge instructions.  5) Chronic Care/Health Maintenance:  -he  is on ARB  medications and  is encouraged to initiate and continue to follow up with Ophthalmology, Dentist,  Podiatrist at least yearly or according to recommendations, and advised to   stay away from smoking. I have  recommended yearly flu vaccine and pneumonia vaccine at least every 5 years; moderate intensity exercise for up to 150 minutes weekly; and  sleep for 7- 9 hours a day.   6) hypothyroidism: Circumstance of diagnosis not available to review.  His recent thyroid  function tests were consistent with inadequate replacement.  In the interval, his levothyroxine  was increased to 112 mcg p.o. daily before breakfast.      - We discussed about the correct intake of his thyroid  hormone, on empty stomach at fasting, with water, separated by at least 30 minutes from breakfast and other medications,  and separated by more than 4 hours from calcium , iron, multivitamins, acid reflux medications (PPIs). -Patient is made aware of the fact that thyroid  hormone replacement is needed for life, dose to be adjusted by periodic monitoring of thyroid  function tests.   - he is  advised to maintain close follow up with Bevely Doffing, FNP for primary care needs, as well as his other providers for optimal and coordinated care.   I spent  26  minutes in the care of the patient today including review of labs from CMP, Lipids, Thyroid  Function, Hematology (current and previous including abstractions from other facilities); face-to-face time discussing  his blood glucose readings/logs, discussing hypoglycemia and hyperglycemia episodes and symptoms, medications doses, his options of short and long term treatment based on the latest standards of care / guidelines;  discussion about incorporating lifestyle medicine;  and documenting the encounter. Risk reduction counseling performed per USPSTF guidelines to reduce obesity  and cardiovascular risk factors.     Please refer to Patient Instructions for Blood Glucose Monitoring and Insulin /Medications Dosing Guide  in media tab for additional information. Please  also refer to  Patient Self Inventory in the Media  tab for reviewed elements of pertinent patient history.  Elsie KANDICE Sailors participated in the discussions, expressed understanding, and voiced agreement with the above plans.  All questions were answered to his satisfaction. he is encouraged to contact clinic should he have any questions or concerns prior to his return visit.   Follow up plan: - Return in about 3 months (around 10/05/2024) for Bring Meter/CGM Device/Logs- A1c in Office.  Ranny Earl, MD [facility] [address] [place], [place] [address] Phone: [phone]  Fax: [phone]    07/06/2024, 4:11 PM  This note was partially dictated with voice recognition software. Similar sounding words can be transcribed inadequately or may not  be corrected upon review.

## 2024-07-06 NOTE — Telephone Encounter (Signed)
 Can you switch him from Novolog  to Humalog. Novolog  is not covered by insurance

## 2024-07-07 ENCOUNTER — Other Ambulatory Visit: Payer: Self-pay

## 2024-07-07 DIAGNOSIS — D631 Anemia in chronic kidney disease: Secondary | ICD-10-CM | POA: Diagnosis not present

## 2024-07-07 DIAGNOSIS — D649 Anemia, unspecified: Secondary | ICD-10-CM

## 2024-07-07 DIAGNOSIS — N1831 Chronic kidney disease, stage 3a: Secondary | ICD-10-CM | POA: Diagnosis not present

## 2024-07-07 NOTE — Telephone Encounter (Signed)
Pt informed

## 2024-07-09 ENCOUNTER — Inpatient Hospital Stay

## 2024-07-09 ENCOUNTER — Inpatient Hospital Stay: Attending: Hematology and Oncology | Admitting: Hematology and Oncology

## 2024-07-09 VITALS — BP 133/65 | HR 59 | Temp 98.0°F | Resp 18 | Ht 66.0 in | Wt 199.3 lb

## 2024-07-09 DIAGNOSIS — Z79899 Other long term (current) drug therapy: Secondary | ICD-10-CM | POA: Diagnosis not present

## 2024-07-09 DIAGNOSIS — D649 Anemia, unspecified: Secondary | ICD-10-CM | POA: Diagnosis not present
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
## 2024-07-09 NOTE — Assessment & Plan Note (Signed)
 Lab review: 02/13/2023: Hemoglobin 11.3, MCV 89.7, platelets 128 10/27/2023: Hemoglobin 11.9, MCV 87, platelets 178 03/22/2024: B12 1152, folate 10.8, tissue transglutaminase antibody: Less than 2 07/01/2024: Hemoglobin 11.7, MCV 87.5, platelets 158, TIBC 352, iron saturation 17%, hemoglobin A1c 9.6  Mild chronic normocytic anemia: Upon review of his blood work it does not appear to be clearly iron deficiency or B12 or folate deficiency. This does appear to be anemia of chronic disease possibly from underlying inflammation. I discussed with the patient different causes of normocytic anemia.  Differential diagnosis: 1. Anemia due to chronic disease and inflammation 2. Hemolysis 3. Hypothyroidism 4. Plasma cell disorders myeloma 5. Bone marrow dysfunction with MDS  Workup performed: 1. CBC with differential to evaluate the smear 2. CMP to evaluate liver and kidney function 3. Haptoglobin, LDH, reticulocyte count to evaluate hemolysis 4. TSH 5. SPEP 6.  Ferritin  Since the degree of anemia is not significant, if we do not find any clear-cut etiology by the above blood work then I suggest that we continue to watch and monitor and reconsult us  if the hemoglobin were to drop below 10 because the next step of workup would be to perform a bone marrow biopsy.

## 2024-07-14 ENCOUNTER — Inpatient Hospital Stay: Admitting: Licensed Clinical Social Worker

## 2024-07-14 ENCOUNTER — Inpatient Hospital Stay

## 2024-07-14 VITALS — BP 144/73 | HR 66 | Temp 97.1°F | Resp 16

## 2024-07-14 DIAGNOSIS — D509 Iron deficiency anemia, unspecified: Secondary | ICD-10-CM

## 2024-07-14 DIAGNOSIS — D649 Anemia, unspecified: Secondary | ICD-10-CM | POA: Diagnosis not present

## 2024-07-14 MED ORDER — CETIRIZINE HCL 10 MG/ML IV SOLN
10.0000 mg | Freq: Once | INTRAVENOUS | Status: AC
  Administered 2024-07-14: 10 mg via INTRAVENOUS
  Filled 2024-07-14: qty 1

## 2024-07-14 MED ORDER — SODIUM CHLORIDE 0.9 % IV SOLN
510.0000 mg | Freq: Once | INTRAVENOUS | Status: AC
  Administered 2024-07-14: 510 mg via INTRAVENOUS
  Filled 2024-07-14: qty 17

## 2024-07-14 MED ORDER — SODIUM CHLORIDE 0.9 % IV SOLN: INTRAVENOUS | Status: DC

## 2024-07-14 MED ORDER — DIPHENHYDRAMINE HCL 50 MG/ML IJ SOLN: 50.0000 mg | Freq: Once | INTRAMUSCULAR | Status: DC

## 2024-07-14 MED ORDER — ACETAMINOPHEN 325 MG PO TABS
650.0000 mg | ORAL_TABLET | Freq: Once | ORAL | Status: AC
  Administered 2024-07-14: 650 mg via ORAL
  Filled 2024-07-14: qty 2

## 2024-07-14 NOTE — Progress Notes (Signed)
 [facility] CSW Progress Note    Interventions: CSW received request to meet w/ pt regarding counseling.  Pt reports he has seen counselors in the past and has found it to be beneficial.  Pt states he and his significant other are both getting older and dealing w/ multiple health issues.  It has become stressful and pt is requesting counseling suggestions for emotional support.  CSW to email pt a list of counseling options.        Follow Up Plan:  Patient will contact CSW with any support or resource needs    Devere JONELLE Manna, LCSW Clinical Social Worker [facility]

## 2024-07-14 NOTE — Progress Notes (Signed)
 Feraheme iron infusion given per orders. Patient tolerated it well without problems. Vitals stable and discharged home from clinic ambulatory. Follow up as scheduled.

## 2024-07-14 NOTE — Patient Instructions (Signed)

## 2024-07-21 ENCOUNTER — Inpatient Hospital Stay

## 2024-07-21 VITALS — BP 127/54 | HR 64 | Temp 96.5°F | Resp 18

## 2024-07-21 DIAGNOSIS — D649 Anemia, unspecified: Secondary | ICD-10-CM | POA: Diagnosis not present

## 2024-07-21 DIAGNOSIS — D509 Iron deficiency anemia, unspecified: Secondary | ICD-10-CM

## 2024-07-21 MED ORDER — DIPHENHYDRAMINE HCL 50 MG/ML IJ SOLN
25.0000 mg | Freq: Once | INTRAMUSCULAR | Status: AC
  Administered 2024-07-21: 25 mg via INTRAVENOUS
  Filled 2024-07-21: qty 1

## 2024-07-21 MED ORDER — DIPHENHYDRAMINE HCL 50 MG/ML IJ SOLN: 50.0000 mg | Freq: Once | INTRAMUSCULAR | Status: DC

## 2024-07-21 MED ORDER — ACETAMINOPHEN 325 MG PO TABS
650.0000 mg | ORAL_TABLET | Freq: Once | ORAL | Status: AC
  Administered 2024-07-21: 650 mg via ORAL
  Filled 2024-07-21: qty 2

## 2024-07-21 MED ORDER — CETIRIZINE HCL 10 MG/ML IV SOLN: 10.0000 mg | Freq: Once | INTRAVENOUS | Status: DC

## 2024-07-21 MED ORDER — SODIUM CHLORIDE 0.9 % IV SOLN: INTRAVENOUS | Status: DC

## 2024-07-21 MED ORDER — SODIUM CHLORIDE 0.9 % IV SOLN
510.0000 mg | Freq: Once | INTRAVENOUS | Status: AC
  Administered 2024-07-21: 510 mg via INTRAVENOUS
  Filled 2024-07-21: qty 510

## 2024-07-21 NOTE — Progress Notes (Signed)
 Patient presents today for iron infusion.  Patient is in satisfactory condition with no new complaints voiced.  Vital signs are stable.  We will proceed with infusion per provider orders.    Peripheral IV started with good blood return pre and post infusion.  Feraheme  510 mg IV iron infusion given today per MD orders. Tolerated infusion without adverse affects. Vital signs stable. No complaints at this time. Discharged from clinic ambulatory in stable condition. Alert and oriented x 3. F/U with [name] as scheduled.

## 2024-07-21 NOTE — Patient Instructions (Signed)

## 2024-07-30 ENCOUNTER — Inpatient Hospital Stay: Admitting: Hematology

## 2024-07-30 ENCOUNTER — Inpatient Hospital Stay

## 2024-08-05 ENCOUNTER — Other Ambulatory Visit: Payer: Self-pay | Admitting: "Endocrinology

## 2024-08-05 DIAGNOSIS — E1159 Type 2 diabetes mellitus with other circulatory complications: Secondary | ICD-10-CM

## 2024-08-11 ENCOUNTER — Other Ambulatory Visit: Payer: Self-pay | Admitting: Medical Genetics

## 2024-08-12 ENCOUNTER — Ambulatory Visit (INDEPENDENT_AMBULATORY_CARE_PROVIDER_SITE_OTHER): Admitting: Podiatry

## 2024-08-12 VITALS — Ht 66.0 in | Wt 199.0 lb

## 2024-08-12 DIAGNOSIS — E1142 Type 2 diabetes mellitus with diabetic polyneuropathy: Secondary | ICD-10-CM

## 2024-08-12 DIAGNOSIS — M216X1 Other acquired deformities of right foot: Secondary | ICD-10-CM

## 2024-08-12 DIAGNOSIS — M216X2 Other acquired deformities of left foot: Secondary | ICD-10-CM | POA: Diagnosis not present

## 2024-08-12 DIAGNOSIS — L84 Corns and callosities: Secondary | ICD-10-CM | POA: Diagnosis not present

## 2024-08-12 MED ORDER — DICLOFENAC SODIUM 1 % EX GEL: 4.0000 g | Freq: Four times a day (QID) | CUTANEOUS | 4 refills | Status: AC

## 2024-08-15 ENCOUNTER — Encounter: Payer: Self-pay | Admitting: Podiatry

## 2024-08-15 NOTE — Progress Notes (Signed)
  Subjective:  Patient ID: VONDELL BABERS, male    DOB: [birth year],  MRN: [id number]  Chief Complaint  Patient presents with   Arthritis    Rm 8 Patient is here for diabetic shoes and inserts.    Discussed the use of AI scribe software for clinical note transcription with the patient, who gave verbal consent to proceed.  History of Present Illness Ryan Herrera is an 78 year old male with diabetes who presents with bilateral foot pain.  He returns for follow-up, has not had much change.  Has not heard nothing about diabetic shoes and did not pick up the prescription for the Voltaren  because he was not sure what that was for..      Objective:    Physical Exam VASCULAR: DP and PT pulse palpable. Foot is warm and well-perfused. Capillary fill time is brisk. DERMATOLOGIC: Normal skin turgor, texture, and temperature. No open lesions, rashes, or ulcerations. NEUROLOGIC: Normal sensation to light touch and pressure. No paresthesias.  Some scattered appreciation of toes of monofilament ORTHOPEDIC: Pain on palpation of dorsal midfoot in midtarsal joint. Bilateral pes planus deformity. Smooth pain-free range of motion of all examined joints. No ecchymosis or bruising. No gross deformity.   No images are attached to the encounter.    Results RADIOLOGY Foot X-ray: Moderate osteoarthritis of the midtarsal joints (06/17/2024)   Assessment:   1. Diabetic polyneuropathy associated with type 2 diabetes mellitus ([name])   2. Pronation of left foot   3. Pronation of right foot   4. Callus of foot      Plan:  Patient was evaluated and treated and all questions answered.  Assessment and Plan Assessment & Plan Osteoarthritis of bilateral midtarsal joints of feet Chronic osteoarthritis in the midtarsal joints of both feet, causing significant pain, especially when walking. Exacerbated by pes planus deformity, leading to increased stress on the joints. X-rays confirm moderate  osteoarthritis. Pain is sharp, primarily in the middle and balls of the feet. Joint injections are challenging due to anatomical location, and joint replacement is not an option. Topical NSAIDs like Voltaren  gel are considered safe due to minimal systemic absorption, avoiding adverse effects associated with oral NSAIDs. - Prescribe Voltaren  gel for topical use three to four times daily.  Rx was sent once again - Recommend Tylenol  Arthritis Strength for pain management.   Bilateral pes planus (flexible flatfoot), diabetic peripheral neuropathy Diabetic shoe referral was placed to [employer], [employer] for extra-depth epic shoes and multidensity insoles, these are medically necessary and which should also reduce his ulceration risk with his diabetes early neuropathy and flatfoot deformity.  I expect this also will help with his midtarsal arthritis.      No follow-ups on file.

## 2024-08-21 ENCOUNTER — Other Ambulatory Visit: Payer: Self-pay | Admitting: Family Medicine

## 2024-08-21 ENCOUNTER — Other Ambulatory Visit: Payer: Self-pay | Admitting: Dermatology

## 2024-08-26 ENCOUNTER — Encounter: Payer: Self-pay | Admitting: Podiatry

## 2024-08-26 ENCOUNTER — Ambulatory Visit (INDEPENDENT_AMBULATORY_CARE_PROVIDER_SITE_OTHER): Admitting: Podiatry

## 2024-08-26 DIAGNOSIS — M79675 Pain in left toe(s): Secondary | ICD-10-CM | POA: Diagnosis not present

## 2024-08-26 DIAGNOSIS — E1142 Type 2 diabetes mellitus with diabetic polyneuropathy: Secondary | ICD-10-CM

## 2024-08-26 DIAGNOSIS — B351 Tinea unguium: Secondary | ICD-10-CM

## 2024-08-26 DIAGNOSIS — M79674 Pain in right toe(s): Secondary | ICD-10-CM | POA: Diagnosis not present

## 2024-08-26 NOTE — Progress Notes (Signed)
This patient returns to my office for at risk foot care.  This patient requires this care by a professional since this patient will be at risk due to having diabetes.  This patient is unable to cut nails himself since the patient cannot reach his nails.These nails are painful walking and wearing shoes.  This patient presents for at risk foot care today.  General Appearance  Alert, conversant and in no acute stress.  Vascular  Dorsalis pedis and posterior tibial  pulses are palpable  bilaterally.  Capillary return is within normal limits  bilaterally. Temperature is within normal limits  bilaterally.  Neurologic  Senn-Weinstein monofilament wire test within normal limits  bilaterally. Muscle power within normal limits bilaterally.  Nails Thick disfigured discolored nails with subungual debris  from hallux to fifth toes bilaterally. No evidence of bacterial infection or drainage bilaterally.  Orthopedic  No limitations of motion  feet .  No crepitus or effusions noted.  No bony pathology or digital deformities noted.  Skin  normotropic skin with no porokeratosis noted bilaterally.  No signs of infections or ulcers noted.     Onychomycosis  Pain in right toes  Pain in left toes  Consent was obtained for treatment procedures.   Mechanical debridement of nails 1-5  bilaterally performed with a nail nipper.  Filed with dremel without incident.    Return office visit  10 weeks                    Told patient to return for periodic foot care and evaluation due to potential at risk complications.   Reesa Gotschall DPM   

## 2024-08-31 ENCOUNTER — Telehealth: Payer: Self-pay | Admitting: "Endocrinology

## 2024-08-31 NOTE — Telephone Encounter (Signed)
 Pt dropped papers off for diabetic shoes, have those been filled out

## 2024-09-01 ENCOUNTER — Ambulatory Visit (INDEPENDENT_AMBULATORY_CARE_PROVIDER_SITE_OTHER): Admitting: Dermatology

## 2024-09-01 ENCOUNTER — Encounter: Payer: Self-pay | Admitting: Dermatology

## 2024-09-01 VITALS — BP 165/76

## 2024-09-01 DIAGNOSIS — L570 Actinic keratosis: Secondary | ICD-10-CM

## 2024-09-01 DIAGNOSIS — D485 Neoplasm of uncertain behavior of skin: Secondary | ICD-10-CM | POA: Diagnosis not present

## 2024-09-01 DIAGNOSIS — W908XXA Exposure to other nonionizing radiation, initial encounter: Secondary | ICD-10-CM

## 2024-09-01 DIAGNOSIS — D099 Carcinoma in situ, unspecified: Secondary | ICD-10-CM

## 2024-09-01 DIAGNOSIS — D0461 Carcinoma in situ of skin of right upper limb, including shoulder: Secondary | ICD-10-CM | POA: Diagnosis not present

## 2024-09-01 DIAGNOSIS — L820 Inflamed seborrheic keratosis: Secondary | ICD-10-CM

## 2024-09-01 HISTORY — DX: Carcinoma in situ, unspecified: D09.9

## 2024-09-01 NOTE — Progress Notes (Signed)
 Follow-Up Visit   Subjective  Ryan Herrera is a 78 y.o. male established patient who presents for FOLLOW UP on the diagnoses listed below:  He has several spots on his hands and arms that have been present for several months, with one spot on his hand being particularly concerning. This spot has been present for at least two to three months. He describes the spots as 'real dry patches' and mentions that some have been previously frozen.  He has a history of significant sun exposure due to [place]ing across the [place] twenty years, which he believes has contributed to his skin issues. He lives in an area with a high presence of mosquitoes and ticks and occasionally gets scratched while wrestling with his dogs, which may contribute to some of the skin irritation.  No family history of cancer.  Patient was last evaluated on 10/06/23.   Spot Check: Located at the R hand that he noticed around 21mo ago. At first he thought it was a bug bite but the area never healed. The area is painful only when bumped.   The following portions of the chart were reviewed this encounter and updated as appropriate: medications, allergies, medical history  Review of Systems:  No other skin or systemic complaints except as noted in HPI or Assessment and Plan.  Objective  Well appearing patient in no apparent distress; mood and affect are within normal limits.  A focused examination was performed of the following areas:  R hand Bilateral upper extremities Relevant exam findings are noted in the Assessment and Plan.   Right Hand - Posterior 8mm hyperkeratotic papule  Right Forearm - Posterior Inflamed stuck on papule Left Forearm - Posterior, Right Forearm - Posterior (3) Erythematous thin papules/macules with gritty scale.   Assessment & Plan   NEOPLASM OF UNCERTAIN BEHAVIOR OF SKIN Right Hand - Posterior Skin / nail biopsy Type of biopsy: tangential   Informed consent: discussed  and consent obtained   Timeout: patient name, date of birth, surgical site, and procedure verified   Procedure prep:  Patient was prepped and draped in usual sterile fashion Prep type:  Isopropyl alcohol Anesthesia: the lesion was anesthetized in a standard fashion   Anesthetic:  1% lidocaine  w/ epinephrine  1-100,000 buffered w/ 8.4% NaHCO3 Instrument used: DermaBlade   Hemostasis achieved with: aluminum chloride   Outcome: patient tolerated procedure well   Post-procedure details: sterile dressing applied and wound care instructions given    Specimen 1 - Surgical pathology Differential Diagnosis: r/o NMSC vs other  Check Margins: No INFLAMED SEBORRHEIC KERATOSIS Right Forearm - Posterior Destruction of lesion - Right Forearm - Posterior Complexity: simple   Destruction method: cryotherapy   Informed consent: discussed and consent obtained   Timeout:  patient name, date of birth, surgical site, and procedure verified Lesion destroyed using liquid nitrogen: Yes   Region frozen until ice ball extended beyond lesion: Yes   Outcome: patient tolerated procedure well with no complications   Post-procedure details: wound care instructions given    AK (ACTINIC KERATOSIS) (4) Left Forearm - Posterior, Right Forearm - Posterior (3) Destruction of lesion - Left Forearm - Posterior, Right Forearm - Posterior (3) Complexity: simple   Destruction method: cryotherapy   Informed consent: discussed and consent obtained   Timeout:  patient name, date of birth, surgical site, and procedure verified Lesion destroyed using liquid nitrogen: Yes   Region frozen until ice ball extended beyond lesion: Yes   Outcome: patient tolerated procedure well  with no complications   Post-procedure details: wound care instructions given     Return for TBSE next available.   Documentation: I have reviewed the above documentation for accuracy and completeness, and I agree with the above.  I, Shirron Maranda,  CMA II, am acting as scribe for:  RUFUS CHRISTELLA HOLY, MD

## 2024-09-01 NOTE — Patient Instructions (Addendum)

## 2024-09-05 ENCOUNTER — Other Ambulatory Visit: Payer: Self-pay

## 2024-09-05 ENCOUNTER — Observation Stay (HOSPITAL_COMMUNITY)

## 2024-09-05 ENCOUNTER — Observation Stay (HOSPITAL_COMMUNITY)
Admission: EM | Admit: 2024-09-05 | Discharge: 2024-09-06 | Disposition: A | Attending: Internal Medicine | Admitting: Internal Medicine

## 2024-09-05 ENCOUNTER — Encounter (HOSPITAL_COMMUNITY): Payer: Self-pay | Admitting: Emergency Medicine

## 2024-09-05 ENCOUNTER — Emergency Department (HOSPITAL_COMMUNITY)

## 2024-09-05 DIAGNOSIS — R0602 Shortness of breath: Secondary | ICD-10-CM | POA: Diagnosis not present

## 2024-09-05 DIAGNOSIS — K529 Noninfective gastroenteritis and colitis, unspecified: Secondary | ICD-10-CM | POA: Diagnosis present

## 2024-09-05 DIAGNOSIS — I2510 Atherosclerotic heart disease of native coronary artery without angina pectoris: Secondary | ICD-10-CM | POA: Diagnosis not present

## 2024-09-05 DIAGNOSIS — I10 Essential (primary) hypertension: Secondary | ICD-10-CM | POA: Diagnosis not present

## 2024-09-05 DIAGNOSIS — I129 Hypertensive chronic kidney disease with stage 1 through stage 4 chronic kidney disease, or unspecified chronic kidney disease: Secondary | ICD-10-CM | POA: Insufficient documentation

## 2024-09-05 DIAGNOSIS — E1122 Type 2 diabetes mellitus with diabetic chronic kidney disease: Secondary | ICD-10-CM | POA: Insufficient documentation

## 2024-09-05 DIAGNOSIS — E1159 Type 2 diabetes mellitus with other circulatory complications: Secondary | ICD-10-CM | POA: Diagnosis present

## 2024-09-05 DIAGNOSIS — D631 Anemia in chronic kidney disease: Secondary | ICD-10-CM | POA: Diagnosis not present

## 2024-09-05 DIAGNOSIS — E7800 Pure hypercholesterolemia, unspecified: Secondary | ICD-10-CM | POA: Diagnosis present

## 2024-09-05 DIAGNOSIS — E782 Mixed hyperlipidemia: Secondary | ICD-10-CM | POA: Diagnosis present

## 2024-09-05 DIAGNOSIS — Z7982 Long term (current) use of aspirin: Secondary | ICD-10-CM | POA: Diagnosis not present

## 2024-09-05 DIAGNOSIS — E66811 Obesity, class 1: Secondary | ICD-10-CM | POA: Insufficient documentation

## 2024-09-05 DIAGNOSIS — Z87891 Personal history of nicotine dependence: Secondary | ICD-10-CM | POA: Insufficient documentation

## 2024-09-05 DIAGNOSIS — M545 Low back pain, unspecified: Secondary | ICD-10-CM | POA: Diagnosis not present

## 2024-09-05 DIAGNOSIS — R0789 Other chest pain: Secondary | ICD-10-CM | POA: Diagnosis present

## 2024-09-05 DIAGNOSIS — R519 Headache, unspecified: Secondary | ICD-10-CM | POA: Insufficient documentation

## 2024-09-05 DIAGNOSIS — E786 Lipoprotein deficiency: Secondary | ICD-10-CM | POA: Diagnosis present

## 2024-09-05 DIAGNOSIS — K8681 Exocrine pancreatic insufficiency: Secondary | ICD-10-CM | POA: Diagnosis not present

## 2024-09-05 DIAGNOSIS — E039 Hypothyroidism, unspecified: Secondary | ICD-10-CM | POA: Diagnosis not present

## 2024-09-05 DIAGNOSIS — R079 Chest pain, unspecified: Secondary | ICD-10-CM | POA: Diagnosis present

## 2024-09-05 DIAGNOSIS — Z6831 Body mass index (BMI) 31.0-31.9, adult: Secondary | ICD-10-CM | POA: Insufficient documentation

## 2024-09-05 DIAGNOSIS — D649 Anemia, unspecified: Secondary | ICD-10-CM | POA: Diagnosis present

## 2024-09-05 DIAGNOSIS — N401 Enlarged prostate with lower urinary tract symptoms: Secondary | ICD-10-CM | POA: Insufficient documentation

## 2024-09-05 DIAGNOSIS — I7121 Aneurysm of the ascending aorta, without rupture: Secondary | ICD-10-CM | POA: Diagnosis not present

## 2024-09-05 DIAGNOSIS — E11649 Type 2 diabetes mellitus with hypoglycemia without coma: Secondary | ICD-10-CM | POA: Diagnosis not present

## 2024-09-05 DIAGNOSIS — K760 Fatty (change of) liver, not elsewhere classified: Secondary | ICD-10-CM | POA: Diagnosis not present

## 2024-09-05 DIAGNOSIS — N400 Enlarged prostate without lower urinary tract symptoms: Secondary | ICD-10-CM | POA: Diagnosis not present

## 2024-09-05 DIAGNOSIS — G8929 Other chronic pain: Secondary | ICD-10-CM | POA: Insufficient documentation

## 2024-09-05 DIAGNOSIS — I252 Old myocardial infarction: Secondary | ICD-10-CM | POA: Insufficient documentation

## 2024-09-05 DIAGNOSIS — K219 Gastro-esophageal reflux disease without esophagitis: Secondary | ICD-10-CM | POA: Insufficient documentation

## 2024-09-05 DIAGNOSIS — E1151 Type 2 diabetes mellitus with diabetic peripheral angiopathy without gangrene: Secondary | ICD-10-CM | POA: Diagnosis not present

## 2024-09-05 DIAGNOSIS — Z794 Long term (current) use of insulin: Secondary | ICD-10-CM | POA: Diagnosis not present

## 2024-09-05 DIAGNOSIS — Z951 Presence of aortocoronary bypass graft: Secondary | ICD-10-CM | POA: Insufficient documentation

## 2024-09-05 DIAGNOSIS — Z79899 Other long term (current) drug therapy: Secondary | ICD-10-CM | POA: Insufficient documentation

## 2024-09-05 DIAGNOSIS — N182 Chronic kidney disease, stage 2 (mild): Secondary | ICD-10-CM | POA: Diagnosis present

## 2024-09-05 LAB — ECHOCARDIOGRAM COMPLETE
AR max vel: 1.7 cm2
AV Area VTI: 1.45 cm2
AV Area mean vel: 1.5 cm2
AV Mean grad: 5.9 mmHg
AV Peak grad: 9.7 mmHg
Ao pk vel: 1.56 m/s
Area-P 1/2: 3.48 cm2
Height: 66 in
S' Lateral: 3.35 cm
Weight: 3168 [oz_av]

## 2024-09-05 LAB — RETICULOCYTES
Immature Retic Fract: 16.3 % — ABNORMAL HIGH (ref 2.3–15.9)
RBC.: 3.99 MIL/uL — ABNORMAL LOW (ref 4.22–5.81)
Retic Count, Absolute: 80.2 K/uL (ref 19.0–186.0)
Retic Ct Pct: 2 % (ref 0.4–3.1)

## 2024-09-05 LAB — IRON AND TIBC
Iron: 62 ug/dL (ref 45–182)
Saturation Ratios: 24 % (ref 17.9–39.5)
TIBC: 258 ug/dL (ref 250–450)
UIBC: 196 ug/dL

## 2024-09-05 LAB — CBC WITH DIFFERENTIAL/PLATELET
Abs Immature Granulocytes: 0.05 K/uL (ref 0.00–0.07)
Basophils Absolute: 0 K/uL (ref 0.0–0.1)
Basophils Relative: 1 %
Eosinophils Absolute: 0.3 K/uL (ref 0.0–0.5)
Eosinophils Relative: 5 %
HCT: 35.4 % — ABNORMAL LOW (ref 39.0–52.0)
Hemoglobin: 12 g/dL — ABNORMAL LOW (ref 13.0–17.0)
Immature Granulocytes: 1 %
Lymphocytes Relative: 25 %
Lymphs Abs: 1.5 K/uL (ref 0.7–4.0)
MCH: 30.5 pg (ref 26.0–34.0)
MCHC: 33.9 g/dL (ref 30.0–36.0)
MCV: 90.1 fL (ref 80.0–100.0)
Monocytes Absolute: 0.5 K/uL (ref 0.1–1.0)
Monocytes Relative: 9 %
Neutro Abs: 3.4 K/uL (ref 1.7–7.7)
Neutrophils Relative %: 59 %
Platelets: 157 K/uL (ref 150–400)
RBC: 3.93 MIL/uL — ABNORMAL LOW (ref 4.22–5.81)
RDW: 14.9 % (ref 11.5–15.5)
WBC: 5.7 K/uL (ref 4.0–10.5)
nRBC: 0 % (ref 0.0–0.2)

## 2024-09-05 LAB — FERRITIN: Ferritin: 257 ng/mL (ref 24–336)

## 2024-09-05 LAB — COMPREHENSIVE METABOLIC PANEL WITH GFR
ALT: 47 U/L — ABNORMAL HIGH (ref 0–44)
AST: 31 U/L (ref 15–41)
Albumin: 3.9 g/dL (ref 3.5–5.0)
Alkaline Phosphatase: 99 U/L (ref 38–126)
Anion gap: 10 (ref 5–15)
BUN: 18 mg/dL (ref 8–23)
CO2: 25 mmol/L (ref 22–32)
Calcium: 9.1 mg/dL (ref 8.9–10.3)
Chloride: 100 mmol/L (ref 98–111)
Creatinine, Ser: 1.14 mg/dL (ref 0.61–1.24)
GFR, Estimated: >60 mL/min (ref >60)
Glucose, Bld: 304 mg/dL — ABNORMAL HIGH (ref 70–99)
Potassium: 3.9 mmol/L (ref 3.5–5.1)
Sodium: 135 mmol/L (ref 135–145)
Total Bilirubin: 0.3 mg/dL (ref 0.0–1.2)
Total Protein: 6.6 g/dL (ref 6.5–8.1)

## 2024-09-05 LAB — VITAMIN B12: Vitamin B-12: 1090 pg/mL — ABNORMAL HIGH (ref 180–914)

## 2024-09-05 LAB — LIPASE, BLOOD: Lipase: 43 U/L (ref 11–51)

## 2024-09-05 LAB — FOLATE: Folate: 12 ng/mL (ref >5.9)

## 2024-09-05 LAB — GLUCOSE, CAPILLARY
Glucose-Capillary: 231 mg/dL — ABNORMAL HIGH (ref 70–99)
Glucose-Capillary: 261 mg/dL — ABNORMAL HIGH (ref 70–99)
Glucose-Capillary: 228 mg/dL — ABNORMAL HIGH (ref 70–99)

## 2024-09-05 LAB — TROPONIN T, HIGH SENSITIVITY
Troponin T High Sensitivity: 21 ng/L — ABNORMAL HIGH (ref 0–19)
Troponin T High Sensitivity: 20 ng/L — ABNORMAL HIGH (ref 0–19)

## 2024-09-05 MED ORDER — VALACYCLOVIR HCL 500 MG PO TABS
500.0000 mg | ORAL_TABLET | Freq: Two times a day (BID) | ORAL | Status: DC
  Administered 2024-09-05 – 2024-09-06 (×2): 500 mg via ORAL
  Filled 2024-09-05 (×2): qty 1

## 2024-09-05 MED ORDER — ALLOPURINOL 100 MG PO TABS
100.0000 mg | ORAL_TABLET | Freq: Every day | ORAL | Status: DC
  Administered 2024-09-05 – 2024-09-06 (×2): 100 mg via ORAL
  Filled 2024-09-05 (×2): qty 1

## 2024-09-05 MED ORDER — INSULIN ASPART 100 UNIT/ML IJ SOLN
0.0000 [IU] | Freq: Three times a day (TID) | INTRAMUSCULAR | Status: DC
  Administered 2024-09-05: 3 [IU] via SUBCUTANEOUS
  Administered 2024-09-06: 1 [IU] via SUBCUTANEOUS
  Administered 2024-09-06: 2 [IU] via SUBCUTANEOUS
  Filled 2024-09-05 (×3): qty 1

## 2024-09-05 MED ORDER — ACETAMINOPHEN 650 MG RE SUPP: 650.0000 mg | Freq: Four times a day (QID) | RECTAL | Status: DC

## 2024-09-05 MED ORDER — ACETAMINOPHEN 325 MG PO TABS
650.0000 mg | ORAL_TABLET | Freq: Four times a day (QID) | ORAL | Status: DC
  Administered 2024-09-05 – 2024-09-06 (×5): 650 mg via ORAL
  Filled 2024-09-05 (×5): qty 2

## 2024-09-05 MED ORDER — PANCRELIPASE (LIP-PROT-AMYL) 36000-114000 UNITS PO CPEP
72000.0000 [IU] | ORAL_CAPSULE | Freq: Three times a day (TID) | ORAL | Status: DC
  Administered 2024-09-05 – 2024-09-06 (×3): 72000 [IU] via ORAL
  Filled 2024-09-05 (×3): qty 2

## 2024-09-05 MED ORDER — OXYCODONE HCL 5 MG PO TABS: 5.0000 mg | ORAL_TABLET | ORAL | Status: DC | PRN

## 2024-09-05 MED ORDER — IRBESARTAN 150 MG PO TABS
150.0000 mg | ORAL_TABLET | Freq: Every day | ORAL | Status: DC
  Administered 2024-09-05 – 2024-09-06 (×2): 150 mg via ORAL
  Filled 2024-09-05 (×2): qty 1

## 2024-09-05 MED ORDER — INSULIN ASPART 100 UNIT/ML IJ SOLN
0.0000 [IU] | Freq: Three times a day (TID) | INTRAMUSCULAR | Status: DC
  Administered 2024-09-05: 2 [IU] via SUBCUTANEOUS
  Filled 2024-09-05: qty 1

## 2024-09-05 MED ORDER — LEVOTHYROXINE SODIUM 112 MCG PO TABS
112.0000 ug | ORAL_TABLET | Freq: Every day | ORAL | Status: DC
  Administered 2024-09-06: 112 ug via ORAL
  Filled 2024-09-05: qty 1

## 2024-09-05 MED ORDER — ALUM & MAG HYDROXIDE-SIMETH 200-200-20 MG/5ML PO SUSP
30.0000 mL | Freq: Once | ORAL | Status: AC
  Administered 2024-09-05: 30 mL via ORAL
  Filled 2024-09-05: qty 30

## 2024-09-05 MED ORDER — FENTANYL CITRATE (PF) 100 MCG/2ML IJ SOLN
50.0000 ug | Freq: Once | INTRAMUSCULAR | Status: AC
  Administered 2024-09-05: 50 ug via INTRAVENOUS
  Filled 2024-09-05: qty 2

## 2024-09-05 MED ORDER — BISACODYL 5 MG PO TBEC: 5.0000 mg | DELAYED_RELEASE_TABLET | Freq: Every day | ORAL | Status: DC | PRN

## 2024-09-05 MED ORDER — INSULIN ASPART 100 UNIT/ML IJ SOLN: 3.0000 [IU] | Freq: Three times a day (TID) | INTRAMUSCULAR | Status: DC

## 2024-09-05 MED ORDER — INSULIN GLARGINE-YFGN 100 UNIT/ML ~~LOC~~ SOLN
30.0000 [IU] | Freq: Every day | SUBCUTANEOUS | Status: DC
  Filled 2024-09-05 (×2): qty 0.3

## 2024-09-05 MED ORDER — FENTANYL CITRATE (PF) 50 MCG/ML IJ SOSY: 12.5000 ug | PREFILLED_SYRINGE | INTRAMUSCULAR | Status: DC | PRN

## 2024-09-05 MED ORDER — INSULIN ASPART 100 UNIT/ML IJ SOLN
10.0000 [IU] | Freq: Three times a day (TID) | INTRAMUSCULAR | Status: DC
  Filled 2024-09-05: qty 1

## 2024-09-05 MED ORDER — FENTANYL CITRATE (PF) 100 MCG/2ML IJ SOLN: 12.5000 ug | INTRAMUSCULAR | Status: DC | PRN

## 2024-09-05 MED ORDER — EZETIMIBE 10 MG PO TABS
10.0000 mg | ORAL_TABLET | Freq: Every day | ORAL | Status: DC
  Administered 2024-09-05 – 2024-09-06 (×2): 10 mg via ORAL
  Filled 2024-09-05 (×2): qty 1

## 2024-09-05 MED ORDER — ONDANSETRON HCL 4 MG/2ML IJ SOLN: 4.0000 mg | Freq: Four times a day (QID) | INTRAMUSCULAR | Status: DC | PRN

## 2024-09-05 MED ORDER — TRAZODONE HCL 50 MG PO TABS: 25.0000 mg | ORAL_TABLET | Freq: Every evening | ORAL | Status: DC | PRN

## 2024-09-05 MED ORDER — INSULIN ASPART 100 UNIT/ML IJ SOLN: 0.0000 [IU] | Freq: Every day | INTRAMUSCULAR | Status: DC

## 2024-09-05 MED ORDER — NYSTATIN 100000 UNIT/GM EX CREA
TOPICAL_CREAM | Freq: Every day | CUTANEOUS | Status: DC
  Filled 2024-09-05: qty 15

## 2024-09-05 MED ORDER — HYDROCHLOROTHIAZIDE 25 MG PO TABS
25.0000 mg | ORAL_TABLET | Freq: Every day | ORAL | Status: DC
  Administered 2024-09-05 – 2024-09-06 (×2): 25 mg via ORAL
  Filled 2024-09-05 (×2): qty 1

## 2024-09-05 MED ORDER — HYDRALAZINE HCL 20 MG/ML IJ SOLN: 10.0000 mg | INTRAMUSCULAR | Status: DC | PRN

## 2024-09-05 MED ORDER — CARVEDILOL 3.125 MG PO TABS
6.2500 mg | ORAL_TABLET | Freq: Two times a day (BID) | ORAL | Status: DC
  Administered 2024-09-05 – 2024-09-06 (×2): 6.25 mg via ORAL
  Filled 2024-09-05 (×2): qty 2

## 2024-09-05 MED ORDER — ENOXAPARIN SODIUM 40 MG/0.4ML IJ SOSY
40.0000 mg | PREFILLED_SYRINGE | INTRAMUSCULAR | Status: DC
  Administered 2024-09-05 – 2024-09-06 (×2): 40 mg via SUBCUTANEOUS
  Filled 2024-09-05 (×2): qty 0.4

## 2024-09-05 MED ORDER — ASPIRIN 81 MG PO TBEC
81.0000 mg | DELAYED_RELEASE_TABLET | Freq: Every day | ORAL | Status: DC
  Administered 2024-09-06: 81 mg via ORAL
  Filled 2024-09-05: qty 1

## 2024-09-05 MED ORDER — PANTOPRAZOLE SODIUM 40 MG IV SOLR
40.0000 mg | Freq: Two times a day (BID) | INTRAVENOUS | Status: DC
  Administered 2024-09-05 – 2024-09-06 (×3): 40 mg via INTRAVENOUS
  Filled 2024-09-05 (×3): qty 10

## 2024-09-05 MED ORDER — ASPIRIN 81 MG PO CHEW
324.0000 mg | CHEWABLE_TABLET | Freq: Once | ORAL | Status: AC
  Administered 2024-09-05: 324 mg via ORAL
  Filled 2024-09-05: qty 4

## 2024-09-05 MED ORDER — ONDANSETRON HCL 4 MG PO TABS: 4.0000 mg | ORAL_TABLET | Freq: Four times a day (QID) | ORAL | Status: DC | PRN

## 2024-09-05 NOTE — ED Triage Notes (Signed)
 Pt here with c/o CP. States woke from sleep with what felt like an ulcer and pain didn't go away. Pt c/o pain across upper chest. States he took 2 of his wife's NTG tabs but reports pain did not go away. Pt with MI/CABG in 2023.

## 2024-09-05 NOTE — Progress Notes (Signed)
  Echocardiogram 2D Echocardiogram has been performed.  Koleen KANDICE Popper, RDCS 09/05/2024, 3:27 PM

## 2024-09-05 NOTE — Plan of Care (Signed)

## 2024-09-05 NOTE — Progress Notes (Signed)
 Pt refused the total dose of humalog  insulin  ordered for 12 noon. Pt states he only takes 4 units if his blood sugar is over 300. MD Vicci notified and stated OK to admin only the 4 units per sliding scale. Pt agreed to only take 2 units per his home sliding scale.

## 2024-09-05 NOTE — ED Provider Notes (Signed)
 Emergency Department Provider Note  TRIAGE NOTE: Pt here with c/o CP. States woke from sleep with what felt like an ulcer and pain didn't go away. Pt c/o pain across upper chest. States he took 2 of his wife's NTG tabs but reports pain did not go away. Pt with MI/CABG in 2023.   HISTORY  Chief Complaint Chest Pain   HPI Ryan Herrera is a 78 y.o. male with   a significant past medical history of coronary artery disease status post coronary artery bypass grafting (CABG) in 2008 and a re-do CABG in June 2023. He also has a history of diabetes mellitus with stage IIIa chronic kidney disease, gastroesophageal reflux disease, hypertension, hyperlipidemia, and remote cigarette smoking cessation in 1998. He presents with a chief complaint of chest pain that began at approximately 3:00 AM today. The pain initially started as a severe burning sensation at the top of the stomach and subsequently moved into the chest. The patient describes the pain as tender and diffuse, without radiation to the left arm, unlike a previous heart attack. He denies nausea, vomiting, diarrhea, constipation, fever, cough, or recent illnesses. He reports a history of high blood pressure readings, including a recent measurement of 170 at a dermatologist visit. The patient took two nitroglycerin  tablets with minimal relief. He denies any history of indigestion, heartburn, or ulcers. He is not currently on Plavix and has an allergy to NSAIDs due to kidney damage from ibuprofen. The patient takes aspirin  daily and has not missed any doses of his blood pressure medication. History was obtained from the patient.  PMH Past Medical History:  Diagnosis Date   Arthritis    hands   Coronary artery disease    a. s/p CABG in 2008 at [facility] b. re-do CABG at [facility] in 03/2022 with left RA-RI and SVG-PDA   Diabetes mellitus without complication ([name])    GERD (gastroesophageal reflux disease)    H/O poliomyelitis    age 24.   now has weak stomach muscles   Hypertension    IBS (irritable bowel syndrome)    Iron deficiency anemia 09/22/2018   Myocardial infarct [facility])    Renal insufficiency    Thyroid  disease     Home Medications Prior to Admission medications   Medication Sig Start Date End Date Taking? Authorizing Provider  acetaminophen  (TYLENOL ) 650 MG CR tablet Take 1,300 mg by mouth every 8 (eight) hours as needed for pain.    [provider]  allopurinol  (ZYLOPRIM ) 100 MG tablet Take 1 tablet (100 mg total) by mouth daily. 10/22/21   Paseda, Folashade R, FNP  aspirin  EC 81 MG tablet Take 1 tablet (81 mg total) by mouth daily. 06/30/23   Ricky Fines, MD  Blood Glucose Monitoring Suppl (ACCU-CHEK GUIDE ME) w/Device KIT 1 Piece by Does not apply route as directed. 09/29/23   Nida, Gebreselassie W, MD  Carboxymethylcellulose Sodium (THERATEARS PF OP) Place 1 drop into both eyes daily as needed (dry eyes).    [provider]  carvedilol  (COREG ) 6.25 MG tablet TAKE 1 TABLET BY MOUTH TWICE A DAY 10/02/23   Alvan Ronal BRAVO, MD  Cholecalciferol (D3 2000) 50 MCG (2000 UT) CAPS Take 1 capsule by mouth daily.    [provider]  cyanocobalamin  (VITAMIN B12) 1000 MCG tablet Take 1,000 mcg by mouth daily.    [provider]  diclofenac  Sodium (VOLTAREN ) 1 % GEL Apply 4 g topically 4 (four) times daily. 08/12/24   Silva Juliene SAUNDERS,  DPM  Evolocumab  (REPATHA  SURECLICK) 140 MG/ML SOAJ INJECT 140 MG INTO THE SKIN EVERY 14 (FOURTEEN) DAYS. 02/23/24   Duke, Jon Garre, PA  ezetimibe  (ZETIA ) 10 MG tablet Take 1 tablet (10 mg total) by mouth daily. 05/24/24   Ladona Heinz, MD  fluconazole  (DIFLUCAN ) 200 MG tablet Take 1 tablet (200 mg total) by mouth once a week. 10/15/23   Paci, Karina M, MD  fluocinonide  (LIDEX ) 0.05 % external solution Apply 1 Application topically 2 (two) times daily as needed. To scalp 10/09/23   Paci, Karina M, MD  fluocinonide  (LIDEX ) 0.05 % external solution Apply 1  Application topically daily. 10/09/23   [provider]  furosemide  (LASIX ) 20 MG tablet Take 1 tablet (20 mg total) by mouth as needed (FOR WEIGHT GAIN > 2-3POUNDS OVERNIGHT OR>5 POUNDS IN A WEEK). 11/04/23 09/01/24  Emelia Josefa HERO, NP  glipiZIDE  (GLUCOTROL  XL) 5 MG 24 hr tablet Take 1 tablet (5 mg total) by mouth 2 (two) times daily. 06/01/24   Nida, Gebreselassie W, MD  glucose blood (ACCU-CHEK GUIDE TEST) test strip Use to monitor glucose 4  times daily as instructed 07/06/24   Nida, Gebreselassie W, MD  hydrochlorothiazide  (HYDRODIURIL ) 25 MG tablet Take 1 tablet (25 mg total) by mouth daily. 05/27/24   Ladona Heinz, MD  insulin  degludec (TRESIBA  FLEXTOUCH) 100 UNIT/ML FlexTouch Pen INJECT 40 UNITS INTO THE SKIN AT BEDTIME. 08/05/24   Nida, Gebreselassie W, MD  insulin  lispro (HUMALOG  KWIKPEN) 100 UNIT/ML KwikPen Inject 6-12 Units into the skin 3 (three) times daily. 07/06/24   Nida, Gebreselassie W, MD  Insulin  Pen Needle (BD PEN NEEDLE NANO U/F) 32G X 4 MM MISC 1 each by Does not apply route 4 (four) times daily. 07/06/24   Nida, Gebreselassie W, MD  ipratropium (ATROVENT ) 0.03 % nasal spray Place 2 sprays into both nostrils every 12 (twelve) hours. 11/07/23   Del Wilhelmena Lloyd Sola, FNP  ketoconazole  (NIZORAL ) 2 % shampoo Apply 1 Application topically 2 (two) times a week. 03/22/24   Melvenia Manus BRAVO, MD  levothyroxine  (SYNTHROID ) 112 MCG tablet Take 1 tablet (112 mcg total) by mouth daily. 03/23/24   Melvenia Manus BRAVO, MD  metFORMIN  (GLUCOPHAGE -XR) 500 MG 24 hr tablet TAKE 1 TABLET BY MOUTH EVERY DAY WITH BREAKFAST 06/15/24   Nida, Gebreselassie W, MD  mupirocin  cream (BACTROBAN ) 2 % APPLY 1 APPLICATION TOPICALLY 2 (TWO) TIMES DAILY. APPLY TO GROIN 11/30/23   Paci, Karina M, MD  nystatin  cream (MYCOSTATIN ) APPLY TO THE GROIN AREA ONCE DAILY 03/22/24   Dixon, Phillip E, MD  olmesartan  (BENICAR ) 20 MG tablet Take 1 tablet (20 mg total) by mouth daily. 01/19/24   Duke, Jon Garre, PA  pantoprazole   (PROTONIX ) 40 MG tablet TAKE 1 TABLET BY MOUTH TWICE A DAY 06/16/24   Bevely Doffing, FNP  tadalafil (CIALIS) 5 MG tablet Take 5 mg by mouth daily.    [provider]  triamcinolone  cream (KENALOG ) 0.1 % Apply 1 Application topically 2 (two) times daily as needed. For feet 10/06/23   Paci, Karina M, MD  valACYclovir  (VALTREX ) 500 MG tablet TAKE 1 TABLET BY MOUTH TWICE A DAY 08/23/24   Bevely Doffing, FNP    Social History Social History   Tobacco Use   Smoking status: Former    Current packs/day: 0.00    Average packs/day: 1 pack/day for 38.0 years (38.0 ttl pk-yrs)    Types: Cigarettes    Start date: 12/1958    Quit date: 12/1996  Years since quitting: 27.7   Smokeless tobacco: Never  Vaping Use   Vaping status: Never Used  Substance Use Topics   Alcohol use: Yes    Comment: occassionally   Drug use: No    Review of Systems: Documented in HPI ____________________________________________  PHYSICAL EXAM: VITAL SIGNS: Triage: Blood pressure (!) 170/72, pulse (!) 57, temperature 97.6 F (36.4 C), temperature source Oral, resp. rate 20, height 5' 6 (1.676 m), weight 89.8 kg, SpO2 98%.  Vitals:   09/05/24 0625 09/05/24 0627  BP:  (!) 170/72  Pulse:  (!) 57  Resp:  20  Temp:  97.6 F (36.4 C)  TempSrc:  Oral  SpO2:  98%  Weight: 89.8 kg   Height: 5' 6 (1.676 m)     Physical Exam Vitals and nursing note reviewed.  Constitutional:      Appearance: He is well-developed.  HENT:     Head: Normocephalic and atraumatic.  Cardiovascular:     Rate and Rhythm: Normal rate.  Pulmonary:     Effort: Pulmonary effort is normal. No respiratory distress.     Breath sounds: No decreased breath sounds or wheezing.  Abdominal:     General: There is no distension.     Tenderness: There is abdominal tenderness. There is no guarding or rebound.  Musculoskeletal:        General: Normal range of motion.     Cervical back: Normal range of motion.  Neurological:     Mental  Status: He is alert.       ____________________________________________   LABS (all labs ordered are listed, but only abnormal results are displayed)  Labs Reviewed  CBC WITH DIFFERENTIAL/PLATELET  COMPREHENSIVE METABOLIC PANEL WITH GFR  LIPASE, BLOOD  TROPONIN T, HIGH SENSITIVITY   ____________________________________________  EKG   EKG Interpretation Date/Time:    Ventricular Rate:    PR Interval:    QRS Duration:    QT Interval:    QTC Calculation:   R Axis:      Text Interpretation:          ____________________________________________  RADIOLOGY  No results found. ____________________________________________  PROCEDURES  Procedure(s) performed:   Procedures ____________________________________________  INITIAL IMPRESSION / ASSESSMENT AND PLAN   Clinical Course as of 09/05/24 0649  Sun Sep 05, 2024  9350 Initial Evaluation:  Patient presents with chest pain that began as a burning sensation in the upper stomach and moved to the chest.  The differential diagnosis for this presentation includes acute coronary syndrome (ACS), gastroesophageal reflux disease (GERD), peptic ulcer disease, esophageal spasm, and musculoskeletal pain. Given the patient's cardiac history, ACS is a primary concern. GERD is also considered due to the burning sensation and the patient's history of GERD. Peptic ulcer disease and esophageal spasm are less likely but considered due to the nature of the pain. Musculoskeletal pain is less likely given the patient's description and history.  Plan:  Check troponin levels EKG is ordered; shows slightly low heart rate but is otherwise unremarkable Perform chest X-ray Administer Maalox and assess for symptom relief Provide fentanyl  for pain management if Maalox is ineffective Administer full dose of aspirin  despite NSAID allergy due to kidney damage, as benefits outweigh risks [JM]    Clinical Course User Index [JM] Birtie Fellman, Selinda, MD      Images ordered viewed and obtained by myself. Agree with Radiology interpretation. Details in ED course.  Labs ordered reviewed by myself as detailed in ED course.  Consultations obtained/considered detailed in ED course.  Care transferred pending completion of workup, reevaluation and disposition.    Lorette Mayo, MD 09/06/24 [phone]

## 2024-09-05 NOTE — Hospital Course (Addendum)
 78 year old male with history of CAD that is post CABG x 2 1 in 2008 and redo CABG in 2023, type 2 diabetes mellitus, insulin  requiring with renal complications, stage IIIa CKD, GERD, hypertension, hyperlipidemia, former smoker, pancreatic insufficiency, IBS, hypothyroidism, history of poliomyelitis who presented to the ED early this morning with sensation and complaint of severe chest pain that started at around 3 AM.  It woke him up from sleeping.  He says it felt like an ulcer and the pain didn't go away.  He describes it as a burning sensation in the stomach that subsequently moved into the chest.  He does not have radiation of pain into the arms.  He has no nausea vomiting fever chills.  He reports that he has been eating a lot more in the last few days for Thanksgiving.  He said he had a second Thanksgiving meal yesterday.  His blood sugars are elevated at 300 today.  He also has elevated blood pressure readings.  He says he takes a daily 81 mg aspirin .  He also reported that he took 2 nitroglycerin  tablets with no relief in symptoms.  He also reports that his GI doctor has him taking Creon  for pancreatic insufficiency.    His ED workup included an EKG with no acute findings, high-sensitivity troponin tests have been flat at 20, blood pressures elevated 170/72, pulse ox 98% on room air, heart rate 57, blood glucose 304.  He was given Maalox, aspirin , fentanyl  in the ED and now feeling much better.  Observation admission was requested so that he can be seen by cardiology tomorrow given his high CAD risk history.

## 2024-09-05 NOTE — ED Notes (Signed)
 Transport aware to take pt .

## 2024-09-05 NOTE — Progress Notes (Signed)
 Pt arrived to room 322 via stretcher from ED. Pt ambulatory from stretcher to bed without assistance, steady gait. Oriented to room and  safety procedures, states understanding. Call bell within reach.

## 2024-09-05 NOTE — H&P (Addendum)
 History and Physical  [facility]  OBINNA EHRESMAN FMW:[id number] DOB: 1946-06-25 DOA: 09/05/2024  PCP: Bevely Doffing, FNP  Patient coming from: Home  Level of care: Telemetry  I have personally briefly reviewed patient's old medical records in [facility]  Chief Complaint: chest pain  HPI: Ryan Herrera is a 78 year old male with history of CAD that is post CABG x 2 1 in 2008 and redo CABG in 2023, type 2 diabetes mellitus, insulin  requiring with renal complications, stage IIIa CKD, GERD, hypertension, hyperlipidemia, former smoker, pancreatic insufficiency, IBS, hypothyroidism, history of poliomyelitis who presented to the ED early this morning with sensation and complaint of severe chest pain that started at around 3 AM.  It woke him up from sleeping.  He says it felt like an ulcer and the pain didn't go away.  He describes it as a burning sensation in the stomach that subsequently moved into the chest.  He does not have radiation of pain into the arms.  He has no nausea vomiting fever chills.  He reports that he has been eating a lot more in the last few days for Thanksgiving.  He said he had a second Thanksgiving meal yesterday.  His blood sugars are elevated at 300 today.  He also has elevated blood pressure readings.  He says he takes a daily 81 mg aspirin .  He also reported that he took 2 nitroglycerin  tablets with no relief in symptoms.  He also reports that his GI doctor has him taking Creon for pancreatic insufficiency.    His ED workup included an EKG with no acute findings, high-sensitivity troponin tests have been flat at 20, blood pressures elevated 170/72, pulse ox 98% on room air, heart rate 57, blood glucose 304.  He was given Maalox, aspirin , fentanyl  in the ED and now feeling much better.  Observation admission was requested so that he can be seen by cardiology tomorrow given his high CAD risk history.    Past Medical History:  Diagnosis Date    Arthritis    hands   Coronary artery disease    a. s/p CABG in 2008 at [facility] b. re-do CABG at [facility] in 03/2022 with left RA-RI and SVG-PDA   Diabetes mellitus without complication ([name])    GERD (gastroesophageal reflux disease)    H/O poliomyelitis    age 10.  now has weak stomach muscles   Hypertension    IBS (irritable bowel syndrome)    Iron deficiency anemia 09/22/2018   Myocardial infarct [facility])    Renal insufficiency    Thyroid  disease     Past Surgical History:  Procedure Laterality Date   BACK SURGERY  1967   BACK SURGERY  10/04/2005   benign tumor removal  2004   BIOPSY  11/18/2022   Procedure: BIOPSY;  Surgeon: Cindie Carlin POUR, DO;  Location: [facility];  Service: Endoscopy;;   COLONOSCOPY WITH PROPOFOL  N/A 11/18/2022   Procedure: COLONOSCOPY WITH PROPOFOL ;  Surgeon: Cindie Carlin POUR, DO;  Location: [facility];  Service: Endoscopy;  Laterality: N/A;  8:30 am, pt did not want early appt. moved down (per PAT)   CORONARY ARTERY BYPASS GRAFT  2008   [facility] - 3 vessel   CORONARY ARTERY BYPASS GRAFT N/A 03/28/2022   Procedure: REDO CORONARY ARTERY BYPASS GRAFTING (CABG) X2 BYPASSES USING OPEN LEFT RADIAL ARTERY AND ENDOSCOPIC RIGHT GREATER SAPHENOUS VEIN HARVEST;  Surgeon: Kerrin Elspeth BROCKS, MD;  Location: [facility] OR;  Service: Open Heart Surgery;  Laterality: N/A;   CYSTOSCOPY WITH INSERTION OF UROLIFT     ESOPHAGOGASTRODUODENOSCOPY (EGD) WITH PROPOFOL  N/A 11/18/2022   Procedure: ESOPHAGOGASTRODUODENOSCOPY (EGD) WITH PROPOFOL ;  Surgeon: Cindie Carlin POUR, DO;  Location: [facility];  Service: Endoscopy;  Laterality: N/A;   HERNIA REPAIR Bilateral    inguinal   HYDROCELE EXCISION / REPAIR     JOINT REPLACEMENT     left knee x2, rright x1   LEFT HEART CATH AND CORS/GRAFTS ANGIOGRAPHY N/A 03/25/2022   Procedure: LEFT HEART CATH AND CORS/GRAFTS ANGIOGRAPHY;  Surgeon: Dann Candyce RAMAN, MD;  Location: [facility] INVASIVE CV LAB;  Service: Cardiovascular;  Laterality: N/A;    POLYPECTOMY  11/18/2022   Procedure: POLYPECTOMY;  Surgeon: Cindie Carlin POUR, DO;  Location: [facility];  Service: Endoscopy;;   PROSTATE SURGERY N/A 01/2021   TEE WITHOUT CARDIOVERSION N/A 03/28/2022   Procedure: TRANSESOPHAGEAL ECHOCARDIOGRAM (TEE);  Surgeon: Kerrin Elspeth BROCKS, MD;  Location: [facility] OR;  Service: Open Heart Surgery;  Laterality: N/A;   TONSILLECTOMY       reports that he quit smoking about 27 years ago. His smoking use included cigarettes. He started smoking about 65 years ago. He has a 38 pack-year smoking history. He has never used smokeless tobacco. He reports current alcohol use. He reports that he does not use drugs.  Allergies  Allergen Reactions   Myrbetriq [Mirabegron] Nausea And Vomiting   2,4-D Dimethylamine Other (See Comments)   Jardiance  [Empagliflozin ]     Pt does not like this medication   Niacin And Related Itching   Nsaids Other (See Comments)    Kidney damage  Non-steroidal anti-inflammatory agent (product)   Other Diarrhea and Nausea And Vomiting    PT does not want OPIOIDS   GLP-1 drugs - Nausea, vomiting, diarrhea, belching   Statins Itching    Family History  Problem Relation Age of Onset   Hypertension Mother    Heart attack Mother    Osteoarthritis Mother    Stroke Father    Hypertension Father    Stroke Sister    Stroke Sister    Colon cancer Neg Hx    Prostate cancer Neg Hx    Cancer - Lung Neg Hx     Prior to Admission medications   Medication Sig Start Date End Date Taking? Authorizing Provider  acetaminophen  (TYLENOL ) 650 MG CR tablet Take 1,300 mg by mouth every 8 (eight) hours as needed for pain.    [provider]  allopurinol  (ZYLOPRIM ) 100 MG tablet Take 1 tablet (100 mg total) by mouth daily. 10/22/21   Paseda, Folashade R, FNP  aspirin  EC 81 MG tablet Take 1 tablet (81 mg total) by mouth daily. 06/30/23   Ricky Fines, MD  Blood Glucose Monitoring Suppl (ACCU-CHEK GUIDE ME) w/Device KIT 1 Piece by Does  not apply route as directed. 09/29/23   Nida, Gebreselassie W, MD  Carboxymethylcellulose Sodium (THERATEARS PF OP) Place 1 drop into both eyes daily as needed (dry eyes).    [provider]  carvedilol  (COREG ) 6.25 MG tablet TAKE 1 TABLET BY MOUTH TWICE A DAY 10/02/23   Alvan Ronal BRAVO, MD  Cholecalciferol (D3 2000) 50 MCG (2000 UT) CAPS Take 1 capsule by mouth daily.    [provider]  cyanocobalamin  (VITAMIN B12) 1000 MCG tablet Take 1,000 mcg by mouth daily.    [provider]  diclofenac  Sodium (VOLTAREN ) 1 % GEL Apply 4 g topically 4 (four) times daily. 08/12/24   Silva Juliene SAUNDERS, DPM  Evolocumab  (REPATHA  SURECLICK) 140 MG/ML SOAJ INJECT 140 MG INTO THE SKIN EVERY 14 (FOURTEEN) DAYS. 02/23/24   [facility], Jon Garre, PA  ezetimibe  (ZETIA ) 10 MG tablet Take 1 tablet (10 mg total) by mouth daily. 05/24/24   Ladona Heinz, MD  fluconazole  (DIFLUCAN ) 200 MG tablet Take 1 tablet (200 mg total) by mouth once a week. 10/15/23   Paci, Karina M, MD  fluocinonide  (LIDEX ) 0.05 % external solution Apply 1 Application topically 2 (two) times daily as needed. To scalp 10/09/23   Paci, Karina M, MD  fluocinonide  (LIDEX ) 0.05 % external solution Apply 1 Application topically daily. 10/09/23   [provider]  furosemide  (LASIX ) 20 MG tablet Take 1 tablet (20 mg total) by mouth as needed (FOR WEIGHT GAIN > 2-3POUNDS OVERNIGHT OR>5 POUNDS IN A WEEK). 11/04/23 09/01/24  Emelia Josefa HERO, NP  glipiZIDE  (GLUCOTROL  XL) 5 MG 24 hr tablet Take 1 tablet (5 mg total) by mouth 2 (two) times daily. 06/01/24   Nida, Gebreselassie W, MD  glucose blood (ACCU-CHEK GUIDE TEST) test strip Use to monitor glucose 4  times daily as instructed 07/06/24   Nida, Gebreselassie W, MD  hydrochlorothiazide  (HYDRODIURIL ) 25 MG tablet Take 1 tablet (25 mg total) by mouth daily. 05/27/24   Ladona Heinz, MD  insulin  degludec (TRESIBA  FLEXTOUCH) 100 UNIT/ML FlexTouch Pen INJECT 40 UNITS INTO THE SKIN AT BEDTIME. 08/05/24    Nida, Gebreselassie W, MD  insulin  lispro (HUMALOG  KWIKPEN) 100 UNIT/ML KwikPen Inject 6-12 Units into the skin 3 (three) times daily. 07/06/24   Nida, Gebreselassie W, MD  Insulin  Pen Needle (BD PEN NEEDLE NANO U/F) 32G X 4 MM MISC 1 each by Does not apply route 4 (four) times daily. 07/06/24   Nida, Gebreselassie W, MD  ipratropium (ATROVENT ) 0.03 % nasal spray Place 2 sprays into both nostrils every 12 (twelve) hours. 11/07/23   Del Wilhelmena Lloyd Sola, FNP  ketoconazole  (NIZORAL ) 2 % shampoo Apply 1 Application topically 2 (two) times a week. 03/22/24   Melvenia Manus BRAVO, MD  levothyroxine  (SYNTHROID ) 112 MCG tablet Take 1 tablet (112 mcg total) by mouth daily. 03/23/24   Melvenia Manus BRAVO, MD  metFORMIN  (GLUCOPHAGE -XR) 500 MG 24 hr tablet TAKE 1 TABLET BY MOUTH EVERY DAY WITH BREAKFAST 06/15/24   Nida, Gebreselassie W, MD  mupirocin  cream (BACTROBAN ) 2 % APPLY 1 APPLICATION TOPICALLY 2 (TWO) TIMES DAILY. APPLY TO GROIN 11/30/23   Paci, Karina M, MD  nystatin  cream (MYCOSTATIN ) APPLY TO THE GROIN AREA ONCE DAILY 03/22/24   Dixon, Phillip E, MD  olmesartan  (BENICAR ) 20 MG tablet Take 1 tablet (20 mg total) by mouth daily. 01/19/24   [facility], Jon Garre, PA  pantoprazole  (PROTONIX ) 40 MG tablet TAKE 1 TABLET BY MOUTH TWICE A DAY 06/16/24   Bevely Doffing, FNP  tadalafil (CIALIS) 5 MG tablet Take 5 mg by mouth daily.    [provider]  triamcinolone  cream (KENALOG ) 0.1 % Apply 1 Application topically 2 (two) times daily as needed. For feet 10/06/23   Corey Rufus HERO, MD  valACYclovir  (VALTREX ) 500 MG tablet TAKE 1 TABLET BY MOUTH TWICE A DAY 08/23/24   Bevely Doffing, FNP    Physical Exam: Vitals:   09/05/24 0712 09/05/24 0716 09/05/24 0730 09/05/24 0930  BP:  (!) 163/73 (!) 146/72 (!) 155/72  Pulse:  (!) 56 (!) 58 (!) 57  Resp:    18  Temp:    97.6 F (36.4 C)  TempSrc:      SpO2: 95%  97% 94% 98%  Weight:      Height:       Constitutional: NAD, calm, comfortable Eyes: PERRL, lids and  conjunctivae normal ENMT: Mucous membranes are moist. Posterior pharynx clear of any exudate or lesions.Normal dentition.  Neck: normal, supple, no masses, no thyromegaly Respiratory: clear to auscultation bilaterally, no wheezing, no crackles. Normal respiratory effort. No accessory muscle use.  Cardiovascular: normal s1, s2 sounds, no murmurs / rubs / gallops. No extremity edema. 2+ pedal pulses. No carotid bruits.  Abdomen: no tenderness, no masses palpated. No hepatosplenomegaly. Bowel sounds positive.  Musculoskeletal: no clubbing / cyanosis. No joint deformity upper and lower extremities. Good ROM, no contractures. Normal muscle tone.  Skin: no rashes, lesions, ulcers. No induration Neurologic: CN 2-12 grossly intact. Sensation intact, DTR normal. Strength 5/5 in all 4.  Psychiatric: Normal judgment and insight. Alert and oriented x 3. Normal mood.   Labs on Admission: I have personally reviewed following labs and imaging studies  CBC: Recent Labs  Lab 09/05/24 0630  WBC 5.7  NEUTROABS 3.4  HGB 12.0*  HCT 35.4*  MCV 90.1  PLT 157   Basic Metabolic Panel: Recent Labs  Lab 09/05/24 0630  NA 135  K 3.9  CL 100  CO2 25  GLUCOSE 304*  BUN 18  CREATININE 1.14  CALCIUM  9.1   GFR: Estimated Creatinine Clearance: 54.2 mL/min (by C-G formula based on SCr of 1.14 mg/dL). Liver Function Tests: Recent Labs  Lab 09/05/24 0630  AST 31  ALT 47*  ALKPHOS 99  BILITOT 0.3  PROT 6.6  ALBUMIN  3.9   Recent Labs  Lab 09/05/24 0630  LIPASE 43   No results for input(s): AMMONIA in the last 168 hours. Coagulation Profile: No results for input(s): INR, PROTIME in the last 168 hours. Cardiac Enzymes: No results for input(s): CKTOTAL, CKMB, CKMBINDEX, TROPONINI in the last 168 hours. BNP (last 3 results) No results for input(s): PROBNP in the last 8760 hours. HbA1C: No results for input(s): HGBA1C in the last 72 hours. CBG: No results for input(s): GLUCAP  in the last 168 hours. Lipid Profile: No results for input(s): CHOL, HDL, LDLCALC, TRIG, CHOLHDL, LDLDIRECT in the last 72 hours. Thyroid  Function Tests: No results for input(s): TSH, T4TOTAL, FREET4, T3FREE, THYROIDAB in the last 72 hours. Anemia Panel: No results for input(s): VITAMINB12, FOLATE, FERRITIN, TIBC, IRON, RETICCTPCT in the last 72 hours. Urine analysis:    Component Value Date/Time   COLORURINE YELLOW 06/11/2022 1840   APPEARANCEUR CLEAR 06/11/2022 1840   APPEARANCEUR CLEAR 04/29/2014 1442   LABSPEC 1.024 06/11/2022 1840   LABSPEC 1.015 04/29/2014 1442   PHURINE 5.0 06/11/2022 1840   GLUCOSEU >=500 (A) 06/11/2022 1840   GLUCOSEU NEGATIVE 04/29/2014 1442   HGBUR NEGATIVE 06/11/2022 1840   BILIRUBINUR NEGATIVE 06/11/2022 1840   BILIRUBINUR NEGATIVE 04/29/2014 1442   KETONESUR NEGATIVE 06/11/2022 1840   PROTEINUR NEGATIVE 06/11/2022 1840   NITRITE NEGATIVE 06/11/2022 1840   LEUKOCYTESUR NEGATIVE 06/11/2022 1840   LEUKOCYTESUR TRACE 04/29/2014 1442    Radiological Exams on Admission: DG Chest Port 1 View Result Date: 09/05/2024 CLINICAL DATA:  Acute chest pain.  Coronary artery disease. EXAM: PORTABLE CHEST 1 VIEW COMPARISON:  11/10/2023 FINDINGS: Lordotic positioning noted. Prior CABG. The heart size and mediastinal contours are within normal limits. Both lungs are clear. The visualized skeletal structures are unremarkable. IMPRESSION: No active disease. Electronically Signed   By: Norleen DELENA Kil M.D.   On: 09/05/2024 06:59   EKG: Independently reviewed. SR,  no acute findings   Assessment/Plan Principal Problem:   Chest pain Active Problems:   BPH (benign prostatic hyperplasia)   CAD (coronary artery disease), native coronary artery   Chronic low back pain   DM type 2 causing vascular disease ([name])   Essential hypertension   Class 1 obesity due to excess calories with serious comorbidity and body mass index (BMI) of 31.0 to 31.9  in adult   Hypothyroidism   Uncontrolled type 2 diabetes mellitus with hypoglycemia, with long-term current use of insulin  ([name])   Chronic diarrhea   GERD (gastroesophageal reflux disease)   CKD (chronic kidney disease), stage II   Mixed hyperlipidemia   Pure hypercholesterolemia   S/P CABG x 2   Low HDL (under 40)   Long-term insulin  use ([name])   Thoracic ascending aortic aneurysm   Nonalcoholic fatty liver disease   Normocytic anemia    Chest pain, atypical  -- seems more like GI related symptoms from over eating and indigestion -- given his history of CAD agreed to observe in hospital and have him cleared by cardio -- agree with maalox treatment -- added IV pantoprazole  40 mg BID -- resume his home Creon for pancreatic insufficiency -- His HS troponin tests have been reassuring and I do not suspect ACS at this time -- monitor on telemetry -- TTE  -- plan to resume his home cardiac medications when reconciled by pharmacy  GERD -- pantoprazole  ordered for GI protection  -- BID dosing today given current symptoms and presentation   Uncontrolled Type 2 DM with vascular complications and hyperglycemia -- he presents with BS > 300 likely from meal eaten last night -- resume basal insulin  glargine for now 40 units daily (tresiba  not available at this hospital)  -- add prandial insulin  coverage for meals eaten>50% : novolog  10 units TID with meals -- monitor CBG 5 times daily  -- A1c is pending  Hyperlipidemia -- resume home statin therapy when home meds reconciled  Normocytic anemia -- stable Hg -- anemia panel pending  CKD stage II -- stable   Hypothyroidism -- resume home levothyroxine   Hypertension -- suboptimally controlled -- secondary to diet, recent consumption of Thanksgiving meal yesterday -- pt says his BPs normally are much better controlled --  he can confirm he takes HCT 25 mg daily and benicar  20 mg daily so will restart -- waiting on home meds to be  reconciled by pharmacy  Pancreatic insufficiency -- would like to resume home creon when we can get his home meds reconciled   DVT prophylaxis: enoxaparin    Code Status: full   Family Communication: wife at bedside   Disposition Plan: home   Consults called: cardiology to see on 12/1   Admission status: OBV Time spent: 58 mins   Level of care: Telemetry Afton Louder MD [facility] How to contact the [facility] Attending or Consulting provider 7A - 7P or covering provider during after hours 7P -7A, for this patient?  Check the care team in [facility] and look for a) attending/consulting [facility] provider listed and b) the [facility] team listed Log into [email or web address] and use [address]'s universal password to access. If you do not have the password, please contact the hospital operator. Locate the [facility] provider you are looking for under [facility] and page to a number that you can be directly reached. If you still have difficulty reaching the provider, please page the [facility] (Director on Call) for the Hospitalists listed on amion for assistance.  If 7PM-7AM, please contact night-coverage [email or web address] Password [email or web address]  09/05/2024, 9:48 AM

## 2024-09-06 ENCOUNTER — Observation Stay (HOSPITAL_COMMUNITY)

## 2024-09-06 ENCOUNTER — Ambulatory Visit: Payer: Self-pay | Admitting: Dermatology

## 2024-09-06 DIAGNOSIS — N183 Chronic kidney disease, stage 3 unspecified: Secondary | ICD-10-CM | POA: Diagnosis not present

## 2024-09-06 DIAGNOSIS — G8929 Other chronic pain: Secondary | ICD-10-CM

## 2024-09-06 DIAGNOSIS — N400 Enlarged prostate without lower urinary tract symptoms: Secondary | ICD-10-CM

## 2024-09-06 DIAGNOSIS — I10 Essential (primary) hypertension: Secondary | ICD-10-CM

## 2024-09-06 DIAGNOSIS — E785 Hyperlipidemia, unspecified: Secondary | ICD-10-CM | POA: Diagnosis not present

## 2024-09-06 DIAGNOSIS — E66811 Obesity, class 1: Secondary | ICD-10-CM | POA: Diagnosis not present

## 2024-09-06 DIAGNOSIS — E6609 Other obesity due to excess calories: Secondary | ICD-10-CM

## 2024-09-06 DIAGNOSIS — I25119 Atherosclerotic heart disease of native coronary artery with unspecified angina pectoris: Secondary | ICD-10-CM | POA: Diagnosis not present

## 2024-09-06 DIAGNOSIS — R079 Chest pain, unspecified: Secondary | ICD-10-CM | POA: Diagnosis not present

## 2024-09-06 DIAGNOSIS — E7800 Pure hypercholesterolemia, unspecified: Secondary | ICD-10-CM

## 2024-09-06 DIAGNOSIS — E782 Mixed hyperlipidemia: Secondary | ICD-10-CM

## 2024-09-06 DIAGNOSIS — E1159 Type 2 diabetes mellitus with other circulatory complications: Secondary | ICD-10-CM

## 2024-09-06 DIAGNOSIS — D649 Anemia, unspecified: Secondary | ICD-10-CM

## 2024-09-06 DIAGNOSIS — Z6831 Body mass index (BMI) 31.0-31.9, adult: Secondary | ICD-10-CM

## 2024-09-06 DIAGNOSIS — R0789 Other chest pain: Secondary | ICD-10-CM | POA: Diagnosis not present

## 2024-09-06 DIAGNOSIS — E786 Lipoprotein deficiency: Secondary | ICD-10-CM

## 2024-09-06 DIAGNOSIS — M545 Low back pain, unspecified: Secondary | ICD-10-CM

## 2024-09-06 LAB — BASIC METABOLIC PANEL WITH GFR
Anion gap: 11 (ref 5–15)
BUN: 16 mg/dL (ref 8–23)
CO2: 25 mmol/L (ref 22–32)
Calcium: 9.1 mg/dL (ref 8.9–10.3)
Chloride: 100 mmol/L (ref 98–111)
Creatinine, Ser: 1.12 mg/dL (ref 0.61–1.24)
GFR, Estimated: >60 mL/min (ref >60)
Glucose, Bld: 197 mg/dL — ABNORMAL HIGH (ref 70–99)
Potassium: 4.2 mmol/L (ref 3.5–5.1)
Sodium: 135 mmol/L (ref 135–145)

## 2024-09-06 LAB — LIPID PANEL
Cholesterol: 84 mg/dL (ref 0–200)
HDL: 37 mg/dL — ABNORMAL LOW (ref >40)
LDL Cholesterol: 23 mg/dL (ref 0–99)
Total CHOL/HDL Ratio: 2.3 ratio
Triglycerides: 122 mg/dL (ref <150)
VLDL: 24 mg/dL (ref 0–40)

## 2024-09-06 LAB — MAGNESIUM: Magnesium: 2.1 mg/dL (ref 1.7–2.4)

## 2024-09-06 LAB — CBC
HCT: 37 % — ABNORMAL LOW (ref 39.0–52.0)
Hemoglobin: 12.4 g/dL — ABNORMAL LOW (ref 13.0–17.0)
MCH: 30.5 pg (ref 26.0–34.0)
MCHC: 33.5 g/dL (ref 30.0–36.0)
MCV: 90.9 fL (ref 80.0–100.0)
Platelets: 145 K/uL — ABNORMAL LOW (ref 150–400)
RBC: 4.07 MIL/uL — ABNORMAL LOW (ref 4.22–5.81)
RDW: 15.1 % (ref 11.5–15.5)
WBC: 6 K/uL (ref 4.0–10.5)
nRBC: 0 % (ref 0.0–0.2)

## 2024-09-06 LAB — HEMOGLOBIN A1C
Hgb A1c MFr Bld: 10 % — ABNORMAL HIGH (ref 4.8–5.6)
Mean Plasma Glucose: 240 mg/dL

## 2024-09-06 LAB — GLUCOSE, CAPILLARY
Glucose-Capillary: 196 mg/dL — ABNORMAL HIGH (ref 70–99)
Glucose-Capillary: 149 mg/dL — ABNORMAL HIGH (ref 70–99)
Glucose-Capillary: 175 mg/dL — ABNORMAL HIGH (ref 70–99)

## 2024-09-06 LAB — SURGICAL PATHOLOGY

## 2024-09-06 MED ORDER — OLMESARTAN MEDOXOMIL 40 MG PO TABS: 40.0000 mg | ORAL_TABLET | Freq: Every day | ORAL | 2 refills | Status: DC

## 2024-09-06 NOTE — Plan of Care (Signed)
  Problem: Education: Goal: Knowledge of General Education information will improve Description: Including pain rating scale, medication(s)/side effects and non-pharmacologic comfort measures Outcome: Progressing   Problem: Health Behavior/Discharge Planning: Goal: Ability to manage health-related needs will improve Outcome: Progressing   Problem: Health Behavior/Discharge Planning: Goal: Ability to manage health-related needs will improve Outcome: Progressing   Problem: Clinical Measurements: Goal: Ability to maintain clinical measurements within normal limits will improve Outcome: Progressing Goal: Will remain free from infection Outcome: Progressing

## 2024-09-06 NOTE — Progress Notes (Signed)
 Per night RN, patient refused lab drawls this am. MD aware

## 2024-09-06 NOTE — Care Management Obs Status (Signed)
 MEDICARE OBSERVATION STATUS NOTIFICATION   Patient Details  Name: Ryan Herrera MRN: [id number] Date of Birth: 08-24-1946   Medicare Observation Status Notification Given:  Yes  IPCM at the bedside, copy given  Salisha Bardsley, RN 09/06/2024, 10:33 AM

## 2024-09-06 NOTE — Discharge Summary (Signed)
 Physician Discharge Summary   Patient: Ryan Herrera MRN: [id number] DOB: 05/20/1946  Admit date:     09/05/2024  Discharge date: 09/06/24  Discharge Physician: Eric Nunnery   PCP: Bevely Doffing, FNP   Recommendations at discharge:  Repeat basic metabolic panel to follow electrolytes and renal function Reassess blood pressure and adjust antihypertensive treatment as needed Repeat CBC to follow hemoglobin trend/stability.  Discharge Diagnoses: Principal Problem:   Chest pain Active Problems:   BPH (benign prostatic hyperplasia)   CAD (coronary artery disease), native coronary artery   Chronic low back pain   DM type 2 causing vascular disease ([name])   Essential hypertension   Class 1 obesity due to excess calories with serious comorbidity and body mass index (BMI) of 31.0 to 31.9 in adult   Hypothyroidism   Uncontrolled type 2 diabetes mellitus with hypoglycemia, with long-term current use of insulin  ([name])   Chronic diarrhea   GERD (gastroesophageal reflux disease)   CKD (chronic kidney disease), stage II   Mixed hyperlipidemia   Pure hypercholesterolemia   S/P CABG x 2   Low HDL (under 40)   Long-term insulin  use ([name])   Thoracic ascending aortic aneurysm   Nonalcoholic fatty liver disease   Normocytic anemia  Brief Hospital admission narrative: As per H&P written by Dr. Vicci on 09/05/2024  Ryan Herrera is a 78 year old male with history of CAD that is post CABG x 2 1 in 2008 and redo CABG in 2023, type 2 diabetes mellitus, insulin  requiring with renal complications, stage IIIa CKD, GERD, hypertension, hyperlipidemia, former smoker, pancreatic insufficiency, IBS, hypothyroidism, history of poliomyelitis who presented to the ED early this morning with sensation and complaint of severe chest pain that started at around 3 AM.  It woke him up from sleeping.  He says it felt like an ulcer and the pain didn't go away.  He describes it as a burning sensation in  the stomach that subsequently moved into the chest.  He does not have radiation of pain into the arms.  He has no nausea vomiting fever chills.  He reports that he has been eating a lot more in the last few days for Thanksgiving.  He said he had a second Thanksgiving meal yesterday.  His blood sugars are elevated at 300 today.  He also has elevated blood pressure readings.  He says he takes a daily 81 mg aspirin .  He also reported that he took 2 nitroglycerin  tablets with no relief in symptoms.  He also reports that his GI doctor has him taking Creon for pancreatic insufficiency.     His ED workup included an EKG with no acute findings, high-sensitivity troponin tests have been flat at 20, blood pressures elevated 170/72, pulse ox 98% on room air, heart rate 57, blood glucose 304.  He was given Maalox, aspirin , fentanyl  in the ED and now feeling much better.  Observation admission was requested so that he can be seen by cardiology tomorrow given his high CAD risk history.   Assessment and Plan: 1-atypical chest pain - Patient chest pain-free at time of discharge - Cardiac enzymes, EKG, telemetry and 2D echo reassuring - Case discussed with cardiology service who has recommended outpatient follow-up with his primary cardiologist (if further chest pain appreciated will benefit of cardiac MRI and/or repeat cardiac cath). - Continue treatment with aspirin , coreg , statin and Benicar .  2-GERD -continue PPI  3-pancreatic insufficiency - Continue Creon.  4-hyperlipidemia - Continue statin, Repatha  and Zetia  -Heart healthy diet  discussed with patient.  5-class I obesity -Low-calorie diet and portion control discussed with patient -Body mass index is 31.96 kg/m.   6-uncontrolled type 2 diabetes mellitus with vascular complication and chronic kidney disease - Resume home hypoglycemic regimen - Modified carbohydrate diet discussed with patient - Continue to follow CBGs and further adjust  hypoglycemic regimen as required.  7-chronic kidney disease stage II - Is stable and at baseline - Continue to follow renal function dry - Patient advised to maintain adequate hydration.  8-hypertension - Suboptimally controlled - Patient Benicar  adjusted to 40 mg daily - Heart healthy/low-sodium diet discussed with patient - Reassess blood pressure and further adjust antihypertensive agents as required.  9-hypothyroidism - Continue Synthroid .   Consultants: Cardiology service Procedures performed: See below for x-ray reports. Disposition: Home Diet recommendation: Heart healthy, modified carbohydrates and low calorie diet.  DISCHARGE MEDICATION: Allergies as of 09/06/2024       Reactions   Nsaids Other (See Comments)   Kidney damage Non-steroidal anti-inflammatory agent (product)   Semaglutide Nausea And Vomiting   All glutides make pt projectile vomit   2,4-d Dimethylamine Other (See Comments)   Unknown   Myrbetriq [mirabegron] Nausea And Vomiting   Other Diarrhea, Nausea And Vomiting   PT does not want OPIOIDS   GLP-1 drugs - Nausea, vomiting, diarrhea, belching   Jardiance  [empagliflozin ] Other (See Comments)   Pt does not like this medication   Niacin And Related Itching   Statins Itching        Medication List     TAKE these medications    Accu-Chek Guide Me w/Device Kit 1 Piece by Does not apply route as directed.   Accu-Chek Guide Test test strip Generic drug: glucose blood Use to monitor glucose 4  times daily as instructed   acetaminophen  650 MG CR tablet Commonly known as: TYLENOL  Take 1,300 mg by mouth every 8 (eight) hours as needed for pain.   allopurinol  100 MG tablet Commonly known as: ZYLOPRIM  Take 1 tablet (100 mg total) by mouth daily.   aspirin  EC 81 MG tablet Take 1 tablet (81 mg total) by mouth daily.   BD Pen Needle Nano U/F 32G X 4 MM Misc Generic drug: Insulin  Pen Needle 1 each by Does not apply route 4 (four) times  daily.   carvedilol  6.25 MG tablet Commonly known as: COREG  TAKE 1 TABLET BY MOUTH TWICE A DAY   cyanocobalamin  1000 MCG tablet Commonly known as: VITAMIN B12 Take 1,000 mcg by mouth daily.   D3 2000 50 MCG (2000 UT) Caps Generic drug: Cholecalciferol Take 1 capsule by mouth daily.   diclofenac  Sodium 1 % Gel Commonly known as: VOLTAREN  Apply 4 g topically 4 (four) times daily.   ezetimibe  10 MG tablet Commonly known as: ZETIA  Take 1 tablet (10 mg total) by mouth daily.   fluconazole  200 MG tablet Commonly known as: DIFLUCAN  Take 1 tablet (200 mg total) by mouth once a week. What changed: when to take this   fluocinonide  0.05 % external solution Commonly known as: LIDEX  Apply 1 Application topically 2 (two) times daily as needed. To scalp   fluocinonide  0.05 % external solution Commonly known as: LIDEX  Apply 1 Application topically daily.   glipiZIDE  5 MG 24 hr tablet Commonly known as: GLUCOTROL  XL Take 1 tablet (5 mg total) by mouth 2 (two) times daily.   hydrochlorothiazide  25 MG tablet Commonly known as: HYDRODIURIL  Take 1 tablet (25 mg total) by mouth daily.   insulin  lispro  100 UNIT/ML KwikPen Commonly known as: HumaLOG  KwikPen Inject 6-12 Units into the skin 3 (three) times daily. What changed:  when to take this reasons to take this   ketoconazole  2 % shampoo Commonly known as: NIZORAL  Apply 1 Application topically 2 (two) times a week.   levothyroxine  112 MCG tablet Commonly known as: SYNTHROID  Take 1 tablet (112 mcg total) by mouth daily.   metFORMIN  500 MG 24 hr tablet Commonly known as: GLUCOPHAGE -XR TAKE 1 TABLET BY MOUTH EVERY DAY WITH BREAKFAST   mupirocin  cream 2 % Commonly known as: BACTROBAN  APPLY 1 APPLICATION TOPICALLY 2 (TWO) TIMES DAILY. APPLY TO GROIN   nystatin  cream Commonly known as: MYCOSTATIN  APPLY TO THE GROIN AREA ONCE DAILY   olmesartan  40 MG tablet Commonly known as: BENICAR  Take 1 tablet (40 mg total) by mouth  daily. What changed:  medication strength how much to take   pantoprazole  40 MG tablet Commonly known as: PROTONIX  TAKE 1 TABLET BY MOUTH TWICE A DAY What changed: when to take this   Repatha  SureClick 140 MG/ML Soaj Generic drug: Evolocumab  INJECT 140 MG INTO THE SKIN EVERY 14 (FOURTEEN) DAYS. What changed: additional instructions   tadalafil 5 MG tablet Commonly known as: CIALIS Take 5 mg by mouth daily.   THERATEARS PF OP Place 1 drop into both eyes daily as needed (relief from cataract surgery).   Tresiba  FlexTouch 100 UNIT/ML FlexTouch Pen Generic drug: insulin  degludec INJECT 40 UNITS INTO THE SKIN AT BEDTIME. What changed:  when to take this reasons to take this   triamcinolone  cream 0.1 % Commonly known as: KENALOG  Apply 1 Application topically 2 (two) times daily as needed. For feet What changed: reasons to take this   valACYclovir  500 MG tablet Commonly known as: VALTREX  TAKE 1 TABLET BY MOUTH TWICE A DAY        Follow-up Information     Bevely Doffing, FNP. Schedule an appointment as soon as possible for a visit in 10 day(s).   Specialty: Family Medicine Contact information: [address] [place] [address] [phone]         Ladona Heinz, MD. Schedule an appointment as soon as possible for a visit in 2 week(s).   Specialty: Cardiology Contact information: [address] [place] [address] [phone]                Discharge Exam: Ryan Herrera   09/05/24 0625  Weight: 89.8 kg   General exam: Alert, awake, oriented x 3 Respiratory system: Clear to auscultation. Respiratory effort normal. Cardiovascular system:RRR. No murmurs, rubs, gallops. Gastrointestinal system: Abdomen is nondistended, soft and nontender. No organomegaly or masses felt. Normal bowel sounds heard. Central nervous system: Alert and oriented. No focal neurological deficits. Extremities: No C/C/E, +pedal pulses Skin: No rashes,  lesions or ulcers Psychiatry: Judgement and insight appear normal. Mood & affect appropriate.    Condition at discharge: Stable and improved.  The results of significant diagnostics from this hospitalization (including imaging, microbiology, ancillary and laboratory) are listed below for reference.   Imaging Studies: CT HEAD WO CONTRAST ( ) Result Date: 09/06/2024 EXAM: CT HEAD WITHOUT 09/06/2024 10:18:05 AM TECHNIQUE: CT of the head was performed without the administration of intravenous contrast. Automated exposure control, iterative reconstruction, and/or weight based adjustment of the mA/kV was utilized to reduce the radiation dose to as low as reasonably achievable. COMPARISON: head CT 06/21/2023 CLINICAL HISTORY: Head trauma, moderate-severe FINDINGS: BRAIN AND VENTRICLES: There is no evidence of an acute  infarct, intracranial hemorrhage, mass, midline shift, hydrocephalus, or extra-axial fluid collection. Mild cerebral atrophy is within normal limits for age. A small parafalcine subdural hematoma on the prior CT has resolved. Cerebral white matter hypodensities are similar to the prior CT and nonspecific but compatible with mild chronic small vessel ischemic disease. Calcified atherosclerosis at the skull base. ORBITS: Bilateral cataract extraction. SINUSES AND MASTOIDS: No acute abnormality. SOFT TISSUES AND SKULL: No acute skull fracture. No acute soft tissue abnormality. IMPRESSION: 1. No acute intracranial abnormality. 2. Mild chronic small vessel ischemic disease Electronically signed by: Dasie Hamburg MD 09/06/2024 12:59 PM EST RP Workstation: [id number]   ECHOCARDIOGRAM COMPLETE Result Date: 09/05/2024    ECHOCARDIOGRAM REPORT   Patient Name:   Ryan Herrera Date of Exam: 09/05/2024 Medical Rec #:  [id number]            Height:       66.0 in Accession #:    [id number]           Weight:       198.0 lb Date of Birth:  11/05/1945            BSA:          1.991 m Patient Age:    78  years             BP:           148/71 mmHg Patient Gender: M                    HR:           58 bpm. Exam Location:  [facility] Procedure: 2D Echo, Color Doppler and Cardiac Doppler (Both Spectral and Color            Flow Doppler were utilized during procedure). Indications:    Chest Pain R07.9  History:        Patient has prior history of Echocardiogram examinations, most                 recent 10/31/2023. Angina, Previous Myocardial Infarction and                 CAD, Prior CABG, Signs/Symptoms:Shortness of Breath and Chest                 Pain; Risk Factors:Hypertension and Dyslipidemia.  Sonographer:    Koleen Popper RDCS Referring Phys: [phone] [facility] IMPRESSIONS  1. Left ventricular ejection fraction, by estimation, is 60 to 65%. The left ventricle has normal function. The left ventricle has no regional wall motion abnormalities. Left ventricular diastolic parameters are consistent with Grade II diastolic dysfunction (pseudonormalization). Elevated left atrial pressure.  2. Right ventricular systolic function is low normal. The right ventricular size is normal. There is normal pulmonary artery systolic pressure.  3. Left atrial size was moderately dilated.  4. The mitral valve is normal in structure. Trivial mitral valve regurgitation. No evidence of mitral stenosis.  5. The aortic valve is tricuspid. There is moderate calcification of the aortic valve. There is moderate thickening of the aortic valve. Aortic valve regurgitation is not visualized. Aortic valve sclerosis/calcification is present, without any evidence of aortic stenosis.  6. The inferior vena cava is normal in size with greater than 50% respiratory variability, suggesting right atrial pressure of 3 mmHg. Comparison(s): No significant change from prior study. Prior images reviewed side by side. FINDINGS  Left Ventricle: Left ventricular ejection fraction, by estimation, is 60 to 65%.  The left ventricle has normal function. The left  ventricle has no regional wall motion abnormalities. The left ventricular internal cavity size was normal in size. There is  no left ventricular hypertrophy. Left ventricular diastolic parameters are consistent with Grade II diastolic dysfunction (pseudonormalization). Elevated left atrial pressure. Right Ventricle: The right ventricular size is normal. No increase in right ventricular wall thickness. Right ventricular systolic function is low normal. There is normal pulmonary artery systolic pressure. The tricuspid regurgitant velocity is 2.29 m/s,  and with an assumed right atrial pressure of 3 mmHg, the estimated right ventricular systolic pressure is 24.0 mmHg. Left Atrium: Left atrial size was moderately dilated. Right Atrium: Right atrial size was normal in size. Pericardium: There is no evidence of pericardial effusion. Mitral Valve: The mitral valve is normal in structure. Mild mitral annular calcification. Trivial mitral valve regurgitation. No evidence of mitral valve stenosis. Tricuspid Valve: The tricuspid valve is normal in structure. Tricuspid valve regurgitation is trivial. Aortic Valve: The aortic valve is tricuspid. There is moderate calcification of the aortic valve. There is moderate thickening of the aortic valve. Aortic valve regurgitation is not visualized. Aortic valve sclerosis/calcification is present, without any  evidence of aortic stenosis. Aortic valve mean gradient measures 5.9 mmHg. Aortic valve peak gradient measures 9.7 mmHg. Aortic valve area, by VTI measures 1.45 cm. Pulmonic Valve: The pulmonic valve was grossly normal. Pulmonic valve regurgitation is not visualized. No evidence of pulmonic stenosis. Aorta: The aortic root and ascending aorta are structurally normal, with no evidence of dilitation. Venous: The inferior vena cava is normal in size with greater than 50% respiratory variability, suggesting right atrial pressure of 3 mmHg. IAS/Shunts: No atrial level shunt detected  by color flow Doppler.  LEFT VENTRICLE PLAX 2D LVIDd:         5.20 cm   Diastology LVIDs:         3.35 cm   LV e' medial:    0.06 cm/s LV PW:         1.10 cm   LV E/e' medial:  16.9 LV IVS:        1.10 cm   LV e' lateral:   0.11 cm/s LVOT diam:     1.80 cm   LV E/e' lateral: 10.2 LV SV:         57 LV SV Index:   28 LVOT Area:     2.54 cm  RIGHT VENTRICLE            IVC RV Basal diam:  4.00 cm    IVC diam: 1.80 cm RV S prime:     9.18 cm/s TAPSE (M-mode): 1.2 cm LEFT ATRIUM           Index        RIGHT ATRIUM           Index LA diam:      4.80 cm 2.41 cm/m   RA Area:     13.90 cm LA Vol (A2C): 68.2 ml 34.25 ml/m  RA Volume:   32.10 ml  16.12 ml/m LA Vol (A4C): 78.1 ml 39.21 ml/m  AORTIC VALVE AV Area (Vmax):    1.70 cm AV Area (Vmean):   1.50 cm AV Area (VTI):     1.45 cm AV Vmax:           155.84 cm/s AV Vmean:          115.747 cm/s AV VTI:  0.391 m AV Peak Grad:      9.7 mmHg AV Mean Grad:      5.9 mmHg LVOT Vmax:         104.00 cm/s LVOT Vmean:        68.300 cm/s LVOT VTI:          0.223 m LVOT/AV VTI ratio: 0.57  AORTA Ao Root diam: 3.40 cm Ao Asc diam:  3.00 cm MITRAL VALVE               TRICUSPID VALVE MV Area (PHT): 3.48 cm    TR Peak grad:   21.0 mmHg MV Decel Time: 218 msec    TR Vmax:        229.00 cm/s MV E velocity: 1.10 cm/s MV A velocity: 81.10 cm/s  SHUNTS MV E/A ratio:  0.01        Systemic VTI:  0.22 m                            Systemic Diam: 1.80 cm Jerel Croitoru MD Electronically signed by Jerel Balding MD Signature Date/Time: 09/05/2024/4:22:11 PM    Final    DG Chest Port 1 View Result Date: 09/05/2024 CLINICAL DATA:  Acute chest pain.  Coronary artery disease. EXAM: PORTABLE CHEST 1 VIEW COMPARISON:  11/10/2023 FINDINGS: Lordotic positioning noted. Prior CABG. The heart size and mediastinal contours are within normal limits. Both lungs are clear. The visualized skeletal structures are unremarkable. IMPRESSION: No active disease. Electronically Signed   By: Norleen DELENA Kil  M.D.   On: 09/05/2024 06:59    Microbiology: Results for orders placed or performed during the hospital encounter of 06/21/23  MRSA Next Gen by PCR, Nasal     Status: None   Collection Time: 06/21/23 10:45 PM   Specimen: Nasal Mucosa; Nasal Swab  Result Value Ref Range Status   MRSA by PCR Next Gen NOT DETECTED NOT DETECTED Final    Comment: (NOTE) The GeneXpert MRSA Assay (FDA approved for NASAL specimens only), is one component of a comprehensive MRSA colonization surveillance program. It is not intended to diagnose MRSA infection nor to guide or monitor treatment for MRSA infections. Test performance is not FDA approved in patients less than 30 years old. Performed at [facility] [address], [place], [place] [address]     Labs: CBC: Recent Labs  Lab 09/05/24 0630 09/06/24 1336  WBC 5.7 6.0  NEUTROABS 3.4  --   HGB 12.0* 12.4*  HCT 35.4* 37.0*  MCV 90.1 90.9  PLT 157 145*   Basic Metabolic Panel: Recent Labs  Lab 09/05/24 0630 09/06/24 1336  NA 135 135  K 3.9 4.2  CL 100 100  CO2 25 25  GLUCOSE 304* 197*  BUN 18 16  CREATININE 1.14 1.12  CALCIUM  9.1 9.1  MG  --  2.1   Liver Function Tests: Recent Labs  Lab 09/05/24 0630  AST 31  ALT 47*  ALKPHOS 99  BILITOT 0.3  PROT 6.6  ALBUMIN  3.9   CBG: Recent Labs  Lab 09/05/24 1619 09/05/24 2009 09/06/24 0256 09/06/24 0735 09/06/24 1119  GLUCAP 261* 228* 196* 149* 175*    Discharge time spent:  35 minutes.  Signed: Eric Nunnery, MD [facility] 09/06/2024

## 2024-09-06 NOTE — Plan of Care (Signed)
   Problem: Education: Goal: Knowledge of General Education information will improve Description Including pain rating scale, medication(s)/side effects and non-pharmacologic comfort measures Outcome: Progressing   Problem: Health Behavior/Discharge Planning: Goal: Ability to manage health-related needs will improve Outcome: Progressing

## 2024-09-06 NOTE — Inpatient Diabetes Management (Signed)
 Inpatient Diabetes Program Recommendations  AACE/ADA: New Consensus Statement on Inpatient Glycemic Control   Target Ranges:  Prepandial:   less than 140 mg/dL      Peak postprandial:   less than 180 mg/dL (1-2 hours)      Critically ill patients:  140 - 180 mg/dL    Latest Reference Range & Units 09/05/24 11:29 09/05/24 16:19 09/05/24 20:09 09/06/24 02:56 09/06/24 07:35  Glucose-Capillary 70 - 99 mg/dL 768 (H) 738 (H) 771 (H) 196 (H) 149 (H)   Review of Glycemic Control  Diabetes history: DM2 Outpatient Diabetes medications: Tresiba  40 units at bedtime, Humalog  6-12 units TID with meals, Metformin  XR 500 mg QAM, Glipizide  XL 5 mg BID Current orders for Inpatient glycemic control: Semglee  30 units at bedtime, Novolog  0-9 units TID with meals  Inpatient Diabetes Program Recommendations:    Insulin : Per chart, patient refused Semglee  last night and CBG 149 mg/dl this morning.   NOTE: Patient admitted with chest pain and GERD. Per chart review, patient sees Dr. Lenis (Endocrinologist) and was last seen on 07/06/24. Per office note on 07/06/24, patient's A1C was 9.6% then and patient was prescribed Novolog  6 units TID with meals (changed to Humalog  due to insurance) and asked to continue Tresiba  40 units at bedtime, Metformin  500 mg daily, and Glipizide  5 mg BID.   Thanks, Earnie Gainer, RN, MSN, CDCES Diabetes Coordinator Inpatient Diabetes Program [phone] (Team Pager from 8am to 5pm)

## 2024-09-06 NOTE — Progress Notes (Signed)
   09/06/24 1337  TOC Brief Assessment  Insurance and Status Reviewed  Patient has primary care physician Yes  Home environment has been reviewed Home  Prior level of function: Independent  Prior/Current Home Services No current home services  Social Drivers of Health Review SDOH reviewed no interventions necessary  Readmission risk has been reviewed Yes  Transition of care needs no transition of care needs at this time   Inpatient Care Manager (ICM) has reviewed patient and no ICM needs have been identified at this time. We will continue to monitor patient advancement through interdisciplinary progression rounds. If new patient transition needs arise, please place a ICM consult.

## 2024-09-06 NOTE — Consult Note (Addendum)
 Cardiology Consultation   Patient ID: Ryan Herrera MRN: [id number]; DOB: Jul 15, 1946  Admit date: 09/05/2024 Date of Consult: 09/06/2024  PCP:  Ryan Doffing, FNP   [facility] Providers Cardiologist:  Ryan Bergamo, MD      Patient Profile: Ryan Herrera is a 78 y.o. male with a hx of CAD (s/p CABG x 3 in 2008 with re-do CABG at [facility] 03/28/2022 with left RA-RI and SVG-PDA, and had patent LIMA to LAD from 2008), DM with stage IIIa CKD, GERD, HTN, HLD and remote cigarette smoking quit in 1998,  who is being seen 09/06/2024 for the evaluation of chest pain at the request of Dr. Ricky.  History of Present Illness: Ryan Herrera has had a low risk nuclear stress test on 11/18/2023 and echocardiogram essentially normal on 10/31/2023.   Last seen in heart care OV by Dr. Bergamo for follow-up.  At that time, doing well from cardiac standpoint without any complaints.  Continued on ASA 81 mg daily, Coreg  6.25 mg twice daily, Repatha  every 2 weeks, Zetia  10 mg daily, Lasix  20 mg as needed, HCTZ 25 mg daily, olmesartan  20 mg daily.   Presented to [facility] ED 11/30 for chest pain.  CMP WNL except glucose 304, TN 21 > 20, chronic stable normocytic anemia with Hgb 12, iron panel WNL CXR with no acute findings. EKG: Sinus bradycardia, HR 57, prolonged PR interval 296 ms, LVH nonspecific T wave changes with no significant ischemic changes. Echo 09/05/2024 showed EF 60 to 65%, G2 DD, elevated LA pressure, low normal RV systolic function, moderately dilated LA, trivial MV regurgitation, moderate calcification/thickening of AV without AS.  Treated with ASA 324 mg, Maalox/Mylanta and fentanyl .  On interview, patient reported chest pain started Saturday morning at 3 AM while in bed waking him up.  Described as burning ball sensation at the top of the stomach then spread across chest.  He reports the evening before he ate a very large Thanksgiving dinner for the second day in a row.  CP did not  resolve until treated with meds in ED and has not returned.  Patient remains chest pain-free.  He notes this episode was worse than when he was having a heart attack, however he was asymptomatic during previous heart attack.  Denies any recent other episodes of chest pain and angina.  Denies any associated symptoms with chest pain.  He reports chronic unchanged SOB with exertion.  Denies dizziness, palpitations, syncope, LE edema.  He is not very active due to arthritis, however patient is able to walk around grocery store and complete home chores without concern.  He reports compliance with medications.  Patient reports about 2 weeks ago he fell and hit his head due to being pulled by dog.  Denies any bleeding, hematoma or fluid from ears.  He notes since this fall that his blood pressure has been elevated with SBP of 170 at other medical appointments, which is rare since SBP is usually < 130. Also reports headache since this fall. Report chronic blurred vision associated with macular degeneration and previous cataracts. Denies excessive sodium consumption's.  Denies tobacco, etoh or drug use.    Past Medical History:  Diagnosis Date   Arthritis    hands   Coronary artery disease    a. s/p CABG in 2008 at [facility] b. re-do CABG at [facility] in 03/2022 with left RA-RI and SVG-PDA   Diabetes mellitus without complication ([name])    GERD (gastroesophageal reflux disease)  H/O poliomyelitis    age 54.  now has weak stomach muscles   Hypertension    IBS (irritable bowel syndrome)    Iron deficiency anemia 09/22/2018   Myocardial infarct [facility])    Renal insufficiency    Thyroid  disease     Past Surgical History:  Procedure Laterality Date   BACK SURGERY  1967   BACK SURGERY  10/04/2005   benign tumor removal  2004   BIOPSY  11/18/2022   Procedure: BIOPSY;  Surgeon: Ryan Carlin POUR, DO;  Location: [facility] ENDO SUITE;  Service: Endoscopy;;   COLONOSCOPY WITH PROPOFOL  N/A 11/18/2022   Procedure:  COLONOSCOPY WITH PROPOFOL ;  Surgeon: Ryan Carlin POUR, DO;  Location: [facility] ENDO SUITE;  Service: Endoscopy;  Laterality: N/A;  8:30 am, pt did not want early appt. moved down (per PAT)   CORONARY ARTERY BYPASS GRAFT  2008   [facility] - 3 vessel   CORONARY ARTERY BYPASS GRAFT N/A 03/28/2022   Procedure: REDO CORONARY ARTERY BYPASS GRAFTING (CABG) X2 BYPASSES USING OPEN LEFT RADIAL ARTERY AND ENDOSCOPIC RIGHT GREATER SAPHENOUS VEIN HARVEST;  Surgeon: Ryan Elspeth BROCKS, MD;  Location: [facility] OR;  Service: Open Heart Surgery;  Laterality: N/A;   CYSTOSCOPY WITH INSERTION OF UROLIFT     ESOPHAGOGASTRODUODENOSCOPY (EGD) WITH PROPOFOL  N/A 11/18/2022   Procedure: ESOPHAGOGASTRODUODENOSCOPY (EGD) WITH PROPOFOL ;  Surgeon: Ryan Carlin POUR, DO;  Location: [facility] END[facility]ITE;  Service: Endoscopy;  Laterality: N/A;   HERNIA REPAIR Bilateral    inguinal   HYDROCELE EXCISION / REPAIR     JOINT REPLACEMENT     left knee x2, rright x1   LEFT HEART CATH AND CORS/GRAFTS ANGIOGRAPHY N/A 03/25/2022   Procedure: LEFT HEART CATH AND CORS/GRAFTS ANGIOGRAPHY;  Surgeon: Ryan Candyce RAMAN, MD;  Location: [facility] INVASIVE CV LAB;  Service: Cardiovascular;  Laterality: N/A;   POLYPECTOMY  11/18/2022   Procedure: POLYPECTOMY;  Surgeon: Ryan Carlin POUR, DO;  Location: [facility] ENDO [facility]ITE;  Service: Endoscopy;;   PROSTATE SURGERY N/A 01/2021   TEE WITHOUT CARDIOVERSION N/A 03/28/2022   Procedure: TRANSESOPHAGEAL ECHOCARDIOGRAM (TEE);  Surgeon: Ryan Elspeth BROCKS, MD;  Location: [facility] OR;  Service: Open Heart Surgery;  Laterality: N/A;   TONSILLECTOMY       Home Medications:  Prior to Admission medications   Medication Sig Start Date End Date Taking? Authorizing Provider  acetaminophen  (TYLENOL ) 650 MG CR tablet Take 1,300 mg by mouth every 8 (eight) hours as needed for pain.   Yes [provider]  allopurinol  (ZYLOPRIM ) 100 MG tablet Take 1 tablet (100 mg total) by mouth daily. 10/22/21  Yes Paseda, Folashade R, FNP  aspirin  EC  81 MG tablet Take 1 tablet (81 mg total) by mouth daily. 06/30/23  Yes Ryan Herrera Fines, MD  Carboxymethylcellulose Sodium (THERATEARS PF OP) Place 1 drop into both eyes daily as needed (relief from cataract surgery).   Yes [provider]  carvedilol  (COREG ) 6.25 MG tablet TAKE 1 TABLET BY MOUTH TWICE A DAY 10/02/23  Yes Alvan Ronal BRAVO, MD  Cholecalciferol (D3 2000) 50 MCG (2000 UT) CAPS Take 1 capsule by mouth daily.   Yes [provider]  cyanocobalamin  (VITAMIN B12) 1000 MCG tablet Take 1,000 mcg by mouth daily.   Yes [provider]  diclofenac  Sodium (VOLTAREN ) 1 % GEL Apply 4 g topically 4 (four) times daily. 08/12/24  Yes McDonald, Juliene SAUNDERS, DPM  Evolocumab  (REPATHA  SURECLICK) 140 MG/ML SOAJ INJECT 140 MG INTO THE SKIN EVERY 14 (FOURTEEN) DAYS. Patient taking differently: Inject 140  mg into the skin every 14 (fourteen) days. 1 st, 15 th 02/23/24  Yes [facility], Jon Garre, PA  ezetimibe  (ZETIA ) 10 MG tablet Take 1 tablet (10 mg total) by mouth daily. 05/24/24  Yes Ladona Heinz, MD  fluconazole  (DIFLUCAN ) 200 MG tablet Take 1 tablet (200 mg total) by mouth once a week. Patient taking differently: Take 200 mg by mouth every Monday. 10/15/23  Yes Paci, Karina M, MD  fluocinonide  (LIDEX ) 0.05 % external solution Apply 1 Application topically 2 (two) times daily as needed. To scalp 10/09/23  Yes Paci, Karina M, MD  glipiZIDE  (GLUCOTROL  XL) 5 MG 24 hr tablet Take 1 tablet (5 mg total) by mouth 2 (two) times daily. 06/01/24  Yes Nida, Gebreselassie W, MD  hydrochlorothiazide  (HYDRODIURIL ) 25 MG tablet Take 1 tablet (25 mg total) by mouth daily. 05/27/24  Yes Ladona Heinz, MD  insulin  degludec (TRESIBA  FLEXTOUCH) 100 UNIT/ML FlexTouch Pen INJECT 40 UNITS INTO THE SKIN AT BEDTIME. Patient taking differently: Inject 40 Units into the skin at bedtime as needed (high blood sugar). 08/05/24  Yes Nida, Gebreselassie W, MD  insulin  lispro (HUMALOG  KWIKPEN) 100 UNIT/ML KwikPen Inject 6-12 Units into  the skin 3 (three) times daily. Patient taking differently: Inject 6-12 Units into the skin 3 (three) times daily as needed (high blood sugar). 07/06/24  Yes Nida, Ethelle ORN, MD  ketoconazole  (NIZORAL ) 2 % shampoo Apply 1 Application topically 2 (two) times a week. 03/22/24  Yes Melvenia Manus BRAVO, MD  levothyroxine  (SYNTHROID ) 112 MCG tablet Take 1 tablet (112 mcg total) by mouth daily. 03/23/24  Yes Melvenia Manus BRAVO, MD  metFORMIN  (GLUCOPHAGE -XR) 500 MG 24 hr tablet TAKE 1 TABLET BY MOUTH EVERY DAY WITH BREAKFAST 06/15/24  Yes Nida, Gebreselassie W, MD  mupirocin  cream (BACTROBAN ) 2 % APPLY 1 APPLICATION TOPICALLY 2 (TWO) TIMES DAILY. APPLY TO GROIN 11/30/23  Yes Paci, Karina M, MD  nystatin  cream (MYCOSTATIN ) APPLY TO THE GROIN AREA ONCE DAILY 03/22/24  Yes Melvenia Manus BRAVO, MD  olmesartan  (BENICAR ) 20 MG tablet Take 1 tablet (20 mg total) by mouth daily. 01/19/24  Yes [facility], Jon Garre, PA  pantoprazole  (PROTONIX ) 40 MG tablet TAKE 1 TABLET BY MOUTH TWICE A DAY Patient taking differently: Take 40 mg by mouth daily. 06/16/24  Yes Ryan Doffing, FNP  tadalafil (CIALIS) 5 MG tablet Take 5 mg by mouth daily.   Yes [provider]  triamcinolone  cream (KENALOG ) 0.1 % Apply 1 Application topically 2 (two) times daily as needed. For feet Patient taking differently: Apply 1 Application topically 2 (two) times daily as needed (rash, itching). For feet 10/06/23  Yes Paci, Karina M, MD  valACYclovir  (VALTREX ) 500 MG tablet TAKE 1 TABLET BY MOUTH TWICE A DAY 08/23/24  Yes Ryan Doffing, FNP  Blood Glucose Monitoring Suppl (ACCU-CHEK GUIDE ME) w/Device KIT 1 Piece by Does not apply route as directed. 09/29/23   Nida, Gebreselassie W, MD  fluocinonide  (LIDEX ) 0.05 % external solution Apply 1 Application topically daily. 10/09/23   [provider]  glucose blood (ACCU-CHEK GUIDE TEST) test strip Use to monitor glucose 4  times daily as instructed 07/06/24   Nida, Gebreselassie W, MD  Insulin  Pen  Needle (BD PEN NEEDLE NANO U/F) 32G X 4 MM MISC 1 each by Does not apply route 4 (four) times daily. 07/06/24   Nida, Gebreselassie W, MD    Scheduled Meds:  acetaminophen   650 mg Oral Q6H   Or   acetaminophen   650 mg Rectal Q6H  allopurinol   100 mg Oral Daily   aspirin  EC  81 mg Oral Daily   carvedilol   6.25 mg Oral BID WC   enoxaparin  (LOVENOX ) injection  40 mg Subcutaneous Q24H   ezetimibe   10 mg Oral Daily   hydrochlorothiazide   25 mg Oral Daily   insulin  aspart  0-9 Units Subcutaneous TID WC   insulin  glargine-yfgn  30 Units Subcutaneous QHS   irbesartan  150 mg Oral Daily   levothyroxine   112 mcg Oral Q0600   lipase/protease/amylase  72,000 Units Oral TID WC   nystatin  cream   Topical Daily   pantoprazole  (PROTONIX ) IV  40 mg Intravenous Q12H   valACYclovir   500 mg Oral BID   Continuous Infusions:  PRN Meds: bisacodyl , fentaNYL  (SUBLIMAZE ) injection, hydrALAZINE , ondansetron  **OR** ondansetron  (ZOFRAN ) IV, oxyCODONE , traZODone  Allergies:    Allergies  Allergen Reactions   Nsaids Other (See Comments)    Kidney damage Non-steroidal anti-inflammatory agent (product)   Semaglutide Nausea And Vomiting    All glutides make pt projectile vomit   2,4-D Dimethylamine Other (See Comments)    Unknown   Myrbetriq [Mirabegron] Nausea And Vomiting   Other Diarrhea and Nausea And Vomiting    PT does not want OPIOIDS   GLP-1 drugs - Nausea, vomiting, diarrhea, belching   Jardiance  [Empagliflozin ] Other (See Comments)    Pt does not like this medication   Niacin And Related Itching   Statins Itching    Social History:   Social History   Socioeconomic History   Marital status: Divorced    Spouse name: Not on file   Number of children: 1   Years of education: 12   Highest education level: Bachelor's degree (e.g., BA, AB, BS)  Occupational History   Occupation: retired  Tobacco Use   Smoking status: Former    Current packs/day: 0.00    Average packs/day: 1 pack/day  for 38.0 years (38.0 ttl pk-yrs)    Types: Cigarettes    Start date: 12/1958    Quit date: 12/1996    Years since quitting: 27.7   Smokeless tobacco: Never  Vaping Use   Vaping status: Never Used  Substance and Sexual Activity   Alcohol use: Yes    Comment: occassionally   Drug use: No   Sexual activity: Not Currently  Other Topics Concern   Not on file  Social History Narrative   Lives with his significant other, pt is retired     Family History:   Family History  Problem Relation Age of Onset   Hypertension Mother    Heart attack Mother    Osteoarthritis Mother    Stroke Father    Hypertension Father    Stroke Sister    Stroke Sister    Colon cancer Neg Hx    Prostate cancer Neg Hx    Cancer - Lung Neg Hx      ROS:  Please see the history of present illness.  All other ROS reviewed and negative.     Physical Exam/Data: Vitals:   09/05/24 1658 09/05/24 1825 09/05/24 2035 09/06/24 0500  BP: (!) 161/67 138/62 (!) 165/67 (!) 173/76  Pulse: (!) 58 61 (!) 55 61  Resp:   11 15  Temp:  98.2 F (36.8 C) 98 F (36.7 C) 97.8 F (36.6 C)  TempSrc:  Oral Oral Oral  SpO2: 96% 94% 96% 96%  Weight:      Height:        Intake/Output Summary (Last 24 hours) at  09/06/2024 0741 Last data filed at 09/05/2024 2030 Gross per 24 hour  Intake 60 ml  Output --  Net 60 ml      09/05/2024    6:25 AM 08/12/2024    4:01 PM 07/09/2024    2:33 PM  Last 3 Weights  Weight (lbs) 198 lb 199 lb 199 lb 4.7 oz  Weight (kg) 89.812 kg 90.266 kg 90.4 kg     Body mass index is 31.96 kg/m.  General:  Well nourished, well developed, in no acute distress; Sitting on the side of bed eating breakfast.  HEENT: normal Neck: no JVD Vascular: No carotid bruits; Distal pulses 2+ bilaterally Cardiac:  normal S1, S2; RRR; no murmur  Lungs:  clear to auscultation bilaterally, no wheezing, rhonchi or rales  Abd: soft, nontender, no hepatomegaly  Ext: no edema Musculoskeletal:  No deformities,  BUE and BLE strength normal and equal Skin: warm and dry  Neuro:  CNs 2-12 intact, no focal abnormalities noted Psych:  Normal affect   EKG:  The EKG was personally reviewed and demonstrates:  Sinus bradycardia, HR 57, prolonged PR interval 296 ms, LVH nonspecific T wave changes with no significant ischemic changes.  Telemetry:  Telemetry was personally reviewed and demonstrates:  NSR/SB with HR 50-60's with nocturnal pauses   Relevant CV Studies: Cath 2023   Ramus lesion is 80% stenosed.   Lat Ramus lesion is 80% stenosed.   Mid Cx lesion is 75% stenosed.  SVG to OM is occulded.   LPAV lesion is 90% stenosed.   Mid LAD lesion is 100% stenosed. LIMA to LAD is patent.   Origin to Prox Graft lesion is 100% stenosed.   Origin to Prox Graft lesion is 100% stenosed.   Prox RCA lesion is 90% stenosed with 90% stenosed side branch in 1st RPL.  SVG to PDA is occluded.   The left ventricular systolic function is normal.   LV end diastolic pressure is normal.   The left ventricular ejection fraction is 55-65% by visual estimate.   There is no aortic valve stenosis.   Severe disease of the circumflex and branches and well as RCA and branches.  RCA has a sharp angulation and has several branches that arise proximally that would make PCI difficult.  Severe calcific disease in both branches of a ramus vessel and in the left PDA.     SVG to OM and SVG to PDA are occluded.     Consult surgery for possible redo CABG. Prior surgery was done at [facility] in 2008.   Lexiscan  11/2023   Findings are consistent with infarction. The study is low risk.   No ST deviation was noted.   LV perfusion is abnormal. There is no evidence of ischemia. There is evidence of infarction. Defect 1: There is a small defect with moderate reduction in uptake present in the mid to basal inferior location(s) that is fixed. Viability is present. There is normal wall motion in the defect area. Consistent with infarction.   Left  ventricular function is normal. Nuclear stress EF: 59%. The left ventricular ejection fraction is normal (55-65%). End diastolic cavity size is normal. End systolic cavity size is normal.   Prior study not available for comparison.   Appears to have small inferior basal infarct with no ischemia and preserved EF 59%   ECHO IMPRESSIONS 09/05/2024  1. Left ventricular ejection fraction, by estimation, is 60 to 65%. The  left ventricle has normal function. The left ventricle has no regional  wall motion abnormalities. Left ventricular diastolic parameters are  consistent with Grade II diastolic  dysfunction (pseudonormalization). Elevated left atrial pressure.   2. Right ventricular systolic function is low normal. The right  ventricular size is normal. There is normal pulmonary artery systolic  pressure.   3. Left atrial size was moderately dilated.   4. The mitral valve is normal in structure. Trivial mitral valve  regurgitation. No evidence of mitral stenosis.   5. The aortic valve is tricuspid. There is moderate calcification of the  aortic valve. There is moderate thickening of the aortic valve. Aortic  valve regurgitation is not visualized. Aortic valve  sclerosis/calcification is present, without any evidence  of aortic stenosis.   6. The inferior vena cava is normal in size with greater than 50%  respiratory variability, suggesting right atrial pressure of 3 mmHg.   Comparison(s): No significant change from prior study. Prior images  reviewed side by side.    Laboratory Data: High Sensitivity Troponin:  No results for input(s): TROPONINIHS in the last 720 hours.   Chemistry Recent Labs  Lab 09/05/24 0630  NA 135  K 3.9  CL 100  CO2 25  GLUCOSE 304*  BUN 18  CREATININE 1.14  CALCIUM  9.1  GFRNONAA >60  ANIONGAP 10    Recent Labs  Lab 09/05/24 0630  PROT 6.6  ALBUMIN  3.9  AST 31  ALT 47*  ALKPHOS 99  BILITOT 0.3   Lipids No results for input(s): CHOL,  TRIG, HDL, LABVLDL, LDLCALC, CHOLHDL in the last 168 hours.  Hematology Recent Labs  Lab 09/05/24 0630 09/05/24 0834  WBC 5.7  --   RBC 3.93* 3.99*  HGB 12.0*  --   HCT 35.4*  --   MCV 90.1  --   MCH 30.5  --   MCHC 33.9  --   RDW 14.9  --   PLT 157  --    Thyroid  No results for input(s): TSH, FREET4 in the last 168 hours.  BNPNo results for input(s): BNP, PROBNP in the last 168 hours.  DDimer No results for input(s): DDIMER in the last 168 hours.  Radiology/Studies:   DG Chest Port 1 View Result Date: 09/05/2024 CLINICAL DATA:  Acute chest pain.  Coronary artery disease. EXAM: PORTABLE CHEST 1 VIEW COMPARISON:  11/10/2023 FINDINGS: Lordotic positioning noted. Prior CABG. The heart size and mediastinal contours are within normal limits. Both lungs are clear. The visualized skeletal structures are unremarkable. IMPRESSION: No active disease. Electronically Signed   By: Norleen DELENA Kil M.D.   On: 09/05/2024 06:59     Assessment and Plan: CAD HLD Elevated troponin Atypical Chest pain Low risk nuclear stress test on 11/18/2023 and  Presents with atypical chest pain described as burning sensation that resolved in ED with meds and has not returned.  Denies any anginal symptoms. Tn 20 >21.  EKG: Sinus bradycardia, HR 57, prolonged PR interval 296 ms, LVH nonspecific T wave changes with no significant ischemic changes. Tele: NSR/SB with HR 50-60's with nocturnal pauses  Echo 09/05/2024 showed EF 60 to 65%, G2 DD, elevated LA pressure, low normal RV systolic function, moderately dilated LA, trivial MV regurgitation, moderate calcification/thickening of AV without AS.  Less concern for ACS with atypical presentation, EKG without ischemic changes, flat troponin, echo without RWMA, and recent NST. Suspect most likely GI related. No need for any further ischemic evaluation.  Continue ASA 81 mg daily, Coreg  6.25 mg twice daily, Zetia  10 mg daily, irbesartan 150 mg  daily  HTN BP elevated 140s to 170s/60s to 70s with most recent 173/76 Patient reports his BP has been recently elevated up to 170s at other medical appointments over the last 2 weeks after falling and hitting head on the ground.  Also reports headache since this fall. Will discuss with internal med to consider imaging of head. Continue Coreg  6.25 mg twice daily, HCTZ 25 mg daily Recommend to increase irbesartan  150 mg to 300 mg daily,  which would increase home Olmesartan  to 40 mg daily.  Would avoid increasing Coreg  with HR in 50's.   DM2 Managed by primary team  CKD  CR WNL 1.14 Continue to monitor   Risk Assessment/Risk Scores:              For questions or updates, please contact [facility] Please consult [email or web address] for contact info under      Signed, Lorette CINDERELLA Kapur, PA-C  09/06/2024 7:41 AM

## 2024-09-06 NOTE — Progress Notes (Signed)
 Mobility Specialist Progress Note:    09/06/24 1420  Mobility  Activity Ambulated with assistance  Level of Assistance Independent  Assistive Device None  Distance Ambulated (ft) 300 ft  Range of Motion/Exercises Active;All extremities  Activity Response Tolerated well  Mobility Referral Yes  Mobility visit 1 Mobility  Mobility Specialist Start Time (ACUTE ONLY) 1420  Mobility Specialist Stop Time (ACUTE ONLY) 1440  Mobility Specialist Time Calculation (min) (ACUTE ONLY) 20 min   Pt received in bed, agreeable to mobility. Independently able to stand and ambulate with no AD. Tolerated well, SpO2 96-98% on RA and HR 61-78 during ambulation. Returned supine, all needs met.  Ryan Herrera Mobility Specialist Please contact via Special Educational Needs Teacher or  Rehab office at [phone]

## 2024-09-07 NOTE — Progress Notes (Signed)
 Spoke with pt gave him bx results and treatment recommendations

## 2024-09-09 ENCOUNTER — Other Ambulatory Visit: Payer: Self-pay

## 2024-09-10 ENCOUNTER — Other Ambulatory Visit: Payer: Self-pay | Admitting: "Endocrinology

## 2024-09-10 DIAGNOSIS — E1159 Type 2 diabetes mellitus with other circulatory complications: Secondary | ICD-10-CM

## 2024-09-14 ENCOUNTER — Telehealth: Payer: Self-pay | Admitting: "Endocrinology

## 2024-09-14 DIAGNOSIS — E1159 Type 2 diabetes mellitus with other circulatory complications: Secondary | ICD-10-CM

## 2024-09-14 MED ORDER — PEN NEEDLES 32G X 4 MM MISC: 2 refills | Status: AC

## 2024-09-14 NOTE — Telephone Encounter (Signed)
 Rx for pen needles sent to [facility].

## 2024-09-14 NOTE — Telephone Encounter (Signed)
 Pt needs pen needles sent into [facility] [address]  dues to taking meal time insulin  now

## 2024-09-21 ENCOUNTER — Ambulatory Visit

## 2024-09-21 VITALS — BP 116/74 | HR 60 | Ht 66.0 in | Wt 200.0 lb

## 2024-09-21 DIAGNOSIS — E1122 Type 2 diabetes mellitus with diabetic chronic kidney disease: Secondary | ICD-10-CM | POA: Diagnosis not present

## 2024-09-21 DIAGNOSIS — I2510 Atherosclerotic heart disease of native coronary artery without angina pectoris: Secondary | ICD-10-CM | POA: Diagnosis not present

## 2024-09-21 DIAGNOSIS — E11649 Type 2 diabetes mellitus with hypoglycemia without coma: Secondary | ICD-10-CM

## 2024-09-21 DIAGNOSIS — Z794 Long term (current) use of insulin: Secondary | ICD-10-CM

## 2024-09-21 DIAGNOSIS — N182 Chronic kidney disease, stage 2 (mild): Secondary | ICD-10-CM

## 2024-09-21 DIAGNOSIS — E1165 Type 2 diabetes mellitus with hyperglycemia: Secondary | ICD-10-CM

## 2024-09-21 MED ORDER — GLIPIZIDE ER 5 MG PO TB24: 5.0000 mg | ORAL_TABLET | Freq: Two times a day (BID) | ORAL | 1 refills | Status: DC
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
## 2024-09-22 LAB — CBC WITH DIFFERENTIAL/PLATELET
Basophils Absolute: 0 x10E3/uL (ref 0.0–0.2)
Basos: 1 %
EOS (ABSOLUTE): 0.2 x10E3/uL (ref 0.0–0.4)
Eos: 4 %
Hematocrit: 40.8 % (ref 37.5–51.0)
Hemoglobin: 13.5 g/dL (ref 13.0–17.7)
Immature Grans (Abs): 0 x10E3/uL (ref 0.0–0.1)
Immature Granulocytes: 0 %
Lymphocytes Absolute: 2.2 x10E3/uL (ref 0.7–3.1)
Lymphs: 35 %
MCH: 31.3 pg (ref 26.6–33.0)
MCHC: 33.1 g/dL (ref 31.5–35.7)
MCV: 94 fL (ref 79–97)
Monocytes Absolute: 0.4 x10E3/uL (ref 0.1–0.9)
Monocytes: 7 %
Neutrophils Absolute: 3.2 x10E3/uL (ref 1.4–7.0)
Neutrophils: 52 %
Platelets: 198 x10E3/uL (ref 150–450)
RBC: 4.32 x10E6/uL (ref 4.14–5.80)
RDW: 14.4 % (ref 11.6–15.4)
WBC: 6.2 x10E3/uL (ref 3.4–10.8)

## 2024-09-22 LAB — BASIC METABOLIC PANEL WITH GFR
BUN/Creatinine Ratio: 12 (ref 10–24)
BUN: 14 mg/dL (ref 8–27)
CO2: 23 mmol/L (ref 20–29)
Calcium: 9.5 mg/dL (ref 8.6–10.2)
Chloride: 97 mmol/L (ref 96–106)
Creatinine, Ser: 1.18 mg/dL (ref 0.76–1.27)
Glucose: 215 mg/dL — ABNORMAL HIGH (ref 70–99)
Potassium: 4.6 mmol/L (ref 3.5–5.2)
Sodium: 134 mmol/L (ref 134–144)
eGFR: 62 mL/min/1.73 (ref >59)

## 2024-09-24 NOTE — Assessment & Plan Note (Signed)
 Elevated blood glucose, A1c 10. Current insulin  regimen unconventional. Glipizide  twice daily necessary for control. Declined continuous glucose monitor. - Continue insulin  regimen, adjust as needed. - Refilled glipizide . - Encouraged endocrinologist follow-up.

## 2024-09-24 NOTE — Assessment & Plan Note (Signed)
 Stage 2 with normal kidney function. - Ordered metabolic panel.

## 2024-09-24 NOTE — Assessment & Plan Note (Signed)
 Recent hospitalization, no chest pain, blood pressure controlled. - Continue current management.

## 2024-10-01 ENCOUNTER — Ambulatory Visit: Payer: Self-pay
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
## 2024-10-03 ENCOUNTER — Other Ambulatory Visit: Payer: Self-pay | Admitting: "Endocrinology

## 2024-10-04 NOTE — Telephone Encounter (Signed)
 Noted

## 2024-10-06 ENCOUNTER — Ambulatory Visit (INDEPENDENT_AMBULATORY_CARE_PROVIDER_SITE_OTHER): Admitting: "Endocrinology

## 2024-10-06 ENCOUNTER — Other Ambulatory Visit: Payer: Self-pay | Admitting: "Endocrinology

## 2024-10-06 ENCOUNTER — Encounter: Payer: Self-pay | Admitting: "Endocrinology

## 2024-10-06 VITALS — BP 148/87 | HR 59 | Resp 18 | Ht 65.0 in | Wt 203.4 lb

## 2024-10-06 DIAGNOSIS — Z794 Long term (current) use of insulin: Secondary | ICD-10-CM

## 2024-10-06 DIAGNOSIS — I10 Essential (primary) hypertension: Secondary | ICD-10-CM | POA: Diagnosis not present

## 2024-10-06 DIAGNOSIS — E782 Mixed hyperlipidemia: Secondary | ICD-10-CM | POA: Diagnosis not present

## 2024-10-06 DIAGNOSIS — E66811 Obesity, class 1: Secondary | ICD-10-CM

## 2024-10-06 DIAGNOSIS — E039 Hypothyroidism, unspecified: Secondary | ICD-10-CM

## 2024-10-06 DIAGNOSIS — E1159 Type 2 diabetes mellitus with other circulatory complications: Secondary | ICD-10-CM | POA: Diagnosis not present

## 2024-10-06 DIAGNOSIS — Z6833 Body mass index (BMI) 33.0-33.9, adult: Secondary | ICD-10-CM | POA: Diagnosis not present

## 2024-10-06 DIAGNOSIS — E6609 Other obesity due to excess calories: Secondary | ICD-10-CM | POA: Diagnosis not present

## 2024-10-06 MED ORDER — GLIPIZIDE ER 5 MG PO TB24: 5.0000 mg | ORAL_TABLET | Freq: Two times a day (BID) | ORAL | 1 refills | Status: AC

## 2024-10-06 MED ORDER — TRESIBA FLEXTOUCH 100 UNIT/ML ~~LOC~~ SOPN: 45.0000 [IU] | PEN_INJECTOR | Freq: Every day | SUBCUTANEOUS | 2 refills | Status: AC

## 2024-10-06 MED ORDER — INSULIN LISPRO (1 UNIT DIAL) 100 UNIT/ML (KWIKPEN): 6.0000 [IU] | PEN_INJECTOR | Freq: Three times a day (TID) | SUBCUTANEOUS | 2 refills | Status: DC

## 2024-10-06 MED ORDER — NOVOLOG FLEXPEN 100 UNIT/ML ~~LOC~~ SOPN: 6.0000 [IU] | PEN_INJECTOR | Freq: Three times a day (TID) | SUBCUTANEOUS | 2 refills | Status: DC

## 2024-10-06 NOTE — Progress Notes (Signed)
 "                         10/06/2024, 4:04 PM   Endocrinology follow-up note  Subjective:    Patient ID: Ryan Herrera, male    DOB: November 20, 1945.  Ryan Herrera is being seen in follow-up after he was feeling consultation for management of currently uncontrolled symptomatic diabetes requested by  Bevely Doffing, FNP.   Past Medical History:  Diagnosis Date   Arthritis    hands   Coronary artery disease    a. s/p CABG in 2008 at [facility] b. re-do CABG at [facility] in 03/2022 with left RA-RI and SVG-PDA   Diabetes mellitus without complication ([name])    GERD (gastroesophageal reflux disease)    H/O poliomyelitis    age 78.  now has weak stomach muscles   Hypertension    IBS (irritable bowel syndrome)    Iron deficiency anemia 09/22/2018   Myocardial infarct [facility])    Renal insufficiency    Squamous cell carcinoma in situ 09/01/2024   right hand - posterior- NEEDS MOHS   Thyroid  disease     Past Surgical History:  Procedure Laterality Date   BACK SURGERY  1967   BACK SURGERY  10/04/2005   benign tumor removal  2004   BIOPSY  11/18/2022   Procedure: BIOPSY;  Surgeon: Cindie Carlin POUR, DO;  Location: [facility];  Service: Endoscopy;;   COLONOSCOPY WITH PROPOFOL  N/A 11/18/2022   Procedure: COLONOSCOPY WITH PROPOFOL ;  Surgeon: Cindie Carlin POUR, DO;  Location: [facility];  Service: Endoscopy;  Laterality: N/A;  8:30 am, pt did not want early appt. moved down (per PAT)   CORONARY ARTERY BYPASS GRAFT  2008   [facility] - 3 vessel   CORONARY ARTERY BYPASS GRAFT N/A 03/28/2022   Procedure: REDO CORONARY ARTERY BYPASS GRAFTING (CABG) X2 BYPASSES USING OPEN LEFT RADIAL ARTERY AND ENDOSCOPIC RIGHT GREATER SAPHENOUS VEIN HARVEST;  Surgeon: Kerrin Elspeth BROCKS, MD;  Location: [facility] OR;  Service: Open Heart Surgery;  Laterality: N/A;   CYSTOSCOPY WITH INSERTION OF UROLIFT     ESOPHAGOGASTRODUODENOSCOPY (EGD) WITH PROPOFOL  N/A 11/18/2022   Procedure: ESOPHAGOGASTRODUODENOSCOPY (EGD)  WITH PROPOFOL ;  Surgeon: Cindie Carlin POUR, DO;  Location: [facility];  Service: Endoscopy;  Laterality: N/A;   HERNIA REPAIR Bilateral    inguinal   HYDROCELE EXCISION / REPAIR     JOINT REPLACEMENT     left knee x2, rright x1   LEFT HEART CATH AND CORS/GRAFTS ANGIOGRAPHY N/A 03/25/2022   Procedure: LEFT HEART CATH AND CORS/GRAFTS ANGIOGRAPHY;  Surgeon: Dann Candyce RAMAN, MD;  Location: [facility] INVASIVE CV LAB;  Service: Cardiovascular;  Laterality: N/A;   POLYPECTOMY  11/18/2022   Procedure: POLYPECTOMY;  Surgeon: Cindie Carlin POUR, DO;  Location: [facility];  Service: Endoscopy;;   PROSTATE SURGERY N/A 01/2021   TEE WITHOUT CARDIOVERSION N/A 03/28/2022   Procedure: TRANSESOPHAGEAL ECHOCARDIOGRAM (TEE);  Surgeon: Kerrin Elspeth BROCKS, MD;  Location: [facility] OR;  Service: Open Heart Surgery;  Laterality: N/A;   TONSILLECTOMY      Social History   Socioeconomic History   Marital status: Divorced    Spouse name: Not on file   Number of children: 1   Years of education: 12   Highest education level: Bachelor's degree (e.g., BA, AB, BS)  Occupational History   Occupation: retired  Tobacco Use   Smoking status: Former    Current packs/day: 0.00    Average  packs/day: 1 pack/day for 38.0 years (38.0 ttl pk-yrs)    Types: Cigarettes    Start date: 12/1958    Quit date: 12/1996    Years since quitting: 27.8   Smokeless tobacco: Never  Vaping Use   Vaping status: Never Used  Substance and Sexual Activity   Alcohol use: Yes    Comment: occassionally   Drug use: No   Sexual activity: Not Currently  Other Topics Concern   Not on file  Social History Narrative   Lives with his significant other, pt is retired    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   Tobacco Use: Medium Risk (10/06/2024)   Patient History    Smoking Tobacco Use: Former    Smokeless Tobacco Use: Never    Passive Exposure: Not on Actuary Strain: Low Risk (12/08/2023)   Overall Financial Resource Strain  (CARDIA)    Difficulty of Paying Living Expenses: Not hard at all  Food Insecurity: Food Insecurity Present (09/05/2024)   Epic    Worried About Programme Researcher, Broadcasting/film/video in the Last Year: Sometimes true    Ran Out of Food in the Last Year: Sometimes true  Transportation Needs: No Transportation Needs (09/05/2024)   Epic    Lack of Transportation (Medical): No    Lack of Transportation (Non-Medical): No  Physical Activity: Inactive (12/08/2023)   Exercise Vital Sign    Days of Exercise per Week: 0 days    Minutes of Exercise per Session: 0 min  Stress: No Stress Concern Present (12/18/2023)   [employer] of Occupational Health - Occupational Stress Questionnaire    Feeling of Stress : Only a little  Social Connections: Moderately Isolated (09/05/2024)   Social Connection and Isolation Panel    Frequency of Communication with Friends and Family: More than three times a week    Frequency of Social Gatherings with Friends and Family: Patient declined    Attends Religious Services: Never    Database Administrator or Organizations: No    Attends Engineer, Structural: Patient declined    Marital Status: Living with partner  Depression (PHQ2-9): Medium Risk (07/09/2024)   Depression (PHQ2-9)    PHQ-2 Score: 6  Alcohol Screen: Low Risk (12/18/2023)   Alcohol Screen    Last Alcohol Screening Score (AUDIT): 1  Housing: Low Risk (09/05/2024)   Epic    Unable to Pay for Housing in the Last Year: No    Number of Times Moved in the Last Year: 0    Homeless in the Last Year: No  Utilities: Not At Risk (09/05/2024)   Epic    Threatened with loss of utilities: No  Health Literacy: Adequate Health Literacy (12/08/2023)   B1300 Health Literacy    Frequency of need for help with medical instructions: Never    Family History  Problem Relation Age of Onset   Hypertension Mother    Heart attack Mother    Osteoarthritis Mother    Stroke Father    Hypertension Father    Stroke Sister     Stroke Sister    Colon cancer Neg Hx    Prostate cancer Neg Hx    Cancer - Lung Neg Hx     Outpatient Encounter Medications as of 10/06/2024  Medication Sig   acetaminophen  (TYLENOL ) 650 MG CR tablet Take 1,300 mg by mouth every 8 (eight) hours as needed for pain.   allopurinol  (ZYLOPRIM ) 100 MG tablet Take 1 tablet (100 mg total) by mouth daily.  aspirin  EC 81 MG tablet Take 1 tablet (81 mg total) by mouth daily.   Blood Glucose Monitoring Suppl (ACCU-CHEK GUIDE ME) w/Device KIT 1 Piece by Does not apply route as directed.   Carboxymethylcellulose Sodium (THERATEARS PF OP) Place 1 drop into both eyes daily as needed (relief from cataract surgery).   carvedilol  (COREG ) 6.25 MG tablet TAKE 1 TABLET BY MOUTH TWICE A DAY   Cholecalciferol (D3 2000) 50 MCG (2000 UT) CAPS Take 1 capsule by mouth daily.   cyanocobalamin  (VITAMIN B12) 1000 MCG tablet Take 1,000 mcg by mouth daily.   diclofenac  Sodium (VOLTAREN ) 1 % GEL Apply 4 g topically 4 (four) times daily.   Evolocumab  (REPATHA  SURECLICK) 140 MG/ML SOAJ INJECT 140 MG INTO THE SKIN EVERY 14 (FOURTEEN) DAYS. (Patient taking differently: Inject 140 mg into the skin every 14 (fourteen) days. 1 st, 15 th)   ezetimibe  (ZETIA ) 10 MG tablet Take 1 tablet (10 mg total) by mouth daily.   fluconazole  (DIFLUCAN ) 200 MG tablet Take 1 tablet (200 mg total) by mouth once a week. (Patient taking differently: Take 200 mg by mouth every Monday.)   fluocinonide  (LIDEX ) 0.05 % external solution Apply 1 Application topically daily.   glucose blood (ACCU-CHEK GUIDE TEST) test strip Use to monitor glucose 4  times daily as instructed   hydrochlorothiazide  (HYDRODIURIL ) 25 MG tablet Take 1 tablet (25 mg total) by mouth daily.   insulin  lispro (HUMALOG  KWIKPEN) 100 UNIT/ML KwikPen Inject 6-12 Units into the skin 3 (three) times daily.   Insulin  Pen Needle (PEN NEEDLES) 32G X 4 MM MISC Use to inject insulin  four times daily as directed.   ketoconazole  (NIZORAL ) 2 %  shampoo Apply 1 Application topically 2 (two) times a week.   levothyroxine  (SYNTHROID ) 112 MCG tablet Take 1 tablet (112 mcg total) by mouth daily.   metFORMIN  (GLUCOPHAGE -XR) 500 MG 24 hr tablet TAKE 1 TABLET BY MOUTH EVERY DAY WITH BREAKFAST   mupirocin  cream (BACTROBAN ) 2 % APPLY 1 APPLICATION TOPICALLY 2 (TWO) TIMES DAILY. APPLY TO GROIN   nystatin  cream (MYCOSTATIN ) APPLY TO THE GROIN AREA ONCE DAILY   olmesartan  (BENICAR ) 40 MG tablet Take 1 tablet (40 mg total) by mouth daily.   pantoprazole  (PROTONIX ) 40 MG tablet TAKE 1 TABLET BY MOUTH TWICE A DAY (Patient taking differently: Take 40 mg by mouth daily.)   tadalafil (CIALIS) 5 MG tablet Take 5 mg by mouth daily.   triamcinolone  cream (KENALOG ) 0.1 % Apply 1 Application topically 2 (two) times daily as needed. For feet (Patient taking differently: Apply 1 Application topically 2 (two) times daily as needed (rash, itching). For feet)   valACYclovir  (VALTREX ) 500 MG tablet TAKE 1 TABLET BY MOUTH TWICE A DAY   [DISCONTINUED] glipiZIDE  (GLUCOTROL  XL) 5 MG 24 hr tablet Take 1 tablet (5 mg total) by mouth 2 (two) times daily.   [DISCONTINUED] insulin  aspart (NOVOLOG  FLEXPEN) 100 UNIT/ML FlexPen Inject 6-12 Units into the skin 3 (three) times daily before meals.   [DISCONTINUED] insulin  degludec (TRESIBA  FLEXTOUCH) 100 UNIT/ML FlexTouch Pen INJECT 40 UNITS INTO THE SKIN AT BEDTIME. (Patient taking differently: Inject 40 Units into the skin at bedtime as needed (high blood sugar).)   [DISCONTINUED] insulin  lispro (HUMALOG  KWIKPEN) 100 UNIT/ML KwikPen Inject 6-12 Units into the skin 3 (three) times daily. (Patient taking differently: Inject 6-12 Units into the skin 3 (three) times daily as needed (high blood sugar). Pt takes 1 unit if blood sugar is between 150-200   2 units if  200-300  After any higher than that is 3 and the 4 units and no more than 8-12 units a days)   glipiZIDE  (GLUCOTROL  XL) 5 MG 24 hr tablet Take 1 tablet (5 mg total) by mouth  2 (two) times daily.   insulin  degludec (TRESIBA  FLEXTOUCH) 100 UNIT/ML FlexTouch Pen Inject 45 Units into the skin at bedtime.   [DISCONTINUED] diclofenac  Sodium (VOLTAREN ) 1 % GEL Apply 4 g topically 4 (four) times daily.   [DISCONTINUED] fluocinonide  (LIDEX ) 0.05 % external solution Apply 1 Application topically 2 (two) times daily as needed. To scalp (Patient not taking: Reported on 09/21/2024)   [DISCONTINUED] furosemide  (LASIX ) 20 MG tablet Take 1 tablet (20 mg total) by mouth as needed (FOR WEIGHT GAIN > 2-3POUNDS OVERNIGHT OR>5 POUNDS IN A WEEK). (Patient not taking: Reported on 09/05/2024)   [DISCONTINUED] glipiZIDE  (GLUCOTROL  XL) 5 MG 24 hr tablet Take 1 tablet (5 mg total) by mouth 2 (two) times daily.   [DISCONTINUED] glucose blood (ACCU-CHEK GUIDE TEST) test strip Use to monitor glucose 2 times daily as instructed   [DISCONTINUED] insulin  aspart (NOVOLOG  FLEXPEN) 100 UNIT/ML FlexPen Inject 6-12 Units into the skin 3 (three) times daily with meals.   [DISCONTINUED] insulin  degludec (TRESIBA  FLEXTOUCH) 100 UNIT/ML FlexTouch Pen Inject 40 Units into the skin at bedtime.   [DISCONTINUED] Insulin  Pen Needle (BD PEN NEEDLE NANO U/F) 32G X 4 MM MISC 1 each by Does not apply route 4 (four) times daily.   [DISCONTINUED] ipratropium (ATROVENT ) 0.03 % nasal spray Place 2 sprays into both nostrils every 12 (twelve) hours. (Patient not taking: Reported on 09/05/2024)   [DISCONTINUED] metFORMIN  (GLUCOPHAGE -XR) 500 MG 24 hr tablet TAKE 1 TABLET BY MOUTH EVERY DAY WITH BREAKFAST   [DISCONTINUED] olmesartan  (BENICAR ) 20 MG tablet Take 1 tablet (20 mg total) by mouth daily.   [DISCONTINUED] valACYclovir  (VALTREX ) 500 MG tablet TAKE 1 TABLET BY MOUTH TWICE A DAY   No facility-administered encounter medications on file as of 10/06/2024.    ALLERGIES: Allergies  Allergen Reactions   Nsaids Other (See Comments)    Kidney damage Non-steroidal anti-inflammatory agent (product)   Semaglutide Nausea And  Vomiting    All glutides make pt projectile vomit   2,4-D Dimethylamine Other (See Comments)    Unknown   Myrbetriq [Mirabegron] Nausea And Vomiting   Other Diarrhea and Nausea And Vomiting    PT does not want OPIOIDS   GLP-1 drugs - Nausea, vomiting, diarrhea, belching   Jardiance  [Empagliflozin ] Other (See Comments)    Pt does not like this medication   Niacin And Related Itching   Statins Itching    VACCINATION STATUS: Immunization History  Administered Date(s) Administered   Fluad Quad(high Dose 65+) 06/14/2022   Fluad Trivalent(High Dose 65+) 06/24/2023   INFLUENZA, HIGH DOSE SEASONAL PF 06/13/2015, 07/18/2016, 06/11/2017, 07/08/2018, 07/02/2019, 09/15/2020, 07/27/2021, 06/21/2024   Influenza Split 07/19/2013   Influenza,inj,Quad PF,6+ Mos 08/08/2014   Influenza-Unspecified 07/15/2012, 07/15/2013, 08/08/2014, 06/13/2015, 07/18/2016   PFIZER Comirnaty(Gray Top)Covid-19 Tri-Sucrose Vaccine 11/27/2019, 12/19/2019, 07/26/2020, 04/20/2021   PPD Test 01/22/2013, 01/22/2013   Pfizer Covid-19 Vaccine Bivalent Booster 42yrs & up 07/27/2021   Pneumococcal Conjugate-13 05/04/2014   Pneumococcal Polysaccharide-23 07/15/2012   Td 02/15/2003   Tdap 05/04/2014, 03/09/2022   Zoster Recombinant(Shingrix) 02/12/2017, 02/13/2017, 06/04/2017    Diabetes He presents for his follow-up diabetic visit. He has type 2 diabetes mellitus. Onset time: He was diagnosed at approximate age of 48 years. His disease course has been worsening. There are no hypoglycemic associated symptoms.  Pertinent negatives for hypoglycemia include no confusion, headaches, pallor or seizures. Pertinent negatives for diabetes include no chest pain, no fatigue, no polydipsia, no polyphagia, no polyuria and no weakness. There are no hypoglycemic complications. Symptoms are worsening. Diabetic complications include heart disease. Risk factors for coronary artery disease include dyslipidemia, diabetes mellitus, family history,  obesity, male sex, hypertension, tobacco exposure and sedentary lifestyle. Current diabetic treatment includes insulin  injections (He is currently on Tresiba  35 units daily, metformin  500 mg p.o. once daily, glipizide  5 mg p.o. twice daily.). His weight is increasing steadily. He is following a generally unhealthy diet. When asked about meal planning, he reported none. He rarely participates in exercise. His home blood glucose trend is increasing steadily. His breakfast blood glucose range is generally 180-200 mg/dl. His overall blood glucose range is 180-200 mg/dl. Melhorn presents with a meter showing only 1 reading per day average of 185 for the last 7 days.  His recent A1c was 10% on September 05, 2024.  He does not report hypoglycemia.  He has not been consistent with his prandial insulin .  ) An ACE inhibitor/angiotensin II receptor blocker is being taken.  Hyperlipidemia This is a chronic problem. The current episode started more than 1 year ago. The problem is controlled. Exacerbating diseases include diabetes. Pertinent negatives include no chest pain, myalgias or shortness of breath. Treatments tried: He is on Repatha  injection as well as Zetia  10 mg p.o. daily. Risk factors for coronary artery disease include dyslipidemia, diabetes mellitus, family history, obesity, male sex, hypertension and a sedentary lifestyle.  Hypertension This is a chronic problem. The current episode started more than 1 year ago. Pertinent negatives include no chest pain, headaches, neck pain, palpitations or shortness of breath. Risk factors for coronary artery disease include family history, dyslipidemia, diabetes mellitus, male gender, obesity and sedentary lifestyle. Past treatments include angiotensin blockers. Hypertensive end-organ damage includes CAD/MI.     Objective:       10/06/2024    2:11 PM 09/21/2024    1:34 PM 09/06/2024    3:46 PM  Vitals with BMI  Height 5' 5 5' 6   Weight 203 lbs 6 oz 200 lbs    BMI 33.85 32.3   Systolic 148 116 853  Diastolic 87 74 68  Pulse 59 60 57    BP (!) 148/87   Pulse (!) 59   Resp 18   Ht 5' 5 (1.651 m)   Wt 203 lb 6.4 oz (92.3 kg)   SpO2 97%   BMI 33.85 kg/m   Wt Readings from Last 3 Encounters:  10/06/24 203 lb 6.4 oz (92.3 kg)  09/21/24 200 lb (90.7 kg)  09/05/24 198 lb (89.8 kg)     CMP ( most recent) CMP     Component Value Date/Time   NA 134 09/21/2024 1419   NA 139 04/29/2014 1500   K 4.6 09/21/2024 1419   K 4.2 04/29/2014 1500   CL 97 09/21/2024 1419   CL 103 04/29/2014 1500   CO2 23 09/21/2024 1419   CO2 27 04/29/2014 1500   GLUCOSE 215 (H) 09/21/2024 1419   GLUCOSE 197 (H) 09/06/2024 1336   GLUCOSE 111 (H) 04/29/2014 1500   BUN 14 09/21/2024 1419   BUN 16 04/29/2014 1500   CREATININE 1.18 09/21/2024 1419   CREATININE 1.14 04/29/2014 1500   CALCIUM  9.5 09/21/2024 1419   CALCIUM  9.2 04/29/2014 1500   PROT 6.6 09/05/2024 0630   PROT 6.9 01/22/2023 1301   ALBUMIN  3.9  09/05/2024 0630   ALBUMIN  4.0 01/22/2023 1301   AST 31 09/05/2024 0630   ALT 47 (H) 09/05/2024 0630   ALKPHOS 99 09/05/2024 0630   BILITOT 0.3 09/05/2024 0630   BILITOT 0.3 01/22/2023 1301   GFRNONAA >60 09/06/2024 1336   GFRNONAA >60 04/29/2014 1500   GFRAA >60 05/12/2020 1848   GFRAA >60 04/29/2014 1500     Diabetic Labs (most recent): Lab Results  Component Value Date   HGBA1C 10.0 (H) 09/05/2024   HGBA1C 9.6 (A) 07/06/2024   HGBA1C 8.3 (A) 03/04/2024    Lipid Panel     Component Value Date/Time   CHOL 84 09/06/2024 1336   CHOL 99 (L) 03/22/2024 1424   TRIG 122 09/06/2024 1336   HDL 37 (L) 09/06/2024 1336   HDL 44 03/22/2024 1424   CHOLHDL 2.3 09/06/2024 1336   VLDL 24 09/06/2024 1336   LDLCALC 23 09/06/2024 1336   LDLCALC 41 03/22/2024 1424   LABVLDL 14 03/22/2024 1424        Lab Results  Component Value Date   TSH 6.480 (H) 03/22/2024   TSH 3.950 01/22/2023   TSH 2.990 10/02/2022   TSH 2.349 03/24/2022   TSH 5.760 (H)  03/12/2022   TSH 4.124 12/21/2018   TSH 5.545 (H) 09/22/2018   TSH 4.84 (H) 04/29/2014   FREET4 1.17 03/22/2024   FREET4 1.62 01/22/2023   FREET4 1.44 10/02/2022   FREET4 1.14 (H) 03/24/2022   FREET4 1.01 12/21/2018      Assessment & Plan:   1. DM type 2 causing vascular disease ([name])  - Elsie KANDICE Sailors has currently uncontrolled symptomatic type 2 DM since  78 years of age. Olon presents with a meter showing only 1 reading per day average of 185 for the last 7 days.  His recent A1c was 10% on September 05, 2024.  He does not report hypoglycemia.  He has not been consistent with his prandial insulin .    Recent labs reviewed.  - I had a long discussion with him about the progressive nature of diabetes and the pathology behind its complications. -his diabetes is complicated by coronary artery disease status post cardiac bypass in 2008, recurrent coronary artery disease, obesity/sedentary life, comorbid hypertension /hyperlipidemia, history of smoking and he remains at a high risk for more acute and chronic complications which include CAD, CVA, CKD, retinopathy, and neuropathy. These are all discussed in detail with him.  - I discussed all available options of managing his diabetes including de-escalation of medications.  He did not engage optimally, however Patient is encouraged to switch to  unprocessed or minimally processed  complex starch, adequate protein intake (mainly plant source), minimal liquid fat ( mainly vegetable oils), plenty of fruits, and vegetables.  -  he is advised to stick to a routine mealtimes to eat 3 complete meals a day and snack only when necessary ( to snack only to correct hypoglycemia BG <70 day time or <100 at night).   - he acknowledges that there is a room for improvement in his food and drink choices. - Suggestion is made for him to avoid simple carbohydrates  from his diet including Cakes, Sweet Desserts, Ice Cream, Soda (diet and regular), Sweet  Tea, Candies, Chips, Cookies, Store Bought Juices, Alcohol , Artificial Sweeteners,  Coffee Creamer, and Sugar-free Products, Lemonade. This will help patient to have more stable blood glucose profile and potentially avoid unintended weight gain.   - he will be scheduled with Penny Crumpton, RDN, CDE  for individualized diabetes education.  - I have approached him with the following plan to manage  his diabetes and patient agrees:   -Based on his presentation with significant loss of control of glycemia, he will still need multiple daily injections of insulin .   He has declined an offer for CGM.  He is not monitoring blood glucose optimally for optimal use of basal/bolus insulin .  Since he is not going to use a CGM, he is advised to monitor blood glucose 4 times a day-daily before meals and at bedtime. -He is advised to continue Tresiba  45 units nightly, and resume Humalog  6-12 units 3 times daily AC for Premeal blood glucose above 90 mg/dl. He may continue to benefit from low-dose metformin  and glipizide . -He is advised to continue metformin  500 mg p.o. once a day, and glipizide  5 mg p.o. once a day at breakfast.     He did have intolerance to SGLT2 inhibitors and wishes to avoid this medications.  - Specific targets for  A1c;  LDL, HDL,  and Triglycerides were discussed with the patient.  2) Blood Pressure /Hypertension:   His blood pressure is not controlled to target.  he is advised to continue his current medications including losartan  25 mg p.o. daily with breakfast .   3) Lipids/Hyperlipidemia:   Review of his recent lipid panel showed controlled LDL at 23.  He reports statin intolerance.    He is advised to continue Repatha  140 mg every 14 days, Zetia  10 mg every night.   Side effects and precautions discussed with him.   4)  Weight/Diet:  Body mass index is 33.85 kg/m.  -   clearly complicating his diabetes care.   he is  a candidate for weight loss. I discussed with him the fact  that loss of 5 - 10% of his  current body weight will have the most impact on his diabetes management.  The above detailed  ACLM recommendations for nutrition, exercise, sleep, social life, avoidance of risky substances, the need for restorative sleep   information will also detailed on discharge instructions.  5) Chronic Care/Health Maintenance:  -he  is on ARB  medications and  is encouraged to initiate and continue to follow up with Ophthalmology, Dentist,  Podiatrist at least yearly or according to recommendations, and advised to   stay away from smoking. I have recommended yearly flu vaccine and pneumonia vaccine at least every 5 years; moderate intensity exercise for up to 150 minutes weekly; and  sleep for 7- 9 hours a day.   6) hypothyroidism: Circumstance of diagnosis not available to review.  His recent thyroid  function tests were consistent with inadequate replacement.  He is to continue on levothyroxine  112 mcg p.o. daily  before breakfast.      - We discussed about the correct intake of his thyroid  hormone, on empty stomach at fasting, with water, separated by at least 30 minutes from breakfast and other medications,  and separated by more than 4 hours from calcium , iron, multivitamins, acid reflux medications (PPIs). -Patient is made aware of the fact that thyroid  hormone replacement is needed for life, dose to be adjusted by periodic monitoring of thyroid  function tests.   - he is  advised to maintain close follow up with Bevely Doffing, FNP for primary care needs, as well as his other providers for optimal and coordinated care.  I spent  26  minutes in the care of the patient today including review of labs from CMP, Lipids, Thyroid   Function, Hematology (current and previous including abstractions from other facilities); face-to-face time discussing  his blood glucose readings/logs, discussing hypoglycemia and hyperglycemia episodes and symptoms, medications doses, his options of short  and long term treatment based on the latest standards of care / guidelines;  discussion about incorporating lifestyle medicine;  and documenting the encounter. Risk reduction counseling performed per USPSTF guidelines to reduce obesity and cardiovascular risk factors.     Please refer to Patient Instructions for Blood Glucose Monitoring and Insulin /Medications Dosing Guide  in media tab for additional information. Please  also refer to  Patient Self Inventory in the Media  tab for reviewed elements of pertinent patient history.  Elsie KANDICE Sailors participated in the discussions, expressed understanding, and voiced agreement with the above plans.  All questions were answered to his satisfaction. he is encouraged to contact clinic should he have any questions or concerns prior to his return visit.    Follow up plan: - Return in about 9 weeks (around 12/08/2024) for Bring Meter/CGM Device/Logs- A1c in Office.  Ranny Earl, MD [facility] [address] [facility] [address] [place], [place] [address] Phone: [phone]  Fax: [phone]    10/06/2024, 4:04 PM  This note was partially dictated with voice recognition software. Similar sounding words can be transcribed inadequately or may not  be corrected upon review.  "

## 2024-10-06 NOTE — Telephone Encounter (Signed)
 Humalog  was sent in as a replacement for Novolog . Attempted to contact patient lvm for patient advising of change.

## 2024-10-06 NOTE — Patient Instructions (Signed)

## 2024-10-07 ENCOUNTER — Encounter: Payer: Self-pay | Admitting: Hematology and Oncology

## 2024-10-08 ENCOUNTER — Encounter: Payer: Self-pay | Admitting: Hematology and Oncology

## 2024-10-08 ENCOUNTER — Other Ambulatory Visit: Payer: Self-pay | Admitting: "Endocrinology

## 2024-10-12 ENCOUNTER — Ambulatory Visit

## 2024-10-12 DIAGNOSIS — M19072 Primary osteoarthritis, left ankle and foot: Secondary | ICD-10-CM

## 2024-10-12 DIAGNOSIS — M216X2 Other acquired deformities of left foot: Secondary | ICD-10-CM | POA: Diagnosis not present

## 2024-10-12 DIAGNOSIS — M216X1 Other acquired deformities of right foot: Secondary | ICD-10-CM

## 2024-10-12 DIAGNOSIS — E1142 Type 2 diabetes mellitus with diabetic polyneuropathy: Secondary | ICD-10-CM | POA: Diagnosis not present

## 2024-10-12 DIAGNOSIS — M19071 Primary osteoarthritis, right ankle and foot: Secondary | ICD-10-CM | POA: Diagnosis not present

## 2024-10-12 NOTE — Progress Notes (Signed)
"  °  Subjective:  Patient ID: Ryan Herrera, male    DOB: 07-28-1946,  MRN: [id number]  Chief Complaint  Patient presents with   Arthritis    Rm13 follow up midfoot arthritis    Discussed the use of AI scribe software for clinical note transcription with the patient, who gave verbal consent to proceed.  History of Present Illness Ryan Herrera is an 79 year old male with diabetes who presents with bilateral foot pain. He was supposed to get diabetic shoes recently but had a problem with his insurance. He has not yet acquired the diabetic shoes. He is interested in the diabetic shoes.         Objective:    Physical Exam VASCULAR: DP and PT pulse palpable. Foot is warm and well-perfused. Capillary fill time is brisk. DERMATOLOGIC: Normal skin turgor, texture, and temperature. No open lesions, rashes, or ulcerations. NEUROLOGIC: Normal sensation to light touch and pressure. No paresthesias.  Some decreased sensation.  ORTHOPEDIC: Pain on palpation of dorsal midfoot in midtarsal joint. Bilateral pes planus deformity. Smooth pain-free range of motion of all examined joints. No ecchymosis or bruising. No gross deformity.      Results RADIOLOGY Foot X-ray: Moderate osteoarthritis of the midtarsal joints (06/17/2024). Pes planus foot shape.    Assessment:   1. Pronation of left foot   2. Pronation of right foot   3. Arthritis of midtarsal joints of both feet   4. Diabetic polyneuropathy associated with type 2 diabetes mellitus ([name])       Plan:  Patient was evaluated and treated and all questions answered.  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 Exacerbated by pes planus deformity, leading to increased stress on the joints. X-rays confirm moderate  osteoarthritis. Pain is sharp, primarily in the middle and balls of the feet. Joint injections are challenging due to anatomical location, and joint replacement is not an option. Topical NSAIDs like Voltaren  gel are considered safe due to minimal systemic absorption, avoiding adverse effects associated with oral NSAIDs. - Prescribe Voltaren  gel for topical use three to four times daily.  Rx was sent once again - Recommend Tylenol  Arthritis Strength for pain management. - Patient has not yet acquired diabetic shoes. He is interested in getting them. I sent in a new prescription for diabetic shoes and inserts. These are medically necessary and which should also reduce his ulceration risk with his diabetes early neuropathy and flatfoot deformity.  I expect this also will help with his midtarsal arthritis. - I discussed that if his A1C gets better controlled we can consider a diagnostic block of the DPN to assess if that improves his midfoot pain  Prentice Ovens, DPM  "

## 2024-10-13 ENCOUNTER — Ambulatory Visit (INDEPENDENT_AMBULATORY_CARE_PROVIDER_SITE_OTHER): Payer: Self-pay | Admitting: Nurse Practitioner

## 2024-10-13 ENCOUNTER — Encounter: Payer: Self-pay | Admitting: Hematology and Oncology

## 2024-10-13 ENCOUNTER — Encounter: Payer: Self-pay | Admitting: Nurse Practitioner

## 2024-10-13 VITALS — BP 131/68 | HR 65 | Ht 66.75 in | Wt 202.0 lb

## 2024-10-13 DIAGNOSIS — I10 Essential (primary) hypertension: Secondary | ICD-10-CM

## 2024-10-13 DIAGNOSIS — G44229 Chronic tension-type headache, not intractable: Secondary | ICD-10-CM

## 2024-10-13 DIAGNOSIS — R052 Subacute cough: Secondary | ICD-10-CM

## 2024-10-13 DIAGNOSIS — I7121 Aneurysm of the ascending aorta, without rupture: Secondary | ICD-10-CM

## 2024-10-13 MED ORDER — CARVEDILOL 12.5 MG PO TABS: 12.5000 mg | ORAL_TABLET | Freq: Two times a day (BID) | ORAL | 3 refills | Status: AC

## 2024-10-13 NOTE — Patient Instructions (Signed)
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
## 2024-10-14 ENCOUNTER — Encounter: Payer: Self-pay | Admitting: Neurology

## 2024-10-14 ENCOUNTER — Other Ambulatory Visit: Payer: Self-pay

## 2024-10-14 ENCOUNTER — Telehealth: Payer: Self-pay | Admitting: "Endocrinology

## 2024-10-14 ENCOUNTER — Other Ambulatory Visit: Payer: Self-pay | Admitting: "Endocrinology

## 2024-10-14 DIAGNOSIS — D509 Iron deficiency anemia, unspecified: Secondary | ICD-10-CM

## 2024-10-14 DIAGNOSIS — D649 Anemia, unspecified: Secondary | ICD-10-CM

## 2024-10-14 NOTE — Telephone Encounter (Signed)
 Pt has left a VM stating he has a question about a medicine he was prescribed that he can not afford

## 2024-10-14 NOTE — Telephone Encounter (Signed)
 I spoke with both the patient and the pharmacy. The pharmacist confirmed that the patient received Novolog  on 10/07/24, but Humalog  was later sent to the pharmacy because it is the insurance-preferred option. The patient refuses to use Humalog , stating he does not like it. The pharmacist noted that both medications are covered under the insurance for under $5; however, the patient does not want to pay anything and was advised to contact his insurance carrier regarding pricing. The patient mentioned he is switching insurance carriers to have a $0 copay and is confused about how he should be taking his medications. He also reported receiving a fourth medication, Humulin, which he says has preservatives removed and causes itching. Please clarify the following:  What is the correct medication regimen for this patient? Should the patient continue with Novolog , switch to Humalog , or use Humulin? How should dosing be managed given the changes and patient concerns?  The patient is upset and confused about the current plan and needs clear instructions.

## 2024-10-15 ENCOUNTER — Inpatient Hospital Stay: Attending: Oncology

## 2024-10-15 ENCOUNTER — Other Ambulatory Visit

## 2024-10-15 DIAGNOSIS — D509 Iron deficiency anemia, unspecified: Secondary | ICD-10-CM

## 2024-10-15 DIAGNOSIS — D649 Anemia, unspecified: Secondary | ICD-10-CM

## 2024-10-15 LAB — CBC WITH DIFFERENTIAL/PLATELET
Abs Immature Granulocytes: 0.01 K/uL (ref 0.00–0.07)
Basophils Absolute: 0 K/uL (ref 0.0–0.1)
Basophils Relative: 1 %
Eosinophils Absolute: 0.3 K/uL (ref 0.0–0.5)
Eosinophils Relative: 5 %
HCT: 36.9 % — ABNORMAL LOW (ref 39.0–52.0)
Hemoglobin: 12.2 g/dL — ABNORMAL LOW (ref 13.0–17.0)
Immature Granulocytes: 0 %
Lymphocytes Relative: 30 %
Lymphs Abs: 1.5 K/uL (ref 0.7–4.0)
MCH: 31 pg (ref 26.0–34.0)
MCHC: 33.1 g/dL (ref 30.0–36.0)
MCV: 93.7 fL (ref 80.0–100.0)
Monocytes Absolute: 0.3 K/uL (ref 0.1–1.0)
Monocytes Relative: 7 %
Neutro Abs: 2.9 K/uL (ref 1.7–7.7)
Neutrophils Relative %: 57 %
Platelets: 178 K/uL (ref 150–400)
RBC: 3.94 MIL/uL — ABNORMAL LOW (ref 4.22–5.81)
RDW: 13.8 % (ref 11.5–15.5)
WBC: 5 K/uL (ref 4.0–10.5)
nRBC: 0 % (ref 0.0–0.2)

## 2024-10-15 LAB — IRON AND TIBC
Iron: 82 ug/dL (ref 45–182)
Saturation Ratios: 29 % (ref 17.9–39.5)
TIBC: 283 ug/dL (ref 250–450)
UIBC: 201 ug/dL

## 2024-10-15 LAB — FERRITIN: Ferritin: 238 ng/mL (ref 24–336)

## 2024-10-16 LAB — ERYTHROPOIETIN: Erythropoietin: 21.1 m[IU]/mL — ABNORMAL HIGH (ref 2.6–18.5)

## 2024-10-22 ENCOUNTER — Ambulatory Visit: Admitting: Oncology

## 2024-10-22 ENCOUNTER — Inpatient Hospital Stay: Admitting: Oncology

## 2024-10-22 VITALS — BP 132/66 | HR 68 | Temp 97.6°F | Resp 18 | Wt 206.3 lb

## 2024-10-22 DIAGNOSIS — R718 Other abnormality of red blood cells: Secondary | ICD-10-CM | POA: Insufficient documentation

## 2024-10-22 DIAGNOSIS — M5441 Lumbago with sciatica, right side: Secondary | ICD-10-CM

## 2024-10-22 DIAGNOSIS — D649 Anemia, unspecified: Secondary | ICD-10-CM | POA: Diagnosis not present
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
## 2024-10-22 NOTE — Assessment & Plan Note (Signed)
 Likely secondary to IDA but will get renal ultrasound to rule out EPO secreting tumors. Patient does have fatty liver based on ultrasound from 05/07/2023.  He is followed by GI for this. Patient is scheduled for CT angio chest to evaluate his aorta on 11/01/2024.

## 2024-10-22 NOTE — Assessment & Plan Note (Addendum)
 Anemia is likely multifactorial due to chronic inflammation, iron deficiency and CKD. Labs from 10/15/2024 show hemoglobin 12.2, ferritin 238, elevated erythropoietin  level 21.1 and iron saturations of 29%. He received 2 doses of IV Feraheme  on 07/14/2024 and 07/21/2024. The remainder of his nutritional workup from 09/05/2024 was essentially unremarkable. He is unable to tolerate iron supplements due to constipation.  Continue B12 supplements daily. Will get renal ultrasound to evaluate kidneys given elevated erythropoietin  level.  He is also having right lower back pain. Recommend follow-up in about 3 months.

## 2024-10-25 ENCOUNTER — Encounter: Payer: Self-pay | Admitting: Dermatology

## 2024-10-25 ENCOUNTER — Ambulatory Visit (HOSPITAL_COMMUNITY)
Admission: RE | Admit: 2024-10-25 | Discharge: 2024-10-25 | Disposition: A | Discharge status: Home / Self Care | Source: Ambulatory Visit | Attending: Oncology | Admitting: Oncology

## 2024-10-25 DIAGNOSIS — D649 Anemia, unspecified: Secondary | ICD-10-CM | POA: Diagnosis present

## 2024-10-25 DIAGNOSIS — M5441 Lumbago with sciatica, right side: Secondary | ICD-10-CM | POA: Insufficient documentation

## 2024-10-25 DIAGNOSIS — R718 Other abnormality of red blood cells: Secondary | ICD-10-CM | POA: Insufficient documentation

## 2024-10-25 DIAGNOSIS — R1084 Generalized abdominal pain: Secondary | ICD-10-CM | POA: Insufficient documentation

## 2024-10-27 ENCOUNTER — Encounter: Payer: Self-pay | Admitting: Hematology and Oncology

## 2024-10-27 ENCOUNTER — Encounter: Payer: Self-pay | Admitting: Cardiology

## 2024-10-27 ENCOUNTER — Other Ambulatory Visit (HOSPITAL_COMMUNITY): Payer: Self-pay

## 2024-10-27 ENCOUNTER — Ambulatory Visit: Payer: Self-pay | Admitting: Oncology

## 2024-10-27 ENCOUNTER — Ambulatory Visit: Attending: Cardiology | Admitting: Cardiology

## 2024-10-27 VITALS — BP 153/82 | HR 70 | Ht 66.0 in | Wt 207.5 lb

## 2024-10-27 DIAGNOSIS — E782 Mixed hyperlipidemia: Secondary | ICD-10-CM

## 2024-10-27 DIAGNOSIS — I10 Essential (primary) hypertension: Secondary | ICD-10-CM

## 2024-10-27 DIAGNOSIS — Z794 Long term (current) use of insulin: Secondary | ICD-10-CM

## 2024-10-27 DIAGNOSIS — I2510 Atherosclerotic heart disease of native coronary artery without angina pectoris: Secondary | ICD-10-CM

## 2024-10-27 MED ORDER — SPIRONOLACTONE 25 MG PO TABS
25.0000 mg | ORAL_TABLET | ORAL | 3 refills | Status: AC
  Filled 2024-10-27: qty 90, 90d supply, fill #0

## 2024-10-27 MED ORDER — INVOKANA 100 MG PO TABS
100.0000 mg | ORAL_TABLET | Freq: Every day | ORAL | 0 refills | Status: AC
  Filled 2024-10-27: qty 30, 30d supply, fill #0

## 2024-10-27 NOTE — Patient Instructions (Addendum)
 Medication Instructions:  START  canagliflozin  (INVOKANA ) 100 MG TABS tablet         Take 1 tablet (100 mg total) by mouth daily before breakfast., Starting Wed 10/27/2024, Normal     spironolactone  (ALDACTONE ) 25 MG tablet        Take 1 tablet (25 mg total) by mouth every morning   *If you need a refill on your cardiac medications before your next appointment, please call your pharmacy*   Lab Work: (2 - 3 weeks)  Lab Orders         Basic metabolic panel with GFR     If you have labs (blood work) drawn today and your tests are completely normal, you will receive your results only by: MyChart Message (if you have MyChart) OR A paper copy in the mail If you have any lab test that is abnormal or we need to change your treatment, we will call you to review the results.   Follow-Up: At [facility], you and your health needs are our priority.  As part of our continuing mission to provide you with exceptional heart care, our providers are all part of one team.  This team includes your primary Cardiologist (physician) and Advanced Practice Providers or APPs (Physician Assistants and Nurse Practitioners) who all work together to provide you with the care you need, when you need it.  Your next appointment:   1 year(s)  Provider:   Gordy Bergamo, MD        We recommend signing up for the patient portal called MyChart.  Patients are able to view lab/test results, encounter notes, upcoming appointments, etc.  Non-urgent messages can be sent to your provider as well, go to [email or web address].

## 2024-10-27 NOTE — Progress Notes (Signed)
 Hey we let this patient know that renal ultrasound is unremarkable.  Delon Hope, AGNP-C Department of Hematology/Oncology [facility]  Phone: [phone]  10/27/2024 3:36 PM

## 2024-10-27 NOTE — Progress Notes (Signed)
 " Cardiology Office Note:  .   Date:  10/27/2024  [name], DOB [birth year], MRN [id number] PCP: Bevely Doffing, FNP  [facility] Providers Cardiologist:  Gordy Bergamo, MD   History of Present Illness: .   Ryan Herrera is a 79 y.o. male patient with mild obesity, uncontrolled diabetes mellitus, mixed hypercholesterolemia, primary hypertension, nonalcoholic fatty liver, CAD s/p CABG x 3 in 2008 with re-do CABG at [facility] 03/28/2022 with left RA-RI and SVG-PDA, and had patent LIMA to LAD from 2008. Admitted to the hospital on 09/05/2024 and discharged the following day after ruling out for myocardial infarction and chest pain felt to be due to GERD.  He has chronic dyspnea and mild ankle edema.  He has had a low risk nuclear stress test on 11/18/2023 and echocardiogram essentially normal on 10/31/2023 and also on 09/05/2024.  He remains asymptomatic except for gradually decreased exercise tolerance.    Discussed the use of AI scribe software for clinical note transcription with the patient, who gave verbal consent to proceed.  History of Present Illness Ryan Herrera is an 79 year old male with diabetes and coronary artery disease who presents for cardiovascular follow-up.  He was recently hospitalized after overeating at Thanksgiving, initially thought to be acid reflux. He now has shortness of breath and limited walking due to pain in his lower legs and feet.  He has diabetes with A1c 10. He cannot tolerate GLP-1 agonists due to severe reactions with vomiting. Jardiance  was stopped by his nephrologist and he is not on SGLT2 inhibitors due to bothersome urinary frequency. He takes glipizide  twice daily, with blood sugar varying by diet.  He has significant lower leg and foot pain that limits mobility, contributes to weight gain, and prevents exercise. He reports being 27 pounds overweight. Podiatry will not treat his arthritis until his A1c is below 7.  He has  hypertension. Olmesartan  was stopped because of a chronic dry cough. He is on carvedilol , recently increased to 12.5 mg twice daily, with generally well-controlled blood pressure at home.  He has coronary artery disease. A stress test in February 2025 and echocardiogram in November 2025 were normal. He has no current angina and takes aspirin  81 mg daily.  He reports financial stress with limited income and high medical costs, which affects his ability to afford medications. He lives with his partner Arland on a fixed income.  Cardiac Studies relevent.    CARDIAC CATHETERIZATION 03/25/2022    MYOCARDIAL PERFUSION IMAGING 11/18/2023 No evidence of ischemia, normal LVEF at 59%, low risk. Discussed the use of AI scribe software for clinical note transcription with the patient, who gave verbal consent to proceed.   CXR 11/22/2023: Stable heart size post median sternotomy. Aortic atherosclerosis. Mild lingular scarring. Mild chronic bronchial thickening. No acute airspace disease. Normal pulmonary vasculature. No pleural fluid or pneumothorax thoracic spondylosis  ECHOCARDIOGRAM COMPLETE 09/05/2024  1. Left ventricular ejection fraction, by estimation, is 60 to 65%. The left ventricle has normal function. The left ventricle has no regional wall motion abnormalities. Left ventricular diastolic parameters are consistent with Grade II diastolic dysfunction (pseudonormalization). Elevated left atrial pressure. 2. Right ventricular systolic function is low normal. The right ventricular size is normal. There is normal pulmonary artery systolic pressure. 3. Left atrial size was moderately dilated. 4. The mitral valve is normal in structure. Trivial mitral valve regurgitation. No evidence of mitral stenosis. 5. The aortic valve is tricuspid. There is moderate calcification of the aortic  valve. There is moderate thickening of the aortic valve. Aortic valve regurgitation is not visualized. Aortic valve  sclerosis/calcification is present, without any evidence of aortic stenosis.  EKG:      Labs   Lab Results  Component Value Date   CHOL 84 09/06/2024   HDL 37 (L) 09/06/2024   LDLCALC 23 09/06/2024   TRIG 122 09/06/2024   CHOLHDL 2.3 09/06/2024   Lipoprotein (a)  Date/Time Value Ref Range Status  03/23/2022 01:10 AM 119.4 (H) <75.0 nmol/L Final    Comment:    (NOTE) Note:  Values greater than or equal to 75.0 nmol/L may       indicate an independent risk factor for CHD,       but must be evaluated with caution when applied       to non-Caucasian populations due to the       influence of genetic factors on Lp(a) across       ethnicities. Performed At: [facility] [address] [place], [place] [id number] Jennette Shorter MD Ey:[phone]     Recent Labs    09/05/24 0630 09/06/24 1336 09/21/24 1419  NA 135 135 134  K 3.9 4.2 4.6  CL 100 100 97  CO2 25 25 23   GLUCOSE 304* 197* 215*  BUN 18 16 14   CREATININE 1.14 1.12 1.18  CALCIUM  9.1 9.1 9.5  GFRNONAA >60 >60  --     Lab Results  Component Value Date   ALT 47 (H) 09/05/2024   AST 31 09/05/2024   ALKPHOS 99 09/05/2024   BILITOT 0.3 09/05/2024      Latest Ref Rng & Units 10/15/2024    1:04 PM 09/21/2024    2:19 PM 09/06/2024    1:36 PM  CBC  WBC 4.0 - 10.5 K/uL 5.0  6.2  6.0   Hemoglobin 13.0 - 17.0 g/dL 87.7  86.4  87.5   Hematocrit 39.0 - 52.0 % 36.9  40.8  37.0   Platelets 150 - 400 K/uL 178  198  145    Lab Results  Component Value Date   HGBA1C 10.0 (H) 09/05/2024    Lab Results  Component Value Date   TSH 6.480 (H) 03/22/2024     ROS  Review of Systems  Cardiovascular:  Positive for dyspnea on exertion (dyspnea) and leg swelling (chronic). Negative for chest pain.   Physical Exam:   VS:  BP (!) 153/82 (BP Location: Left Arm, Patient Position: Sitting)   Pulse 70   Ht 5' 6 (1.676 m)   Wt 207 lb 8 oz (94.1 kg)   SpO2 98%   BMI 33.49 kg/m    Wt Readings from Last 3 Encounters:   10/27/24 207 lb 8 oz (94.1 kg)  10/22/24 206 lb 4.8 oz (93.6 kg)  10/13/24 202 lb (91.6 kg)    BP Readings from Last 3 Encounters:  10/27/24 (!) 153/82  10/22/24 132/66  10/13/24 131/68   Physical Exam Neck:     Vascular: No carotid bruit or JVD.  Cardiovascular:     Rate and Rhythm: Normal rate and regular rhythm.     Pulses: Intact distal pulses.     Heart sounds: S1 normal and S2 normal. Murmur heard.     Early systolic murmur is present with a grade of 2/6 at the upper right sternal border.     No gallop.  Pulmonary:     Effort: Pulmonary effort is normal.     Breath sounds: Normal breath sounds.  Abdominal:     General: Bowel sounds are normal.     Palpations: Abdomen is soft.  Musculoskeletal:     Right lower leg: Edema (2+ ankle edema) present.     Left lower leg: Edema (2+ ankle edema) present.     ASSESSMENT AND PLAN: .      ICD-10-CM   1. Coronary artery disease involving native coronary artery of native heart without angina pectoris  I25.10     2. Type 2 diabetes mellitus with hyperglycemia, with long-term current use of insulin  ([name])  E11.65 canagliflozin  (INVOKANA ) 100 MG TABS tablet   Z79.4     3. Primary hypertension  I10 spironolactone  (ALDACTONE ) 25 MG tablet    Basic metabolic panel with GFR    4. Mixed hyperlipidemia  E78.2      Assessment & Plan Coronary artery disease S/P re-do CABG at [facility] 03/28/2022 with left RA-RI and SVG-PDA, and had patent LIMA to LAD from 2008.  Well-managed with no evidence of ischemia or significant abnormalities on recent stress test and echocardiogram. LDL is well-controlled at 23 mg/dL with current medication regimen. - Continue current medications including aspirin  81 mg daily. - Proceed with scheduled CT scan next week.  Type 2 diabetes mellitus with hyperglycemia Type 2 diabetes with hyperglycemia, A1c is 10%. Previous intolerance to GLP-1 agonists due to severe gastrointestinal side effects. Current regimen  includes glipizide , which is effective in controlling blood sugar levels. Discussion about starting Invokana  (canagliflozin ) as an adjunct therapy to aid in weight management and glycemic control. He is hesitant due to previous experiences with similar medications due to frequency of urination but agrees to try Invokana  for one month. - Prescribed Invokana  100 mg once daily for one month. - Continue glipizide  as currently prescribed. - Sent a note to endocrinologist regarding initiation of Invokana . - Weight loss, diet discussed extensively with the patient.  Primary hypertension Managed with carvedilol . Blood pressure is generally well-controlled at home, but stress and physical exertion can cause transient elevations. Discussion about adding spironolactone  to further control blood pressure and reduce leg swelling. He is agreeable to trying spironolactone  as it does not cause cough and has minimal side effects. - Prescribed spironolactone  as an adjunct to carvedilol . - Ordered BMP in 2-3 weeks to monitor kidney function and potassium levels. - Follow-up with PCP for further management of hypertension.  Patient has multiple medication intolerances and allergies. - Hopefully addition of Invokana  and spironolactone  will improve blood pressure control and also improve his leg edema.  No clinical evidence of heart failure.  Excess calories and poor diet also contributing.  Mixed hyperlipidemia Well-controlled with current medications. LDL is at 23 mg/dL, and LPA is high but managed with Repatha  and Zetia . - Continue current lipid-lowering therapy with Zetia  and Repatha .    Follow up: 1 year or sooner if problems.  Signed,  Gordy Bergamo, MD, [facility] 10/27/2024, 6:18 PM [facility] [address] [place], [place] [address] Phone: [phone]. Fax:  [phone]  "

## 2024-10-28 ENCOUNTER — Encounter: Payer: Self-pay | Admitting: Dermatology

## 2024-10-28 ENCOUNTER — Other Ambulatory Visit (HOSPITAL_COMMUNITY): Payer: Self-pay

## 2024-10-28 ENCOUNTER — Ambulatory Visit: Admitting: Dermatology

## 2024-10-28 ENCOUNTER — Other Ambulatory Visit: Payer: Self-pay | Admitting: *Deleted

## 2024-10-28 ENCOUNTER — Telehealth: Payer: Self-pay | Admitting: *Deleted

## 2024-10-28 ENCOUNTER — Encounter: Payer: Self-pay | Admitting: Hematology and Oncology

## 2024-10-28 VITALS — BP 182/78

## 2024-10-28 DIAGNOSIS — L814 Other melanin hyperpigmentation: Secondary | ICD-10-CM

## 2024-10-28 DIAGNOSIS — R1084 Generalized abdominal pain: Secondary | ICD-10-CM

## 2024-10-28 DIAGNOSIS — D099 Carcinoma in situ, unspecified: Secondary | ICD-10-CM

## 2024-10-28 DIAGNOSIS — L578 Other skin changes due to chronic exposure to nonionizing radiation: Secondary | ICD-10-CM | POA: Diagnosis not present

## 2024-10-28 DIAGNOSIS — D0461 Carcinoma in situ of skin of right upper limb, including shoulder: Secondary | ICD-10-CM

## 2024-10-28 MED ORDER — MUPIROCIN 2 % EX OINT: 1.0000 | TOPICAL_OINTMENT | Freq: Two times a day (BID) | CUTANEOUS | 1 refills | Status: AC

## 2024-10-28 NOTE — Progress Notes (Signed)
 "  Follow-Up Visit   Subjective  Ryan Herrera is a 79 y.o. male who presents for the following: Mohs of a Squamous Cell Carcinoma in Situ on the right hand- posterior. Lesion was biopsied on 09/01/2024. Patient is accompanied by his wife.   The following portions of the chart were reviewed this encounter and updated as appropriate: medications, allergies, medical history  Review of Systems:  No other skin or systemic complaints except as noted in HPI or Assessment and Plan.  Objective  Well appearing patient in no apparent distress; mood and affect are within normal limits.  A focused examination was performed of the following areas: Right hand Relevant physical exam findings are noted in the Assessment and Plan.   right hand- posterior Pink itchy nodule   Assessment & Plan   SQUAMOUS CELL CARCINOMA IN SITU right hand- posterior - Mohs surgery  Consent obtained: written  Anticoagulation: Was the anticoagulation regimen changed prior to Mohs? No    Anesthesia: Anesthesia method: local infiltration Local anesthetic: lidocaine  1% WITH epi  Procedure Details: Timeout: pre-procedure verification complete Procedure Prep: patient was prepped and draped in usual sterile fashion Prep type: chlorhexidine  Biopsy accession number: [id number] Biopsy lab: [facility] Date of biopsy: 09/01/2024 Frozen section biopsy performed: No   Pre-Op diagnosis: squamous cell carcinoma SCC subtype: in situ MohsAIQ Surgical site (if tumor spans multiple areas, please select predominant area): hand Surgery side: right Surgical site (from skin exam): right hand- posterior Pre-operative length (cm): 0.8 Pre-operative width (cm): 0.8 Indications for Mohs surgery: anatomic location where tissue conservation is critical Previously treated? No    Micrographic Surgery Details: Post-operative length (cm): 1.2 Post-operative width (cm): 1.2 Number of Mohs stages: 1 Post surgery depth  of defect: subcutaneous fat  Stage 1    Tumor features identified on Mohs section: no tumor identified  Reconstruction: Was the defect reconstructed?: No    This Visit - mupirocin  ointment (BACTROBAN ) 2 % - Apply 1 Application topically 2 (two) times daily.   Return in about 4 weeks (around 11/25/2024) for wound check.  LILLETTE Rollene Gobble, RN, am acting as scribe for Ryan CHRISTELLA HOLY, MD .   10/28/2024  HISTORY OF PRESENT ILLNESS  Ryan Herrera is seen in consultation at the request of Dr. Holy for biopsy-proven Squamous Carcinoma in Situ of the right dorsal hand. They note that the area has been present for about 1 year increasing in size with time.  There is no history of previous treatment.  Reports no other new or changing lesions and has no other complaints today. Accompanied by his wife.   Medications and allergies: see patient chart.  Review of systems: Reviewed 8 systems and notable for the above skin cancer.  All other systems reviewed are unremarkable/negative, unless noted in the HPI. Past medical history, surgical history, family history, social history were also reviewed and are noted in the chart/questionnaire.    PHYSICAL EXAMINATION  General: Well-appearing, in no acute distress, alert and oriented x 4. Vitals reviewed in chart (if available).   Skin: Exam reveals a 0.8 x 0.8  cm erythematous papule and biopsy scar on the right dorsal hand. There are rhytids, telangiectasias, and lentigines, consistent with photodamage.  Biopsy report(s) reviewed, confirming the diagnosis.   ASSESSMENT  1) Squamous Carcinoma in Situ of the right dorsal hand 2) photodamage 3) solar lentigines   PLAN   1. Due to location, size, histology, or recurrence and the likelihood of subclinical extension as well as  the need to conserve normal surrounding tissue, the patient was deemed acceptable for Mohs micrographic surgery (MMS).  The nature and purpose of the procedure,  associated benefits and risks including recurrence and scarring, possible complications such as pain, infection, and bleeding, and alternative methods of treatment if appropriate were discussed with the patient during consent. The lesion location was verified by the patient, by reviewing previous notes, pathology reports, and by photographs as well as angulation measurements if available.  Informed consent was reviewed and signed by the patient, and timeout was performed at 10:00 AM. See op note below.  2. For the photodamage and solar lentigines, sun protection discussed/information given on OTC sunscreens, and we recommend continued regular follow-up with primary dermatologist every 6 months or sooner for any growing, bleeding, or changing lesions. 3. Prognosis and future surveillance discussed. 4. Letter with treatment outcome sent to referring provider. 5. Pain acetaminophen /ibuprofen  MOHS MICROGRAPHIC SURGERY AND RECONSTRUCTION  Initial size:   0.8 x 0.8 cm Surgical defect/wound size: 1.2 x 1.2 cm Anesthesia:    0.33% lidocaine  with 1:200,000 epinephrine  EBL:    <5 mL Complications:  None Repair type:   Second Intention  Stages: 1  STAGE I: Anesthesia achieved with 0.5% lidocaine  with 1:200,000 epinephrine . ChloraPrep applied. 1 section(s) excised using Mohs technique (this includes total peripheral and deep tissue margin excision and evaluation with frozen sections, excised and interpreted by the same physician). The tumor was first debulked and then excised with an approx. 2mm margin.  Hemostasis was achieved with electrocautery as needed.  The specimen was then oriented, subdivided/relaxed, inked, and processed using Mohs technique.    Frozen section analysis revealed a clear deep and peripheral margin.  Reconstruction  Patient was notified of results and repair options were discussed, including second intention healing. After reviewing the advantages and disadvantages of each, we  agreed on second intention healing as appropriate.   The surgical site was then lightly scrubbed with sterile, saline-soaked gauze.  The area was bandaged using Vaseline ointment, non-adherent gauze, gauze pads, and tape to provide an adequate pressure dressing.   The patient tolerated the procedure well, was given detailed written and verbal wound care instructions, and was discharged in good condition.  The patient will follow-up in 4 weeks and as scheduled with primary dermatologist.   Documentation: I have reviewed the above documentation for accuracy and completeness, and I agree with the above.  Ryan CHRISTELLA HOLY, MD "

## 2024-10-28 NOTE — Progress Notes (Signed)
 Well if he would like to be evaluated for abdominal pain I am happy to put an order in for an abdominal ultrasound.  Delon Hope, AGNP-C Department of Hematology/Oncology [facility]  Phone: [phone]  10/28/2024 9:43 AM

## 2024-10-28 NOTE — Progress Notes (Signed)
 That would have been an abdominal ultrasound.  Is he still having pain?  Delon Hope, AGNP-C Department of Hematology/Oncology [facility]  Phone: [phone]  10/28/2024 8:29 AM

## 2024-10-28 NOTE — Progress Notes (Signed)
 LM to return call.

## 2024-10-28 NOTE — Telephone Encounter (Signed)
 Results of renal US  called to patient per request of Delon Hope, NP.  Upon speaking with him, he states that he was under the impression that US  would include liver and pancreas.  Rational behind renal US  explained and that study only evaluates the kidney, however patient now states that he is having abdominal pain in addition to right lower back pain.  Discussed with Delon and US  Abdomen ordered and scheduled for him.

## 2024-10-28 NOTE — Progress Notes (Signed)
 Patient made aware and order placed.

## 2024-10-28 NOTE — Patient Instructions (Signed)

## 2024-11-01 ENCOUNTER — Ambulatory Visit (HOSPITAL_COMMUNITY)

## 2024-11-02 ENCOUNTER — Other Ambulatory Visit: Payer: Self-pay

## 2024-11-02 ENCOUNTER — Telehealth: Payer: Self-pay

## 2024-11-02 DIAGNOSIS — E1159 Type 2 diabetes mellitus with other circulatory complications: Secondary | ICD-10-CM

## 2024-11-02 NOTE — Telephone Encounter (Unsigned)
 Copied from CRM #[id number]. Topic: Referral - Question >> Nov 02, 2024 11:38 AM Gattis SQUIBB wrote: Reason for CRM: pt called needing a referral for his eye exam for glasses yearly.. he has [employer] ins.  He has an appt today .  [facility] [employer] #  [phone]  [phone]

## 2024-11-02 NOTE — Telephone Encounter (Signed)
 Referral placed

## 2024-11-03 ENCOUNTER — Other Ambulatory Visit: Payer: Self-pay | Admitting: Internal Medicine

## 2024-11-03 DIAGNOSIS — B356 Tinea cruris: Secondary | ICD-10-CM

## 2024-11-04 ENCOUNTER — Ambulatory Visit (HOSPITAL_COMMUNITY)
Admission: RE | Admit: 2024-11-04 | Discharge: 2024-11-04 | Disposition: A | Discharge status: Home / Self Care | Source: Ambulatory Visit | Attending: Oncology | Admitting: Oncology

## 2024-11-04 ENCOUNTER — Ambulatory Visit (INDEPENDENT_AMBULATORY_CARE_PROVIDER_SITE_OTHER): Admitting: Podiatry

## 2024-11-04 ENCOUNTER — Ambulatory Visit (HOSPITAL_COMMUNITY)
Admission: RE | Admit: 2024-11-04 | Discharge: 2024-11-04 | Disposition: A | Discharge status: Home / Self Care | Source: Ambulatory Visit | Attending: Nurse Practitioner | Admitting: Nurse Practitioner

## 2024-11-04 ENCOUNTER — Encounter: Payer: Self-pay | Admitting: Podiatry

## 2024-11-04 DIAGNOSIS — I7121 Aneurysm of the ascending aorta, without rupture: Secondary | ICD-10-CM | POA: Diagnosis present

## 2024-11-04 DIAGNOSIS — M79674 Pain in right toe(s): Secondary | ICD-10-CM | POA: Diagnosis not present

## 2024-11-04 DIAGNOSIS — M1710 Unilateral primary osteoarthritis, unspecified knee: Secondary | ICD-10-CM | POA: Diagnosis present

## 2024-11-04 DIAGNOSIS — E1142 Type 2 diabetes mellitus with diabetic polyneuropathy: Secondary | ICD-10-CM | POA: Diagnosis not present

## 2024-11-04 DIAGNOSIS — B351 Tinea unguium: Secondary | ICD-10-CM

## 2024-11-04 DIAGNOSIS — M79675 Pain in left toe(s): Secondary | ICD-10-CM

## 2024-11-04 DIAGNOSIS — R1084 Generalized abdominal pain: Secondary | ICD-10-CM | POA: Insufficient documentation

## 2024-11-04 LAB — POCT I-STAT CREATININE: Creatinine, Ser: 1.4 mg/dL — ABNORMAL HIGH (ref 0.61–1.24)

## 2024-11-04 MED ORDER — IOHEXOL 350 MG/ML SOLN
80.0000 mL | Freq: Once | INTRAVENOUS | Status: AC | PRN
  Administered 2024-11-04: 75 mL via INTRAVENOUS

## 2024-11-04 NOTE — Progress Notes (Signed)
This patient returns to my office for at risk foot care.  This patient requires this care by a professional since this patient will be at risk due to having diabetes.  This patient is unable to cut nails himself since the patient cannot reach his nails.These nails are painful walking and wearing shoes.  This patient presents for at risk foot care today.  General Appearance  Alert, conversant and in no acute stress.  Vascular  Dorsalis pedis and posterior tibial  pulses are palpable  bilaterally.  Capillary return is within normal limits  bilaterally. Temperature is within normal limits  bilaterally.  Neurologic  Senn-Weinstein monofilament wire test within normal limits  bilaterally. Muscle power within normal limits bilaterally.  Nails Thick disfigured discolored nails with subungual debris  from hallux to fifth toes bilaterally. No evidence of bacterial infection or drainage bilaterally.  Orthopedic  No limitations of motion  feet .  No crepitus or effusions noted.  No bony pathology or digital deformities noted.  Skin  normotropic skin with no porokeratosis noted bilaterally.  No signs of infections or ulcers noted.     Onychomycosis  Pain in right toes  Pain in left toes  Consent was obtained for treatment procedures.   Mechanical debridement of nails 1-5  bilaterally performed with a nail nipper.  Filed with dremel without incident.    Return office visit  12 weeks                    Told patient to return for periodic foot care and evaluation due to potential at risk complications.   Joyice Magda DPM  

## 2024-11-07 ENCOUNTER — Encounter: Payer: Self-pay | Admitting: Hematology and Oncology

## 2024-11-08 ENCOUNTER — Ambulatory Visit: Payer: Self-pay | Admitting: Nurse Practitioner

## 2024-11-08 ENCOUNTER — Ambulatory Visit: Payer: Self-pay | Admitting: Oncology

## 2024-11-08 NOTE — Progress Notes (Signed)
 Abdominal ultrasound:  Patient was complaining of right lower back pain and generalized abdominal pain.  Will obtain abdominal ultrasound to further investigate.  Delon Hope, AGNP-C Department of Hematology/Oncology [facility] at [facility]  Phone: [phone]  11/08/2024 12:18 PM

## 2024-11-10 ENCOUNTER — Encounter: Payer: Self-pay | Admitting: Hematology and Oncology

## 2024-11-10 ENCOUNTER — Ambulatory Visit: Admitting: Dermatology

## 2024-11-10 ENCOUNTER — Encounter: Payer: Self-pay | Admitting: Dermatology

## 2024-11-10 VITALS — BP 139/81 | HR 61

## 2024-11-10 DIAGNOSIS — D229 Melanocytic nevi, unspecified: Secondary | ICD-10-CM

## 2024-11-10 DIAGNOSIS — L814 Other melanin hyperpigmentation: Secondary | ICD-10-CM | POA: Diagnosis not present

## 2024-11-10 DIAGNOSIS — Z1283 Encounter for screening for malignant neoplasm of skin: Secondary | ICD-10-CM | POA: Diagnosis not present

## 2024-11-10 DIAGNOSIS — L821 Other seborrheic keratosis: Secondary | ICD-10-CM

## 2024-11-10 DIAGNOSIS — L578 Other skin changes due to chronic exposure to nonionizing radiation: Secondary | ICD-10-CM | POA: Diagnosis not present

## 2024-11-10 DIAGNOSIS — W908XXA Exposure to other nonionizing radiation, initial encounter: Secondary | ICD-10-CM | POA: Diagnosis not present

## 2024-11-10 DIAGNOSIS — D099 Carcinoma in situ, unspecified: Secondary | ICD-10-CM

## 2024-11-10 DIAGNOSIS — D1801 Hemangioma of skin and subcutaneous tissue: Secondary | ICD-10-CM | POA: Diagnosis not present

## 2024-11-10 DIAGNOSIS — Z85828 Personal history of other malignant neoplasm of skin: Secondary | ICD-10-CM

## 2024-11-10 DIAGNOSIS — T1490XD Injury, unspecified, subsequent encounter: Secondary | ICD-10-CM

## 2024-11-10 DIAGNOSIS — L739 Follicular disorder, unspecified: Secondary | ICD-10-CM | POA: Diagnosis not present

## 2024-11-10 DIAGNOSIS — L905 Scar conditions and fibrosis of skin: Secondary | ICD-10-CM

## 2024-11-10 NOTE — Progress Notes (Signed)
 "  Total Body Skin Exam (TBSE) Visit   History of Present Illness Ryan Herrera is an 79 year old male who presents with an itchy spot on his belly and TBSE.  He has an itchy spot on his belly that has persisted for over two weeks. Initially, he thought it was a bite. He describes the spot as possibly being folliculitis. He used mupirocin  for the first week and then switched to Vaseline. Despite these treatments, the spot continued to itch. After scratching it, a hard, plastic-like piece came out, which he lost.  He has a spot that he has had for years, which does not bother him. 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 He has been advised in the past to avoid Neosporin and instead use Polysporin or Vaseline, and he stopped using Neosporin after a previous biopsy.  Patient presents today for follow up visit for TBSE. Patient was last evaluated on 10/28/24 . Patient denies medication changes. Patient reports he  does have spots, moles and lesions of concern to be evaluated. Patient reports throughout his lifetime he  has had severe sun exposure. Currently, patient reports if he  has excessive sun exposure, he  does not apply sunscreen and/or wears protective coverings. Patient reports he  has hx of bx. Patient denies  family history of skin cancers.  He is s/p Mohs for an Squamous Cell Carcinoma in Situ on the right hand, treated on 10/28/2024, healing by 2nd intention.  The following portions of the chart were reviewed this encounter and updated as appropriate: medications, allergies, medical history  Review of Systems:  No other skin or systemic complaints except as noted in HPI or Assessment and Plan.  Objective  Well appearing patient in no apparent distress; mood and affect are within normal limits.  A full examination was performed including scalp, head, eyes, ears, nose, lips, neck, chest, axillae, abdomen, back,  buttocks, bilateral upper extremities, bilateral lower extremities, hands, feet, fingers, toes, fingernails, and toenails. All findings within normal limits unless otherwise noted below.   Relevant physical exam findings are noted in the Assessment and Plan.       Assessment & Plan   LENTIGINES, SEBORRHEIC KERATOSES, HEMANGIOMAS - Benign normal skin lesions - Benign-appearing - Call for any changes  MELANOCYTIC NEVI - Tan-brown and/or pink-flesh-colored symmetric macules and papules - Benign appearing on exam today - Observation - Call clinic for new or changing moles - Recommend daily use of broad spectrum spf 30+ sunscreen to sun-exposed areas.   ACTINIC DAMAGE - Chronic condition, secondary to cumulative UV/sun exposure - diffuse scaly erythematous macules with underlying dyspigmentation - Recommend daily broad spectrum sunscreen SPF 30+ to sun-exposed areas, reapply every 2 hours as needed.  - Staying in the shade or wearing long sleeves, sun glasses (UVA+UVB protection) and wide brim hats (4-inch brim around the entire circumference of the hat) are also recommended for sun protection.  - Call for new or changing lesions.  SKIN CANCER SCREENING PERFORMED TODAY.  HISTORY OF SQUAMOUS CELL CARCINOMA OF THE SKIN - No evidence of recurrence today - Recommend regular full body skin exams - Recommend daily broad spectrum sunscreen SPF 30+ to sun-exposed areas, reapply every 2 hours as needed.  - Call if any new or changing lesions are noted between office visits  FOLLICULITIS - left abdomen  Exam: Perifollicular erythematous papules and pustules  Folliculitis occurs due to inflammation of the superficial hair follicle (pore), resulting  in acne-like lesions (pus bumps). It can be infectious (bacterial, fungal) or noninfectious (shaving, tight clothing, heat/sweat, medications).  Folliculitis can be acute or chronic and recommended treatment depends on the underlying cause of  folliculitis.  Treatment Plan: Mupirocin  to lesion daily  Healing Wound s/p Mohs for SCC on the right hand, treated on 10/29/2023,  healing by second intetion - Reassured that wound has healed well - Discussed that scars take up to 12 months to mature from the date of surgery - Recommend SPF 30+ to scar daily to prevent purple color - OK to start scar massage at 4-6 weeks post-op - Can consider silicone based products for scar healing  Return in about 6 months (around 05/10/2025) for TBSE.  I, Doyce Pan, CMA, am acting as scribe for Ryan CHRISTELLA HOLY, MD.   Documentation: I have reviewed the above documentation for accuracy and completeness, and I agree with the above.  Ryan CHRISTELLA HOLY, MD   "

## 2024-11-10 NOTE — Patient Instructions (Signed)

## 2024-11-10 NOTE — Progress Notes (Signed)
 Great thanks.   If he needs a referral to somebody locally let me know.  Delon Hope, AGNP-C Department of Hematology/Oncology [facility] at [facility]  Phone: [phone]  11/10/2024 9:59 AM

## 2024-11-12 ENCOUNTER — Encounter: Payer: Self-pay | Admitting: "Endocrinology

## 2024-11-30 ENCOUNTER — Ambulatory Visit: Admitting: Dermatology

## 2024-12-09 ENCOUNTER — Ambulatory Visit: Admitting: "Endocrinology

## 2024-12-10 ENCOUNTER — Ambulatory Visit: Payer: Self-pay | Admitting: Neurology

## 2024-12-13 ENCOUNTER — Ambulatory Visit

## 2024-12-14 ENCOUNTER — Ambulatory Visit

## 2025-01-10 ENCOUNTER — Inpatient Hospital Stay

## 2025-01-13 ENCOUNTER — Ambulatory Visit: Admitting: Podiatry

## 2025-01-17 ENCOUNTER — Inpatient Hospital Stay: Admitting: Oncology

## 2025-03-22 ENCOUNTER — Ambulatory Visit: Payer: Self-pay

## 2025-05-19 ENCOUNTER — Ambulatory Visit: Admitting: Dermatology
